# Patient Record
Sex: Female | Born: 1945
Health system: Southern US, Community
[De-identification: ages and names within clinical notes are randomized; demographics above are authoritative.]

## PROBLEM LIST (undated history)

## (undated) DIAGNOSIS — K219 Gastro-esophageal reflux disease without esophagitis: Secondary | ICD-10-CM

## (undated) DIAGNOSIS — I639 Cerebral infarction, unspecified: Secondary | ICD-10-CM

## (undated) DIAGNOSIS — J40 Bronchitis, not specified as acute or chronic: Secondary | ICD-10-CM

## (undated) DIAGNOSIS — I509 Heart failure, unspecified: Secondary | ICD-10-CM

## (undated) DIAGNOSIS — I270 Primary pulmonary hypertension: Secondary | ICD-10-CM

## (undated) DIAGNOSIS — I219 Acute myocardial infarction, unspecified: Secondary | ICD-10-CM

## (undated) DIAGNOSIS — Z8679 Personal history of other diseases of the circulatory system: Secondary | ICD-10-CM

## (undated) DIAGNOSIS — I2721 Secondary pulmonary arterial hypertension: Secondary | ICD-10-CM

## (undated) DIAGNOSIS — I209 Angina pectoris, unspecified: Secondary | ICD-10-CM

## (undated) DIAGNOSIS — G4730 Sleep apnea, unspecified: Secondary | ICD-10-CM

## (undated) DIAGNOSIS — R7303 Prediabetes: Secondary | ICD-10-CM

## (undated) DIAGNOSIS — J45909 Unspecified asthma, uncomplicated: Secondary | ICD-10-CM

## (undated) DIAGNOSIS — I10 Essential (primary) hypertension: Secondary | ICD-10-CM

## (undated) DIAGNOSIS — E669 Obesity, unspecified: Secondary | ICD-10-CM

## (undated) DIAGNOSIS — G4733 Obstructive sleep apnea (adult) (pediatric): Secondary | ICD-10-CM

## (undated) DIAGNOSIS — E7800 Pure hypercholesterolemia, unspecified: Secondary | ICD-10-CM

## (undated) DIAGNOSIS — I499 Cardiac arrhythmia, unspecified: Secondary | ICD-10-CM

## (undated) DIAGNOSIS — IMO0001 Reserved for inherently not codable concepts without codable children: Secondary | ICD-10-CM

## (undated) DIAGNOSIS — I2510 Atherosclerotic heart disease of native coronary artery without angina pectoris: Secondary | ICD-10-CM

## (undated) HISTORY — DX: Personal history of other diseases of the circulatory system: Z86.79

## (undated) HISTORY — DX: Unspecified asthma, uncomplicated: J45.909

## (undated) HISTORY — PX: CARPAL TUNNEL RELEASE: SHX101

## (undated) HISTORY — DX: Secondary pulmonary arterial hypertension: I27.21

## (undated) HISTORY — DX: Primary pulmonary hypertension: I27.0

## (undated) HISTORY — PX: JOINT REPLACEMENT: SHX530

## (undated) HISTORY — DX: Obstructive sleep apnea (adult) (pediatric): G47.33

## (undated) HISTORY — DX: Heart failure, unspecified: I50.9

## (undated) HISTORY — PX: CARDIAC CATHETERIZATION: SHX172

## (undated) HISTORY — PX: UVULOPALATOPHARYNGOPLASTY: SHX827

## (undated) HISTORY — PX: OTHER SURGICAL HISTORY: SHX169

## (undated) HISTORY — PX: KNEE ARTHROSCOPY AND ARTHROTOMY: SUR84

## (undated) HISTORY — PX: HYSTEROSCOPY: SHX211

## (undated) HISTORY — PX: DILATION AND CURETTAGE OF UTERUS: SHX78

## (undated) HISTORY — DX: Atherosclerotic heart disease of native coronary artery without angina pectoris: I25.10

## (undated) HISTORY — PX: TONSILLECTOMY: SUR1361

## (undated) HISTORY — PX: FOOT SURGERY: SHX648

---

## 1998-10-31 HISTORY — PX: COLONOSCOPY: SHX174

## 1998-10-31 LAB — HM COLONOSCOPY

## 2004-07-13 ENCOUNTER — Ambulatory Visit: Payer: Self-pay | Admitting: Cardiovascular Disease

## 2004-07-18 ENCOUNTER — Ambulatory Visit: Payer: Self-pay | Admitting: Cardiovascular Disease

## 2004-10-09 ENCOUNTER — Ambulatory Visit: Payer: Self-pay | Admitting: Obstetrics and Gynecology

## 2004-10-13 ENCOUNTER — Ambulatory Visit: Payer: Self-pay | Admitting: Obstetrics and Gynecology

## 2006-01-15 ENCOUNTER — Ambulatory Visit: Payer: Self-pay

## 2006-09-27 ENCOUNTER — Ambulatory Visit: Payer: Self-pay | Admitting: Internal Medicine

## 2007-08-06 ENCOUNTER — Ambulatory Visit: Payer: Self-pay | Admitting: Obstetrics and Gynecology

## 2007-08-08 ENCOUNTER — Ambulatory Visit: Payer: Self-pay | Admitting: Obstetrics and Gynecology

## 2007-10-10 ENCOUNTER — Other Ambulatory Visit: Payer: Self-pay

## 2007-10-11 ENCOUNTER — Inpatient Hospital Stay: Payer: Self-pay | Admitting: Internal Medicine

## 2007-10-11 ENCOUNTER — Other Ambulatory Visit: Payer: Self-pay

## 2008-04-05 ENCOUNTER — Ambulatory Visit: Payer: Self-pay | Admitting: Internal Medicine

## 2008-04-05 ENCOUNTER — Ambulatory Visit: Payer: Self-pay | Admitting: General Practice

## 2008-04-19 ENCOUNTER — Inpatient Hospital Stay: Payer: Self-pay | Admitting: General Practice

## 2009-05-12 ENCOUNTER — Emergency Department: Payer: Self-pay | Admitting: Emergency Medicine

## 2010-03-21 ENCOUNTER — Ambulatory Visit: Payer: Self-pay | Admitting: Internal Medicine

## 2011-05-17 ENCOUNTER — Ambulatory Visit: Payer: Self-pay | Admitting: Internal Medicine

## 2011-05-23 ENCOUNTER — Ambulatory Visit: Payer: Self-pay | Admitting: Internal Medicine

## 2011-06-13 ENCOUNTER — Ambulatory Visit: Payer: Self-pay | Admitting: Internal Medicine

## 2011-06-26 ENCOUNTER — Ambulatory Visit: Payer: Self-pay | Admitting: Internal Medicine

## 2011-08-24 ENCOUNTER — Ambulatory Visit: Payer: Self-pay | Admitting: Internal Medicine

## 2012-09-12 ENCOUNTER — Ambulatory Visit: Payer: Self-pay | Admitting: Internal Medicine

## 2013-09-17 ENCOUNTER — Ambulatory Visit: Payer: Self-pay | Admitting: Internal Medicine

## 2014-04-20 DIAGNOSIS — N950 Postmenopausal bleeding: Secondary | ICD-10-CM | POA: Insufficient documentation

## 2014-07-26 ENCOUNTER — Ambulatory Visit
Admit: 2014-07-26 | Disposition: A | Discharge status: Home / Self Care | Payer: Self-pay | Attending: Internal Medicine | Admitting: Internal Medicine

## 2014-07-30 DIAGNOSIS — I509 Heart failure, unspecified: Secondary | ICD-10-CM | POA: Insufficient documentation

## 2014-07-30 DIAGNOSIS — G4733 Obstructive sleep apnea (adult) (pediatric): Secondary | ICD-10-CM | POA: Insufficient documentation

## 2014-08-20 ENCOUNTER — Other Ambulatory Visit: Payer: Self-pay | Admitting: Internal Medicine

## 2014-08-20 DIAGNOSIS — Z1231 Encounter for screening mammogram for malignant neoplasm of breast: Secondary | ICD-10-CM

## 2014-09-20 ENCOUNTER — Ambulatory Visit
Admission: RE | Admit: 2014-09-20 | Discharge: 2014-09-20 | Disposition: A | Discharge status: Home / Self Care | Payer: Medicare HMO | Source: Ambulatory Visit | Attending: Internal Medicine | Admitting: Internal Medicine

## 2014-09-20 DIAGNOSIS — Z1231 Encounter for screening mammogram for malignant neoplasm of breast: Secondary | ICD-10-CM | POA: Diagnosis present

## 2014-11-30 DIAGNOSIS — I2510 Atherosclerotic heart disease of native coronary artery without angina pectoris: Secondary | ICD-10-CM | POA: Insufficient documentation

## 2014-11-30 DIAGNOSIS — I270 Primary pulmonary hypertension: Secondary | ICD-10-CM | POA: Insufficient documentation

## 2014-12-07 ENCOUNTER — Encounter: Payer: Self-pay | Admitting: *Deleted

## 2014-12-07 ENCOUNTER — Ambulatory Visit
Admission: RE | Admit: 2014-12-07 | Discharge: 2014-12-07 | Disposition: A | Discharge status: Home / Self Care | Payer: Medicare HMO | Source: Ambulatory Visit | Attending: Cardiology | Admitting: Cardiology

## 2014-12-07 ENCOUNTER — Encounter
Admission: RE | Disposition: A | Discharge status: Home / Self Care | Payer: Self-pay | Source: Ambulatory Visit | Attending: Cardiology

## 2014-12-07 DIAGNOSIS — Z7982 Long term (current) use of aspirin: Secondary | ICD-10-CM | POA: Diagnosis not present

## 2014-12-07 DIAGNOSIS — Z96652 Presence of left artificial knee joint: Secondary | ICD-10-CM | POA: Diagnosis not present

## 2014-12-07 DIAGNOSIS — Z79899 Other long term (current) drug therapy: Secondary | ICD-10-CM | POA: Insufficient documentation

## 2014-12-07 DIAGNOSIS — Z820 Family history of epilepsy and other diseases of the nervous system: Secondary | ICD-10-CM | POA: Diagnosis not present

## 2014-12-07 DIAGNOSIS — I10 Essential (primary) hypertension: Secondary | ICD-10-CM | POA: Diagnosis not present

## 2014-12-07 DIAGNOSIS — I509 Heart failure, unspecified: Secondary | ICD-10-CM | POA: Insufficient documentation

## 2014-12-07 DIAGNOSIS — G4733 Obstructive sleep apnea (adult) (pediatric): Secondary | ICD-10-CM | POA: Insufficient documentation

## 2014-12-07 DIAGNOSIS — G5600 Carpal tunnel syndrome, unspecified upper limb: Secondary | ICD-10-CM | POA: Diagnosis not present

## 2014-12-07 DIAGNOSIS — N950 Postmenopausal bleeding: Secondary | ICD-10-CM | POA: Insufficient documentation

## 2014-12-07 DIAGNOSIS — I482 Chronic atrial fibrillation: Secondary | ICD-10-CM | POA: Insufficient documentation

## 2014-12-07 DIAGNOSIS — Z8489 Family history of other specified conditions: Secondary | ICD-10-CM | POA: Diagnosis not present

## 2014-12-07 DIAGNOSIS — Z7901 Long term (current) use of anticoagulants: Secondary | ICD-10-CM | POA: Diagnosis not present

## 2014-12-07 DIAGNOSIS — Z833 Family history of diabetes mellitus: Secondary | ICD-10-CM | POA: Insufficient documentation

## 2014-12-07 DIAGNOSIS — Z6841 Body Mass Index (BMI) 40.0 and over, adult: Secondary | ICD-10-CM | POA: Diagnosis not present

## 2014-12-07 DIAGNOSIS — Z8673 Personal history of transient ischemic attack (TIA), and cerebral infarction without residual deficits: Secondary | ICD-10-CM | POA: Insufficient documentation

## 2014-12-07 DIAGNOSIS — E785 Hyperlipidemia, unspecified: Secondary | ICD-10-CM | POA: Insufficient documentation

## 2014-12-07 DIAGNOSIS — Z8619 Personal history of other infectious and parasitic diseases: Secondary | ICD-10-CM | POA: Insufficient documentation

## 2014-12-07 DIAGNOSIS — I272 Other secondary pulmonary hypertension: Secondary | ICD-10-CM | POA: Insufficient documentation

## 2014-12-07 DIAGNOSIS — Z90710 Acquired absence of both cervix and uterus: Secondary | ICD-10-CM | POA: Insufficient documentation

## 2014-12-07 DIAGNOSIS — Z9889 Other specified postprocedural states: Secondary | ICD-10-CM | POA: Diagnosis not present

## 2014-12-07 DIAGNOSIS — R079 Chest pain, unspecified: Secondary | ICD-10-CM | POA: Diagnosis present

## 2014-12-07 DIAGNOSIS — Z87891 Personal history of nicotine dependence: Secondary | ICD-10-CM | POA: Insufficient documentation

## 2014-12-07 HISTORY — DX: Essential (primary) hypertension: I10

## 2014-12-07 HISTORY — DX: Angina pectoris, unspecified: I20.9

## 2014-12-07 HISTORY — DX: Obesity, unspecified: E66.9

## 2014-12-07 HISTORY — DX: Pure hypercholesterolemia, unspecified: E78.00

## 2014-12-07 HISTORY — DX: Acute myocardial infarction, unspecified: I21.9

## 2014-12-07 HISTORY — DX: Reserved for inherently not codable concepts without codable children: IMO0001

## 2014-12-07 HISTORY — DX: Cerebral infarction, unspecified: I63.9

## 2014-12-07 HISTORY — PX: CARDIAC CATHETERIZATION: SHX172

## 2014-12-07 LAB — PROTIME-INR
INR: 1.14
PROTHROMBIN TIME: 14.8 s (ref 11.4–15.0)

## 2014-12-07 SURGERY — RIGHT AND LEFT HEART CATH
Anesthesia: Moderate Sedation | Site: Groin | Laterality: Right

## 2014-12-07 MED ORDER — MIDAZOLAM HCL 2 MG/2ML IJ SOLN
INTRAMUSCULAR | Status: DC | PRN
  Administered 2014-12-07 (×2): 0.5 mg via INTRAVENOUS

## 2014-12-07 MED ORDER — FENTANYL CITRATE (PF) 100 MCG/2ML IJ SOLN
INTRAMUSCULAR | Status: DC | PRN
  Administered 2014-12-07 (×2): 25 ug via INTRAVENOUS

## 2014-12-07 MED ORDER — HEPARIN (PORCINE) IN NACL 2-0.9 UNIT/ML-% IJ SOLN
INTRAMUSCULAR | Status: AC
  Filled 2014-12-07: qty 1000

## 2014-12-07 MED ORDER — SODIUM CHLORIDE 0.9 % IJ SOLN: 3.0000 mL | INTRAMUSCULAR | Status: DC | PRN

## 2014-12-07 MED ORDER — IOHEXOL 300 MG/ML  SOLN
INTRAMUSCULAR | Status: DC | PRN
Start: 2014-12-07 — End: 2014-12-07
  Administered 2014-12-07: 115 mL via INTRA_ARTERIAL

## 2014-12-07 MED ORDER — FENTANYL CITRATE (PF) 100 MCG/2ML IJ SOLN
INTRAMUSCULAR | Status: AC
  Filled 2014-12-07: qty 2

## 2014-12-07 MED ORDER — SODIUM CHLORIDE 0.9 % IV SOLN
INTRAVENOUS | Status: DC
  Administered 2014-12-07: 08:00:00 via INTRAVENOUS

## 2014-12-07 MED ORDER — SODIUM CHLORIDE 0.9 % IJ SOLN: 3.0000 mL | Freq: Two times a day (BID) | INTRAMUSCULAR | Status: DC

## 2014-12-07 MED ORDER — MIDAZOLAM HCL 2 MG/2ML IJ SOLN
INTRAMUSCULAR | Status: AC
  Filled 2014-12-07: qty 2

## 2014-12-07 MED ORDER — SODIUM CHLORIDE 0.9 % IV SOLN: 250.0000 mL | INTRAVENOUS | Status: DC | PRN

## 2014-12-07 MED ORDER — SODIUM CHLORIDE 0.9 % WEIGHT BASED INFUSION: 3.0000 mL/kg/h | INTRAVENOUS | Status: AC

## 2014-12-07 SURGICAL SUPPLY — 14 items
CATH INFINITI 5FR ANG PIGTAIL (CATHETERS) ×2 IMPLANT
CATH INFINITI 5FR JL4 (CATHETERS) ×2 IMPLANT
CATH INFINITI JR4 5F (CATHETERS) ×2 IMPLANT
CATH SWANZ 7F THERMO (CATHETERS) ×2 IMPLANT
DEVICE CLOSURE MYNXGRIP 5F (Vascular Products) ×2 IMPLANT
DEVICE CLOSURE MYNXGRIP 6/7F (Vascular Products) ×2 IMPLANT
GUIDEWIRE EMER 3M J .025X150CM (WIRE) ×2 IMPLANT
KIT MANI 3VAL PERCEP (MISCELLANEOUS) ×2 IMPLANT
KIT RIGHT HEART (MISCELLANEOUS) ×2 IMPLANT
NEEDLE PERC 18GX7CM (NEEDLE) ×2 IMPLANT
PACK CARDIAC CATH (CUSTOM PROCEDURE TRAY) ×2 IMPLANT
SHEATH AVANTI 5FR X 11CM (SHEATH) ×2 IMPLANT
SHEATH PINNACLE 7F 10CM (SHEATH) ×2 IMPLANT
WIRE EMERALD 3MM-J .035X150CM (WIRE) ×2 IMPLANT

## 2014-12-07 NOTE — Discharge Instructions (Signed)
The drugs you were given will stay in your system until tomorrow, so for the next 24 hours you should not.  Drive an automobile. Make any legal decisions.  Drink any alcoholic beverages.  Today you should start with liquids and gradually work up to solid foods as your are able to tolerate them  Resume your regular medications as prescribed by your doctor.   Change the Band-Aid or dressing as needed.  After 1 day no dressing is needed.  Avoid strenuous activity for the remainder of the day.  Please notify your primary physician immediately if you have any unusual bleeding, trouble breathing, fever >100 degrees or pain not relieved by the medication your doctor prescribed for your doctor prescribed for you physician

## 2014-12-08 ENCOUNTER — Encounter: Payer: Self-pay | Admitting: Cardiology

## 2015-01-05 ENCOUNTER — Other Ambulatory Visit: Payer: Self-pay | Admitting: Internal Medicine

## 2015-01-05 DIAGNOSIS — Z780 Asymptomatic menopausal state: Secondary | ICD-10-CM

## 2015-01-13 ENCOUNTER — Ambulatory Visit
Admission: RE | Admit: 2015-01-13 | Discharge: 2015-01-13 | Disposition: A | Discharge status: Home / Self Care | Payer: Medicare HMO | Source: Ambulatory Visit | Attending: Internal Medicine | Admitting: Internal Medicine

## 2015-01-13 DIAGNOSIS — Z780 Asymptomatic menopausal state: Secondary | ICD-10-CM | POA: Diagnosis present

## 2015-01-13 DIAGNOSIS — Z13820 Encounter for screening for osteoporosis: Secondary | ICD-10-CM | POA: Diagnosis present

## 2015-05-16 LAB — COLOGUARD: COLOGUARD: NEGATIVE

## 2015-08-12 ENCOUNTER — Other Ambulatory Visit: Payer: Self-pay | Admitting: Internal Medicine

## 2015-08-12 DIAGNOSIS — Z1231 Encounter for screening mammogram for malignant neoplasm of breast: Secondary | ICD-10-CM

## 2015-09-22 ENCOUNTER — Other Ambulatory Visit: Payer: Self-pay | Admitting: Internal Medicine

## 2015-09-22 ENCOUNTER — Ambulatory Visit
Admission: RE | Admit: 2015-09-22 | Discharge: 2015-09-22 | Disposition: A | Discharge status: Home / Self Care | Payer: Medicare HMO | Source: Ambulatory Visit | Attending: Internal Medicine | Admitting: Internal Medicine

## 2015-09-22 DIAGNOSIS — Z1231 Encounter for screening mammogram for malignant neoplasm of breast: Secondary | ICD-10-CM | POA: Insufficient documentation

## 2015-09-22 DIAGNOSIS — R928 Other abnormal and inconclusive findings on diagnostic imaging of breast: Secondary | ICD-10-CM | POA: Insufficient documentation

## 2015-09-26 ENCOUNTER — Other Ambulatory Visit: Payer: Self-pay | Admitting: Internal Medicine

## 2015-09-26 DIAGNOSIS — R928 Other abnormal and inconclusive findings on diagnostic imaging of breast: Secondary | ICD-10-CM

## 2015-10-13 ENCOUNTER — Ambulatory Visit
Admission: RE | Admit: 2015-10-13 | Discharge: 2015-10-13 | Disposition: A | Discharge status: Home / Self Care | Payer: Medicare HMO | Source: Ambulatory Visit | Attending: Internal Medicine | Admitting: Internal Medicine

## 2015-10-13 DIAGNOSIS — R921 Mammographic calcification found on diagnostic imaging of breast: Secondary | ICD-10-CM | POA: Diagnosis not present

## 2015-10-13 DIAGNOSIS — R928 Other abnormal and inconclusive findings on diagnostic imaging of breast: Secondary | ICD-10-CM

## 2015-10-20 ENCOUNTER — Other Ambulatory Visit: Payer: Self-pay | Admitting: Internal Medicine

## 2015-10-21 ENCOUNTER — Other Ambulatory Visit: Payer: Self-pay | Admitting: Internal Medicine

## 2015-10-21 DIAGNOSIS — R921 Mammographic calcification found on diagnostic imaging of breast: Secondary | ICD-10-CM

## 2015-10-26 ENCOUNTER — Inpatient Hospital Stay
Admission: RE | Admit: 2015-10-26 | Discharge: 2015-10-26 | Disposition: A | Discharge status: Home / Self Care | Payer: Medicare HMO | Source: Ambulatory Visit | Attending: Internal Medicine | Admitting: Internal Medicine

## 2015-11-02 ENCOUNTER — Ambulatory Visit: Admission: RE | Admit: 2015-11-02 | Payer: Medicare HMO | Source: Ambulatory Visit

## 2015-11-15 ENCOUNTER — Ambulatory Visit
Admission: RE | Admit: 2015-11-15 | Discharge: 2015-11-15 | Disposition: A | Discharge status: Home / Self Care | Payer: Medicare HMO | Source: Ambulatory Visit | Attending: Internal Medicine | Admitting: Internal Medicine

## 2015-11-15 DIAGNOSIS — N6001 Solitary cyst of right breast: Secondary | ICD-10-CM | POA: Diagnosis not present

## 2015-11-15 DIAGNOSIS — N6091 Unspecified benign mammary dysplasia of right breast: Secondary | ICD-10-CM | POA: Diagnosis not present

## 2015-11-15 DIAGNOSIS — R920 Mammographic microcalcification found on diagnostic imaging of breast: Secondary | ICD-10-CM | POA: Insufficient documentation

## 2015-11-15 DIAGNOSIS — N6081 Other benign mammary dysplasias of right breast: Secondary | ICD-10-CM | POA: Diagnosis not present

## 2015-11-15 DIAGNOSIS — R921 Mammographic calcification found on diagnostic imaging of breast: Secondary | ICD-10-CM | POA: Diagnosis present

## 2015-11-15 DIAGNOSIS — N6031 Fibrosclerosis of right breast: Secondary | ICD-10-CM | POA: Diagnosis not present

## 2015-11-15 HISTORY — PX: BREAST BIOPSY: SHX20

## 2015-11-15 NOTE — Discharge Instructions (Signed)
Stereotactic Breast Biopsy, Care After Refer to this sheet in the next few weeks. These instructions provide you with information on caring for yourself after your procedure. Your health care provider may also give you more specific instructions. Your treatment has been planned according to current medical practices, but problems sometimes occur. Call your health care provider if you have any problems or questions after your procedure. WHAT TO EXPECT AFTER THE PROCEDURE After your procedure, it is typical to have the following:  You may have a small scar from the cut (incision) made during the procedure (uncommon).  You may have bruising and soreness at the biopsy site. HOME CARE INSTRUCTIONS   Take medicines as directed by your health care provider.  Only take over-the-counter or prescription medicines for pain, discomfort, or fever as directed by your health care provider.   If the incision site is tender, applying an ice pack may relieve some tenderness. To do this:  Put ice in a plastic bag.  Place a towel between your skin and the bag.  Leave the ice on for 15-20 minutes, 3-4 times a day.  Care for the biopsy site as directed by your health care provider. Follow your health care provider's instructions for changing bandages (dressings).  Wear a good support bra for as long as your health care provider recommends.   Avoid strenuous activity for at least 24 hours, or as directed by your health care provider.   Follow up with your health care provider as directed. Make sure you get your test results. SEEK MEDICAL CARE IF:   You have a rash.   You have problems with your medicines.   You become lightheaded or dizzy.  SEEK IMMEDIATE MEDICAL CARE IF:   You have increased bleeding (more than a small amount) from the biopsy site.   You notice redness, swelling, or increasing pain where the incision was made during your procedure.  You see pus coming from the incision.    You have a fever.   You notice a bad smell coming from the incision or dressing.   You have shortness of breath.   You have chest pain.   You faint. MAKE SURE YOU:  Understand these instructions.  Will watch your condition.  Will get help right away if you are not doing well or get worse.   This information is not intended to replace advice given to you by your health care provider. Make sure you discuss any questions you have with your health care provider.   Document Released: 10/20/2004 Document Revised: 04/07/2013 Document Reviewed: 10/30/2012 Elsevier Interactive Patient Education [employer].

## 2015-11-17 LAB — SURGICAL PATHOLOGY

## 2015-11-22 ENCOUNTER — Encounter: Payer: Self-pay | Admitting: *Deleted

## 2015-11-23 ENCOUNTER — Encounter: Payer: Self-pay | Admitting: General Surgery

## 2015-11-23 ENCOUNTER — Ambulatory Visit (INDEPENDENT_AMBULATORY_CARE_PROVIDER_SITE_OTHER): Payer: Medicare HMO | Admitting: General Surgery

## 2015-11-23 VITALS — BP 138/74 | HR 76 | Resp 12 | Ht 65.0 in | Wt 330.0 lb

## 2015-11-23 DIAGNOSIS — D241 Benign neoplasm of right breast: Secondary | ICD-10-CM

## 2015-11-23 DIAGNOSIS — T888XXA Other specified complications of surgical and medical care, not elsewhere classified, initial encounter: Secondary | ICD-10-CM

## 2015-11-23 NOTE — Progress Notes (Signed)
Patient ID: Michelle Jenkins, female   DOB: 23-Jun-1945, 70 y.o.   MRN: [id number]  Chief Complaint  Patient presents with  . Breast Problem    HPI Michelle Jenkins is a 70 y.o. female who presents for a breast evaluation following a stereotactic right breast biopsy (10-11OCL position, 11-18-15) showing a complex sclerosing lesion/ papilloma. The most recent mammogram on 11-18-15. Patient does not perform regular self breast checks and gets regular mammograms done.  She could not feel anything different in the breast. It is unclear if patient had stopped taking her coumadin prior to the biopsy. She states she is bruised from the breast biopsy. 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   I have reviewed the history of present illness with the patient.    HPI  Past Medical History:  Diagnosis Date  . Anginal pain ([place])   . CVA (cerebral infarction)   . Hypercholesteremia   . Hypertension   . Myocardial infarction ([place])   . Obesity   . Shortness of breath dyspnea     Past Surgical History:  Procedure Laterality Date  . BREAST BIOPSY Right 11/15/2015   stereo, COMPLEX SCLEROSING LESION WITH INTRADUCTAL PAPILLOMA COMPONENT AND   . CARDIAC CATHETERIZATION    . CARDIAC CATHETERIZATION Right 12/07/2014   Procedure: Right and Left Heart Cath with Coronary Angiography;  Surgeon: Teodoro Spray, MD;  Location: [facility];  Service: Cardiovascular;  Laterality: Right;  . DILATION AND CURETTAGE OF UTERUS    . KNEE ARTHROSCOPY AND ARTHROTOMY Right   . lt knee replacement    . TONSILLECTOMY      Family History  Problem Relation Age of Onset  . Throat cancer Brother   . Cancer Daughter 70    metastatic uterine PECOMA to the liver and lungs    Social History Social History  Substance Use Topics  . Smoking status: Former Smoker    Quit date: 06/15/2014  . Smokeless tobacco: Never Used  . Alcohol use 0.6 oz/week    1  Glasses of wine per week     Comment: occasionally    Allergies  Allergen Reactions  . Nyquil Multi-Symptom [Pseudoeph-Doxylamine-Dm-Apap]   . Tylenol [Acetaminophen]     Current Outpatient Prescriptions  Medication Sig Dispense Refill  . ambrisentan (LETAIRIS) 5 MG tablet Take 5 mg by mouth daily.     Marland Kitchen aspirin 81 MG tablet Take 81 mg by mouth daily.    Marland Kitchen ezetimibe (ZETIA) 10 MG tablet Take 10 mg by mouth daily.    . furosemide (LASIX) 20 MG tablet Take 40 mg by mouth.    . labetalol (NORMODYNE) 200 MG tablet Take 200 mg by mouth 2 (two) times daily.    . mometasone (NASONEX) 50 MCG/ACT nasal spray Place 2 sprays into the nose as needed.    Marland Kitchen olmesartan-hydrochlorothiazide (BENICAR HCT) 40-25 MG per tablet Take 1 tablet by mouth daily.    . Olopatadine HCl (PATADAY) 0.2 % SOLN Apply 1 drop to eye every morning.    . pantoprazole (PROTONIX) 40 MG tablet Take 40 mg by mouth daily.    . simvastatin (ZOCOR) 40 MG tablet Take 40 mg by mouth daily.    Marland Kitchen warfarin (COUMADIN) 7.5 MG tablet Take 7.5 mg by mouth daily.    . Mometasone Furoate POWD Use 1 drop once daily.     No current facility-administered medications for this visit.  Review of Systems Review of Systems  Constitutional: Negative.   Respiratory: Negative.   Cardiovascular: Negative.     Blood pressure 138/74, pulse 76, resp. rate 12, height 5\' 5"  (1.651 m), weight (!) 330 lb (149.7 kg).  Physical Exam Physical Exam  Constitutional: She is oriented to person, place, and time. She appears well-developed and well-nourished.  Eyes: Conjunctivae are normal. No scleral icterus.  Neck: Neck supple.  Cardiovascular: Normal rate, regular rhythm and normal heart sounds.   Pulmonary/Chest: Effort normal and breath sounds normal. Right breast exhibits skin change and tenderness. Right breast exhibits no inverted nipple, no mass and no nipple discharge. Left breast exhibits no inverted nipple, no mass, no nipple discharge, no  skin change and no tenderness.    10 cm hematoma right upper quadrant at breast biopsy site. Ecchymosis at inferior aspect right breast.  Abdominal: Soft. There is no tenderness.  Lymphadenopathy:    She has no cervical adenopathy.    She has no axillary adenopathy.  Neurological: She is alert and oriented to person, place, and time.  Skin: Skin is warm and dry.  Psychiatric: Her behavior is normal.    Data Reviewed Prior notes, radiology report, pathology report  Assessment    Large hematoma right breast, s/p stereotactic biopsy. This likely will need to be drained. First, the patient has been asked to stop the coumadin for reassessment in 4-5 days. Intraductal papilloma and complex sclerosing lesion on biopsy report History of stroke, AMI    Plan    Patient must have resolution of the hematoma prior to lumpectomy. The clip from the biopsy is likely within the hematoma and removal of the hematoma would result in the loss of the clip. Patient was instructed to stop the coumadin, wear a supportive bra and use heat to allow the hematoma some time to resolve. The need for excision will be dealt with after the hematoma resolves   Follow up on Monday       This information has been scribed by Karie Fetch RN, BSN,BC.  SANKAR,SEEPLAPUTHUR G 11/24/2015, 9:40 AM

## 2015-11-23 NOTE — Patient Instructions (Addendum)
STOP coumadin Apply heat to the area 3-4 times per day Wear a tight, supportive bra Follow up on Monday  Hematoma A hematoma is a collection of blood. The collection of blood can turn into a hard, painful lump under the skin. Your skin may turn blue or yellow if the hematoma is close to the surface of the skin. Most hematomas get better in a few days to weeks. Some hematomas are serious and need medical care. Hematomas can be very small or very big. HOME CARE  Apply ice to the injured area:  Put ice in a plastic bag.  Place a towel between your skin and the bag.  Leave the ice on for 20 minutes, 2-3 times a day for the first 1 to 2 days.  After the first 2 days, switch to using warm packs on the injured area.  Raise (elevate) the injured area to lessen pain and puffiness (swelling). You may also wrap the area with an elastic bandage. Make sure the bandage is not wrapped too tight.  If you have a painful hematoma on your leg or foot, you may use crutches for a couple days.  Only take medicines as told by your doctor. GET HELP RIGHT AWAY IF:   Your pain gets worse.  Your pain is not controlled with medicine.  You have a fever.  Your puffiness gets worse.  Your skin turns more blue or yellow.  Your skin over the hematoma breaks or starts bleeding.  Your hematoma is in your chest or belly (abdomen) and you are short of breath, feel weak, or have a change in consciousness.  Your hematoma is on your scalp and you have a headache that gets worse or a change in alertness or consciousness. MAKE SURE YOU:   Understand these instructions.  Will watch your condition.  Will get help right away if you are not doing well or get worse.   This information is not intended to replace advice given to you by your health care provider. Make sure you discuss any questions you have with your health care provider.   Document Released: 05/10/2004 Document Revised: 12/03/2012 Document  Reviewed: 09/10/2012 Elsevier Interactive Patient Education [employer].  The patient is aware to call back for any questions or concerns.

## 2015-11-24 ENCOUNTER — Encounter: Payer: Self-pay | Admitting: General Surgery

## 2015-11-28 ENCOUNTER — Encounter: Payer: Self-pay | Admitting: General Surgery

## 2015-11-28 ENCOUNTER — Ambulatory Visit (INDEPENDENT_AMBULATORY_CARE_PROVIDER_SITE_OTHER): Payer: Medicare HMO | Admitting: General Surgery

## 2015-11-28 VITALS — BP 132/68 | HR 74 | Ht 65.0 in | Wt 329.0 lb

## 2015-11-28 DIAGNOSIS — D241 Benign neoplasm of right breast: Secondary | ICD-10-CM

## 2015-11-28 DIAGNOSIS — T888XXA Other specified complications of surgical and medical care, not elsewhere classified, initial encounter: Secondary | ICD-10-CM | POA: Diagnosis not present

## 2015-11-28 NOTE — Progress Notes (Signed)
Patient ID: Michelle Jenkins, female   DOB: Aug 23, 1945, 70 y.o.   MRN: [id number]  Chief Complaint  Patient presents with  . Follow-up    right breast hematoma    HPI Michelle Jenkins is a 70 y.o. female here today for a follow up on a right breast hematoma she developed follow a traumatic breast biopsy while on coumadin. She states there is some dried blood occassionally in the morning on her bra. She has not taken her coumadin since her last visit. I have reviewed the history of present illness with the patient.  HPI  Past Medical History:  Diagnosis Date  . Anginal pain ([place])   . CVA (cerebral infarction)   . Hypercholesteremia   . Hypertension   . Myocardial infarction ([place])   . Obesity   . Shortness of breath dyspnea     Past Surgical History:  Procedure Laterality Date  . BREAST BIOPSY Right 11/15/2015   stereo, COMPLEX SCLEROSING LESION WITH INTRADUCTAL PAPILLOMA COMPONENT AND   . CARDIAC CATHETERIZATION    . CARDIAC CATHETERIZATION Right 12/07/2014   Procedure: Right and Left Heart Cath with Coronary Angiography;  Surgeon: Teodoro Spray, MD;  Location: [facility];  Service: Cardiovascular;  Laterality: Right;  . DILATION AND CURETTAGE OF UTERUS    . KNEE ARTHROSCOPY AND ARTHROTOMY Right   . lt knee replacement    . TONSILLECTOMY      Family History  Problem Relation Age of Onset  . Throat cancer Brother   . Cancer Daughter 45    metastatic uterine PECOMA to the liver and lungs    Social History Social History  Substance Use Topics  . Smoking status: Former Smoker    Quit date: 06/15/2014  . Smokeless tobacco: Never Used  . Alcohol use 0.6 oz/week    1 Glasses of wine per week     Comment: occasionally    Allergies  Allergen Reactions  . Nyquil Multi-Symptom [Pseudoeph-Doxylamine-Dm-Apap]   . Tylenol [Acetaminophen]     Current Outpatient Prescriptions  Medication Sig Dispense Refill  . ambrisentan (LETAIRIS) 5 MG tablet Take 5 mg by mouth  daily.     Marland Kitchen aspirin 81 MG tablet Take 81 mg by mouth daily.    Marland Kitchen ezetimibe (ZETIA) 10 MG tablet Take 10 mg by mouth daily.    . furosemide (LASIX) 20 MG tablet Take 40 mg by mouth.    . labetalol (NORMODYNE) 200 MG tablet Take 200 mg by mouth 2 (two) times daily.    . mometasone (NASONEX) 50 MCG/ACT nasal spray Place 2 sprays into the nose as needed.    . Mometasone Furoate POWD Use 1 drop once daily.    Marland Kitchen olmesartan-hydrochlorothiazide (BENICAR HCT) 40-25 MG per tablet Take 1 tablet by mouth daily.    . Olopatadine HCl (PATADAY) 0.2 % SOLN Apply 1 drop to eye every morning.    . pantoprazole (PROTONIX) 40 MG tablet Take 40 mg by mouth daily.    . simvastatin (ZOCOR) 40 MG tablet Take 40 mg by mouth daily.    Marland Kitchen warfarin (COUMADIN) 7.5 MG tablet Take 7.5 mg by mouth daily.     No current facility-administered medications for this visit.     Review of Systems Review of Systems  Constitutional: Negative.   Respiratory: Negative.   Cardiovascular: Negative.     Blood pressure 132/68, pulse 74, height 5\' 5"  (1.651 m), weight (!) 329 lb (149.2 kg).  Physical Exam Physical Exam  Constitutional:  She is oriented to person, place, and time.  Pulmonary/Chest: Right breast exhibits skin change and tenderness. Right breast exhibits no inverted nipple, no mass and no nipple discharge. Left breast exhibits no inverted nipple, no mass, no nipple discharge, no skin change and no tenderness.    Neurological: She is alert and oriented to person, place, and time.  Skin: Skin is warm and dry.    Data Reviewed Previous notes  Assessment    Right breast hematoma    Plan    Drain right breast hematoma Do not restart coumadin.  It is possible that the clip from the lumpectomy was within the clotted blood removed from the hematoma. A mammogram will be scheduled following complete resolution of the hematoma.  Procedure: Incision and drainage of right breast hematoma  Anesthetic:  59mL 0.5%  marcaine mixed with 1% xylocaine  Prep:  Chloroprep  Description: Following consent, patient positioned herself to be supine on the examination table and  66mL 0.5% marcaine mixed with 1% xylocaine was instilled in the area surrounding the right breast hematoma. A 1cm cruciate incision was made at the hematoma drainage site. A kelly clamp was used to probe the hematoma and clear the area of clotted blood and thick, dark blood. Approximately 68mL of clot was removed from the hematoma. The area was cleaned, dressed with gauze and  paper tape. Patient was provided an icepack to assist with swelling and pain.      Patient will return in one week  This information has been scribed by Verlene Mayer, CMA    Junie Panning G 11/28/2015, 3:35 PM

## 2015-11-28 NOTE — Patient Instructions (Signed)
Keep area clean and dry. °

## 2015-12-08 ENCOUNTER — Encounter: Payer: Self-pay | Admitting: General Surgery

## 2015-12-08 ENCOUNTER — Ambulatory Visit (INDEPENDENT_AMBULATORY_CARE_PROVIDER_SITE_OTHER): Payer: Medicare HMO | Admitting: General Surgery

## 2015-12-08 VITALS — BP 138/72 | HR 74 | Resp 18 | Ht 65.0 in | Wt 333.0 lb

## 2015-12-08 DIAGNOSIS — D241 Benign neoplasm of right breast: Secondary | ICD-10-CM

## 2015-12-08 DIAGNOSIS — T888XXA Other specified complications of surgical and medical care, not elsewhere classified, initial encounter: Secondary | ICD-10-CM

## 2015-12-08 NOTE — Patient Instructions (Addendum)
The patient is aware to call back for any questions or concerns. May restart Coumadin Use heat to the area as needed for comfort.

## 2015-12-08 NOTE — Progress Notes (Signed)
Patient ID: Michelle Jenkins, female   DOB: [birth year], 70 y.o.   MRN: [id number]  Chief Complaint  Patient presents with  . Follow-up    HPI Michelle Jenkins is a 70 y.o. female.  here today for a follow up on a right breast hematoma she developed following a stereotactic breast biopsy while on coumadin. She is currently off her coumadin. She states the breast is softer. I have reviewed the history of present illness with the patient.  HPI  Past Medical History:  Diagnosis Date  . Anginal pain ([place])   . CVA (cerebral infarction)   . Hypercholesteremia   . Hypertension   . Myocardial infarction ([place])   . Obesity   . Shortness of breath dyspnea     Past Surgical History:  Procedure Laterality Date  . BREAST BIOPSY Right 11/15/2015   stereo, COMPLEX SCLEROSING LESION WITH INTRADUCTAL PAPILLOMA COMPONENT AND   . CARDIAC CATHETERIZATION    . CARDIAC CATHETERIZATION Right 12/07/2014   Procedure: Right and Left Heart Cath with Coronary Angiography;  Surgeon: Teodoro Spray, MD;  Location: [facility];  Service: Cardiovascular;  Laterality: Right;  . DILATION AND CURETTAGE OF UTERUS    . KNEE ARTHROSCOPY AND ARTHROTOMY Right   . lt knee replacement    . TONSILLECTOMY      Family History  Problem Relation Age of Onset  . Throat cancer Brother   . Cancer Daughter 71    metastatic uterine PECOMA to the liver and lungs    Social History Social History  Substance Use Topics  . Smoking status: Former Smoker    Quit date: 06/15/2014  . Smokeless tobacco: Never Used  . Alcohol use 0.6 oz/week    1 Glasses of wine per week     Comment: occasionally    Allergies  Allergen Reactions  . Nyquil Multi-Symptom [Pseudoeph-Doxylamine-Dm-Apap]   . Tylenol [Acetaminophen]     Current Outpatient Prescriptions  Medication Sig Dispense Refill  . ambrisentan (LETAIRIS) 5 MG tablet Take 5 mg by mouth daily.     Marland Kitchen aspirin 81 MG tablet Take 81 mg by mouth daily.    Marland Kitchen ezetimibe  (ZETIA) 10 MG tablet Take 10 mg by mouth daily.    . furosemide (LASIX) 20 MG tablet Take 40 mg by mouth.    . labetalol (NORMODYNE) 200 MG tablet Take 200 mg by mouth 2 (two) times daily.    . mometasone (NASONEX) 50 MCG/ACT nasal spray Place 2 sprays into the nose as needed.    . Mometasone Furoate POWD Use 1 drop once daily.    Marland Kitchen olmesartan-hydrochlorothiazide (BENICAR HCT) 40-25 MG per tablet Take 1 tablet by mouth daily.    . Olopatadine HCl (PATADAY) 0.2 % SOLN Apply 1 drop to eye every morning.    . pantoprazole (PROTONIX) 40 MG tablet Take 40 mg by mouth daily.    . simvastatin (ZOCOR) 40 MG tablet Take 40 mg by mouth daily.    Marland Kitchen warfarin (COUMADIN) 7.5 MG tablet Take 7.5 mg by mouth daily.     No current facility-administered medications for this visit.     Review of Systems Review of Systems  Constitutional: Negative.   Respiratory: Positive for shortness of breath.   Cardiovascular: Negative.     Blood pressure 138/72, pulse 74, resp. rate 18, height 5\' 5"  (1.651 m), weight (!) 333 lb (151 kg).  Physical Exam Physical Exam  Constitutional: She is oriented to person, place, and time. She appears  well-developed and well-nourished.  HENT:  Mouth/Throat: Oropharynx is clear and moist.  Pulmonary/Chest:  Hematoma showing good progress, currently 5-6 cm in size with mostly induration and very little fluctuation. No drainage or signs of infection.  Neurological: She is alert and oriented to person, place, and time.  Skin: Skin is warm and dry.  Psychiatric: Her behavior is normal.    Data Reviewed    Assessment    Hematoma showing good resolution.    Plan    Use heat to the area as needed for comfort. May resume Coumadin.  Follow up in 4 weeks with office visit.    This information has been scribed by Karie Fetch RN, BSN,BC.    SANKAR,SEEPLAPUTHUR G 12/12/2015, 10:14 AM

## 2015-12-12 ENCOUNTER — Encounter: Payer: Self-pay | Admitting: General Surgery

## 2016-01-05 ENCOUNTER — Ambulatory Visit: Payer: Medicare HMO | Admitting: General Surgery

## 2016-01-19 ENCOUNTER — Ambulatory Visit (INDEPENDENT_AMBULATORY_CARE_PROVIDER_SITE_OTHER): Payer: Medicare HMO | Admitting: General Surgery

## 2016-01-19 ENCOUNTER — Encounter: Payer: Self-pay | Admitting: General Surgery

## 2016-01-19 ENCOUNTER — Inpatient Hospital Stay: Payer: Self-pay

## 2016-01-19 VITALS — BP 132/68 | HR 70 | Resp 16 | Ht 65.0 in | Wt 331.0 lb

## 2016-01-19 DIAGNOSIS — D241 Benign neoplasm of right breast: Secondary | ICD-10-CM | POA: Diagnosis not present

## 2016-01-19 DIAGNOSIS — S2001XD Contusion of right breast, subsequent encounter: Secondary | ICD-10-CM

## 2016-01-19 NOTE — Patient Instructions (Addendum)
Follow up after right breast diagnostic mammogram. The patient is aware to call back for any questions or concerns.

## 2016-01-19 NOTE — Progress Notes (Signed)
Patient ID: Michelle Jenkins, female   DOB: July 30, 1945, 70 y.o.   MRN: [id number]  Chief Complaint  Patient presents with  . Follow-up    HPI Michelle Jenkins is a 70 y.o. female here today for a follow up on a right breast hematoma she developed following a traumatic breast stereotatic biopsy while on coumadin. Right breast biopsy showed intraductal papilloma.  She states the area is much smaller than before, but still hard.  I have reviewed the history of present illness with the patient.  HPI  Past Medical History:  Diagnosis Date  . Anginal pain ([place])   . CVA (cerebral infarction)   . Hypercholesteremia   . Hypertension   . Myocardial infarction   . Obesity   . Shortness of breath dyspnea     Past Surgical History:  Procedure Laterality Date  . BREAST BIOPSY Right 11/15/2015   stereo, COMPLEX SCLEROSING LESION WITH INTRADUCTAL PAPILLOMA COMPONENT AND   . CARDIAC CATHETERIZATION    . CARDIAC CATHETERIZATION Right 12/07/2014   Procedure: Right and Left Heart Cath with Coronary Angiography;  Surgeon: Teodoro Spray, MD;  Location: [facility];  Service: Cardiovascular;  Laterality: Right;  . DILATION AND CURETTAGE OF UTERUS    . KNEE ARTHROSCOPY AND ARTHROTOMY Right   . lt knee replacement    . TONSILLECTOMY      Family History  Problem Relation Age of Onset  . Throat cancer Brother   . Cancer Daughter 55    metastatic uterine PECOMA to the liver and lungs    Social History Social History  Substance Use Topics  . Smoking status: Former Smoker    Quit date: 06/15/2014  . Smokeless tobacco: Never Used  . Alcohol use 0.6 oz/week    1 Glasses of wine per week     Comment: occasionally    Allergies  Allergen Reactions  . Nyquil Multi-Symptom [Pseudoeph-Doxylamine-Dm-Apap]   . Tylenol [Acetaminophen]     Current Outpatient Prescriptions  Medication Sig Dispense Refill  . ambrisentan (LETAIRIS) 5 MG tablet Take 5 mg by mouth daily.     Marland Kitchen aspirin 81 MG  tablet Take 81 mg by mouth daily.    Marland Kitchen ezetimibe (ZETIA) 10 MG tablet Take 10 mg by mouth daily.    . furosemide (LASIX) 20 MG tablet Take 40 mg by mouth.    . labetalol (NORMODYNE) 200 MG tablet Take 200 mg by mouth 2 (two) times daily.    . mometasone (NASONEX) 50 MCG/ACT nasal spray Place 2 sprays into the nose as needed.    . Mometasone Furoate POWD Use 1 drop once daily.    Marland Kitchen olmesartan-hydrochlorothiazide (BENICAR HCT) 40-25 MG per tablet Take 1 tablet by mouth daily.    . Olopatadine HCl (PATADAY) 0.2 % SOLN Apply 1 drop to eye every morning.    . pantoprazole (PROTONIX) 40 MG tablet Take 40 mg by mouth daily.    . simvastatin (ZOCOR) 40 MG tablet Take 40 mg by mouth daily.    Marland Kitchen warfarin (COUMADIN) 7.5 MG tablet Take 7.5 mg by mouth daily.     No current facility-administered medications for this visit.     Review of Systems Review of Systems  Constitutional: Negative.   Respiratory: Negative.   Cardiovascular: Negative.     Blood pressure 132/68, pulse 70, resp. rate 16, height 5\' 5"  (1.651 m), weight (!) 331 lb (150.1 kg).  Physical Exam Physical Exam  Constitutional: She is oriented to person, place, and  time. She appears well-developed and well-nourished.  Pulmonary/Chest:    Neurological: She is alert and oriented to person, place, and time.  Skin: Skin is warm and dry.  Psychiatric: Her behavior is normal.    Data Reviewed  Bedside US shows 3-4 cm mass with heterogenous appearance- no fluid, suggestive of resolving hematoma  Assessment    Intraductal papilloma with hematoma, post stereotatic biopsy, does not require drainage at this time. Hematoma has been slowly decreasing in size.    Plan    Right breast diagnostic mammogram to determine clip placement and possibility for removal of intraductal papilloma.. Follow up after imaging.      This information has been scribed by Michelle Fetch RN, BSN,BC.  Briasia Flinders G 01/19/2016, 12:13 PM

## 2016-02-03 ENCOUNTER — Other Ambulatory Visit: Payer: Self-pay | Admitting: General Surgery

## 2016-02-03 ENCOUNTER — Ambulatory Visit
Admission: RE | Admit: 2016-02-03 | Discharge: 2016-02-03 | Disposition: A | Discharge status: Home / Self Care | Payer: Medicare HMO | Source: Ambulatory Visit | Attending: General Surgery | Admitting: General Surgery

## 2016-02-03 DIAGNOSIS — R921 Mammographic calcification found on diagnostic imaging of breast: Secondary | ICD-10-CM | POA: Insufficient documentation

## 2016-02-03 DIAGNOSIS — D241 Benign neoplasm of right breast: Secondary | ICD-10-CM

## 2016-02-06 ENCOUNTER — Ambulatory Visit (INDEPENDENT_AMBULATORY_CARE_PROVIDER_SITE_OTHER): Payer: Medicare HMO | Admitting: General Surgery

## 2016-02-06 ENCOUNTER — Other Ambulatory Visit: Payer: Self-pay | Admitting: General Surgery

## 2016-02-06 ENCOUNTER — Encounter: Payer: Self-pay | Admitting: General Surgery

## 2016-02-06 VITALS — BP 142/76 | HR 80 | Resp 16 | Ht 66.0 in | Wt 334.0 lb

## 2016-02-06 DIAGNOSIS — D241 Benign neoplasm of right breast: Secondary | ICD-10-CM

## 2016-02-06 NOTE — Patient Instructions (Signed)
Patient to stop her coumadin for 5 days before surgery. Right breast excision .

## 2016-02-06 NOTE — Progress Notes (Addendum)
Patient ID: Michelle Jenkins, female   DOB: 1945/07/08, 70 y.o.   MRN: [id number]  Chief Complaint  Patient presents with  . Follow-up    mammogram    HPI Michelle Jenkins is a 70 y.o. female who presents for a breast evaluation. The most recent mammogram was done on 02/03/16 .  Patient does perform regular self breast checks and gets regular mammograms done. In August of this yr she had stereotactic biopsy of an area of calcifications in uoq right breast- complex sclerosing lesion with a intraductal papilloma.  At that time she developed a large(10cm) hematoma that had to be drained.  I have reviewed the history of present illness with the patient.   HPI  Past Medical History:  Diagnosis Date  . Anginal pain ([place])   . Bronchitis   . CVA (cerebral infarction)   . Dysrhythmia   . GERD (gastroesophageal reflux disease)   . Hypercholesteremia   . Hypertension   . Myocardial infarction   . Obesity   . Pre-diabetes   . Shortness of breath dyspnea   . Sleep apnea   . Stroke [facility])     Past Surgical History:  Procedure Laterality Date  . BREAST BIOPSY Right 11/15/2015   stereo, COMPLEX SCLEROSING LESION WITH INTRADUCTAL PAPILLOMA COMPONENT AND   . CARDIAC CATHETERIZATION    . CARDIAC CATHETERIZATION Right 12/07/2014   Procedure: Right and Left Heart Cath with Coronary Angiography;  Surgeon: Teodoro Spray, MD;  Location: [place] [facility];  Service: Cardiovascular;  Laterality: Right;  . DILATION AND CURETTAGE OF UTERUS    . JOINT REPLACEMENT Left   . KNEE ARTHROSCOPY AND ARTHROTOMY Right   . lt knee replacement    . TONSILLECTOMY      Family History  Problem Relation Age of Onset  . Throat cancer Brother   . Cancer Daughter 28    metastatic uterine PECOMA to the liver and lungs    Social History Social History  Substance Use Topics  . Smoking status: Former Smoker    Quit date: 06/15/2014  . Smokeless tobacco: Never Used  . Alcohol use 0.0 - 0.6 oz/week     Comment:  occasionally    Allergies  Allergen Reactions  . Nyquil Multi-Symptom [Pseudoeph-Doxylamine-Dm-Apap] Itching  . Tylenol [Acetaminophen] Itching    Current Outpatient Prescriptions  Medication Sig Dispense Refill  . ambrisentan (LETAIRIS) 5 MG tablet Take 5 mg by mouth daily.     Marland Kitchen aspirin 81 MG tablet Take 81 mg by mouth daily.    . furosemide (LASIX) 20 MG tablet Take 40 mg by mouth daily.     Marland Kitchen labetalol (NORMODYNE) 200 MG tablet Take 200 mg by mouth 2 (two) times daily.    . mometasone (NASONEX) 50 MCG/ACT nasal spray Place 2 sprays into the nose daily as needed.     Marland Kitchen olmesartan-hydrochlorothiazide (BENICAR HCT) 40-25 MG per tablet Take 1 tablet by mouth daily.    . Olopatadine HCl (PATADAY) 0.2 % SOLN Place 1 drop into both eyes daily as needed.     . pantoprazole (PROTONIX) 40 MG tablet Take 40 mg by mouth daily.    . simvastatin (ZOCOR) 40 MG tablet Take 40 mg by mouth daily at 6 PM.     . warfarin (COUMADIN) 7.5 MG tablet Take 7.5 mg by mouth daily.    . traMADol (ULTRAM) 50 MG tablet Take 50 mg by mouth every 6 (six) hours as needed for moderate pain.  No current facility-administered medications for this visit.     Review of Systems Review of Systems  Constitutional: Negative.   Respiratory: Negative.     Blood pressure (!) 142/76, pulse 80, resp. rate 16, height 5\' 6"  (1.676 m), weight (!) 334 lb (151.5 kg).  Physical Exam Physical Exam  Constitutional: She is oriented to person, place, and time. She appears well-developed and well-nourished.  Eyes: Conjunctivae are normal. No scleral icterus.  Neck: Neck supple.  Cardiovascular: Normal rate, regular rhythm and normal heart sounds.   Pulmonary/Chest: Effort normal and breath sounds normal. Right breast exhibits mass. Right breast exhibits no inverted nipple, no nipple discharge, no skin change and no tenderness. Left breast exhibits no inverted nipple, no mass, no nipple discharge, no skin change and no  tenderness.    Abdominal: Soft. Bowel sounds are normal. There is no tenderness.  Lymphadenopathy:    She has no cervical adenopathy.    She has no axillary adenopathy.  Neurological: She is alert and oriented to person, place, and time.  Skin: Skin is warm and dry.    Data Reviewed Mammogram reviewed. The clip is still in. Some residual calcifications are noted. The density from the hematoma is separate from the clip area  Assessment  Complex sclerosing lesion right breast with intraductal papilloma. Residual calcifications noted in area of biopsy. This lesion warrants complete excision with margins in case path reveals DCIS or invasive CA    Plan   Recommended complete excision of the area of biopsy and at same time the residual hematoma. Pt is agreeable.     Patient to stop her coumadin for 5 days before surgery. Right breast lumpectomy  This patient's surgery has been scheduled for 02-13-16 at [facility]. It is okay for patient to continue 81 mg aspirin once daily.   This information has been scribed by Gaspar Cola CMA.   Tanielle Emigh G 02/21/2016, 9:06 AM

## 2016-02-09 ENCOUNTER — Other Ambulatory Visit: Payer: Medicare HMO

## 2016-02-13 ENCOUNTER — Encounter: Payer: Self-pay | Admitting: *Deleted

## 2016-02-13 ENCOUNTER — Ambulatory Visit: Payer: Medicare HMO

## 2016-02-13 ENCOUNTER — Ambulatory Visit: Admit: 2016-02-13 | Payer: Medicare HMO | Admitting: General Surgery

## 2016-02-13 SURGERY — BREAST LUMPECTOMY WITH NEEDLE LOCALIZATION
Anesthesia: Choice | Laterality: Right

## 2016-02-13 NOTE — Progress Notes (Signed)
Patient's surgery has been rescheduled for 02-27-16 at [facility]. She is aware of date, time, and instructions.

## 2016-02-20 ENCOUNTER — Encounter
Admission: RE | Admit: 2016-02-20 | Discharge: 2016-02-20 | Disposition: A | Discharge status: Home / Self Care | Payer: Medicare HMO | Source: Ambulatory Visit | Attending: General Surgery | Admitting: General Surgery

## 2016-02-20 HISTORY — DX: Prediabetes: R73.03

## 2016-02-20 HISTORY — DX: Bronchitis, not specified as acute or chronic: J40

## 2016-02-20 HISTORY — DX: Cerebral infarction, unspecified: I63.9

## 2016-02-20 HISTORY — DX: Sleep apnea, unspecified: G47.30

## 2016-02-20 HISTORY — DX: Gastro-esophageal reflux disease without esophagitis: K21.9

## 2016-02-20 HISTORY — DX: Cardiac arrhythmia, unspecified: I49.9

## 2016-02-20 NOTE — Pre-Procedure Instructions (Addendum)
Pt reports she saw Dr. Raul Del last week, was diagnosed with bronchitis, is not taking the "blue pill" and is still coughing.  Per Dr.  Gust Brooms note in epic, pt was instructed to take mucinex, delsym and doxycycline for respiratory infection.   Spoke with Sharyn Lull at Dr. Angie Fava office, we will need a pulmonary clearance before pt can have surgery.

## 2016-02-20 NOTE — Patient Instructions (Addendum)
  Your procedure is scheduled on: Monday Nov.13, 2017. Report to [facility]..   Remember: Instructions that are not followed completely may result in serious medical risk, up to and including death, or upon the discretion of your surgeon and anesthesiologist your surgery may need to be rescheduled.    _x___ 1. Do not eat food or drink liquids after midnight. No gum chewing or  hard candies.     _x___ 2. No Alcohol for 24 hours before or after surgery.   ____ 3. Bring all medications with you on the day of surgery if instructed.    __x__ 4. Notify your doctor if there is any change in your medical condition     (cold, fever, infections).    __x___ 5. No smoking 24 hours prior to surgery.     Do not wear jewelry, make-up, hairpins, clips or nail polish.  Do not wear lotions, powders, or perfumes.   Do not shave 48 hours prior to surgery. Men may shave face and neck.  Do not bring valuables to the hospital.    [facility] is not responsible for any belongings or valuables.               Contacts, dentures or bridgework may not be worn into surgery.  Leave your suitcase in the car. After surgery it may be brought to your room.  For patients admitted to the hospital, discharge time is determined by your treatment team.   Patients discharged the day of surgery will not be allowed to drive home.    Please read over the following fact sheets that you were given:   [facility] Preparing for Surgery  __x__ Take these medicines the morning of surgery with A SIP OF WATER:    1. labetalol (NORMODYNE)  2. pantoprazole (PROTONIX)  3. ambrisentan (LETAIRIS)  ____ Fleet Enema (as directed)   _x___ Use CHG Soap as directed on instruction sheet  ____ Use inhalers on the day of surgery and bring to hospital day of surgery  ____ Stop metformin 2 days prior to surgery    ____ Take 1/2 of usual insulin dose the night before surgery and none on the morning of  surgery.   _x___  Stop Coumadin as directed by Dr. Jamal Collin.  _x___ Stop Anti-inflammatories such as Advil, Aleve, Ibuprofen, Motrin, Naproxen, Naprosyn, Goodies powders or aspirin products. OK to take Tylenol.   ____ Stop supplements until after surgery.    ____ Bring C-Pap to the hospital.

## 2016-02-20 NOTE — Pre-Procedure Instructions (Signed)
Received pulmonary clearance from Dr. Raul Del.  Called pt to see if she can come on Thursday for labs, CXR and EKG, pt said her sister will bring her.  Spoke with Sharyn Lull at Dr. Angie Fava office, pt is still on for surgery on Monday Nov. 13, 2017.

## 2016-02-21 NOTE — Pre-Procedure Instructions (Signed)
Received call from [facility] Dr. Joanell Rising nurse.  Dr. Vella Kohler is ok with clearance as long as CXR is clear, even though pt has not taken her antibiotic.  CXR is scheduled for Thursday Nov. 9, 2017.

## 2016-02-23 ENCOUNTER — Encounter
Admission: RE | Admit: 2016-02-23 | Discharge: 2016-02-23 | Disposition: A | Discharge status: Home / Self Care | Payer: Medicare HMO | Source: Ambulatory Visit | Attending: General Surgery | Admitting: General Surgery

## 2016-02-23 ENCOUNTER — Ambulatory Visit
Admission: RE | Admit: 2016-02-23 | Discharge: 2016-02-23 | Disposition: A | Discharge status: Home / Self Care | Payer: Medicare HMO | Source: Ambulatory Visit | Attending: Anesthesiology | Admitting: Anesthesiology

## 2016-02-23 DIAGNOSIS — I2510 Atherosclerotic heart disease of native coronary artery without angina pectoris: Secondary | ICD-10-CM | POA: Insufficient documentation

## 2016-02-23 DIAGNOSIS — R059 Cough, unspecified: Secondary | ICD-10-CM

## 2016-02-23 DIAGNOSIS — I272 Pulmonary hypertension, unspecified: Secondary | ICD-10-CM | POA: Diagnosis not present

## 2016-02-23 DIAGNOSIS — Z01812 Encounter for preprocedural laboratory examination: Secondary | ICD-10-CM | POA: Diagnosis present

## 2016-02-23 DIAGNOSIS — I517 Cardiomegaly: Secondary | ICD-10-CM | POA: Insufficient documentation

## 2016-02-23 DIAGNOSIS — I509 Heart failure, unspecified: Secondary | ICD-10-CM

## 2016-02-23 DIAGNOSIS — Z01810 Encounter for preprocedural cardiovascular examination: Secondary | ICD-10-CM | POA: Insufficient documentation

## 2016-02-23 DIAGNOSIS — R05 Cough: Secondary | ICD-10-CM

## 2016-02-23 LAB — BASIC METABOLIC PANEL
ANION GAP: 7 (ref 5–15)
BUN: 18 mg/dL (ref 6–20)
CHLORIDE: 103 mmol/L (ref 101–111)
CO2: 30 mmol/L (ref 22–32)
Calcium: 9.3 mg/dL (ref 8.9–10.3)
Creatinine, Ser: 1.14 mg/dL — ABNORMAL HIGH (ref 0.44–1.00)
GFR calc Af Amer: 55 mL/min — ABNORMAL LOW (ref >60)
GFR, EST NON AFRICAN AMERICAN: 47 mL/min — AB (ref >60)
GLUCOSE: 113 mg/dL — AB (ref 65–99)
POTASSIUM: 3.5 mmol/L (ref 3.5–5.1)
Sodium: 140 mmol/L (ref 135–145)

## 2016-02-23 LAB — CBC
HEMATOCRIT: 32.9 % — AB (ref 35.0–47.0)
HEMOGLOBIN: 11.1 g/dL — AB (ref 12.0–16.0)
MCH: 31.8 pg (ref 26.0–34.0)
MCHC: 33.7 g/dL (ref 32.0–36.0)
MCV: 94.4 fL (ref 80.0–100.0)
Platelets: 220 10*3/uL (ref 150–440)
RBC: 3.48 MIL/uL — ABNORMAL LOW (ref 3.80–5.20)
RDW: 15.3 % — AB (ref 11.5–14.5)
WBC: 7 10*3/uL (ref 3.6–11.0)

## 2016-02-23 LAB — DIFFERENTIAL
BASOS ABS: 0 10*3/uL (ref 0–0.1)
BASOS PCT: 1 %
EOS ABS: 0.2 10*3/uL (ref 0–0.7)
Eosinophils Relative: 3 %
LYMPHS ABS: 1.7 10*3/uL (ref 1.0–3.6)
Lymphocytes Relative: 25 %
MONO ABS: 0.6 10*3/uL (ref 0.2–0.9)
MONOS PCT: 9 %
Neutro Abs: 4.3 10*3/uL (ref 1.4–6.5)
Neutrophils Relative %: 62 %

## 2016-02-23 NOTE — Pre-Procedure Instructions (Signed)
EKG REVIEWED AND OK'ED BY DR A PENWARDEN WITHOUT FURTHER WORKUP IF NO SYMPTOMS OF CHF

## 2016-02-26 MED ORDER — DEXTROSE 5 % IV SOLN
3.0000 g | INTRAVENOUS | Status: AC
  Administered 2016-02-27: 3 g via INTRAVENOUS
  Filled 2016-02-26: qty 3000

## 2016-02-27 ENCOUNTER — Ambulatory Visit: Payer: Medicare HMO

## 2016-02-27 ENCOUNTER — Ambulatory Visit
Admission: RE | Admit: 2016-02-27 | Discharge: 2016-02-27 | Disposition: A | Discharge status: Home / Self Care | Payer: Medicare HMO | Source: Ambulatory Visit | Attending: General Surgery | Admitting: General Surgery

## 2016-02-27 ENCOUNTER — Encounter: Payer: Self-pay | Admitting: *Deleted

## 2016-02-27 ENCOUNTER — Encounter
Admission: RE | Disposition: A | Discharge status: Home / Self Care | Payer: Self-pay | Source: Ambulatory Visit | Attending: General Surgery

## 2016-02-27 ENCOUNTER — Ambulatory Visit: Payer: Medicare HMO | Admitting: Certified Registered Nurse Anesthetist

## 2016-02-27 DIAGNOSIS — E7800 Pure hypercholesterolemia, unspecified: Secondary | ICD-10-CM | POA: Diagnosis not present

## 2016-02-27 DIAGNOSIS — Z87891 Personal history of nicotine dependence: Secondary | ICD-10-CM | POA: Diagnosis not present

## 2016-02-27 DIAGNOSIS — N641 Fat necrosis of breast: Secondary | ICD-10-CM | POA: Insufficient documentation

## 2016-02-27 DIAGNOSIS — Z6841 Body Mass Index (BMI) 40.0 and over, adult: Secondary | ICD-10-CM | POA: Diagnosis not present

## 2016-02-27 DIAGNOSIS — I252 Old myocardial infarction: Secondary | ICD-10-CM | POA: Insufficient documentation

## 2016-02-27 DIAGNOSIS — I2510 Atherosclerotic heart disease of native coronary artery without angina pectoris: Secondary | ICD-10-CM | POA: Insufficient documentation

## 2016-02-27 DIAGNOSIS — J449 Chronic obstructive pulmonary disease, unspecified: Secondary | ICD-10-CM | POA: Insufficient documentation

## 2016-02-27 DIAGNOSIS — N6021 Fibroadenosis of right breast: Secondary | ICD-10-CM | POA: Diagnosis not present

## 2016-02-27 DIAGNOSIS — R7303 Prediabetes: Secondary | ICD-10-CM | POA: Insufficient documentation

## 2016-02-27 DIAGNOSIS — Z79899 Other long term (current) drug therapy: Secondary | ICD-10-CM | POA: Diagnosis not present

## 2016-02-27 DIAGNOSIS — Z8673 Personal history of transient ischemic attack (TIA), and cerebral infarction without residual deficits: Secondary | ICD-10-CM | POA: Insufficient documentation

## 2016-02-27 DIAGNOSIS — Z7901 Long term (current) use of anticoagulants: Secondary | ICD-10-CM | POA: Insufficient documentation

## 2016-02-27 DIAGNOSIS — D241 Benign neoplasm of right breast: Secondary | ICD-10-CM

## 2016-02-27 DIAGNOSIS — Z7982 Long term (current) use of aspirin: Secondary | ICD-10-CM | POA: Diagnosis not present

## 2016-02-27 DIAGNOSIS — K219 Gastro-esophageal reflux disease without esophagitis: Secondary | ICD-10-CM | POA: Insufficient documentation

## 2016-02-27 DIAGNOSIS — I10 Essential (primary) hypertension: Secondary | ICD-10-CM | POA: Insufficient documentation

## 2016-02-27 DIAGNOSIS — E669 Obesity, unspecified: Secondary | ICD-10-CM | POA: Insufficient documentation

## 2016-02-27 DIAGNOSIS — N6091 Unspecified benign mammary dysplasia of right breast: Secondary | ICD-10-CM | POA: Diagnosis not present

## 2016-02-27 DIAGNOSIS — N6489 Other specified disorders of breast: Secondary | ICD-10-CM | POA: Diagnosis present

## 2016-02-27 HISTORY — PX: BREAST EXCISIONAL BIOPSY: SUR124

## 2016-02-27 HISTORY — PX: BREAST LUMPECTOMY WITH NEEDLE LOCALIZATION: SHX5759

## 2016-02-27 LAB — GLUCOSE, CAPILLARY
GLUCOSE-CAPILLARY: 105 mg/dL — AB (ref 65–99)
Glucose-Capillary: 107 mg/dL — ABNORMAL HIGH (ref 65–99)

## 2016-02-27 LAB — PROTIME-INR
INR: 1.15
PROTHROMBIN TIME: 14.8 s (ref 11.4–15.2)

## 2016-02-27 SURGERY — BREAST LUMPECTOMY WITH NEEDLE LOCALIZATION
Anesthesia: General | Laterality: Right | Wound class: Clean

## 2016-02-27 MED ORDER — LIDOCAINE HCL (PF) 1 % IJ SOLN
INTRAMUSCULAR | Status: AC
  Filled 2016-02-27: qty 2

## 2016-02-27 MED ORDER — FENTANYL CITRATE (PF) 100 MCG/2ML IJ SOLN: 25.0000 ug | INTRAMUSCULAR | Status: DC | PRN

## 2016-02-27 MED ORDER — PHENYLEPHRINE HCL 10 MG/ML IJ SOLN
INTRAMUSCULAR | Status: DC | PRN
  Administered 2016-02-27: 200 ug via INTRAVENOUS
  Administered 2016-02-27 (×2): 100 ug via INTRAVENOUS
  Administered 2016-02-27 (×4): 200 ug via INTRAVENOUS

## 2016-02-27 MED ORDER — IPRATROPIUM-ALBUTEROL 0.5-2.5 (3) MG/3ML IN SOLN
3.0000 mL | Freq: Four times a day (QID) | RESPIRATORY_TRACT | Status: DC
  Administered 2016-02-27: 3 mL via RESPIRATORY_TRACT

## 2016-02-27 MED ORDER — LIDOCAINE HCL (CARDIAC) 20 MG/ML IV SOLN
INTRAVENOUS | Status: DC | PRN
  Administered 2016-02-27: 40 mg via INTRAVENOUS

## 2016-02-27 MED ORDER — SODIUM CHLORIDE 0.9 % IV SOLN
INTRAVENOUS | Status: DC
  Administered 2016-02-27: 11:00:00 via INTRAVENOUS

## 2016-02-27 MED ORDER — CHLORHEXIDINE GLUCONATE CLOTH 2 % EX PADS
6.0000 | MEDICATED_PAD | Freq: Once | CUTANEOUS | Status: DC
Start: 2016-02-27 — End: 2016-02-27

## 2016-02-27 MED ORDER — FENTANYL CITRATE (PF) 100 MCG/2ML IJ SOLN
INTRAMUSCULAR | Status: DC | PRN
  Administered 2016-02-27 (×2): 50 ug via INTRAVENOUS

## 2016-02-27 MED ORDER — SODIUM CHLORIDE 0.9 % IJ SOLN
INTRAMUSCULAR | Status: AC
  Filled 2016-02-27: qty 10

## 2016-02-27 MED ORDER — EPHEDRINE SULFATE 50 MG/ML IJ SOLN
INTRAMUSCULAR | Status: DC | PRN
  Administered 2016-02-27 (×2): 10 mg via INTRAVENOUS
  Administered 2016-02-27 (×2): 15 mg via INTRAVENOUS

## 2016-02-27 MED ORDER — BUPIVACAINE HCL (PF) 0.5 % IJ SOLN
INTRAMUSCULAR | Status: AC
Start: 2016-02-27 — End: 2016-02-27
  Filled 2016-02-27: qty 30

## 2016-02-27 MED ORDER — ONDANSETRON HCL 4 MG/2ML IJ SOLN
INTRAMUSCULAR | Status: DC | PRN
  Administered 2016-02-27: 4 mg via INTRAVENOUS

## 2016-02-27 MED ORDER — BUPIVACAINE HCL (PF) 0.5 % IJ SOLN
INTRAMUSCULAR | Status: DC | PRN
Start: 2016-02-27 — End: 2016-02-27
  Administered 2016-02-27: 20 mL

## 2016-02-27 MED ORDER — LACTATED RINGERS IV SOLN
INTRAVENOUS | Status: DC | PRN
  Administered 2016-02-27: 12:00:00 via INTRAVENOUS

## 2016-02-27 MED ORDER — MIDAZOLAM HCL 2 MG/2ML IJ SOLN
INTRAMUSCULAR | Status: DC | PRN
  Administered 2016-02-27: 2 mg via INTRAVENOUS

## 2016-02-27 MED ORDER — PROPOFOL 10 MG/ML IV BOLUS
INTRAVENOUS | Status: DC | PRN
  Administered 2016-02-27: 150 mg via INTRAVENOUS

## 2016-02-27 MED ORDER — ONDANSETRON HCL 4 MG/2ML IJ SOLN: 4.0000 mg | Freq: Once | INTRAMUSCULAR | Status: DC | PRN

## 2016-02-27 MED ORDER — METHYLENE BLUE 0.5 % INJ SOLN
INTRAVENOUS | Status: AC
  Filled 2016-02-27: qty 10

## 2016-02-27 MED ORDER — DEXAMETHASONE SODIUM PHOSPHATE 10 MG/ML IJ SOLN
INTRAMUSCULAR | Status: DC | PRN
  Administered 2016-02-27: 10 mg via INTRAVENOUS

## 2016-02-27 MED ORDER — IPRATROPIUM-ALBUTEROL 0.5-2.5 (3) MG/3ML IN SOLN
RESPIRATORY_TRACT | Status: AC
  Filled 2016-02-27: qty 3

## 2016-02-27 SURGICAL SUPPLY — 33 items
BLADE SURG 15 STRL SS SAFETY (BLADE) ×2 IMPLANT
BULB RESERV EVAC DRAIN JP 100C (MISCELLANEOUS) IMPLANT
CANISTER SUCT 1200ML W/VALVE (MISCELLANEOUS) ×2 IMPLANT
CHLORAPREP W/TINT 26ML (MISCELLANEOUS) ×2 IMPLANT
CNTNR SPEC 2.5X3XGRAD LEK (MISCELLANEOUS)
CONT SPEC 4OZ STER OR WHT (MISCELLANEOUS)
CONTAINER SPEC 2.5X3XGRAD LEK (MISCELLANEOUS) IMPLANT
COVER PROBE FLX POLY STRL (MISCELLANEOUS) ×2 IMPLANT
DERMABOND ADVANCED (GAUZE/BANDAGES/DRESSINGS) ×1
DERMABOND ADVANCED .7 DNX12 (GAUZE/BANDAGES/DRESSINGS) ×1 IMPLANT
DEVICE LOCALIZATION ULTRAWIRE (WIRE) IMPLANT
DRAIN CHANNEL JP 15F RND 16 (MISCELLANEOUS) IMPLANT
DRAPE LAPAROTOMY TRNSV 106X77 (MISCELLANEOUS) ×2 IMPLANT
ELECT REM PT RETURN 9FT ADLT (ELECTROSURGICAL) ×2
ELECTRODE REM PT RTRN 9FT ADLT (ELECTROSURGICAL) ×1 IMPLANT
GLOVE BIO SURGEON STRL SZ7 (GLOVE) ×10 IMPLANT
GOWN STRL REUS W/ TWL LRG LVL3 (GOWN DISPOSABLE) ×3 IMPLANT
GOWN STRL REUS W/TWL LRG LVL3 (GOWN DISPOSABLE) ×3
HARMONIC SCALPEL FOCUS (MISCELLANEOUS) IMPLANT
KIT RM TURNOVER STRD PROC AR (KITS) ×2 IMPLANT
LABEL OR SOLS (LABEL) ×2 IMPLANT
MARGIN MAP 10MM (MISCELLANEOUS) ×2 IMPLANT
NEEDLE HYPO 25X1 1.5 SAFETY (NEEDLE) ×2 IMPLANT
PACK BASIN MINOR ARMC (MISCELLANEOUS) ×2 IMPLANT
SUT ETH BLK MONO 3 0 FS 1 12/B (SUTURE) ×2 IMPLANT
SUT MNCRL AB 3-0 PS2 27 (SUTURE) ×2 IMPLANT
SUT VIC AB 2-0 BRD 54 (SUTURE) ×2 IMPLANT
SUT VIC AB 2-0 CT1 27 (SUTURE) ×1
SUT VIC AB 2-0 CT1 TAPERPNT 27 (SUTURE) ×1 IMPLANT
SUT VIC AB 2-0 CT2 27 (SUTURE) ×2 IMPLANT
SYR CONTROL 10ML (SYRINGE) ×2 IMPLANT
ULTRAWIRE LOCALIZATION DEVICE (WIRE)
WATER STERILE IRR 1000ML POUR (IV SOLUTION) ×2 IMPLANT

## 2016-02-27 NOTE — Anesthesia Preprocedure Evaluation (Signed)
Anesthesia Evaluation  Patient identified by MRN, date of birth, ID band Patient awake    Reviewed: Allergy & Precautions, NPO status , Patient's Chart, lab work & pertinent test results  History of Anesthesia Complications Negative for: history of anesthetic complications  Airway Mallampati: III       Dental   Pulmonary shortness of breath, COPD,  COPD inhaler, former smoker,           Cardiovascular hypertension, Pt. on medications and Pt. on home beta blockers + angina + CAD and + Past MI       Neuro/Psych CVA, No Residual Symptoms    GI/Hepatic Neg liver ROS, GERD  Medicated,  Endo/Other  negative endocrine ROS  Renal/GU negative Renal ROS     Musculoskeletal   Abdominal   Peds  Hematology negative hematology ROS (+)   Anesthesia Other Findings   Reproductive/Obstetrics                             Anesthesia Physical Anesthesia Plan  ASA: III  Anesthesia Plan: General   Post-op Pain Management:    Induction: Intravenous  Airway Management Planned: LMA  Additional Equipment:   Intra-op Plan:   Post-operative Plan:   Informed Consent: I have reviewed the patients History and Physical, chart, labs and discussed the procedure including the risks, benefits and alternatives for the proposed anesthesia with the patient or authorized representative who has indicated his/her understanding and acceptance.     Plan Discussed with:   Anesthesia Plan Comments: (Pt with resolving URI almost completed course of Doxy. Temp 100.6. Pt warned that URI may get worse, but with the risk of the removal of the needle pt and surgeon desire to proceed.)        Anesthesia Quick Evaluation

## 2016-02-27 NOTE — Discharge Instructions (Signed)

## 2016-02-27 NOTE — Progress Notes (Signed)
Per. Dr. Jamal Collin patient has to go back to [facility] For removal of needle.

## 2016-02-27 NOTE — Anesthesia Postprocedure Evaluation (Signed)
Anesthesia Post Note  Patient: Michelle Jenkins  Procedure(s) Performed: Procedure(s) (LRB): BREAST LUMPECTOMY WITH NEEDLE LOCALIZATION (Right)  Patient location during evaluation: PACU Anesthesia Type: General Level of consciousness: awake and alert Pain management: pain level controlled Vital Signs Assessment: post-procedure vital signs reviewed and stable Respiratory status: spontaneous breathing and respiratory function stable Cardiovascular status: stable Anesthetic complications: no    Last Vitals:  Vitals:   02/27/16 1017 02/27/16 1324  BP: 116/77 116/69  Pulse: 92 (!) 102  Resp: (!) 22 17  Temp: (!) 38.1 C 36.5 C    Last Pain:  Vitals:   02/27/16 1017  TempSrc: Oral                 KEPHART,WILLIAM K

## 2016-02-27 NOTE — Transfer of Care (Signed)
Immediate Anesthesia Transfer of Care Note  Patient: Michelle Jenkins  Procedure(s) Performed: Procedure(s): BREAST LUMPECTOMY WITH NEEDLE LOCALIZATION (Right)  Patient Location: PACU  Anesthesia Type:General  Level of Consciousness: awake and oriented  Airway & Oxygen Therapy: Patient Spontanous Breathing and Patient connected to nasal cannula oxygen  Post-op Assessment: Report given to RN and Post -op Vital signs reviewed and stable  Post vital signs: Reviewed and stable  Last Vitals:  Vitals:   02/27/16 1017 02/27/16 1324  BP: 116/77 116/69  Pulse: 92 (!) 102  Resp: (!) 22 17  Temp: (!) 38.1 C 36.5 C    Last Pain:  Vitals:   02/27/16 1017  TempSrc: Oral         Complications: No apparent anesthesia complications

## 2016-02-27 NOTE — Interval H&P Note (Signed)
History and Physical Interval Note:  02/27/2016 11:50 AM  Michelle Jenkins  has presented today for surgery, with the diagnosis of PAPILLOMA RIGHT BREAST  The various methods of treatment have been discussed with the patient and family. After consideration of risks, benefits and other options for treatment, the patient has consented to  Procedure(s): BREAST LUMPECTOMY WITH NEEDLE LOCALIZATION (Right) as a surgical intervention .  The patient's history has been reviewed, patient examined, no change in status, stable for surgery.  I have reviewed the patient's chart and labs.  Questions were answered to the patient's satisfaction.     SANKAR,SEEPLAPUTHUR G

## 2016-02-27 NOTE — Progress Notes (Signed)
Patient back from mammo.

## 2016-02-27 NOTE — Progress Notes (Signed)
Pt. Arrived from mammo in wheelchair, patient has Some noted shob, patient states she is normally short Of breath, saw in her chart note from Dr. Silvestre Gunner Office.  Also saw note of patient being on recent antibiotic For current infection.  Immediately called anesthesia To come and speak with and evlauate patient.

## 2016-02-27 NOTE — Op Note (Signed)
Preop diagnosis: Complex sclerosing lesion right breast  Post op diagnosis: Same  Operation: Right breast lumpectomy with wire localization  Surgeon: Mckinley Jewel  Assistant:     Anesthesia: Gen.  Complications: None  EBL: Less than 15 mL  Drains: None  Description: This patient had a prior stereotactic biopsy of a area of microcalcifications showing complex sclerosing lesion. She developed a very large hematoma subsequent to the procedure which was drained and has since resolved to a palpable firm 3 cm area in the upper-outer right breast. Patient underwent wire localization of the clip which was noted to be lying somewhat within the area of the residual hematoma was palpable. Patient was brought to the operating room and put to sleep with an LMA and the right breast prepped and draped as sterile field. Timeout was performed. The skin was tethered by the resolving hematoma and the wire was located on the inferior lateral aspect of this hematoma. An elliptical curved skin incision was then mapped out overlying this to include the skin that was involved with the hematoma. Skin incision was made. 20 mL of was 0.5% Marcaine was instilled in the surrounding tissue for postop analgesia. The entire hard palpable mass with the overlying skin and the wire in place was then carefully dissected out and removed. The excised out tissue was tagged for margins. Specimen mammogram confirmed the presence of the clip within the specimen. This was then sent to pathology. After ensuring hemostasis with cautery and ligatures of 3-0 Vicryl the deeper planes were closed with interrupted 2-0 Vicryl stitches. The skin and subcutaneous tissue was elevated on both sides of the incision to minimize the dimpling effect. The skin was approximated with the subcuticular 3-0 Monocryl. Dermabond was applied. Patient subsequently extubated and returned recovery room stable condition

## 2016-02-27 NOTE — H&P (View-Only) (Signed)
Patient ID: Michelle Jenkins, female   DOB: 09-05-1945, 70 y.o.   MRN: [id number]  Chief Complaint  Patient presents with  . Follow-up    mammogram    HPI Michelle Jenkins is a 70 y.o. female who presents for a breast evaluation. The most recent mammogram was done on 02/03/16 .  Patient does perform regular self breast checks and gets regular mammograms done. In August of this yr she had stereotactic biopsy of an area of calcifications in uoq right breast- complex sclerosing lesion with a intraductal papilloma.  At that time she developed a large(10cm) hematoma that had to be drained.  I have reviewed the history of present illness with the patient.   HPI  Past Medical History:  Diagnosis Date  . Anginal pain ([place])   . Bronchitis   . CVA (cerebral infarction)   . Dysrhythmia   . GERD (gastroesophageal reflux disease)   . Hypercholesteremia   . Hypertension   . Myocardial infarction   . Obesity   . Pre-diabetes   . Shortness of breath dyspnea   . Sleep apnea   . Stroke [facility])     Past Surgical History:  Procedure Laterality Date  . BREAST BIOPSY Right 11/15/2015   stereo, COMPLEX SCLEROSING LESION WITH INTRADUCTAL PAPILLOMA COMPONENT AND   . CARDIAC CATHETERIZATION    . CARDIAC CATHETERIZATION Right 12/07/2014   Procedure: Right and Left Heart Cath with Coronary Angiography;  Surgeon: Teodoro Spray, MD;  Location: [place] [facility];  Service: Cardiovascular;  Laterality: Right;  . DILATION AND CURETTAGE OF UTERUS    . JOINT REPLACEMENT Left   . KNEE ARTHROSCOPY AND ARTHROTOMY Right   . lt knee replacement    . TONSILLECTOMY      Family History  Problem Relation Age of Onset  . Throat cancer Brother   . Cancer Daughter 72    metastatic uterine PECOMA to the liver and lungs    Social History Social History  Substance Use Topics  . Smoking status: Former Smoker    Quit date: 06/15/2014  . Smokeless tobacco: Never Used  . Alcohol use 0.0 - 0.6 oz/week     Comment:  occasionally    Allergies  Allergen Reactions  . Nyquil Multi-Symptom [Pseudoeph-Doxylamine-Dm-Apap] Itching  . Tylenol [Acetaminophen] Itching    Current Outpatient Prescriptions  Medication Sig Dispense Refill  . ambrisentan (LETAIRIS) 5 MG tablet Take 5 mg by mouth daily.     Marland Kitchen aspirin 81 MG tablet Take 81 mg by mouth daily.    . furosemide (LASIX) 20 MG tablet Take 40 mg by mouth daily.     Marland Kitchen labetalol (NORMODYNE) 200 MG tablet Take 200 mg by mouth 2 (two) times daily.    . mometasone (NASONEX) 50 MCG/ACT nasal spray Place 2 sprays into the nose daily as needed.     Marland Kitchen olmesartan-hydrochlorothiazide (BENICAR HCT) 40-25 MG per tablet Take 1 tablet by mouth daily.    . Olopatadine HCl (PATADAY) 0.2 % SOLN Place 1 drop into both eyes daily as needed.     . pantoprazole (PROTONIX) 40 MG tablet Take 40 mg by mouth daily.    . simvastatin (ZOCOR) 40 MG tablet Take 40 mg by mouth daily at 6 PM.     . warfarin (COUMADIN) 7.5 MG tablet Take 7.5 mg by mouth daily.    . traMADol (ULTRAM) 50 MG tablet Take 50 mg by mouth every 6 (six) hours as needed for moderate pain.  No current facility-administered medications for this visit.     Review of Systems Review of Systems  Constitutional: Negative.   Respiratory: Negative.     Blood pressure (!) 142/76, pulse 80, resp. rate 16, height 5\' 6"  (1.676 m), weight (!) 334 lb (151.5 kg).  Physical Exam Physical Exam  Constitutional: She is oriented to person, place, and time. She appears well-developed and well-nourished.  Eyes: Conjunctivae are normal. No scleral icterus.  Neck: Neck supple.  Cardiovascular: Normal rate, regular rhythm and normal heart sounds.   Pulmonary/Chest: Effort normal and breath sounds normal. Right breast exhibits mass. Right breast exhibits no inverted nipple, no nipple discharge, no skin change and no tenderness. Left breast exhibits no inverted nipple, no mass, no nipple discharge, no skin change and no  tenderness.    Abdominal: Soft. Bowel sounds are normal. There is no tenderness.  Lymphadenopathy:    She has no cervical adenopathy.    She has no axillary adenopathy.  Neurological: She is alert and oriented to person, place, and time.  Skin: Skin is warm and dry.    Data Reviewed Mammogram reviewed. The clip is still in. Some residual calcifications are noted. The density from the hematoma is separate from the clip area  Assessment  Complex sclerosing lesion right breast with intraductal papilloma. Residual calcifications noted in area of biopsy. This lesion warrants complete excision with margins in case path reveals DCIS or invasive CA    Plan   Recommended complete excision of the area of biopsy and at same time the residual hematoma. Pt is agreeable.     Patient to stop her coumadin for 5 days before surgery. Right breast lumpectomy  This patient's surgery has been scheduled for 02-13-16 at [facility]. It is okay for patient to continue 81 mg aspirin once daily.   This information has been scribed by Gaspar Cola CMA.   SANKAR,SEEPLAPUTHUR G 02/21/2016, 9:06 AM

## 2016-02-27 NOTE — Progress Notes (Signed)
Dr. Ronelle Nigh in to talk with patient.  Concerns over recent Infection and shob.

## 2016-02-27 NOTE — Progress Notes (Signed)
Lab here to draw labs.

## 2016-02-28 LAB — SURGICAL PATHOLOGY

## 2016-02-29 ENCOUNTER — Telehealth: Payer: Self-pay | Admitting: *Deleted

## 2016-02-29 NOTE — Telephone Encounter (Signed)
Notified patient as instructed, patient pleased. Discussed follow-up appointments, patient agrees  

## 2016-02-29 NOTE — Telephone Encounter (Signed)
-----   Message from Christene Lye, MD sent at 02/29/2016  7:55 AM EST ----- Rosann Auerbach, please let pt know pathology was benign.

## 2016-03-07 ENCOUNTER — Telehealth: Payer: Self-pay | Admitting: *Deleted

## 2016-03-07 NOTE — Telephone Encounter (Signed)
Patient called to give phone update after surgery. She states she is doing well, had minimal bleeding last night and no abnormal color per patient.

## 2016-03-13 ENCOUNTER — Encounter: Payer: Self-pay | Admitting: General Surgery

## 2016-03-13 ENCOUNTER — Ambulatory Visit (INDEPENDENT_AMBULATORY_CARE_PROVIDER_SITE_OTHER): Payer: Medicare HMO | Admitting: General Surgery

## 2016-03-13 VITALS — BP 140/74 | HR 72 | Resp 14 | Ht 65.0 in | Wt 329.0 lb

## 2016-03-13 DIAGNOSIS — D241 Benign neoplasm of right breast: Secondary | ICD-10-CM

## 2016-03-13 NOTE — Progress Notes (Signed)
Patient ID: Michelle Jenkins, female   DOB: January 28, 1946, 70 y.o.   MRN: [id number]  Chief Complaint  Patient presents with  . Routine Post Op    rigth lumpectomy    HPI Michelle Jenkins is a 70 y.o. female here today for her follow up right breast lumpectomy done on 02/27/2016. Patient states the right breast is occasionally draining. No other complaints noted. I have reviewed the history of present illness with the patient.  HPI  Past Medical History:  Diagnosis Date  . Anginal pain ([place])   . Bronchitis   . CVA (cerebral infarction)   . Dysrhythmia   . GERD (gastroesophageal reflux disease)   . Hypercholesteremia   . Hypertension   . Myocardial infarction   . Obesity   . Pre-diabetes   . Shortness of breath dyspnea   . Sleep apnea   . Stroke [facility])     Past Surgical History:  Procedure Laterality Date  . BREAST BIOPSY Right 11/15/2015   stereo, COMPLEX SCLEROSING LESION WITH INTRADUCTAL PAPILLOMA COMPONENT AND   . BREAST EXCISIONAL BIOPSY Right 02/27/2016   lumpectomy   . BREAST LUMPECTOMY WITH NEEDLE LOCALIZATION Right 02/27/2016   Procedure: BREAST LUMPECTOMY WITH NEEDLE LOCALIZATION;  Surgeon: Christene Lye, MD;  Location: [facility] ORS;  Service: General;  Laterality: Right;  . CARDIAC CATHETERIZATION    . CARDIAC CATHETERIZATION Right 12/07/2014   Procedure: Right and Left Heart Cath with Coronary Angiography;  Surgeon: Teodoro Spray, MD;  Location: [place] [facility];  Service: Cardiovascular;  Laterality: Right;  . DILATION AND CURETTAGE OF UTERUS    . JOINT REPLACEMENT Left   . KNEE ARTHROSCOPY AND ARTHROTOMY Right   . lt knee replacement    . TONSILLECTOMY      Family History  Problem Relation Age of Onset  . Throat cancer Brother   . Cancer Daughter 39    metastatic uterine PECOMA to the liver and lungs    Social History Social History  Substance Use Topics  . Smoking status: Former Smoker    Quit date: 06/15/2014  . Smokeless tobacco: Never Used    . Alcohol use 0.0 - 0.6 oz/week     Comment: occasionally    Allergies  Allergen Reactions  . Nyquil Multi-Symptom [Pseudoeph-Doxylamine-Dm-Apap] Itching  . Tylenol [Acetaminophen] Itching    Current Outpatient Prescriptions  Medication Sig Dispense Refill  . ambrisentan (LETAIRIS) 5 MG tablet Take 5 mg by mouth daily.     Marland Kitchen aspirin 81 MG tablet Take 81 mg by mouth daily.    . furosemide (LASIX) 20 MG tablet Take 40 mg by mouth daily.     Marland Kitchen labetalol (NORMODYNE) 200 MG tablet Take 200 mg by mouth 2 (two) times daily.    . mometasone (NASONEX) 50 MCG/ACT nasal spray Place 2 sprays into the nose daily as needed.     Marland Kitchen olmesartan-hydrochlorothiazide (BENICAR HCT) 40-25 MG per tablet Take 1 tablet by mouth daily.    . Olopatadine HCl (PATADAY) 0.2 % SOLN Place 1 drop into both eyes daily as needed.     . pantoprazole (PROTONIX) 40 MG tablet Take 40 mg by mouth daily.    . simvastatin (ZOCOR) 40 MG tablet Take 40 mg by mouth daily at 6 PM.     . traMADol (ULTRAM) 50 MG tablet Take 50 mg by mouth every 6 (six) hours as needed for moderate pain.    Marland Kitchen warfarin (COUMADIN) 7.5 MG tablet Take 7.5 mg by mouth daily.  No current facility-administered medications for this visit.     Review of Systems Review of Systems  Constitutional: Negative.   Respiratory: Negative.   Cardiovascular: Negative.     Blood pressure 140/74, pulse 72, resp. rate 14, height 5\' 5"  (1.651 m), weight (!) 329 lb (149.2 kg).  Physical Exam Physical Exam  Constitutional: She is oriented to person, place, and time. She appears well-developed and well-nourished.  Pulmonary/Chest: Right breast exhibits no inverted nipple, no mass and no tenderness.    Neurological: She is alert and oriented to person, place, and time.  Skin: Skin is warm and dry.    Data Reviewed Path report- showed benign findings   Assessment    Benign right breast lesion    Plan    Return in 3 weeks to assess wound healing      This information has been scribed by Gaspar Cola CMA.   Devanta Daniel G 03/13/2016, 2:10 PM

## 2016-03-13 NOTE — Patient Instructions (Signed)
Return in 3 weeks

## 2016-04-03 ENCOUNTER — Ambulatory Visit (INDEPENDENT_AMBULATORY_CARE_PROVIDER_SITE_OTHER): Payer: Medicare HMO | Admitting: General Surgery

## 2016-04-03 ENCOUNTER — Encounter: Payer: Self-pay | Admitting: General Surgery

## 2016-04-03 VITALS — BP 100/58 | HR 80 | Resp 22 | Ht 65.0 in | Wt 336.0 lb

## 2016-04-03 DIAGNOSIS — D241 Benign neoplasm of right breast: Secondary | ICD-10-CM

## 2016-04-03 NOTE — Patient Instructions (Addendum)
The patient is aware to call back for any questions or concerns. The patient has been asked to return to the office in six months with a bilateral screeninig mammogram.

## 2016-04-03 NOTE — Progress Notes (Signed)
Patient ID: Michelle Jenkins, female   DOB: 04/08/1946, 70 y.o.   MRN: [id number]  Chief Complaint  Patient presents with  . Routine Post Op    HPI Michelle Jenkins is a 70 y.o. female here today for her follow up right breast lumpectomy done on 02/27/2016. She states she is doing well. Patient states the right breast is occasionally draining, but is better than before.  She does feel like her shortness of breath is worse and is being followed by Dr Raul Del. I have reviewed the history of present illness with the patient.  HPI  Past Medical History:  Diagnosis Date  . Anginal pain ([place])   . Bronchitis   . CVA (cerebral infarction)   . Dysrhythmia   . GERD (gastroesophageal reflux disease)   . Hypercholesteremia   . Hypertension   . Myocardial infarction   . Obesity   . Pre-diabetes   . Shortness of breath dyspnea   . Sleep apnea   . Stroke [facility])     Past Surgical History:  Procedure Laterality Date  . BREAST BIOPSY Right 11/15/2015   stereo, COMPLEX SCLEROSING LESION WITH INTRADUCTAL PAPILLOMA COMPONENT AND   . BREAST EXCISIONAL BIOPSY Right 02/27/2016   lumpectomy   . BREAST LUMPECTOMY WITH NEEDLE LOCALIZATION Right 02/27/2016   Procedure: BREAST LUMPECTOMY WITH NEEDLE LOCALIZATION;  Surgeon: Christene Lye, MD;  Location: [facility] ORS;  Service: General;  Laterality: Right;  . CARDIAC CATHETERIZATION    . CARDIAC CATHETERIZATION Right 12/07/2014   Procedure: Right and Left Heart Cath with Coronary Angiography;  Surgeon: Teodoro Spray, MD;  Location: [place] [facility];  Service: Cardiovascular;  Laterality: Right;  . DILATION AND CURETTAGE OF UTERUS    . JOINT REPLACEMENT Left   . KNEE ARTHROSCOPY AND ARTHROTOMY Right   . lt knee replacement    . TONSILLECTOMY      Family History  Problem Relation Age of Onset  . Throat cancer Brother   . Cancer Daughter 5    metastatic uterine PECOMA to the liver and lungs    Social History Social History  Substance Use  Topics  . Smoking status: Former Smoker    Quit date: 06/15/2014  . Smokeless tobacco: Never Used  . Alcohol use 0.0 - 0.6 oz/week     Comment: occasionally    Allergies  Allergen Reactions  . Nyquil Multi-Symptom [Pseudoeph-Doxylamine-Dm-Apap] Itching  . Tylenol [Acetaminophen] Itching    Current Outpatient Prescriptions  Medication Sig Dispense Refill  . ambrisentan (LETAIRIS) 5 MG tablet Take 5 mg by mouth daily.     Marland Kitchen aspirin 81 MG tablet Take 81 mg by mouth daily.    . furosemide (LASIX) 20 MG tablet Take 40 mg by mouth daily.     Marland Kitchen labetalol (NORMODYNE) 200 MG tablet Take 200 mg by mouth 2 (two) times daily.    . mometasone (NASONEX) 50 MCG/ACT nasal spray Place 2 sprays into the nose daily as needed.     Marland Kitchen olmesartan-hydrochlorothiazide (BENICAR HCT) 40-25 MG per tablet Take 1 tablet by mouth daily.    . Olopatadine HCl (PATADAY) 0.2 % SOLN Place 1 drop into both eyes daily as needed.     . pantoprazole (PROTONIX) 40 MG tablet Take 40 mg by mouth daily.    . simvastatin (ZOCOR) 40 MG tablet Take 40 mg by mouth daily at 6 PM.     . traMADol (ULTRAM) 50 MG tablet Take 50 mg by mouth every 6 (six) hours as  needed for moderate pain.    Marland Kitchen warfarin (COUMADIN) 7.5 MG tablet Take 7.5 mg by mouth daily.     No current facility-administered medications for this visit.     Review of Systems Review of Systems  Constitutional: Negative.   Respiratory: Positive for shortness of breath.   Cardiovascular: Negative.     Blood pressure (!) 100/58, pulse 80, resp. rate (!) 22, height 5\' 5"  (1.651 m), weight (!) 336 lb (152.4 kg).  Physical Exam Physical Exam  Constitutional: She is oriented to person, place, and time. She appears well-developed and well-nourished.  Neurological: She is alert and oriented to person, place, and time.  Skin: Skin is warm and dry.  Psychiatric: Her behavior is normal.  right breast- incision clean, swelling considerably resolved. Scant spotting in middle.    Data Reviewed Path report- showed benign findings Assessment    Benign right breast lesion. Seroma resolving.        Plan       The patient has been asked to return to the office in six months with a bilateral screening mammogram.  This information has been scribed by Gaspar Cola CMA.   Irving Bloor G 04/03/2016, 11:03 AM

## 2016-05-09 DIAGNOSIS — I4891 Unspecified atrial fibrillation: Secondary | ICD-10-CM | POA: Diagnosis not present

## 2016-05-25 DIAGNOSIS — G4733 Obstructive sleep apnea (adult) (pediatric): Secondary | ICD-10-CM | POA: Diagnosis not present

## 2016-05-25 DIAGNOSIS — R0602 Shortness of breath: Secondary | ICD-10-CM | POA: Diagnosis not present

## 2016-05-25 DIAGNOSIS — I2721 Secondary pulmonary arterial hypertension: Secondary | ICD-10-CM | POA: Diagnosis not present

## 2016-05-25 DIAGNOSIS — Z6841 Body Mass Index (BMI) 40.0 and over, adult: Secondary | ICD-10-CM | POA: Diagnosis not present

## 2016-06-08 DIAGNOSIS — I10 Essential (primary) hypertension: Secondary | ICD-10-CM | POA: Diagnosis not present

## 2016-06-08 DIAGNOSIS — M1990 Unspecified osteoarthritis, unspecified site: Secondary | ICD-10-CM | POA: Diagnosis not present

## 2016-06-08 DIAGNOSIS — I4891 Unspecified atrial fibrillation: Secondary | ICD-10-CM | POA: Diagnosis not present

## 2016-06-18 DIAGNOSIS — R0602 Shortness of breath: Secondary | ICD-10-CM | POA: Diagnosis not present

## 2016-06-18 DIAGNOSIS — I2721 Secondary pulmonary arterial hypertension: Secondary | ICD-10-CM | POA: Diagnosis not present

## 2016-06-22 DIAGNOSIS — R05 Cough: Secondary | ICD-10-CM | POA: Diagnosis not present

## 2016-06-22 DIAGNOSIS — G4733 Obstructive sleep apnea (adult) (pediatric): Secondary | ICD-10-CM | POA: Diagnosis not present

## 2016-06-22 DIAGNOSIS — I2721 Secondary pulmonary arterial hypertension: Secondary | ICD-10-CM | POA: Diagnosis not present

## 2016-06-26 DIAGNOSIS — I270 Primary pulmonary hypertension: Secondary | ICD-10-CM | POA: Diagnosis not present

## 2016-06-26 DIAGNOSIS — M1711 Unilateral primary osteoarthritis, right knee: Secondary | ICD-10-CM | POA: Diagnosis not present

## 2016-06-26 DIAGNOSIS — J449 Chronic obstructive pulmonary disease, unspecified: Secondary | ICD-10-CM | POA: Diagnosis not present

## 2016-07-09 DIAGNOSIS — I10 Essential (primary) hypertension: Secondary | ICD-10-CM | POA: Insufficient documentation

## 2016-07-09 DIAGNOSIS — I2721 Secondary pulmonary arterial hypertension: Secondary | ICD-10-CM | POA: Insufficient documentation

## 2016-07-09 DIAGNOSIS — E7800 Pure hypercholesterolemia, unspecified: Secondary | ICD-10-CM | POA: Insufficient documentation

## 2016-07-26 ENCOUNTER — Other Ambulatory Visit: Payer: Self-pay

## 2016-07-26 DIAGNOSIS — Z1231 Encounter for screening mammogram for malignant neoplasm of breast: Secondary | ICD-10-CM

## 2016-07-30 DIAGNOSIS — M532X7 Spinal instabilities, lumbosacral region: Secondary | ICD-10-CM | POA: Diagnosis not present

## 2016-07-30 DIAGNOSIS — M545 Low back pain: Secondary | ICD-10-CM | POA: Diagnosis not present

## 2016-08-08 ENCOUNTER — Ambulatory Visit (INDEPENDENT_AMBULATORY_CARE_PROVIDER_SITE_OTHER): Payer: Medicare HMO | Admitting: Family Medicine

## 2016-08-08 ENCOUNTER — Encounter: Payer: Self-pay | Admitting: Family Medicine

## 2016-08-08 VITALS — BP 142/90 | HR 78 | Temp 98.9°F | Ht 64.0 in | Wt 331.0 lb

## 2016-08-08 DIAGNOSIS — R600 Localized edema: Secondary | ICD-10-CM | POA: Diagnosis not present

## 2016-08-08 DIAGNOSIS — Z7689 Persons encountering health services in other specified circumstances: Secondary | ICD-10-CM | POA: Diagnosis not present

## 2016-08-08 DIAGNOSIS — I10 Essential (primary) hypertension: Secondary | ICD-10-CM | POA: Diagnosis not present

## 2016-08-08 DIAGNOSIS — I4891 Unspecified atrial fibrillation: Secondary | ICD-10-CM

## 2016-08-08 DIAGNOSIS — G4733 Obstructive sleep apnea (adult) (pediatric): Secondary | ICD-10-CM

## 2016-08-08 LAB — COAGUCHEK XS/INR WAIVED
INR: 2.8 — AB (ref 0.9–1.1)
PROTHROMBIN TIME: 33.1 s

## 2016-08-08 NOTE — Progress Notes (Signed)
BP (!) 142/90   Pulse 78   Temp 98.9 F (37.2 C)   Ht 5\' 4"  (1.626 m)   Wt (!) 331 lb (150.1 kg)   SpO2 92%   BMI 56.82 kg/m    Subjective:    Patient ID: Michelle Jenkins, female    DOB: [birth year], 71 y.o.   MRN: [id number]  HPI: Michelle Jenkins is a 71 y.o. female  Chief Complaint  Patient presents with  . Establish Care    had to switch doctors due to insurance.  . Shortness of Breath    she uses her inhaler twice a day, but doesn't know if it's doing any good. She does she Dr. Raul Del.   . Coagulation Disorder    she is past due on having her PT/INR checked. She alternates between taking 1 tab a day and 1 1/2 tab the next day.   . Medication Refill    she needs a refill on her Lasix. Ankles have been swelling since she's been off of it.    Patient with hx of CHF, CAD, HTN, HLD, OSA on CPAP, GERD, and asthma presents today to establish care as her previous PCP is no longer on her insurance plan. Currently taking coumadin for atrial fib at a dose of 7.5 mg - 1.5 tabs and 1 tab alternating. Last INR somewhere between 2-3 months ago, recalls that levels have been under good control previously. Denies bleeding or bruising issues, palpitations, CP.   Followed regularly by Pulmonology for pulmonary HTN and asthma with DOE. Was previously taking lasix with good relief of SOB and LE edema, but has not been on it for several months. Taking inhalers regularly. Working on CPAP compliance. Quit smoking last winter.   BPs typically under good control on current regimen. Not checking regularly at home, but compliant with medication and not noting any side effects.   Past Medical History:  Diagnosis Date  . Anginal pain ([place])   . Asthma   . Bronchitis   . CAD (coronary artery disease)   . CHF (congestive heart failure) ([place])   . CVA (cerebral infarction)   . Dysrhythmia   . GERD (gastroesophageal reflux disease)   . History of chronic atrial fibrillation   . Hypercholesteremia   .  Hypertension   . Idiopathic pulmonary hypertension ([place])   . Myocardial infarction ([place])   . Obesity   . OSA (obstructive sleep apnea)   . Pre-diabetes   . Pulmonary artery hypertension ([place])   . Shortness of breath dyspnea   . Sleep apnea   . Stroke [facility])    Social History   Social History  . Marital status: Single    Spouse name: N/A  . Number of children: N/A  . Years of education: N/A   Occupational History  . Not on file.   Social History Main Topics  . Smoking status: Former Smoker    Quit date: 06/15/2014  . Smokeless tobacco: Never Used  . Alcohol use 0.0 - 0.6 oz/week     Comment: occasionally  . Drug use: No  . Sexual activity: Not on file   Other Topics Concern  . Not on file   Social History Narrative  . No narrative on file   Relevant past medical, surgical, family and social history reviewed and updated as indicated. Interim medical history since our last visit reviewed. Allergies and medications reviewed and updated.  Review of Systems  Constitutional: Negative.   HENT: Negative.  Respiratory: Positive for shortness of breath.   Cardiovascular: Negative.   Gastrointestinal: Negative.   Genitourinary: Negative.   Musculoskeletal: Positive for arthralgias (chronic, arthritic).  Neurological: Negative.   Psychiatric/Behavioral: Negative.     Per HPI unless specifically indicated above     Objective:    BP (!) 142/90   Pulse 78   Temp 98.9 F (37.2 C)   Ht 5\' 4"  (1.626 m)   Wt (!) 331 lb (150.1 kg)   SpO2 92%   BMI 56.82 kg/m   Wt Readings from Last 3 Encounters:  08/08/16 (!) 331 lb (150.1 kg)  04/03/16 (!) 336 lb (152.4 kg)  03/13/16 (!) 329 lb (149.2 kg)    Physical Exam  Constitutional: She is oriented to person, place, and time. She appears well-developed and well-nourished.  obese  HENT:  Head: Atraumatic.  Eyes: Conjunctivae are normal. Pupils are equal, round, and reactive to light.  Neck: Normal range of motion. Neck  supple.  Cardiovascular: Normal rate and normal heart sounds.   Pulmonary/Chest: Effort normal and breath sounds normal. No respiratory distress.  Musculoskeletal: Normal range of motion. She exhibits edema (Mild edema b/l LEs, minimal ttp, no pitting).  Neurological: She is alert and oriented to person, place, and time.  Skin: Skin is warm and dry.  Psychiatric: She has a normal mood and affect. Her behavior is normal.  Nursing note and vitals reviewed.     Assessment & Plan:   Problem List Items Addressed This Visit      Cardiovascular and Mediastinum   Hypertension    Slightly up today, but WNL at previous visits when reviewing other charts. Will continue to monitor and recheck at upcoming f/u. Continue current regimen, counseled on DASH diet, weight loss      Relevant Medications   ezetimibe (ZETIA) 10 MG tablet   ambrisentan (LETAIRIS) 10 MG tablet   Atrial fibrillation ([facility])    INR at goal today at 2.8. Continue current regimen of 1 tab alternating with 1.5 tabs. Will recheck in 1 month.       Relevant Medications   ezetimibe (ZETIA) 10 MG tablet   ambrisentan (LETAIRIS) 10 MG tablet   Other Relevant Orders   CoaguChek XS/INR Waived (Completed)     Respiratory   Obstructive apnea    Re-iterated importance of CPAP compliance. Pt agreeable to plan. Also discussed weight loss and the impact it would have on her sleep quality       Other Visit Diagnoses    Encounter to establish care    -  Primary   Bilateral leg edema       Will check electrolytes prior to restarting lasix. Discussed weight loss, increasing activity, and compression stockings   Relevant Orders   Comprehensive metabolic panel (Completed)       Follow up plan: Return in about 4 weeks (around 09/05/2016) for INR, Lipid.

## 2016-08-09 ENCOUNTER — Telehealth: Payer: Self-pay | Admitting: Family Medicine

## 2016-08-09 ENCOUNTER — Other Ambulatory Visit: Payer: Self-pay | Admitting: Family Medicine

## 2016-08-09 LAB — COMPREHENSIVE METABOLIC PANEL
ALBUMIN: 4.3 g/dL (ref 3.5–4.8)
ALT: 9 IU/L (ref 0–32)
AST: 19 IU/L (ref 0–40)
Albumin/Globulin Ratio: 1.8 (ref 1.2–2.2)
Alkaline Phosphatase: 68 IU/L (ref 39–117)
BUN/Creatinine Ratio: 14 (ref 12–28)
BUN: 14 mg/dL (ref 8–27)
Bilirubin Total: 0.5 mg/dL (ref 0.0–1.2)
CALCIUM: 9 mg/dL (ref 8.7–10.3)
CO2: 28 mmol/L (ref 18–29)
CREATININE: 1 mg/dL (ref 0.57–1.00)
Chloride: 100 mmol/L (ref 96–106)
GFR calc Af Amer: 65 mL/min/{1.73_m2} (ref >59)
GFR, EST NON AFRICAN AMERICAN: 56 mL/min/{1.73_m2} — AB (ref >59)
GLOBULIN, TOTAL: 2.4 g/dL (ref 1.5–4.5)
Glucose: 102 mg/dL — ABNORMAL HIGH (ref 65–99)
Potassium: 4.3 mmol/L (ref 3.5–5.2)
SODIUM: 141 mmol/L (ref 134–144)
Total Protein: 6.7 g/dL (ref 6.0–8.5)

## 2016-08-09 MED ORDER — FUROSEMIDE 20 MG PO TABS: 20.0000 mg | ORAL_TABLET | Freq: Every day | ORAL | 1 refills | Status: DC

## 2016-08-09 NOTE — Telephone Encounter (Signed)
Please call pt and let her know her electrolytes and renal function came back normal so I have sent some lasix over to her pharmacy that she can get back on if her legs start back swelling. She does not have to take it every day, just when she feels swollen. Make sure she knows to be drinking lots of water while on the medication. We will recheck at her next f/u. Thanks

## 2016-08-09 NOTE — Telephone Encounter (Signed)
Patient notified of results.

## 2016-08-14 NOTE — Patient Instructions (Addendum)
DASH Eating Plan DASH stands for "Dietary Approaches to Stop Hypertension." The DASH eating plan is a healthy eating plan that has been shown to reduce high blood pressure (hypertension). It may also reduce your risk for type 2 diabetes, heart disease, and stroke. The DASH eating plan may also help with weight loss. What are tips for following this plan? General guidelines  Avoid eating more than 2,300 mg (milligrams) of salt (sodium) a day. If you have hypertension, you may need to reduce your sodium intake to 1,500 mg a day.  Limit alcohol intake to no more than 1 drink a day for nonpregnant women and 2 drinks a day for men. One drink equals 12 oz of beer, 5 oz of wine, or 1 oz of hard liquor.  Work with your health care provider to maintain a healthy body weight or to lose weight. Ask what an ideal weight is for you.  Get at least 30 minutes of exercise that causes your heart to beat faster (aerobic exercise) most days of the week. Activities may include walking, swimming, or biking.  Work with your health care provider or diet and nutrition specialist (dietitian) to adjust your eating plan to your individual calorie needs. Reading food labels  Check food labels for the amount of sodium per serving. Choose foods with less than 5 percent of the Daily Value of sodium. Generally, foods with less than 300 mg of sodium per serving fit into this eating plan.  To find whole grains, look for the word "whole" as the first word in the ingredient list. Shopping  Buy products labeled as "low-sodium" or "no salt added."  Buy fresh foods. Avoid canned foods and premade or frozen meals. Cooking  Avoid adding salt when cooking. Use salt-free seasonings or herbs instead of table salt or sea salt. Check with your health care provider or pharmacist before using salt substitutes.  Do not fry foods. Cook foods using healthy methods such as baking, boiling, grilling, and broiling instead.  Cook with  heart-healthy oils, such as olive, canola, soybean, or sunflower oil. Meal planning   Eat a balanced diet that includes: ? 5 or more servings of fruits and vegetables each day. At each meal, try to fill half of your plate with fruits and vegetables. ? Up to 6-8 servings of whole grains each day. ? Less than 6 oz of lean meat, poultry, or fish each day. A 3-oz serving of meat is about the same size as a deck of cards. One egg equals 1 oz. ? 2 servings of low-fat dairy each day. ? A serving of nuts, seeds, or beans 5 times each week. ? Heart-healthy fats. Healthy fats called Omega-3 fatty acids are found in foods such as flaxseeds and coldwater fish, like sardines, salmon, and mackerel.  Limit how much you eat of the following: ? Canned or prepackaged foods. ? Food that is high in trans fat, such as fried foods. ? Food that is high in saturated fat, such as fatty meat. ? Sweets, desserts, sugary drinks, and other foods with added sugar. ? Full-fat dairy products.  Do not salt foods before eating.  Try to eat at least 2 vegetarian meals each week.  Eat more home-cooked food and less restaurant, buffet, and fast food.  When eating at a restaurant, ask that your food be prepared with less salt or no salt, if possible. What foods are recommended? The items listed may not be a complete list. Talk with your dietitian about what   dietary choices are best for you. Grains Whole-grain or whole-wheat bread. Whole-grain or whole-wheat pasta. Brown rice. Oatmeal. Quinoa. Bulgur. Whole-grain and low-sodium cereals. Pita bread. Low-fat, low-sodium crackers. Whole-wheat flour tortillas. Vegetables Fresh or frozen vegetables (raw, steamed, roasted, or grilled). Low-sodium or reduced-sodium tomato and vegetable juice. Low-sodium or reduced-sodium tomato sauce and tomato paste. Low-sodium or reduced-sodium canned vegetables. Fruits All fresh, dried, or frozen fruit. Canned fruit in natural juice (without  added sugar). Meat and other protein foods Skinless chicken or turkey. Ground chicken or turkey. Pork with fat trimmed off. Fish and seafood. Egg whites. Dried beans, peas, or lentils. Unsalted nuts, nut butters, and seeds. Unsalted canned beans. Lean cuts of beef with fat trimmed off. Low-sodium, lean deli meat. Dairy Low-fat (1%) or fat-free (skim) milk. Fat-free, low-fat, or reduced-fat cheeses. Nonfat, low-sodium ricotta or cottage cheese. Low-fat or nonfat yogurt. Low-fat, low-sodium cheese. Fats and oils Soft margarine without trans fats. Vegetable oil. Low-fat, reduced-fat, or light mayonnaise and salad dressings (reduced-sodium). Canola, safflower, olive, soybean, and sunflower oils. Avocado. Seasoning and other foods Herbs. Spices. Seasoning mixes without salt. Unsalted popcorn and pretzels. Fat-free sweets. What foods are not recommended? The items listed may not be a complete list. Talk with your dietitian about what dietary choices are best for you. Grains Baked goods made with fat, such as croissants, muffins, or some breads. Dry pasta or rice meal packs. Vegetables Creamed or fried vegetables. Vegetables in a cheese sauce. Regular canned vegetables (not low-sodium or reduced-sodium). Regular canned tomato sauce and paste (not low-sodium or reduced-sodium). Regular tomato and vegetable juice (not low-sodium or reduced-sodium). Pickles. Olives. Fruits Canned fruit in a light or heavy syrup. Fried fruit. Fruit in cream or butter sauce. Meat and other protein foods Fatty cuts of meat. Ribs. Fried meat. Bacon. Sausage. Bologna and other processed lunch meats. Salami. Fatback. Hotdogs. Bratwurst. Salted nuts and seeds. Canned beans with added salt. Canned or smoked fish. Whole eggs or egg yolks. Chicken or turkey with skin. Dairy Whole or 2% milk, cream, and half-and-half. Whole or full-fat cream cheese. Whole-fat or sweetened yogurt. Full-fat cheese. Nondairy creamers. Whipped toppings.  Processed cheese and cheese spreads. Fats and oils Butter. Stick margarine. Lard. Shortening. Ghee. Bacon fat. Tropical oils, such as coconut, palm kernel, or palm oil. Seasoning and other foods Salted popcorn and pretzels. Onion salt, garlic salt, seasoned salt, table salt, and sea salt. Worcestershire sauce. Tartar sauce. Barbecue sauce. Teriyaki sauce. Soy sauce, including reduced-sodium. Steak sauce. Canned and packaged gravies. Fish sauce. Oyster sauce. Cocktail sauce. Horseradish that you find on the shelf. Ketchup. Mustard. Meat flavorings and tenderizers. Bouillon cubes. Hot sauce and Tabasco sauce. Premade or packaged marinades. Premade or packaged taco seasonings. Relishes. Regular salad dressings. Where to find more information:  National Heart, Lung, and Blood Institute: www.nhlbi.nih.gov  American Heart Association: www.heart.org Summary  The DASH eating plan is a healthy eating plan that has been shown to reduce high blood pressure (hypertension). It may also reduce your risk for type 2 diabetes, heart disease, and stroke.  With the DASH eating plan, you should limit salt (sodium) intake to 2,300 mg a day. If you have hypertension, you may need to reduce your sodium intake to 1,500 mg a day.  When on the DASH eating plan, aim to eat more fresh fruits and vegetables, whole grains, lean proteins, low-fat dairy, and heart-healthy fats.  Work with your health care provider or diet and nutrition specialist (dietitian) to adjust your eating plan to your individual   calorie needs. This information is not intended to replace advice given to you by your health care provider. Make sure you discuss any questions you have with your health care provider. Document Released: 03/22/2011 Document Revised: 03/26/2016 Document Reviewed: 03/26/2016 Elsevier Interactive Patient Education  2017 Elsevier Inc.  

## 2016-08-14 NOTE — Assessment & Plan Note (Signed)
INR at goal today at 2.8. Continue current regimen of 1 tab alternating with 1.5 tabs. Will recheck in 1 month.

## 2016-08-14 NOTE — Assessment & Plan Note (Addendum)
Re-iterated importance of CPAP compliance. Pt agreeable to plan. Also discussed weight loss and the impact it would have on her sleep quality

## 2016-08-14 NOTE — Assessment & Plan Note (Signed)
Slightly up today, but WNL at previous visits when reviewing other charts. Will continue to monitor and recheck at upcoming f/u. Continue current regimen, counseled on DASH diet, weight loss

## 2016-08-15 ENCOUNTER — Telehealth: Payer: Self-pay | Admitting: Family Medicine

## 2016-08-15 NOTE — Telephone Encounter (Signed)
Discussed with patient that this office had not tried to contact her. Pt states she thinks the call was regarding her Letaris and/or a lab appt and I advised her to contact [facility] as they have been managing her Letaris and may have been trying to call her.

## 2016-08-15 NOTE — Telephone Encounter (Signed)
Patient called to speak with provider about if the office called her. Please Advise.  Patient's contact number: [phone].  Thank you.

## 2016-08-27 ENCOUNTER — Other Ambulatory Visit: Payer: Self-pay | Admitting: Family Medicine

## 2016-08-28 MED ORDER — PANTOPRAZOLE SODIUM 40 MG PO TBEC: 40.0000 mg | DELAYED_RELEASE_TABLET | Freq: Every day | ORAL | 6 refills | Status: DC

## 2016-08-28 NOTE — Progress Notes (Signed)
Pt called asking for refill of protonix. Refill sent

## 2016-08-30 ENCOUNTER — Other Ambulatory Visit: Payer: Self-pay | Admitting: Family Medicine

## 2016-09-05 ENCOUNTER — Other Ambulatory Visit: Payer: Self-pay | Admitting: Family Medicine

## 2016-09-05 ENCOUNTER — Encounter: Payer: Self-pay | Admitting: Family Medicine

## 2016-09-05 ENCOUNTER — Ambulatory Visit (INDEPENDENT_AMBULATORY_CARE_PROVIDER_SITE_OTHER): Payer: Medicare HMO | Admitting: Family Medicine

## 2016-09-05 VITALS — BP 106/71 | HR 71 | Temp 98.9°F | Wt 329.0 lb

## 2016-09-05 DIAGNOSIS — I4891 Unspecified atrial fibrillation: Secondary | ICD-10-CM

## 2016-09-05 DIAGNOSIS — I10 Essential (primary) hypertension: Secondary | ICD-10-CM

## 2016-09-05 DIAGNOSIS — E7800 Pure hypercholesterolemia, unspecified: Secondary | ICD-10-CM | POA: Diagnosis not present

## 2016-09-05 LAB — COAGUCHEK XS/INR WAIVED
INR: 2.9 — AB (ref 0.9–1.1)
Prothrombin Time: 34.7 s

## 2016-09-05 NOTE — Assessment & Plan Note (Signed)
Cholesterol stable on zetia. Continue current regimen, discussed further lifestyle modifications

## 2016-09-05 NOTE — Assessment & Plan Note (Signed)
INR at goal, 2.9 today. Continue alternating 1 tab and 1.5 tablets daily.

## 2016-09-05 NOTE — Assessment & Plan Note (Signed)
BP stable, continue current regimen. Will check BMP today since addition of lasix previously

## 2016-09-05 NOTE — Progress Notes (Signed)
BP 106/71   Pulse 71   Temp 98.9 F (37.2 C)   Wt (!) 329 lb (149.2 kg)   SpO2 92%   BMI 56.47 kg/m    Subjective:    Patient ID: Michelle Jenkins, female    DOB: [birth year], 71 y.o.   MRN: [id number]  HPI: Michelle Jenkins is a 70 y.o. female  Chief Complaint  Patient presents with  . Anticoagulation    alternating between 1 tablet and 1.5 tablets. unsure of the mg of the pill.  . Hyperlipidemia   Patient presents for INR follow up today. Taking 7.5 mg tablets - 1 tab and 1.5 tabs alternating days. INR has been stable for months now. Previous reading at 2.8. No bleeding or bruising issues, diet and medications unchanged. Denies CP, palpitations.   Fasting today for cholesterol check. Doing well on zetia, no concerns or side effects. Has switched to lean cuisine for her meals and doing well with that. Unable to tolerate exercise d/t pulm HTN so has not been very physically active.   Has started back taking lasix daily, 20 mg instead of 40 mg she was on previously. Some improvement with swelling and SOB since restarting. No noted hypotension since being back on it.   Past Medical History:  Diagnosis Date  . Anginal pain ([place])   . Asthma   . Bronchitis   . CAD (coronary artery disease)   . CHF (congestive heart failure) ([place])   . CVA (cerebral infarction)   . Dysrhythmia   . GERD (gastroesophageal reflux disease)   . History of chronic atrial fibrillation   . Hypercholesteremia   . Hypertension   . Idiopathic pulmonary hypertension ([place])   . Myocardial infarction ([place])   . Obesity   . OSA (obstructive sleep apnea)   . Pre-diabetes   . Pulmonary artery hypertension ([place])   . Shortness of breath dyspnea   . Sleep apnea   . Stroke [facility])    Social History   Social History  . Marital status: Single    Spouse name: N/A  . Number of children: N/A  . Years of education: N/A   Occupational History  . Not on file.   Social History Main Topics  . Smoking status:  Former Smoker    Quit date: 06/15/2014  . Smokeless tobacco: Never Used  . Alcohol use 0.0 - 0.6 oz/week     Comment: occasionally  . Drug use: No  . Sexual activity: Not on file   Other Topics Concern  . Not on file   Social History Narrative  . No narrative on file   Relevant past medical, surgical, family and social history reviewed and updated as indicated. Interim medical history since our last visit reviewed. Allergies and medications reviewed and updated.  Review of Systems  Constitutional: Negative.   HENT: Negative.   Respiratory: Negative.   Cardiovascular: Negative.   Gastrointestinal: Negative.   Genitourinary: Negative.   Musculoskeletal: Negative.   Neurological: Negative.   Hematological: Does not bruise/bleed easily.  Psychiatric/Behavioral: Negative.    Per HPI unless specifically indicated above     Objective:    BP 106/71   Pulse 71   Temp 98.9 F (37.2 C)   Wt (!) 329 lb (149.2 kg)   SpO2 92%   BMI 56.47 kg/m   Wt Readings from Last 3 Encounters:  09/05/16 (!) 329 lb (149.2 kg)  08/08/16 (!) 331 lb (150.1 kg)  04/03/16 (!) 336 lb (152.4 kg)  Physical Exam  Constitutional: She is oriented to person, place, and time. She appears well-developed and well-nourished. No distress.  HENT:  Head: Atraumatic.  Eyes: Conjunctivae are normal. Pupils are equal, round, and reactive to light.  Neck: Normal range of motion. Neck supple.  Cardiovascular: Normal rate.   Pulmonary/Chest: Effort normal and breath sounds normal. No respiratory distress.  Musculoskeletal: Normal range of motion. She exhibits edema (Trace b/l LE edema).  Neurological: She is alert and oriented to person, place, and time.  Skin: Skin is warm and dry.  Psychiatric: She has a normal mood and affect. Her behavior is normal.  Nursing note and vitals reviewed.     Assessment & Plan:   Problem List Items Addressed This Visit      Cardiovascular and Mediastinum   Hypertension      BP stable, continue current regimen. Will check BMP today since addition of lasix previously      Relevant Orders   Basic metabolic panel   Atrial fibrillation ([place]) - Primary    INR at goal, 2.9 today. Continue alternating 1 tab and 1.5 tablets daily.       Relevant Orders   CoaguChek XS/INR Waived     Other   Hypercholesteremia    Cholesterol stable on zetia. Continue current regimen, discussed further lifestyle modifications      Relevant Orders   Lipid Panel [place], Waived       Follow up plan: Return in about 6 weeks (around 10/17/2016).

## 2016-09-06 ENCOUNTER — Telehealth: Payer: Self-pay | Admitting: Family Medicine

## 2016-09-06 LAB — BASIC METABOLIC PANEL
BUN/Creatinine Ratio: 17 (ref 12–28)
BUN: 19 mg/dL (ref 8–27)
CO2: 29 mmol/L (ref 18–29)
Calcium: 8.9 mg/dL (ref 8.7–10.3)
Chloride: 99 mmol/L (ref 96–106)
Creatinine, Ser: 1.13 mg/dL — ABNORMAL HIGH (ref 0.57–1.00)
GFR calc Af Amer: 56 mL/min/{1.73_m2} — ABNORMAL LOW (ref >59)
GFR calc non Af Amer: 49 mL/min/{1.73_m2} — ABNORMAL LOW (ref >59)
Glucose: 104 mg/dL — ABNORMAL HIGH (ref 65–99)
Potassium: 4 mmol/L (ref 3.5–5.2)
Sodium: 143 mmol/L (ref 134–144)

## 2016-09-06 NOTE — Telephone Encounter (Signed)
Please call pt and let her know her kidney function is just a touch elevated. Be sure to stay very well hydrated and we will recheck at next follow up

## 2016-09-06 NOTE — Telephone Encounter (Signed)
No answer, no voicemail set up.

## 2016-09-06 NOTE — Telephone Encounter (Signed)
Patient notified of lab results

## 2016-09-24 ENCOUNTER — Ambulatory Visit
Admission: RE | Admit: 2016-09-24 | Discharge: 2016-09-24 | Disposition: A | Discharge status: Home / Self Care | Payer: Medicare HMO | Source: Ambulatory Visit | Attending: General Surgery | Admitting: General Surgery

## 2016-09-24 DIAGNOSIS — Z1231 Encounter for screening mammogram for malignant neoplasm of breast: Secondary | ICD-10-CM | POA: Insufficient documentation

## 2016-09-25 DIAGNOSIS — I272 Pulmonary hypertension, unspecified: Secondary | ICD-10-CM | POA: Diagnosis not present

## 2016-09-25 DIAGNOSIS — R0902 Hypoxemia: Secondary | ICD-10-CM | POA: Diagnosis not present

## 2016-09-25 DIAGNOSIS — R0602 Shortness of breath: Secondary | ICD-10-CM | POA: Diagnosis not present

## 2016-09-25 DIAGNOSIS — G4733 Obstructive sleep apnea (adult) (pediatric): Secondary | ICD-10-CM | POA: Diagnosis not present

## 2016-09-26 DIAGNOSIS — I272 Pulmonary hypertension, unspecified: Secondary | ICD-10-CM | POA: Diagnosis not present

## 2016-09-26 DIAGNOSIS — J449 Chronic obstructive pulmonary disease, unspecified: Secondary | ICD-10-CM | POA: Diagnosis not present

## 2016-09-26 DIAGNOSIS — G4733 Obstructive sleep apnea (adult) (pediatric): Secondary | ICD-10-CM | POA: Diagnosis not present

## 2016-10-01 ENCOUNTER — Ambulatory Visit: Payer: Medicare HMO | Admitting: General Surgery

## 2016-10-02 ENCOUNTER — Other Ambulatory Visit: Payer: Self-pay | Admitting: Family Medicine

## 2016-10-02 NOTE — Telephone Encounter (Signed)
Routing to provider  

## 2016-10-03 ENCOUNTER — Ambulatory Visit: Payer: Medicare HMO | Admitting: General Surgery

## 2016-10-08 DIAGNOSIS — I272 Pulmonary hypertension, unspecified: Secondary | ICD-10-CM | POA: Diagnosis not present

## 2016-10-08 DIAGNOSIS — J449 Chronic obstructive pulmonary disease, unspecified: Secondary | ICD-10-CM | POA: Diagnosis not present

## 2016-10-08 DIAGNOSIS — G4733 Obstructive sleep apnea (adult) (pediatric): Secondary | ICD-10-CM | POA: Diagnosis not present

## 2016-10-18 ENCOUNTER — Ambulatory Visit: Payer: Medicare HMO | Admitting: Family Medicine

## 2016-10-23 ENCOUNTER — Ambulatory Visit (INDEPENDENT_AMBULATORY_CARE_PROVIDER_SITE_OTHER): Payer: Medicare HMO | Admitting: General Surgery

## 2016-10-23 ENCOUNTER — Encounter: Payer: Self-pay | Admitting: General Surgery

## 2016-10-23 VITALS — BP 140/80 | HR 102 | Resp 22 | Ht 65.0 in | Wt 338.0 lb

## 2016-10-23 DIAGNOSIS — D241 Benign neoplasm of right breast: Secondary | ICD-10-CM

## 2016-10-23 NOTE — Progress Notes (Signed)
Patient ID: Michelle Jenkins, female   DOB: 11-11-1945, 71 y.o.   MRN: [id number]  Chief Complaint  Patient presents with  . Follow-up    mammogram 09-24-16    HPI Michelle Jenkins is a 71 y.o. female who presents for a breast evaluation. The most recent mammogram was done on 09/24/2016 .  Patient does perform regular self breast checks and gets regular mammograms done.  She is now on oxygen.   HPI  Past Medical History:  Diagnosis Date  . Anginal pain ([place])   . Asthma   . Bronchitis   . CAD (coronary artery disease)   . CHF (congestive heart failure) ([place])   . CVA (cerebral infarction)   . Dysrhythmia   . GERD (gastroesophageal reflux disease)   . History of chronic atrial fibrillation   . Hypercholesteremia   . Hypertension   . Idiopathic pulmonary hypertension ([place])   . Myocardial infarction ([place])   . Obesity   . OSA (obstructive sleep apnea)   . Pre-diabetes   . Pulmonary artery hypertension ([place])   . Shortness of breath dyspnea   . Sleep apnea   . Stroke [facility])     Past Surgical History:  Procedure Laterality Date  . BREAST BIOPSY Right 11/15/2015   stereo, COMPLEX SCLEROSING LESION WITH INTRADUCTAL PAPILLOMA COMPONENT AND   . BREAST EXCISIONAL BIOPSY Right 02/27/2016   NEG  . BREAST LUMPECTOMY WITH NEEDLE LOCALIZATION Right 02/27/2016   Procedure: BREAST LUMPECTOMY WITH NEEDLE LOCALIZATION;  Surgeon: Christene Lye, MD;  Location: [facility] ORS;  Service: General;  Laterality: Right;  . CARDIAC CATHETERIZATION    . CARDIAC CATHETERIZATION Right 12/07/2014   Procedure: Right and Left Heart Cath with Coronary Angiography;  Surgeon: Teodoro Spray, MD;  Location: [facility];  Service: Cardiovascular;  Laterality: Right;  . [place]  and 1999  . COLONOSCOPY  10/31/1998  . DILATION AND CURETTAGE OF UTERUS    . FOOT SURGERY Right   . HYSTEROSCOPY    . JOINT REPLACEMENT Left   . KNEE ARTHROSCOPY AND ARTHROTOMY Right   . lt knee replacement     . TONSILLECTOMY    . UVULOPALATOPHARYNGOPLASTY      Family History  Problem Relation Age of Onset  . Diabetes Mother   . Alzheimer's disease Mother   . Throat cancer Brother   . Diabetes Brother   . Cancer Brother        lung  . Hypertension Brother   . Cancer Daughter 3       metastatic uterine PECOMA to the liver and lungs  . Rheumatic fever Daughter   . Diabetes Sister   . Hypertension Sister   . COPD Neg Hx   . Heart disease Neg Hx   . Stroke Neg Hx   . Breast cancer Neg Hx     Social History Social History  Substance Use Topics  . Smoking status: Former Smoker    Quit date: 06/15/2014  . Smokeless tobacco: Never Used  . Alcohol use 0.0 - 0.6 oz/week     Comment: occasionally    Allergies  Allergen Reactions  . Ace Inhibitors Cough  . Nyquil Multi-Symptom [Pseudoeph-Doxylamine-Dm-Apap] Itching  . Tylenol [Acetaminophen] Itching    Current Outpatient Prescriptions  Medication Sig Dispense Refill  . ambrisentan (LETAIRIS) 10 MG tablet Take 10 mg by mouth daily.    Marland Kitchen aspirin 81 MG tablet Take 81 mg by mouth daily.    Marland Kitchen ezetimibe (  ZETIA) 10 MG tablet Take 10 mg by mouth daily.    . furosemide (LASIX) 20 MG tablet TAKE 1 TABLET(20 MG) BY MOUTH DAILY 30 tablet 0  . labetalol (NORMODYNE) 200 MG tablet Take 200 mg by mouth 2 (two) times daily.    Marland Kitchen lidocaine (XYLOCAINE) 5 % ointment     . olmesartan-hydrochlorothiazide (BENICAR HCT) 40-25 MG tablet TAKE 1 TABLET BY MOUTH EVERY DAY 90 tablet 0  . pantoprazole (PROTONIX) 40 MG tablet Take 1 tablet (40 mg total) by mouth daily. 30 tablet 6  . simvastatin (ZOCOR) 40 MG tablet Take 40 mg by mouth daily at 6 PM.     . traMADol (ULTRAM) 50 MG tablet Take 50 mg by mouth every 6 (six) hours as needed for moderate pain.    Marland Kitchen warfarin (COUMADIN) 7.5 MG tablet Take 7.5 mg by mouth daily.     No current facility-administered medications for this visit.     Review of Systems Review of Systems  Constitutional: Negative.    Respiratory: Negative.   Cardiovascular: Negative.     Blood pressure 140/80, pulse (!) 102, resp. rate (!) 22, height 5\' 5"  (1.651 m), weight (!) 338 lb (153.3 kg), SpO2 92 %.  Physical Exam Physical Exam  Constitutional: She is oriented to person, place, and time. She appears well-developed and well-nourished.  Eyes: Conjunctivae are normal.  Neck: Neck supple.  Cardiovascular: Normal rate, regular rhythm and normal heart sounds.   Pulmonary/Chest: Effort normal and breath sounds normal. Right breast exhibits no inverted nipple, no mass, no nipple discharge, no skin change and no tenderness. Left breast exhibits no inverted nipple, no mass, no nipple discharge, no skin change and no tenderness.  Abdominal: Bowel sounds are normal. There is no tenderness.  Lymphadenopathy:    She has no cervical adenopathy.    She has no axillary adenopathy.  Neurological: She is alert and oriented to person, place, and time.  Skin: Skin is warm and dry.    Data Reviewed Mammogram reviewed   Assessment    Stable exam. 1 yr post excision of a complex sclerosing lesion with intraductal papilloma     Plan      Patient to return to her PCP for mammogram and breast checks. The patient is aware to call back for any questions or concerns.   HPI, Physical Exam, Assessment and Plan have been scribed under the direction and in the presence of Mckinley Jewel, MD  Gaspar Cola, CMA I have completed the exam and reviewed the above documentation for accuracy and completeness.  I agree with the above.  Haematologist has been used and any errors in dictation or transcription are unintentional.  Leshia Kope G. Jamal Collin, M.D., F.A.C.S.   Junie Panning G 10/25/2016, 8:26 AM

## 2016-10-23 NOTE — Patient Instructions (Signed)
   Patient to return to her PCP for mammogram and breast checks.The patient is aware to call back for any questions or concerns. 

## 2016-10-26 DIAGNOSIS — J449 Chronic obstructive pulmonary disease, unspecified: Secondary | ICD-10-CM | POA: Diagnosis not present

## 2016-10-26 DIAGNOSIS — G4733 Obstructive sleep apnea (adult) (pediatric): Secondary | ICD-10-CM | POA: Diagnosis not present

## 2016-10-26 DIAGNOSIS — I272 Pulmonary hypertension, unspecified: Secondary | ICD-10-CM | POA: Diagnosis not present

## 2016-11-01 ENCOUNTER — Other Ambulatory Visit: Payer: Self-pay | Admitting: Family Medicine

## 2016-11-08 ENCOUNTER — Encounter: Payer: Self-pay | Admitting: Family Medicine

## 2016-11-08 ENCOUNTER — Ambulatory Visit (INDEPENDENT_AMBULATORY_CARE_PROVIDER_SITE_OTHER): Payer: Medicare HMO | Admitting: Family Medicine

## 2016-11-08 ENCOUNTER — Other Ambulatory Visit: Payer: Self-pay | Admitting: Family Medicine

## 2016-11-08 VITALS — BP 113/65 | HR 67 | Temp 98.6°F | Wt 332.0 lb

## 2016-11-08 DIAGNOSIS — I10 Essential (primary) hypertension: Secondary | ICD-10-CM | POA: Diagnosis not present

## 2016-11-08 DIAGNOSIS — R600 Localized edema: Secondary | ICD-10-CM | POA: Diagnosis not present

## 2016-11-08 DIAGNOSIS — I4891 Unspecified atrial fibrillation: Secondary | ICD-10-CM

## 2016-11-08 DIAGNOSIS — N289 Disorder of kidney and ureter, unspecified: Secondary | ICD-10-CM | POA: Diagnosis not present

## 2016-11-08 MED ORDER — DICLOFENAC SODIUM 1 % TD GEL: 2.0000 g | Freq: Four times a day (QID) | TRANSDERMAL | 2 refills | Status: DC

## 2016-11-08 MED ORDER — WARFARIN SODIUM 7.5 MG PO TABS: ORAL_TABLET | ORAL | 1 refills | Status: DC

## 2016-11-08 MED ORDER — FUROSEMIDE 20 MG PO TABS: 20.0000 mg | ORAL_TABLET | Freq: Two times a day (BID) | ORAL | 3 refills | Status: DC | PRN

## 2016-11-08 MED ORDER — TRIAMCINOLONE ACETONIDE 0.1 % EX CREA: TOPICAL_CREAM | CUTANEOUS | 0 refills | Status: DC

## 2016-11-08 NOTE — Patient Instructions (Addendum)
Take 20 mg furosemide daily as you have been, and on days where your legs feel tight and swollen can add an afternoon dose of 20 mg  Try drinking about 64 ounces of water daily

## 2016-11-08 NOTE — Progress Notes (Signed)
BP 113/65   Pulse 67   Temp 98.6 F (37 C)   Wt (!) 332 lb (150.6 kg)   SpO2 93%   BMI 55.25 kg/m    Subjective:    Patient ID: Michelle Jenkins, female    DOB: [birth year], 71 y.o.   MRN: [id number]  HPI: Michelle Jenkins is a 71 y.o. female  Chief Complaint  Patient presents with  . Anticoagulation    taking 7.5mg  every night  . Edema    ankles are swelling more, she wants to know if the Lasix can be increased to 40mg .   Patient presents today for INR follow up. Alternating between one tablet and 1.5 tablets daily of 7.5 mg coumadin. No bleeding or bruising issues. Last INR was 2.9. Diet is stable, no new medications.   Also wanting to discuss increasing her lasix back to 40 mg daily. Having some increased swelling in LEs since decreasing to 20 mg daily. Has not been using compression stockings but does elevate legs when at rest. Denies worsened SOB, CP.   Past Medical History:  Diagnosis Date  . Anginal pain ([place])   . Asthma   . Bronchitis   . CAD (coronary artery disease)   . CHF (congestive heart failure) ([place])   . CVA (cerebral infarction)   . Dysrhythmia   . GERD (gastroesophageal reflux disease)   . History of chronic atrial fibrillation   . Hypercholesteremia   . Hypertension   . Idiopathic pulmonary hypertension ([place])   . Myocardial infarction ([place])   . Obesity   . OSA (obstructive sleep apnea)   . Pre-diabetes   . Pulmonary artery hypertension ([place])   . Shortness of breath dyspnea   . Sleep apnea   . Stroke [facility])    Social History   Social History  . Marital status: Single    Spouse name: Michelle Jenkins  . Number of children: Michelle Jenkins  . Years of education: Michelle Jenkins   Occupational History  . Not on file.   Social History Main Topics  . Smoking status: Former Smoker    Quit date: 06/15/2014  . Smokeless tobacco: Never Used  . Alcohol use 0.0 - 0.6 oz/week     Comment: occasionally  . Drug use: No  . Sexual activity: Not on file   Other Topics Concern  . Not on  file   Social History Narrative  . No narrative on file   Relevant past medical, surgical, family and social history reviewed and updated as indicated. Interim medical history since our last visit reviewed. Allergies and medications reviewed and updated.  Review of Systems  Constitutional: Negative.   Eyes: Negative.   Respiratory: Negative.   Cardiovascular: Positive for leg swelling. Negative for chest pain.  Gastrointestinal: Negative.   Genitourinary: Negative.   Musculoskeletal: Negative.   Skin: Negative.   Neurological: Negative.   Psychiatric/Behavioral: Negative.    Per HPI unless specifically indicated above     Objective:    BP 113/65   Pulse 67   Temp 98.6 F (37 C)   Wt (!) 332 lb (150.6 kg)   SpO2 93%   BMI 55.25 kg/m   Wt Readings from Last 3 Encounters:  11/08/16 (!) 332 lb (150.6 kg)  10/23/16 (!) 338 lb (153.3 kg)  09/05/16 (!) 329 lb (149.2 kg)    Physical Exam  Constitutional: She is oriented to person, place, and time. She appears well-developed and well-nourished. No distress.  HENT:  Head: Atraumatic.  Eyes:  Pupils are equal, round, and reactive to light. Conjunctivae are normal. No scleral icterus.  Neck: Normal range of motion. Neck supple.  Cardiovascular: Normal rate.   Pulmonary/Chest: Effort normal and breath sounds normal. No respiratory distress.  Musculoskeletal: Normal range of motion. She exhibits edema (2+ pitting edema b/l LEs). She exhibits no tenderness.  Neurological: She is alert and oriented to person, place, and time. Coordination normal.  Skin: Skin is warm and dry. No rash noted. No erythema.  Psychiatric: She has a normal mood and affect. Her behavior is normal.  Nursing note and vitals reviewed.  Results for orders placed or performed in visit on 03/47/42  Basic metabolic panel  Result Value Ref Range   Glucose 97 65 - 99 mg/dL   BUN 22 8 - 27 mg/dL   Creatinine, Ser 1.12 (H) 0.57 - 1.00 mg/dL   GFR calc non Af  Amer 49 (L) >59 mL/min/1.73   GFR calc Af Amer 57 (L) >59 mL/min/1.73   BUN/Creatinine Ratio 20 12 - 28   Sodium 143 134 - 144 mmol/L   Potassium 4.3 3.5 - 5.2 mmol/L   Chloride 100 96 - 106 mmol/L   CO2 30 (H) 20 - 29 mmol/L   Calcium 9.2 8.7 - 10.3 mg/dL      Assessment & Plan:   Problem List Items Addressed This Visit      Cardiovascular and Mediastinum   Hypertension    BP WNL today. Discussed importance of home monitoring as we increase lasix, particularly if having hypotensive sxs. Stay hydrated, checking BMP today. Continue current regimen with prn addition of 20 mg lasix.       Relevant Medications   furosemide (LASIX) 20 MG tablet   Atrial fibrillation ([facility]) - Primary    At goal today at 2.3. Continue current regimen of alternating 1 tablet and 1.5 tablets.       Relevant Medications   furosemide (LASIX) 20 MG tablet   Other Relevant Orders   CoaguChek XS/INR Waived    Other Visit Diagnoses    Bilateral leg edema       Will increase lasix by 20 mg per day in afternoons as needed for days when legs are very swollen. Continue 20 mg lasix QAM additionally.    Relevant Orders   Basic metabolic panel (Completed)       Follow up plan: Return in about 6 weeks (around 12/20/2016) for INR.

## 2016-11-09 ENCOUNTER — Encounter: Payer: Self-pay | Admitting: Family Medicine

## 2016-11-09 LAB — BASIC METABOLIC PANEL
BUN / CREAT RATIO: 20 (ref 12–28)
BUN: 22 mg/dL (ref 8–27)
CO2: 30 mmol/L — AB (ref 20–29)
CREATININE: 1.12 mg/dL — AB (ref 0.57–1.00)
Calcium: 9.2 mg/dL (ref 8.7–10.3)
Chloride: 100 mmol/L (ref 96–106)
GFR calc Af Amer: 57 mL/min/{1.73_m2} — ABNORMAL LOW (ref >59)
GFR calc non Af Amer: 49 mL/min/{1.73_m2} — ABNORMAL LOW (ref >59)
GLUCOSE: 97 mg/dL (ref 65–99)
Potassium: 4.3 mmol/L (ref 3.5–5.2)
Sodium: 143 mmol/L (ref 134–144)

## 2016-11-11 NOTE — Assessment & Plan Note (Signed)
BP WNL today. Discussed importance of home monitoring as we increase lasix, particularly if having hypotensive sxs. Stay hydrated, checking BMP today. Continue current regimen with prn addition of 20 mg lasix.

## 2016-11-11 NOTE — Assessment & Plan Note (Signed)
At goal today at 2.3. Continue current regimen of alternating 1 tablet and 1.5 tablets.

## 2016-11-13 LAB — COAGUCHEK XS/INR WAIVED
INR: 2.3 — AB (ref 0.9–1.1)
PROTHROMBIN TIME: 27.7 s

## 2016-11-26 ENCOUNTER — Other Ambulatory Visit: Payer: Self-pay | Admitting: Family Medicine

## 2016-11-26 DIAGNOSIS — I272 Pulmonary hypertension, unspecified: Secondary | ICD-10-CM | POA: Diagnosis not present

## 2016-11-26 DIAGNOSIS — G4733 Obstructive sleep apnea (adult) (pediatric): Secondary | ICD-10-CM | POA: Diagnosis not present

## 2016-11-26 DIAGNOSIS — J449 Chronic obstructive pulmonary disease, unspecified: Secondary | ICD-10-CM | POA: Diagnosis not present

## 2016-11-26 NOTE — Telephone Encounter (Signed)
Routing to provider. Next appt on 12/13/16.

## 2016-12-13 ENCOUNTER — Ambulatory Visit (INDEPENDENT_AMBULATORY_CARE_PROVIDER_SITE_OTHER): Payer: Medicare HMO

## 2016-12-13 ENCOUNTER — Ambulatory Visit (INDEPENDENT_AMBULATORY_CARE_PROVIDER_SITE_OTHER): Payer: Medicare HMO | Admitting: Family Medicine

## 2016-12-13 ENCOUNTER — Encounter: Payer: Self-pay | Admitting: Family Medicine

## 2016-12-13 VITALS — BP 125/89 | HR 74 | Temp 98.1°F | Resp 18 | Ht 65.0 in | Wt 319.6 lb

## 2016-12-13 DIAGNOSIS — K5900 Constipation, unspecified: Secondary | ICD-10-CM | POA: Diagnosis not present

## 2016-12-13 DIAGNOSIS — Z1159 Encounter for screening for other viral diseases: Secondary | ICD-10-CM | POA: Diagnosis not present

## 2016-12-13 DIAGNOSIS — Z0000 Encounter for general adult medical examination without abnormal findings: Secondary | ICD-10-CM | POA: Diagnosis not present

## 2016-12-13 DIAGNOSIS — I4891 Unspecified atrial fibrillation: Secondary | ICD-10-CM

## 2016-12-13 DIAGNOSIS — R600 Localized edema: Secondary | ICD-10-CM | POA: Diagnosis not present

## 2016-12-13 DIAGNOSIS — Z23 Encounter for immunization: Secondary | ICD-10-CM

## 2016-12-13 LAB — COAGUCHEK XS/INR WAIVED
INR: 4.2 — AB (ref 0.9–1.1)
PROTHROMBIN TIME: 50.1 s

## 2016-12-13 MED ORDER — LUBIPROSTONE 8 MCG PO CAPS: 8.0000 ug | ORAL_CAPSULE | Freq: Two times a day (BID) | ORAL | 0 refills | Status: DC

## 2016-12-13 NOTE — Patient Instructions (Addendum)
Michelle Jenkins , Thank you for taking time to come for your Medicare Wellness Visit. I appreciate your ongoing commitment to your health goals. Please review the following plan we discussed and let me know if I can assist you in the future.   Screening recommendations/referrals: Colonoscopy: cologuard done 05/16/2015, due 04/2018 Mammogram: completed 09/24/2016 Bone Density: completed 01/13/2015 Recommended yearly ophthalmology/optometry visit for glaucoma screening and checkup Recommended yearly dental visit for hygiene and checkup  Vaccinations: Influenza vaccine: due 12/2016 Pneumococcal vaccine: done today Tdap vaccine: due now, check with your insurance company for coverage Shingles vaccine: up to date- please bring a copy of your immunization   Advanced directives: Advance directive discussed with you today. I have provided a copy for you to complete at home and have notarized. Once this is complete please bring a copy in to our office so we can scan it into your chart.  Conditions/risks identified: none  Next appointment: Follow up in one year for your annual wellness exam.    Preventive Care 73 Years and Older, Female Preventive care refers to lifestyle choices and visits with your health care provider that can promote health and wellness. What does preventive care include?  A yearly physical exam. This is also called an annual well check.  Dental exams once or twice a year.  Routine eye exams. Ask your health care provider how often you should have your eyes checked.  Personal lifestyle choices, including:  Daily care of your teeth and gums.  Regular physical activity.  Eating a healthy diet.  Avoiding tobacco and drug use.  Limiting alcohol use.  Practicing safe sex.  Taking low-dose aspirin every day.  Taking vitamin and mineral supplements as recommended by your health care provider. What happens during an annual well check? The services and screenings done by  your health care provider during your annual well check will depend on your age, overall health, lifestyle risk factors, and family history of disease. Counseling  Your health care provider may ask you questions about your:  Alcohol use.  Tobacco use.  Drug use.  Emotional well-being.  Home and relationship well-being.  Sexual activity.  Eating habits.  History of falls.  Memory and ability to understand (cognition).  Work and work Statistician.  Reproductive health. Screening  You may have the following tests or measurements:  Height, weight, and BMI.  Blood pressure.  Lipid and cholesterol levels. These may be checked every 5 years, or more frequently if you are over 39 years old.  Skin check.  Lung cancer screening. You may have this screening every year starting at age 73 if you have a 30-pack-year history of smoking and currently smoke or have quit within the past 15 years.  Fecal occult blood test (FOBT) of the stool. You may have this test every year starting at age 73.  Flexible sigmoidoscopy or colonoscopy. You may have a sigmoidoscopy every 5 years or a colonoscopy every 10 years starting at age 73.  Hepatitis C blood test.  Hepatitis B blood test.  Sexually transmitted disease (STD) testing.  Diabetes screening. This is done by checking your blood sugar (glucose) after you have not eaten for a while (fasting). You may have this done every 1-3 years.  Bone density scan. This is done to screen for osteoporosis. You may have this done starting at age 73.  Mammogram. This may be done every 1-2 years. Talk to your health care provider about how often you should have regular mammograms. Talk with  your health care provider about your test results, treatment options, and if necessary, the need for more tests. Vaccines  Your health care provider may recommend certain vaccines, such as:  Influenza vaccine. This is recommended every year.  Tetanus,  diphtheria, and acellular pertussis (Tdap, Td) vaccine. You may need a Td booster every 10 years.  Zoster vaccine. You may need this after age 73.  Pneumococcal 13-valent conjugate (PCV13) vaccine. One dose is recommended after age 73.  Pneumococcal polysaccharide (PPSV23) vaccine. One dose is recommended after age 73. Talk to your health care provider about which screenings and vaccines you need and how often you need them. This information is not intended to replace advice given to you by your health care provider. Make sure you discuss any questions you have with your health care provider. Document Released: 04/29/2015 Document Revised: 12/21/2015 Document Reviewed: 02/01/2015 Elsevier Interactive Patient Education  2017 [place] Prevention in the Home Falls can cause injuries. They can happen to people of all ages. There are many things you can do to make your home safe and to help prevent falls. What can I do on the outside of my home?  Regularly fix the edges of walkways and driveways and fix any cracks.  Remove anything that might make you trip as you walk through a door, such as a raised step or threshold.  Trim any bushes or trees on the path to your home.  Use bright outdoor lighting.  Clear any walking paths of anything that might make someone trip, such as rocks or tools.  Regularly check to see if handrails are loose or broken. Make sure that both sides of any steps have handrails.  Any raised decks and porches should have guardrails on the edges.  Have any leaves, snow, or ice cleared regularly.  Use sand or salt on walking paths during winter.  Clean up any spills in your garage right away. This includes oil or grease spills. What can I do in the bathroom?  Use night lights.  Install grab bars by the toilet and in the tub and shower. Do not use towel bars as grab bars.  Use non-skid mats or decals in the tub or shower.  If you need to sit down in  the shower, use a plastic, non-slip stool.  Keep the floor dry. Clean up any water that spills on the floor as soon as it happens.  Remove soap buildup in the tub or shower regularly.  Attach bath mats securely with double-sided non-slip rug tape.  Do not have throw rugs and other things on the floor that can make you trip. What can I do in the bedroom?  Use night lights.  Make sure that you have a light by your bed that is easy to reach.  Do not use any sheets or blankets that are too big for your bed. They should not hang down onto the floor.  Have a firm chair that has side arms. You can use this for support while you get dressed.  Do not have throw rugs and other things on the floor that can make you trip. What can I do in the kitchen?  Clean up any spills right away.  Avoid walking on wet floors.  Keep items that you use a lot in easy-to-reach places.  If you need to reach something above you, use a strong step stool that has a grab bar.  Keep electrical cords out of the way.  Do not  use floor polish or wax that makes floors slippery. If you must use wax, use non-skid floor wax.  Do not have throw rugs and other things on the floor that can make you trip. What can I do with my stairs?  Do not leave any items on the stairs.  Make sure that there are handrails on both sides of the stairs and use them. Fix handrails that are broken or loose. Make sure that handrails are as long as the stairways.  Check any carpeting to make sure that it is firmly attached to the stairs. Fix any carpet that is loose or worn.  Avoid having throw rugs at the top or bottom of the stairs. If you do have throw rugs, attach them to the floor with carpet tape.  Make sure that you have a light switch at the top of the stairs and the bottom of the stairs. If you do not have them, ask someone to add them for you. What else can I do to help prevent falls?  Wear shoes that:  Do not have high  heels.  Have rubber bottoms.  Are comfortable and fit you well.  Are closed at the toe. Do not wear sandals.  If you use a stepladder:  Make sure that it is fully opened. Do not climb a closed stepladder.  Make sure that both sides of the stepladder are locked into place.  Ask someone to hold it for you, if possible.  Clearly mark and make sure that you can see:  Any grab bars or handrails.  First and last steps.  Where the edge of each step is.  Use tools that help you move around (mobility aids) if they are needed. These include:  Canes.  Walkers.  Scooters.  Crutches.  Turn on the lights when you go into a dark area. Replace any light bulbs as soon as they burn out.  Set up your furniture so you have a clear path. Avoid moving your furniture around.  If any of your floors are uneven, fix them.  If there are any pets around you, be aware of where they are.  Review your medicines with your doctor. Some medicines can make you feel dizzy. This can increase your chance of falling. Ask your doctor what other things that you can do to help prevent falls. This information is not intended to replace advice given to you by your health care provider. Make sure you discuss any questions you have with your health care provider. Document Released: 01/27/2009 Document Revised: 09/08/2015 Document Reviewed: 05/07/2014 Elsevier Interactive Patient Education  2017 [place].   Pneumococcal Polysaccharide Vaccine: What You Need to Know 1. Why get vaccinated? Vaccination can protect older adults (and some children and younger adults) from pneumococcal disease. Pneumococcal disease is caused by bacteria that can spread from person to person through close contact. It can cause ear infections, and it can also lead to more serious infections of the:  Lungs (pneumonia),  Blood (bacteremia), and  Covering of the brain and spinal cord (meningitis). Meningitis can cause deafness  and brain damage, and it can be fatal.  Anyone can get pneumococcal disease, but children under 67 years of age, people with certain medical conditions, adults over 48 years of age, and cigarette smokers are at the highest risk. About 18,000 older adults die each year from pneumococcal disease in the [place]. Treatment of pneumococcal infections with penicillin and other drugs used to be more effective. But some strains  of the disease have become resistant to these drugs. This makes prevention of the disease, through vaccination, even more important. 2. Pneumococcal polysaccharide vaccine (PPSV23) Pneumococcal polysaccharide vaccine (PPSV23) protects against 23 types of pneumococcal bacteria. It will not prevent all pneumococcal disease. PPSV23 is recommended for:  All adults 53 years of age and older,  Anyone 2 through 73 years of age with certain long-term health problems,  Anyone 2 through 73 years of age with a weakened immune system,  Adults 39 through 73 years of age who smoke cigarettes or have asthma.  Most people need only one dose of PPSV. A second dose is recommended for certain high-risk groups. People 58 and older should get a dose even if they have gotten one or more doses of the vaccine before they turned 65. Your healthcare provider can give you more information about these recommendations. Most healthy adults develop protection within 2 to 3 weeks of getting the shot. 3. Some people should not get this vaccine  Anyone who has had a life-threatening allergic reaction to PPSV should not get another dose.  Anyone who has a severe allergy to any component of PPSV should not receive it. Tell your provider if you have any severe allergies.  Anyone who is moderately or severely ill when the shot is scheduled may be asked to wait until they recover before getting the vaccine. Someone with a mild illness can usually be vaccinated.  Children less than 13 years of age should not  receive this vaccine.  There is no evidence that PPSV is harmful to either a pregnant woman or to her fetus. However, as a precaution, women who need the vaccine should be vaccinated before becoming pregnant, if possible. 4. Risks of a vaccine reaction With any medicine, including vaccines, there is a chance of side effects. These are usually mild and go away on their own, but serious reactions are also possible. About half of people who get PPSV have mild side effects, such as redness or pain where the shot is given, which go away within about two days. Less than 1 out of 100 people develop a fever, muscle aches, or more severe local reactions. Problems that could happen after any vaccine:  People sometimes faint after a medical procedure, including vaccination. Sitting or lying down for about 15 minutes can help prevent fainting, and injuries caused by a fall. Tell your doctor if you feel dizzy, or have vision changes or ringing in the ears.  Some people get severe pain in the shoulder and have difficulty moving the arm where a shot was given. This happens very rarely.  Any medication can cause a severe allergic reaction. Such reactions from a vaccine are very rare, estimated at about 1 in a million doses, and would happen within a few minutes to a few hours after the vaccination. As with any medicine, there is a very remote chance of a vaccine causing a serious injury or death. The safety of vaccines is always being monitored. For more information, visit: http://www.aguilar.org/ 5. What if there is a serious reaction? What should I look for? Look for anything that concerns you, such as signs of a severe allergic reaction, very high fever, or unusual behavior. Signs of a severe allergic reaction can include hives, swelling of the face and throat, difficulty breathing, a fast heartbeat, dizziness, and weakness. These would usually start a few minutes to a few hours after the vaccination. What  should I do? If you think it is  a severe allergic reaction or other emergency that can't wait, call 9-1-1 or get to the nearest hospital. Otherwise, call your doctor. Afterward, the reaction should be reported to the Vaccine Adverse Event Reporting System (VAERS). Your doctor might file this report, or you can do it yourself through the VAERS web site at www.vaers.SamedayNews.es, or by calling (361) 395-2386. VAERS does not give medical advice. 6. How can I learn more?  Ask your doctor. He or she can give you the vaccine package insert or suggest other sources of information.  Call your local or state health department.  Contact the Centers for Disease Control and Prevention (CDC): ? Call 219-611-7753 (1-800-CDC-INFO) or ? Visit CDC's website at http://hunter.com/ CDC Pneumococcal Polysaccharide Vaccine VIS (08/07/13) This information is not intended to replace advice given to you by your health care provider. Make sure you discuss any questions you have with your health care provider. Document Released: 01/28/2006 Document Revised: 12/22/2015 Document Reviewed: 12/22/2015 Elsevier Interactive Patient Education  2017 [employer].

## 2016-12-13 NOTE — Progress Notes (Signed)
Subjective:   Michelle Jenkins is a 71 y.o. female who presents for Medicare Annual (Subsequent) preventive examination.  Review of Systems:   Cardiac Risk Factors include: obesity (BMI >30kg/m2);advanced age (>76men, >75 women);diabetes mellitus;dyslipidemia;hypertension     Objective:     Vitals: BP 125/89 (BP Location: Left Arm, Patient Position: Sitting)   Pulse 74   Temp 98.1 F (36.7 C)   Resp 18   Ht 5\' 5"  (1.651 m)   Wt (!) 319 lb 9.6 oz (145 kg)   SpO2 95%   BMI 53.18 kg/m   Body mass index is 53.18 kg/m.   Tobacco History  Smoking Status  . Former Smoker  . Quit date: 06/15/2014  Smokeless Tobacco  . Never Used     Counseling given: Not Answered   Past Medical History:  Diagnosis Date  . Anginal pain ([place])   . Asthma   . Bronchitis   . CAD (coronary artery disease)   . CHF (congestive heart failure) ([place])   . CVA (cerebral infarction)   . Dysrhythmia   . GERD (gastroesophageal reflux disease)   . History of chronic atrial fibrillation   . Hypercholesteremia   . Hypertension   . Idiopathic pulmonary hypertension ([place])   . Myocardial infarction ([place])   . Obesity   . OSA (obstructive sleep apnea)   . Pre-diabetes   . Pulmonary artery hypertension ([place])   . Shortness of breath dyspnea   . Sleep apnea   . Stroke [facility])    Past Surgical History:  Procedure Laterality Date  . BREAST BIOPSY Right 11/15/2015   stereo, COMPLEX SCLEROSING LESION WITH INTRADUCTAL PAPILLOMA COMPONENT AND   . BREAST EXCISIONAL BIOPSY Right 02/27/2016   NEG  . BREAST LUMPECTOMY WITH NEEDLE LOCALIZATION Right 02/27/2016   Procedure: BREAST LUMPECTOMY WITH NEEDLE LOCALIZATION;  Surgeon: Christene Lye, MD;  Location: [facility] ORS;  Service: General;  Laterality: Right;  . CARDIAC CATHETERIZATION    . CARDIAC CATHETERIZATION Right 12/07/2014   Procedure: Right and Left Heart Cath with Coronary Angiography;  Surgeon: Teodoro Spray, MD;  Location: [facility];   Service: Cardiovascular;  Laterality: Right;  . [place]  and 1999  . COLONOSCOPY  10/31/1998  . DILATION AND CURETTAGE OF UTERUS    . FOOT SURGERY Right   . HYSTEROSCOPY    . JOINT REPLACEMENT Left   . KNEE ARTHROSCOPY AND ARTHROTOMY Right   . lt knee replacement    . TONSILLECTOMY    . UVULOPALATOPHARYNGOPLASTY     Family History  Problem Relation Age of Onset  . Diabetes Mother   . Alzheimer's disease Mother   . Throat cancer Brother   . Diabetes Brother   . Cancer Brother        lung  . Hypertension Brother   . Cancer Daughter 66       metastatic uterine PECOMA to the liver and lungs  . Rheumatic fever Daughter   . Diabetes Sister   . Hypertension Sister   . COPD Neg Hx   . Heart disease Neg Hx   . Stroke Neg Hx   . Breast cancer Neg Hx    History  Sexual Activity  . Sexual activity: Not on file    Outpatient Encounter Prescriptions as of 12/13/2016  Medication Sig  . ambrisentan (LETAIRIS) 10 MG tablet Take 10 mg by mouth daily.  Marland Kitchen aspirin 81 MG tablet Take 81 mg by mouth daily.  . diclofenac  sodium (VOLTAREN) 1 % GEL Apply 2 g topically 4 (four) times daily.  Marland Kitchen ezetimibe (ZETIA) 10 MG tablet Take 10 mg by mouth daily.  . furosemide (LASIX) 20 MG tablet Take 1 tablet (20 mg total) by mouth 2 (two) times daily as needed.  . labetalol (NORMODYNE) 200 MG tablet Take 200 mg by mouth 2 (two) times daily.  Marland Kitchen lidocaine (XYLOCAINE) 5 % ointment   . olmesartan-hydrochlorothiazide (BENICAR HCT) 40-25 MG tablet TAKE 1 TABLET BY MOUTH EVERY DAY  . pantoprazole (PROTONIX) 40 MG tablet Take 1 tablet (40 mg total) by mouth daily.  . simvastatin (ZOCOR) 40 MG tablet Take 40 mg by mouth daily at 6 PM.   . traMADol (ULTRAM) 50 MG tablet Take 50 mg by mouth every 6 (six) hours as needed for moderate pain.  Marland Kitchen triamcinolone cream (KENALOG) 0.1 % Apply to neck 1-2 times daily as needed for rash  . warfarin (COUMADIN) 7.5 MG tablet ALTERNATE EVERY OTHER DAY WITH 1  TABLET BY MOUTH AND 1 AND 1/2 TABLETS  . VENTOLIN HFA 108 (90 Base) MCG/ACT inhaler    No facility-administered encounter medications on file as of 12/13/2016.     Activities of Daily Living In your present state of health, do you have any difficulty performing the following activities: 12/13/2016 02/20/2016  Hearing? N N  Vision? Y Y  Comment - blind in left eye  Difficulty concentrating or making decisions? N N  Walking or climbing stairs? Y Y  Dressing or bathing? N Y  Comment - pt has a difficulty getting into the tub.  pt does sponge bathes  Doing errands, shopping? N N  Preparing Food and eating ? N -  Using the Toilet? N -  In the past six months, have you accidently leaked urine? Y -  Comment pads/depends  -  Do you have problems with loss of bowel control? N -  Managing your Medications? N -  Managing your Finances? N -  Housekeeping or managing your Housekeeping? N -  Some recent data might be hidden    Patient Care Team: Chrismon, Vickki Muff, PA as PCP - General (Family Medicine) Albina Billet, MD (Internal Medicine) Christene Lye, MD (General Surgery)    Assessment:     Exercise Activities and Dietary recommendations Current Exercise Habits: The patient does not participate in regular exercise at present  Goals    None     Fall Risk Fall Risk  12/13/2016 09/05/2016 08/08/2016  Falls in the past year? No No No   Depression Screen PHQ 2/9 Scores 12/13/2016 09/05/2016  PHQ - 2 Score 2 0  PHQ- 9 Score 2 -     Cognitive Function     6CIT Screen 12/13/2016  What Year? 0 points  What month? 0 points  What time? 0 points  Count back from 20 0 points  Months in reverse 0 points  Repeat phrase 4 points  Total Score 4    Immunization History  Administered Date(s) Administered  . Pneumococcal Polysaccharide-23 12/13/2016  . Td 05/11/1999   Screening Tests Health Maintenance  Topic Date Due  . Hepatitis C Screening  1945/11/16  . INFLUENZA  VACCINE  11/14/2016  . TETANUS/TDAP  12/15/2017 (Originally 05/10/2009)  . PNA vac Low Risk Adult (2 of 2 - PPSV23) 12/13/2017  . Fecal DNA (Cologuard)  05/15/2018  . MAMMOGRAM  09/25/2018  . DEXA SCAN  Completed      Plan:     I have personally  reviewed and addressed the Medicare Annual Wellness questionnaire and have noted the following in the patient's chart:  A. Medical and social history B. Use of alcohol, tobacco or illicit drugs  C. Current medications and supplements D. Functional ability and status E.  Nutritional status F.  Physical activity G. Advance directives H. List of other physicians I.  Hospitalizations, surgeries, and ER visits in previous 12 months J.  [place] such as hearing and vision if needed, cognitive and depression L. Referrals and appointments   In addition, I have reviewed and discussed with patient certain preventive protocols, quality metrics, and best practice recommendations. A written personalized care plan for preventive services as well as general preventive health recommendations were provided to patient.   Signed,  Tyler Aas, LPN Nurse Health Advisor   MD Recommendations: none

## 2016-12-13 NOTE — Progress Notes (Signed)
BP 125/89   Pulse 74   Temp 98.1 F (36.7 C) (Oral)   Resp 18   Ht 5\' 5"  (1.651 m)   Wt (!) 319 lb 9.6 oz (145 kg)   SpO2 95%   BMI 53.18 kg/m    Subjective:    Patient ID: Michelle Jenkins, female    DOB: 04/26/1945, 71 y.o.   MRN: [id number]  HPI: Michelle Jenkins is a 71 y.o. female  Chief Complaint  Patient presents with  . Follow-up   Patient presents today for INR check. Taking 7.5 mg tablets, alternating 1 tablet and 1.5 tablets. INRs have been stable and in range on this dose historically. No new medications or dietary changes except has been taking some OTC remedies for constipation lately. Denies bleeding or bruising issues, CP, palpitations.   Constipation - using milk of magnesia as well as stool softeners but they're just causing diarrhea and she still feels bloated and "stopped up". Not currently on a probiotic or fiber supplement. Drinks lots of water.   Doing very well on increased lasix dose, leg swelling much improved. BPs tolerating dose very well.   Past Medical History:  Diagnosis Date  . Anginal pain ([place])   . Asthma   . Bronchitis   . CAD (coronary artery disease)   . CHF (congestive heart failure) ([place])   . CVA (cerebral infarction)   . Dysrhythmia   . GERD (gastroesophageal reflux disease)   . History of chronic atrial fibrillation   . Hypercholesteremia   . Hypertension   . Idiopathic pulmonary hypertension ([place])   . Myocardial infarction ([place])   . Obesity   . OSA (obstructive sleep apnea)   . Pre-diabetes   . Pulmonary artery hypertension ([place])   . Shortness of breath dyspnea   . Sleep apnea   . Stroke [facility])    Social History   Social History  . Marital status: Single    Spouse name: N/A  . Number of children: N/A  . Years of education: N/A   Occupational History  . Not on file.   Social History Main Topics  . Smoking status: Former Smoker    Quit date: 06/15/2014  . Smokeless tobacco: Never Used  . Alcohol use 0.0 - 0.6  oz/week     Comment: occasionally  . Drug use: No  . Sexual activity: Not on file   Other Topics Concern  . Not on file   Social History Narrative  . No narrative on file   Relevant past medical, surgical, family and social history reviewed and updated as indicated. Interim medical history since our last visit reviewed. Allergies and medications reviewed and updated.  Review of Systems  Constitutional: Negative.   HENT: Negative.   Eyes: Negative.   Respiratory: Negative.   Cardiovascular: Negative.   Gastrointestinal: Positive for constipation.  Genitourinary: Negative.   Neurological: Negative.   Hematological: Does not bruise/bleed easily.  Psychiatric/Behavioral: Negative.     Per HPI unless specifically indicated above     Objective:    BP 125/89   Pulse 74   Temp 98.1 F (36.7 C) (Oral)   Resp 18   Ht 5\' 5"  (1.651 m)   Wt (!) 319 lb 9.6 oz (145 kg)   SpO2 95%   BMI 53.18 kg/m   Wt Readings from Last 3 Encounters:  12/13/16 (!) 319 lb 9.6 oz (145 kg)  12/13/16 (!) 319 lb 9.6 oz (145 kg)  11/08/16 (!) 332 lb (150.6  kg)    Physical Exam  Constitutional: She is oriented to person, place, and time. She appears well-developed and well-nourished. No distress.  HENT:  Head: Atraumatic.  Eyes: Pupils are equal, round, and reactive to light. Conjunctivae are normal. No scleral icterus.  Neck: Normal range of motion. Neck supple.  Cardiovascular: Normal rate and normal heart sounds.   Pulmonary/Chest: Effort normal and breath sounds normal. No respiratory distress.  Abdominal: Soft. Bowel sounds are normal. She exhibits no distension. There is no tenderness.  Musculoskeletal: Normal range of motion. She exhibits edema (trace LE edema).  Neurological: She is alert and oriented to person, place, and time.  Skin: Skin is warm and dry.  Psychiatric: She has a normal mood and affect. Her behavior is normal.  Nursing note and vitals reviewed.  Results for orders  placed or performed in visit on 12/13/16  CoaguChek XS/INR Waived  Result Value Ref Range   INR 4.2 (H) 0.9 - 1.1   Prothrombin Time 50.1 sec      Assessment & Plan:   Problem List Items Addressed This Visit      Cardiovascular and Mediastinum   Atrial fibrillation ([place]) - Primary    INR slightly up today at 4.2, will reduce dose to 1 tablet daily and reassess in 2 weeks. Keep diet stable.       Relevant Orders   CoaguChek XS/INR Waived (Completed)    Other Visit Diagnoses    Bilateral leg edema       Much improved on increased lasix dose. Recheck BMP today. BPs tolerating well. Continue current regimen. Elevate legs at rest   Constipation, unspecified constipation type       Will try amitiza, push fluids, start fiber supplement. F/u if no relief       Follow up plan: Return in about 2 years (around 12/14/2018) for INR, BMP.

## 2016-12-13 NOTE — Patient Instructions (Signed)
For constipation: - Start Amitiza twice daily with food - Start a fiber supplement daily - Continue drinking lots of water  For your coumadin: - Reduce dose to 7.5 mg daily (1 tablet daily instead of alternating 1 tablet with 1.5 tablets) - Recheck levels in 2 weeks

## 2016-12-14 ENCOUNTER — Encounter: Payer: Self-pay | Admitting: Family Medicine

## 2016-12-14 LAB — HEPATITIS C ANTIBODY: HEP C VIRUS AB: 0.1 {s_co_ratio} (ref 0.0–0.9)

## 2016-12-16 NOTE — Assessment & Plan Note (Signed)
INR slightly up today at 4.2, will reduce dose to 1 tablet daily and reassess in 2 weeks. Keep diet stable.

## 2016-12-27 DIAGNOSIS — I272 Pulmonary hypertension, unspecified: Secondary | ICD-10-CM | POA: Diagnosis not present

## 2016-12-27 DIAGNOSIS — J449 Chronic obstructive pulmonary disease, unspecified: Secondary | ICD-10-CM | POA: Diagnosis not present

## 2016-12-27 DIAGNOSIS — G4733 Obstructive sleep apnea (adult) (pediatric): Secondary | ICD-10-CM | POA: Diagnosis not present

## 2017-01-09 ENCOUNTER — Other Ambulatory Visit: Payer: Self-pay | Admitting: Family Medicine

## 2017-01-26 DIAGNOSIS — G4733 Obstructive sleep apnea (adult) (pediatric): Secondary | ICD-10-CM | POA: Diagnosis not present

## 2017-01-26 DIAGNOSIS — I272 Pulmonary hypertension, unspecified: Secondary | ICD-10-CM | POA: Diagnosis not present

## 2017-01-26 DIAGNOSIS — J449 Chronic obstructive pulmonary disease, unspecified: Secondary | ICD-10-CM | POA: Diagnosis not present

## 2017-02-05 ENCOUNTER — Ambulatory Visit: Payer: Self-pay

## 2017-02-05 NOTE — Telephone Encounter (Signed)
   Reason for Disposition . [1] MODERATE dizziness (e.g., vertigo; feels very unsteady, interferes with normal activities) AND [2] has NOT been evaluated by physician for this  Answer Assessment - Initial Assessment Questions 1. DESCRIPTION: "Describe your dizziness."     Light headedness 2. VERTIGO: "Do you feel like either you or the room is spinning or tilting?"      No 3. LIGHTHEADED: "Do you feel lightheaded?" (e.g., somewhat faint, woozy, weak upon standing)     Happens when sitting 4. SEVERITY: "How bad is it?"  "Can you walk?"   - MILD - Feels unsteady but walking normally.   - MODERATE - Feels very unsteady when walking, but not falling; interferes with normal activities (e.g., school, work) .   - SEVERE - Unable to walk without falling (requires assistance).     mild 5. ONSET:  "When did the dizziness begin?"     3 Weeks ago 6. AGGRAVATING FACTORS: "Does anything make it worse?" (e.g., standing, change in head position)     No  7. CAUSE: "What do you think is causing the dizziness?"     Blood sugar 8. RECURRENT SYMPTOM: "Have you had dizziness before?" If so, ask: "When was the last time?" "What happened that time?"     Yes ; 3 weeks ago 9. OTHER SYMPTOMS: "Do you have any other symptoms?" (e.g., headache, weakness, numbness, vomiting, earache)     No 10. PREGNANCY: "Is there any chance you are pregnant?" "When was your last menstrual period?"       No  Protocols used: [place]  Wants doctor to be aware of this.

## 2017-02-06 ENCOUNTER — Other Ambulatory Visit: Payer: Self-pay | Admitting: Family Medicine

## 2017-02-06 ENCOUNTER — Encounter: Payer: Self-pay | Admitting: Family Medicine

## 2017-02-06 ENCOUNTER — Ambulatory Visit (INDEPENDENT_AMBULATORY_CARE_PROVIDER_SITE_OTHER): Payer: Medicare HMO | Admitting: Family Medicine

## 2017-02-06 VITALS — BP 114/72 | HR 63 | Temp 98.1°F | Wt 324.0 lb

## 2017-02-06 DIAGNOSIS — I4891 Unspecified atrial fibrillation: Secondary | ICD-10-CM | POA: Diagnosis not present

## 2017-02-06 DIAGNOSIS — I10 Essential (primary) hypertension: Secondary | ICD-10-CM

## 2017-02-06 LAB — COAGUCHEK XS/INR WAIVED
INR: 2.4 — AB (ref 0.9–1.1)
PROTHROMBIN TIME: 28.5 s

## 2017-02-06 MED ORDER — OLMESARTAN MEDOXOMIL-HCTZ 20-12.5 MG PO TABS: 1.0000 | ORAL_TABLET | Freq: Every day | ORAL | 1 refills | Status: DC

## 2017-02-06 NOTE — Progress Notes (Signed)
BP 114/72 (BP Location: Right Wrist, Patient Position: Sitting, Cuff Size: Normal)   Pulse 63   Temp 98.1 F (36.7 C)   Wt (!) 324 lb (147 kg)   SpO2 94%   BMI 53.92 kg/m    Subjective:    Patient ID: Michelle Jenkins, female    DOB: May 11, 1945, 71 y.o.   MRN: [id number]  HPI: Michelle Jenkins is a 71 y.o. female  Chief Complaint  Patient presents with  . Dizziness    Patient reports dizziness for 3 weeks. It happens randomly and doesn't last but a few seconds.   Waves of feeling off balance and lightheaded the past 2 weeks or so. No known trigger. Episodes are short and self limited. Has not fallen, passed out, or had any CP, palpitations, visual disturbances.   Also overdue for INR. Currently taking 1 tablet daily as INR was elevated at 4.2 at previous visit. Doing well on this dose, no bleeding or bruising issues noted. Diet and medications stable.   Past Medical History:  Diagnosis Date  . Anginal pain ([place])   . Asthma   . Bronchitis   . CAD (coronary artery disease)   . CHF (congestive heart failure) ([place])   . CVA (cerebral infarction)   . Dysrhythmia   . GERD (gastroesophageal reflux disease)   . History of chronic atrial fibrillation   . Hypercholesteremia   . Hypertension   . Idiopathic pulmonary hypertension ([place])   . Myocardial infarction ([place])   . Obesity   . OSA (obstructive sleep apnea)   . Pre-diabetes   . Pulmonary artery hypertension ([place])   . Shortness of breath dyspnea   . Sleep apnea   . Stroke [facility])    Social History   Social History  . Marital status: Single    Spouse name: N/A  . Number of children: N/A  . Years of education: N/A   Occupational History  . Not on file.   Social History Main Topics  . Smoking status: Former Smoker    Quit date: 06/15/2014  . Smokeless tobacco: Never Used  . Alcohol use 0.0 - 0.6 oz/week     Comment: occasionally  . Drug use: No  . Sexual activity: Not on file   Other Topics Concern  . Not on file     Social History Narrative  . No narrative on file   Relevant past medical, surgical, family and social history reviewed and updated as indicated. Interim medical history since our last visit reviewed. Allergies and medications reviewed and updated.  Review of Systems  Constitutional: Negative.   HENT: Negative.   Eyes: Negative.   Respiratory: Negative.   Cardiovascular: Negative.   Gastrointestinal: Negative.   Genitourinary: Negative.   Musculoskeletal: Negative.   Neurological: Positive for dizziness and light-headedness.  Hematological: Does not bruise/bleed easily.  Psychiatric/Behavioral: Negative.    Per HPI unless specifically indicated above     Objective:    BP 114/72 (BP Location: Right Wrist, Patient Position: Sitting, Cuff Size: Normal)   Pulse 63   Temp 98.1 F (36.7 C)   Wt (!) 324 lb (147 kg)   SpO2 94%   BMI 53.92 kg/m   Wt Readings from Last 3 Encounters:  02/06/17 (!) 324 lb (147 kg)  12/13/16 (!) 319 lb 9.6 oz (145 kg)  12/13/16 (!) 319 lb 9.6 oz (145 kg)    Physical Exam  Constitutional: She is oriented to person, place, and time. She appears well-developed and  well-nourished. No distress.  HENT:  Head: Atraumatic.  Eyes: Pupils are equal, round, and reactive to light. Conjunctivae are normal.  Neck: Normal range of motion. Neck supple.  Cardiovascular: Normal rate.   Pulmonary/Chest: Effort normal and breath sounds normal. No respiratory distress.  Musculoskeletal: Normal range of motion.  Neurological: She is alert and oriented to person, place, and time.  Skin: Skin is warm and dry.  Psychiatric: She has a normal mood and affect. Her behavior is normal.  Nursing note and vitals reviewed.   Results for orders placed or performed in visit on 02/06/17  CoaguChek XS/INR Waived  Result Value Ref Range   INR 2.4 (H) 0.9 - 1.1   Prothrombin Time 28.5 sec  Comprehensive metabolic panel  Result Value Ref Range   Glucose 103 (H) 65 - 99  mg/dL   BUN 22 8 - 27 mg/dL   Creatinine, Ser 1.14 (H) 0.57 - 1.00 mg/dL   GFR calc non Af Amer 48 (L) >59 mL/min/1.73   GFR calc Af Amer 56 (L) >59 mL/min/1.73   BUN/Creatinine Ratio 19 12 - 28   Sodium 143 134 - 144 mmol/L   Potassium 3.9 3.5 - 5.2 mmol/L   Chloride 101 96 - 106 mmol/L   CO2 28 20 - 29 mmol/L   Calcium 9.0 8.7 - 10.3 mg/dL   Total Protein 6.8 6.0 - 8.5 g/dL   Albumin 4.2 3.5 - 4.8 g/dL   Globulin, Total 2.6 1.5 - 4.5 g/dL   Albumin/Globulin Ratio 1.6 1.2 - 2.2   Bilirubin Total 0.5 0.0 - 1.2 mg/dL   Alkaline Phosphatase 71 39 - 117 IU/L   AST 21 0 - 40 IU/L   ALT 9 0 - 32 IU/L      Assessment & Plan:   Problem List Items Addressed This Visit      Cardiovascular and Mediastinum   Hypertension    Suspect dizzy spells d/t fairly low BP and HR, possibly over-treatment. Unable to get orthostatics today due to mobility limitations. Will reduce benicar in half and monitor closely for symptom benefit. Pt opting to hold off on EKG today, but if no improvement at f/u will have one done to r/o arrhythmias causing her dizzy spells.       Relevant Orders   Basic Metabolic Panel (BMET)   Comprehensive metabolic panel (Completed)   Atrial fibrillation ([place]) - Primary    INR improved to 2.4 and is at goal with 7.5 mg coumadin daily. Continue current regimen.       Relevant Orders   CoaguChek XS/INR Waived (Completed)   Comprehensive metabolic panel (Completed)       Follow up plan: Return in about 6 weeks (around 03/20/2017) for INR.

## 2017-02-06 NOTE — Patient Instructions (Signed)
Start taking half the dose of the Benicar HCT - 20 mg/12.5  Continue coumadin at 1 tablet daily  Follow up in 6 weeks

## 2017-02-07 ENCOUNTER — Encounter: Payer: Self-pay | Admitting: Family Medicine

## 2017-02-07 LAB — COMPREHENSIVE METABOLIC PANEL
ALBUMIN: 4.2 g/dL (ref 3.5–4.8)
ALK PHOS: 71 IU/L (ref 39–117)
ALT: 9 IU/L (ref 0–32)
AST: 21 IU/L (ref 0–40)
Albumin/Globulin Ratio: 1.6 (ref 1.2–2.2)
BILIRUBIN TOTAL: 0.5 mg/dL (ref 0.0–1.2)
BUN/Creatinine Ratio: 19 (ref 12–28)
BUN: 22 mg/dL (ref 8–27)
CO2: 28 mmol/L (ref 20–29)
CREATININE: 1.14 mg/dL — AB (ref 0.57–1.00)
Calcium: 9 mg/dL (ref 8.7–10.3)
Chloride: 101 mmol/L (ref 96–106)
GFR calc non Af Amer: 48 mL/min/{1.73_m2} — ABNORMAL LOW (ref >59)
GFR, EST AFRICAN AMERICAN: 56 mL/min/{1.73_m2} — AB (ref >59)
GLOBULIN, TOTAL: 2.6 g/dL (ref 1.5–4.5)
Glucose: 103 mg/dL — ABNORMAL HIGH (ref 65–99)
Potassium: 3.9 mmol/L (ref 3.5–5.2)
SODIUM: 143 mmol/L (ref 134–144)
Total Protein: 6.8 g/dL (ref 6.0–8.5)

## 2017-02-09 NOTE — Assessment & Plan Note (Signed)
INR improved to 2.4 and is at goal with 7.5 mg coumadin daily. Continue current regimen.

## 2017-02-09 NOTE — Assessment & Plan Note (Signed)
Suspect dizzy spells d/t fairly low BP and HR, possibly over-treatment. Unable to get orthostatics today due to mobility limitations. Will reduce benicar in half and monitor closely for symptom benefit. Pt opting to hold off on EKG today, but if no improvement at f/u will have one done to r/o arrhythmias causing her dizzy spells.

## 2017-02-10 ENCOUNTER — Other Ambulatory Visit: Payer: Self-pay | Admitting: Family Medicine

## 2017-02-11 NOTE — Telephone Encounter (Signed)
Can't be refilled by [facility] per the protocol.

## 2017-02-19 DIAGNOSIS — R05 Cough: Secondary | ICD-10-CM | POA: Diagnosis not present

## 2017-02-19 DIAGNOSIS — I2721 Secondary pulmonary arterial hypertension: Secondary | ICD-10-CM | POA: Diagnosis not present

## 2017-02-19 DIAGNOSIS — R0602 Shortness of breath: Secondary | ICD-10-CM | POA: Diagnosis not present

## 2017-02-19 DIAGNOSIS — G4733 Obstructive sleep apnea (adult) (pediatric): Secondary | ICD-10-CM | POA: Diagnosis not present

## 2017-02-21 ENCOUNTER — Telehealth: Payer: Self-pay | Admitting: Family Medicine

## 2017-02-21 NOTE — Telephone Encounter (Signed)
Routing this to you because I'm seeing 2 different doses.   Didn't know which one to approve.one is 40-25 and the other is for 20-12.5

## 2017-02-22 NOTE — Telephone Encounter (Signed)
As documented in med list and most recent OV, dose was lowered to benicar 20-12.5 and the higher dose was d/c'd. She shouldn't be due either way as I just gave her a 6 month supply.

## 2017-02-26 DIAGNOSIS — J449 Chronic obstructive pulmonary disease, unspecified: Secondary | ICD-10-CM | POA: Diagnosis not present

## 2017-02-26 DIAGNOSIS — I272 Pulmonary hypertension, unspecified: Secondary | ICD-10-CM | POA: Diagnosis not present

## 2017-02-26 DIAGNOSIS — G4733 Obstructive sleep apnea (adult) (pediatric): Secondary | ICD-10-CM | POA: Diagnosis not present

## 2017-03-14 ENCOUNTER — Other Ambulatory Visit: Payer: Self-pay | Admitting: Family Medicine

## 2017-03-19 DIAGNOSIS — H2513 Age-related nuclear cataract, bilateral: Secondary | ICD-10-CM | POA: Diagnosis not present

## 2017-03-20 ENCOUNTER — Ambulatory Visit (INDEPENDENT_AMBULATORY_CARE_PROVIDER_SITE_OTHER): Payer: Medicare HMO | Admitting: Family Medicine

## 2017-03-20 ENCOUNTER — Encounter: Payer: Self-pay | Admitting: Family Medicine

## 2017-03-20 VITALS — BP 109/75 | HR 64 | Wt 324.0 lb

## 2017-03-20 DIAGNOSIS — I4891 Unspecified atrial fibrillation: Secondary | ICD-10-CM

## 2017-03-20 DIAGNOSIS — G8929 Other chronic pain: Secondary | ICD-10-CM

## 2017-03-20 DIAGNOSIS — I10 Essential (primary) hypertension: Secondary | ICD-10-CM | POA: Diagnosis not present

## 2017-03-20 DIAGNOSIS — M25561 Pain in right knee: Secondary | ICD-10-CM

## 2017-03-20 LAB — COAGUCHEK XS/INR WAIVED
INR: 1.4 — AB (ref 0.9–1.1)
Prothrombin Time: 16.4 s

## 2017-03-20 MED ORDER — LIDOCAINE 5 % EX PTCH: 1.0000 | MEDICATED_PATCH | CUTANEOUS | 1 refills | Status: DC

## 2017-03-20 NOTE — Assessment & Plan Note (Addendum)
INR low today at 1.4. Will increase back to 1 tablet and 1.5 tablets alternating as she was previously stable on this long term until the elevated reading several month ago. Recheck in 4 weeks. Advised her to let us know of any bleeding or bruising issues in meantime.

## 2017-03-20 NOTE — Progress Notes (Signed)
BP 109/75   Pulse 64   Wt (!) 324 lb (147 kg)   SpO2 96%   BMI 53.92 kg/m    Subjective:    Patient ID: Michelle Jenkins, female    DOB: 1945-05-15, 71 y.o.   MRN: [id number]  HPI: Michelle Jenkins is a 71 y.o. female  No chief complaint on file.  Patient here for INR check. Dose was reduced several visits ago to 7.5 mg daily as her INR elevated to 4.2 after previously being stable. There were no known provoking factors for this. INR was stable at last visit on 7.5 mg daily at 2.4. No bleeding or bruising issues, dietary changes, taking medication faithfully.   Right knee and right elbow pain that are chronic. Has seen orthopedics in the past but was told she was not a candidate for replacement surgery until significant weight loss occurred. Pain is worst in the morning.   Benicar was decreased by half due to hypotension and dizziness, dizziness is much improved since. BPs have been stable and WNL. Leg swelling stable on 20 mg lasix BID.   Past Medical History:  Diagnosis Date  . Anginal pain ([place])   . Asthma   . Bronchitis   . CAD (coronary artery disease)   . CHF (congestive heart failure) ([place])   . CVA (cerebral infarction)   . Dysrhythmia   . GERD (gastroesophageal reflux disease)   . History of chronic atrial fibrillation   . Hypercholesteremia   . Hypertension   . Idiopathic pulmonary hypertension ([place])   . Myocardial infarction ([place])   . Obesity   . OSA (obstructive sleep apnea)   . Pre-diabetes   . Pulmonary artery hypertension ([place])   . Shortness of breath dyspnea   . Sleep apnea   . Stroke [facility])    Social History   Socioeconomic History  . Marital status: Single    Spouse name: Not on file  . Number of children: Not on file  . Years of education: Not on file  . Highest education level: Not on file  Social Needs  . Financial resource strain: Not on file  . Food insecurity - worry: Not on file  . Food insecurity - inability: Not on file  .  Transportation needs - medical: Not on file  . Transportation needs - non-medical: Not on file  Occupational History  . Not on file  Tobacco Use  . Smoking status: Former Smoker    Last attempt to quit: 06/15/2014    Years since quitting: 2.7  . Smokeless tobacco: Never Used  Substance and Sexual Activity  . Alcohol use: Yes    Alcohol/week: 0.0 - 0.6 oz    Comment: occasionally  . Drug use: No  . Sexual activity: Not on file  Other Topics Concern  . Not on file  Social History Narrative  . Not on file   Relevant past medical, surgical, family and social history reviewed and updated as indicated. Interim medical history since our last visit reviewed. Allergies and medications reviewed and updated.  Review of Systems  Constitutional: Negative.   Respiratory: Negative.   Cardiovascular: Negative.   Gastrointestinal: Negative.   Genitourinary: Negative.   Musculoskeletal: Negative.   Skin: Negative.   Neurological: Negative.   Hematological: Does not bruise/bleed easily.  Psychiatric/Behavioral: Negative.    Per HPI unless specifically indicated above     Objective:    BP 109/75   Pulse 64   Wt (!) 324 lb (147  kg)   SpO2 96%   BMI 53.92 kg/m   Wt Readings from Last 3 Encounters:  03/20/17 (!) 324 lb (147 kg)  02/06/17 (!) 324 lb (147 kg)  12/13/16 (!) 319 lb 9.6 oz (145 kg)    Physical Exam  Constitutional: She is oriented to person, place, and time. She appears well-developed and well-nourished. No distress.  HENT:  Head: Atraumatic.  Eyes: Conjunctivae are normal. Pupils are equal, round, and reactive to light. No scleral icterus.  Neck: Normal range of motion. Neck supple.  Cardiovascular: Normal rate, regular rhythm and normal heart sounds.  Pulmonary/Chest: Effort normal and breath sounds normal. No respiratory distress.  Musculoskeletal: Normal range of motion.  Lymphadenopathy:    She has no cervical adenopathy.  Neurological: She is alert and oriented  to person, place, and time.  Skin: Skin is warm and dry.  Psychiatric: She has a normal mood and affect. Her behavior is normal.  Nursing note and vitals reviewed.  Results for orders placed or performed in visit on 02/06/17  CoaguChek XS/INR Waived  Result Value Ref Range   INR 2.4 (H) 0.9 - 1.1   Prothrombin Time 28.5 sec  Comprehensive metabolic panel  Result Value Ref Range   Glucose 103 (H) 65 - 99 mg/dL   BUN 22 8 - 27 mg/dL   Creatinine, Ser 1.14 (H) 0.57 - 1.00 mg/dL   GFR calc non Af Amer 48 (L) >59 mL/min/1.73   GFR calc Af Amer 56 (L) >59 mL/min/1.73   BUN/Creatinine Ratio 19 12 - 28   Sodium 143 134 - 144 mmol/L   Potassium 3.9 3.5 - 5.2 mmol/L   Chloride 101 96 - 106 mmol/L   CO2 28 20 - 29 mmol/L   Calcium 9.0 8.7 - 10.3 mg/dL   Total Protein 6.8 6.0 - 8.5 g/dL   Albumin 4.2 3.5 - 4.8 g/dL   Globulin, Total 2.6 1.5 - 4.5 g/dL   Albumin/Globulin Ratio 1.6 1.2 - 2.2   Bilirubin Total 0.5 0.0 - 1.2 mg/dL   Alkaline Phosphatase 71 39 - 117 IU/L   AST 21 0 - 40 IU/L   ALT 9 0 - 32 IU/L      Assessment & Plan:   Problem List Items Addressed This Visit      Cardiovascular and Mediastinum   Hypertension    Stable and WNL, without further dizziness. Continue current regimen      Atrial fibrillation ([facility]) - Primary    INR low today at 1.4. Will increase back to 1 tablet and 1.5 tablets alternating as she was previously stable on this long term until the elevated reading several month ago. Recheck in 4 weeks. Advised her to let us know of any bleeding or bruising issues in meantime.       Relevant Orders   CoaguChek XS/INR Waived    Other Visit Diagnoses    Chronic pain of right knee       Was told she can't have joint replacement until she loses 60 lbs. Start lidocaine patches prn. Work on lifestyle modifications      Follow up plan: Return in about 4 weeks (around 04/17/2017) for INR.

## 2017-03-20 NOTE — Assessment & Plan Note (Signed)
Stable and WNL, without further dizziness. Continue current regimen

## 2017-03-20 NOTE — Patient Instructions (Signed)
Increase coumadin dose back to 1 tablet and 1.5 tablets alternating days - we will recheck you in about a month.

## 2017-03-22 ENCOUNTER — Telehealth: Payer: Self-pay | Admitting: Family Medicine

## 2017-03-22 NOTE — Telephone Encounter (Signed)
-----   Message from Sandria Manly, [place] sent at 03/22/2017 11:19 AM EST ----- Regarding: Denial FYI. Insurance denied lidocaine.

## 2017-03-22 NOTE — Telephone Encounter (Signed)
Patient notified

## 2017-03-22 NOTE — Telephone Encounter (Signed)
Can you call and let her know and also recommend the OTC version?

## 2017-03-26 ENCOUNTER — Other Ambulatory Visit: Payer: Self-pay | Admitting: Family Medicine

## 2017-03-28 ENCOUNTER — Telehealth: Payer: Self-pay | Admitting: Family Medicine

## 2017-03-28 DIAGNOSIS — G4733 Obstructive sleep apnea (adult) (pediatric): Secondary | ICD-10-CM | POA: Diagnosis not present

## 2017-03-28 DIAGNOSIS — J449 Chronic obstructive pulmonary disease, unspecified: Secondary | ICD-10-CM | POA: Diagnosis not present

## 2017-03-28 DIAGNOSIS — I272 Pulmonary hypertension, unspecified: Secondary | ICD-10-CM | POA: Diagnosis not present

## 2017-03-28 NOTE — Telephone Encounter (Signed)
If sleep study was done within the last 3 years, she should be able to contact her previous provider who performed the study. If they are unable to get her new equipment, I will order a new referral.

## 2017-03-28 NOTE — Telephone Encounter (Signed)
Copied from [place]. Topic: Inquiry >> Mar 28, 2017 12:20 PM Corie Chiquito, [place] wrote: Reason for CRM: Patient called because she needs a new C-PAP machine. If someone could please give her a call back about this please at [phone]

## 2017-03-28 NOTE — Telephone Encounter (Signed)
Patient stated she hasn't had a sleep study in last 10 years. Please enter new referral. Patient went to feeling great.

## 2017-03-28 NOTE — Telephone Encounter (Signed)
Routing to provider to advise.  Does she need a new sleep study? And does she need to contact who did her previous sleep study?

## 2017-03-29 NOTE — Telephone Encounter (Signed)
Referral generated

## 2017-04-08 LAB — LIPID PANEL PICCOLO, WAIVED
CHOL/HDL RATIO PICCOLO,WAIVE: 1.8 mg/dL
Cholesterol Piccolo, Waived: 154 mg/dL (ref <200)
HDL Chol Piccolo, Waived: 85 mg/dL (ref >59)
LDL CHOL CALC PICCOLO WAIVED: 60 mg/dL (ref <100)
TRIGLYCERIDES PICCOLO,WAIVED: 46 mg/dL (ref <150)
VLDL Chol Calc Piccolo,Waive: 9 mg/dL (ref <30)

## 2017-04-17 ENCOUNTER — Ambulatory Visit (INDEPENDENT_AMBULATORY_CARE_PROVIDER_SITE_OTHER): Payer: Medicare HMO | Admitting: Family Medicine

## 2017-04-17 ENCOUNTER — Other Ambulatory Visit: Payer: Self-pay | Admitting: Family Medicine

## 2017-04-17 VITALS — BP 119/77 | HR 76 | Wt 324.0 lb

## 2017-04-17 DIAGNOSIS — I4891 Unspecified atrial fibrillation: Secondary | ICD-10-CM

## 2017-04-17 LAB — COAGUCHEK XS/INR WAIVED
INR: 2.6 — AB (ref 0.9–1.1)
PROTHROMBIN TIME: 30.9 s

## 2017-04-17 NOTE — Assessment & Plan Note (Signed)
INR stable today at 2.6. Continue current regimen of 1 tab and 1.5 tabs alternating.

## 2017-04-17 NOTE — Progress Notes (Signed)
   BP 119/77   Pulse 76   Wt (!) 324 lb (147 kg)   SpO2 90%   BMI 53.92 kg/m    Subjective:    Patient ID: Michelle Jenkins, female    DOB: 08/24/1945, 72 y.o.   MRN: [id number]  HPI: [name] is a 72 y.o. female  Chief Complaint  Patient presents with  . Coagulation Disorder   Pt here today for 6 week f/u INR. Alternating 1 tab and 1.5 mg (7.5 mg tabs) daily on her warfarin. No bleeding or bruising issues noted. Diet and medications stable, no changes. Denies CP, palpitations. Is having worsening DOE, working with Pulmonology on that and on continuous nasal cannula currently.   Relevant past medical, surgical, family and social history reviewed and updated as indicated. Interim medical history since our last visit reviewed. Allergies and medications reviewed and updated.  Review of Systems  Constitutional: Negative.   Respiratory: Positive for shortness of breath (on continuous O2).   Cardiovascular: Negative.   Gastrointestinal: Negative.   Musculoskeletal: Negative.   Neurological: Negative.   Psychiatric/Behavioral: Negative.     Per HPI unless specifically indicated above     Objective:    BP 119/77   Pulse 76   Wt (!) 324 lb (147 kg)   SpO2 90%   BMI 53.92 kg/m   Wt Readings from Last 3 Encounters:  04/17/17 (!) 324 lb (147 kg)  03/20/17 (!) 324 lb (147 kg)  02/06/17 (!) 324 lb (147 kg)    Physical Exam  Constitutional: She is oriented to person, place, and time. She appears well-developed and well-nourished. No distress.  HENT:  Head: Atraumatic.  Eyes: Conjunctivae are normal. Pupils are equal, round, and reactive to light. No scleral icterus.  Neck: Normal range of motion. Neck supple.  Cardiovascular: Normal rate and normal heart sounds.  Pulmonary/Chest: Effort normal and breath sounds normal.  On continuous nasal cannula  Musculoskeletal: Normal range of motion.  Neurological: She is alert and oriented to person, place, and time.  Skin:  Skin is warm and dry.  Psychiatric: She has a normal mood and affect. Her behavior is normal.  Nursing note and vitals reviewed.  Results for orders placed or performed in visit on 04/17/17  CoaguChek XS/INR Waived  Result Value Ref Range   INR 2.6 (H) 0.9 - 1.1   Prothrombin Time 30.9 sec      Assessment & Plan:   Problem List Items Addressed This Visit      Cardiovascular and Mediastinum   Atrial fibrillation ([place]) - Primary    INR stable today at 2.6. Continue current regimen of 1 tab and 1.5 tabs alternating.       Relevant Orders   CoaguChek XS/INR Waived (Completed)       Follow up plan: Return in about 6 weeks (around 05/29/2017) for INR.

## 2017-04-19 DIAGNOSIS — Z6841 Body Mass Index (BMI) 40.0 and over, adult: Secondary | ICD-10-CM | POA: Diagnosis not present

## 2017-04-19 DIAGNOSIS — R0609 Other forms of dyspnea: Secondary | ICD-10-CM | POA: Diagnosis not present

## 2017-04-19 DIAGNOSIS — I272 Pulmonary hypertension, unspecified: Secondary | ICD-10-CM | POA: Diagnosis not present

## 2017-04-19 DIAGNOSIS — G4733 Obstructive sleep apnea (adult) (pediatric): Secondary | ICD-10-CM | POA: Diagnosis not present

## 2017-04-20 ENCOUNTER — Encounter: Payer: Self-pay | Admitting: Family Medicine

## 2017-04-20 NOTE — Patient Instructions (Signed)
Follow-up in 6 weeks

## 2017-04-28 DIAGNOSIS — G4733 Obstructive sleep apnea (adult) (pediatric): Secondary | ICD-10-CM | POA: Diagnosis not present

## 2017-04-28 DIAGNOSIS — I272 Pulmonary hypertension, unspecified: Secondary | ICD-10-CM | POA: Diagnosis not present

## 2017-04-28 DIAGNOSIS — J449 Chronic obstructive pulmonary disease, unspecified: Secondary | ICD-10-CM | POA: Diagnosis not present

## 2017-05-06 ENCOUNTER — Telehealth: Payer: Self-pay | Admitting: Family Medicine

## 2017-05-06 DIAGNOSIS — G4733 Obstructive sleep apnea (adult) (pediatric): Secondary | ICD-10-CM | POA: Diagnosis not present

## 2017-05-06 DIAGNOSIS — I272 Pulmonary hypertension, unspecified: Secondary | ICD-10-CM | POA: Diagnosis not present

## 2017-05-06 DIAGNOSIS — J449 Chronic obstructive pulmonary disease, unspecified: Secondary | ICD-10-CM | POA: Diagnosis not present

## 2017-05-06 NOTE — Telephone Encounter (Signed)
Spoke with patient. Patient stated she was contacted that insurance wouldn't pay for her to stay over night for a sleep study but would pay for a new CPAP since hers was broken. Patient explained that she was told her device would be dropped off her and she could pick it up. I explained to patient we've never received a CPAP machine for patient to pick-up. Explained to patient that I would contact [facility] and/or [facility] for further details 05/07/2017 and get back in touch with her.  Patient understood.

## 2017-05-06 NOTE — Telephone Encounter (Signed)
Copied from [place] [phone]. Topic: Quick Communication - See Telephone Encounter >> May 06, 2017 10:13 AM Ahmed Prima L wrote: CRM for notification. See Telephone encounter for:   05/06/17.   Patient said [facility] was going to give her a sleep apnea machine to the office. Patient said she is following up on this. Call back is [phone]

## 2017-05-06 NOTE — Telephone Encounter (Signed)
I am under the impression [facility] sends to pt's home - we shouldn't have anything to do with it unless I'm wrong. She should reach out to [facility] about where and when it was sent I suppose

## 2017-05-07 NOTE — Telephone Encounter (Signed)
Tried calling [facility], all receptionists busy. Will try again later.

## 2017-05-09 NOTE — Telephone Encounter (Signed)
Spoke with Michelle Jenkins from [employer]. They spoke with Michelle Jenkins that her insurance denied her having a sleep study, but she would be approved for an APAP machine. Michelle Jenkins stated it was documented a CMN had been faxed for Michelle Jenkins to sign but I haven't received one for Michelle Jenkins. Asked her to write attention to [facility] and fax again and I would get Michelle Jenkins to sign ASAP. After the CMN would be signed, they'd run her insurance again and call her to set up an appointment. But they have never told a Michelle Jenkins that equipment would be delivered to another doctors office.   Spoke with Michelle Jenkins and notified of the above. Michelle Jenkins asked if she had to have an appointment and I said yes, with a doctor from [facility]. Michelle Jenkins asked where it was and I told her it would be at the [facility] in [place] but Michelle Jenkins had to go to an appointment to get the machine. Explained to Michelle Jenkins as soon as we get the form signed and faxed back, they'll call her to schedule and hopefully she'll hear something in the next couple of days. Michelle Jenkins understood. Michelle Jenkins with faxes notified to keep a lookout for the fax from [facility].  Routing to provider as Juluis Rainier.

## 2017-05-21 ENCOUNTER — Telehealth: Payer: Self-pay | Admitting: Family Medicine

## 2017-05-21 NOTE — Telephone Encounter (Signed)
Copied from [place] [phone]. Topic: Quick Communication - See Telephone Encounter >> May 21, 2017  3:35 PM Synthia Innocent wrote: CRM for notification. See Telephone encounter for: Needs letter stating she is unable to walk to mailbox, with the letter they will move her mailbox closer to her house.   05/21/17.

## 2017-05-22 NOTE — Telephone Encounter (Signed)
OK to generate letter?

## 2017-05-22 NOTE — Telephone Encounter (Signed)
Routing to provider  

## 2017-05-22 NOTE — Telephone Encounter (Signed)
I tried calling patient to see where to send the letter to and what exactly it needs to say. If this is a state thing, it may be something completely different.  Will try patient again later.  Please route back.

## 2017-05-23 ENCOUNTER — Telehealth: Payer: Self-pay | Admitting: Family Medicine

## 2017-05-23 NOTE — Telephone Encounter (Signed)
I have not received a fax on this

## 2017-05-23 NOTE — Telephone Encounter (Signed)
I've done some research and it looks like this is something she needs to address with the post office.

## 2017-05-23 NOTE — Telephone Encounter (Signed)
Any ideas on this? I've not received any refill requests

## 2017-05-23 NOTE — Telephone Encounter (Signed)
Copied from [place]. Topic: Quick Communication - See Telephone Encounter >> May 23, 2017 12:00 PM Ether Griffins B wrote: CRM for notification. See Telephone encounter for:  Pt states Merrie Roof needs to call [id number] [place] [place] about her medication she needs refilled. She thinks its Benicar but unsure. They have the medication but Orene Desanctis hasnt signed of on it.  05/23/17.

## 2017-05-23 NOTE — Telephone Encounter (Signed)
Please call pt and let her know that she should call and verify with the pharmacy what she is needing refilled. I have not received any refill requests

## 2017-05-24 NOTE — Telephone Encounter (Signed)
Patient stated that when she went to the pharmacy they stated RL needed to sign off on one of her prescriptions before she could have it. I went through med list and I assumed it was the Tramadol. Patient said she doesn't take that anymore and she has all of her medications she needs. She was just confused.   Also spoke with patient about the mailbox being moved. Patient stated that she went to the post office and was told all she needed was a letter. After doing research, there has to be paperwork and an approval from a [employer] before mailbox being moved. Patient said she had a lot going on right now and was going to wait on the mailbox.  Explained to patient to call if she had any other questions or concerns. Patient understood.  Routing to provider as Juluis Rainier.

## 2017-05-24 NOTE — Telephone Encounter (Signed)
Please see other telephone encounter from 05/21/2017 for documentation.

## 2017-05-29 DIAGNOSIS — J449 Chronic obstructive pulmonary disease, unspecified: Secondary | ICD-10-CM | POA: Diagnosis not present

## 2017-05-29 DIAGNOSIS — G4733 Obstructive sleep apnea (adult) (pediatric): Secondary | ICD-10-CM | POA: Diagnosis not present

## 2017-05-29 DIAGNOSIS — I272 Pulmonary hypertension, unspecified: Secondary | ICD-10-CM | POA: Diagnosis not present

## 2017-05-30 ENCOUNTER — Ambulatory Visit: Payer: Self-pay | Admitting: Family Medicine

## 2017-05-30 ENCOUNTER — Other Ambulatory Visit: Payer: Self-pay | Admitting: Family Medicine

## 2017-06-26 ENCOUNTER — Other Ambulatory Visit: Payer: Self-pay | Admitting: Family Medicine

## 2017-06-26 DIAGNOSIS — G4733 Obstructive sleep apnea (adult) (pediatric): Secondary | ICD-10-CM | POA: Diagnosis not present

## 2017-06-26 DIAGNOSIS — J449 Chronic obstructive pulmonary disease, unspecified: Secondary | ICD-10-CM | POA: Diagnosis not present

## 2017-06-26 DIAGNOSIS — I272 Pulmonary hypertension, unspecified: Secondary | ICD-10-CM | POA: Diagnosis not present

## 2017-07-09 ENCOUNTER — Encounter: Payer: Self-pay | Admitting: Family Medicine

## 2017-07-09 ENCOUNTER — Ambulatory Visit (INDEPENDENT_AMBULATORY_CARE_PROVIDER_SITE_OTHER): Payer: Medicare HMO | Admitting: Family Medicine

## 2017-07-09 VITALS — BP 94/61 | HR 66 | Temp 98.2°F | Wt 328.5 lb

## 2017-07-09 DIAGNOSIS — I4891 Unspecified atrial fibrillation: Secondary | ICD-10-CM

## 2017-07-09 LAB — COAGUCHEK XS/INR WAIVED
INR: 1.7 — AB (ref 0.9–1.1)
Prothrombin Time: 20 s

## 2017-07-09 NOTE — Patient Instructions (Signed)
Follow-up in 2 weeks

## 2017-07-09 NOTE — Assessment & Plan Note (Signed)
INR below goal today at 1.7 but pt has noted poor compliance lately. Will recheck in 2 weeks, continue alternating 1 tab and 1.5 tab as before as this has been a fairly stable dose for her historically. Keep diet stable.

## 2017-07-09 NOTE — Progress Notes (Signed)
   BP 94/61 (BP Location: Left Arm, Patient Position: Sitting, Cuff Size: Large)   Pulse 66   Temp 98.2 F (36.8 C) (Oral)   Wt (!) 328 lb 8 oz (149 kg)   SpO2 98%   BMI 54.67 kg/m    Subjective:    Patient ID: Michelle Jenkins, female    DOB: 07-08-1945, 72 y.o.   MRN: [id number]  HPI: Michelle Jenkins is a 72 y.o. female  Chief Complaint  Patient presents with  . Coagulation Disorder   Pt here today for INR, missed last appt due to insurance issues. Alternating 1 tab with 1.5 tabs coumadin. Notes diet has been abnormal from visitors being in town and she thinks she's missed a few pills. Last INR was 2.6 back in January. One new medication added from Pulmonologist since last check - the adempas 1 mg tabs.  Denies CP, palpitations, bleeding or bruising issues.   Relevant past medical, surgical, family and social history reviewed and updated as indicated. Interim medical history since our last visit reviewed. Allergies and medications reviewed and updated.  Review of Systems  Per HPI unless specifically indicated above     Objective:    BP 94/61 (BP Location: Left Arm, Patient Position: Sitting, Cuff Size: Large)   Pulse 66   Temp 98.2 F (36.8 C) (Oral)   Wt (!) 328 lb 8 oz (149 kg)   SpO2 98%   BMI 54.67 kg/m   Wt Readings from Last 3 Encounters:  07/09/17 (!) 328 lb 8 oz (149 kg)  04/17/17 (!) 324 lb (147 kg)  03/20/17 (!) 324 lb (147 kg)    Physical Exam  Constitutional: She is oriented to person, place, and time. She appears well-developed and well-nourished.  HENT:  Head: Atraumatic.  Eyes: Pupils are equal, round, and reactive to light. Conjunctivae are normal.  Neck: Normal range of motion. Neck supple.  Cardiovascular: Normal rate.  Pulmonary/Chest: Effort normal. No respiratory distress.  On continuous nasal cannula now  Musculoskeletal: Normal range of motion.  Neurological: She is alert and oriented to person, place, and time.  Skin: Skin is warm and  dry.  Psychiatric: She has a normal mood and affect. Her behavior is normal.  Nursing note and vitals reviewed.   Results for orders placed or performed in visit on 07/09/17  CoaguChek XS/INR Waived  Result Value Ref Range   INR 1.7 (H) 0.9 - 1.1   Prothrombin Time 20.0 sec      Assessment & Plan:   Problem List Items Addressed This Visit      Cardiovascular and Mediastinum   Atrial fibrillation ([place]) - Primary    INR below goal today at 1.7 but pt has noted poor compliance lately. Will recheck in 2 weeks, continue alternating 1 tab and 1.5 tab as before as this has been a fairly stable dose for her historically. Keep diet stable.       Relevant Medications   ADEMPAS 1 MG TABS   Other Relevant Orders   CoaguChek XS/INR Waived (Completed)       Follow up plan: Return in about 2 weeks (around 07/23/2017) for INR, 6 month f/u lipid, CMP, .

## 2017-07-12 ENCOUNTER — Telehealth: Payer: Self-pay | Admitting: Family Medicine

## 2017-07-12 ENCOUNTER — Other Ambulatory Visit: Payer: Self-pay | Admitting: Family Medicine

## 2017-07-12 NOTE — Telephone Encounter (Signed)
Copied from [place] [id number]. Topic: Quick Communication - Rx Refill/Question >> Jul 12, 2017  5:03 PM Neva Seat wrote: ezetimibe (ZETIA) 10 MG tablet  Pt needing refills   [facility] [address] [place] [address] Phone: [phone] Fax: [phone]

## 2017-07-12 NOTE — Telephone Encounter (Signed)
Request for refill of Ezetimibe(Zetia) 10mg  tab. Previously filled by historical provider.    LOV:07/09/17  Rachel Lane,PA  [facility]   [place]    [address]

## 2017-07-15 ENCOUNTER — Other Ambulatory Visit: Payer: Self-pay | Admitting: Family Medicine

## 2017-07-15 MED ORDER — EZETIMIBE 10 MG PO TABS: 10.0000 mg | ORAL_TABLET | Freq: Every day | ORAL | 1 refills | Status: DC

## 2017-07-15 NOTE — Telephone Encounter (Signed)
Rx sent 

## 2017-07-16 ENCOUNTER — Other Ambulatory Visit: Payer: Self-pay | Admitting: Family Medicine

## 2017-07-22 ENCOUNTER — Encounter: Payer: Self-pay | Admitting: Family Medicine

## 2017-07-22 ENCOUNTER — Telehealth: Payer: Self-pay | Admitting: Family Medicine

## 2017-07-22 ENCOUNTER — Ambulatory Visit (INDEPENDENT_AMBULATORY_CARE_PROVIDER_SITE_OTHER): Payer: Medicare HMO | Admitting: Family Medicine

## 2017-07-22 VITALS — BP 120/73 | HR 75 | Temp 98.4°F | Wt 323.3 lb

## 2017-07-22 DIAGNOSIS — E7800 Pure hypercholesterolemia, unspecified: Secondary | ICD-10-CM

## 2017-07-22 DIAGNOSIS — I270 Primary pulmonary hypertension: Secondary | ICD-10-CM | POA: Diagnosis not present

## 2017-07-22 DIAGNOSIS — I10 Essential (primary) hypertension: Secondary | ICD-10-CM

## 2017-07-22 DIAGNOSIS — I4891 Unspecified atrial fibrillation: Secondary | ICD-10-CM | POA: Diagnosis not present

## 2017-07-22 LAB — COAGUCHEK XS/INR WAIVED
INR: 1.9 — ABNORMAL HIGH (ref 0.9–1.1)
Prothrombin Time: 22.4 s

## 2017-07-22 MED ORDER — PANTOPRAZOLE SODIUM 40 MG PO TBEC: 40.0000 mg | DELAYED_RELEASE_TABLET | Freq: Every day | ORAL | 1 refills | Status: DC

## 2017-07-22 NOTE — Telephone Encounter (Signed)
Copied from [place]. Topic: Quick Communication - Rx Refill/Question >> Jul 22, 2017 12:01 PM Lennox Solders wrote: Medication: simvastatin 40 mg . Pharm [facility] [place] phone number [phone]. Pt just saw rachel lane this morning

## 2017-07-22 NOTE — Progress Notes (Signed)
BP 120/73 (BP Location: Right Arm, Patient Position: Sitting, Cuff Size: Large)   Pulse 75   Temp 98.4 F (36.9 C) (Oral)   Wt (!) 323 lb 4.8 oz (146.6 kg)   SpO2 94%   BMI 53.80 kg/m    Subjective:    Patient ID: Michelle Jenkins, female    DOB: January 01, 1946, 72 y.o.   MRN: [id number]  HPI: Michelle Jenkins is a 72 y.o. female  Chief Complaint  Patient presents with  . Atrial Fibrillation  . Hypertension   Here today for 2 week INR f/u as previous INR was below goal at 1.7. Pt had reported poor medication and dietary compliance at that time. Previously fairly stable on same dose - alternating 1 and 1.5 tabs of her 7.5 mg coumadin. Denies bleeding or bruising issues, CP, palpitations, SOB. Reports excellent compliance the past 2 weeks.   Seeing Pulmonologist tomorrow to f/u on adempas. Feels jittery in her insides since starting the medication. Also having a sore throat.   No longer on simvastatin, was switched to zetia per patient but she is unsure why. Denies claudication, myalgias. Does not follow a strict diet and does not exercise due to pulmonary issues and significant SOB.   BPs remain stable when checked at home, denies side effects with medication or HAs, dizziness, visual changes.   Past Medical History:  Diagnosis Date  . Anginal pain ([place])   . Asthma   . Bronchitis   . CAD (coronary artery disease)   . CHF (congestive heart failure) ([place])   . CVA (cerebral infarction)   . Dysrhythmia   . GERD (gastroesophageal reflux disease)   . History of chronic atrial fibrillation   . Hypercholesteremia   . Hypertension   . Idiopathic pulmonary hypertension ([place])   . Myocardial infarction ([place])   . Obesity   . OSA (obstructive sleep apnea)   . Pre-diabetes   . Pulmonary artery hypertension ([place])   . Shortness of breath dyspnea   . Sleep apnea   . Stroke [facility])    Social History   Socioeconomic History  . Marital status: Single    Spouse name: Not on file  . Number  of children: Not on file  . Years of education: Not on file  . Highest education level: Not on file  Occupational History  . Not on file  Social Needs  . Financial resource strain: Not on file  . Food insecurity:    Worry: Not on file    Inability: Not on file  . Transportation needs:    Medical: Not on file    Non-medical: Not on file  Tobacco Use  . Smoking status: Former Smoker    Last attempt to quit: 06/15/2014    Years since quitting: 3.1  . Smokeless tobacco: Never Used  Substance and Sexual Activity  . Alcohol use: Yes    Alcohol/week: 0.0 - 0.6 oz    Comment: occasionally  . Drug use: No  . Sexual activity: Not on file  Lifestyle  . Physical activity:    Days per week: Not on file    Minutes per session: Not on file  . Stress: Not on file  Relationships  . Social connections:    Talks on phone: Not on file    Gets together: Not on file    Attends religious service: Not on file    Active member of club or organization: Not on file    Attends meetings of clubs  or organizations: Not on file    Relationship status: Not on file  . Intimate partner violence:    Fear of current or ex partner: Not on file    Emotionally abused: Not on file    Physically abused: Not on file    Forced sexual activity: Not on file  Other Topics Concern  . Not on file  Social History Narrative  . Not on file   Relevant past medical, surgical, family and social history reviewed and updated as indicated. Interim medical history since our last visit reviewed. Allergies and medications reviewed and updated.  Review of Systems  Per HPI unless specifically indicated above     Objective:    BP 120/73 (BP Location: Right Arm, Patient Position: Sitting, Cuff Size: Large)   Pulse 75   Temp 98.4 F (36.9 C) (Oral)   Wt (!) 323 lb 4.8 oz (146.6 kg)   SpO2 94%   BMI 53.80 kg/m   Wt Readings from Last 3 Encounters:  07/22/17 (!) 323 lb 4.8 oz (146.6 kg)  07/09/17 (!) 328 lb 8 oz (149  kg)  04/17/17 (!) 324 lb (147 kg)    Physical Exam  Constitutional: She is oriented to person, place, and time. She appears well-developed and well-nourished. No distress.  HENT:  Head: Atraumatic.  Nose: Nose normal.  Mouth/Throat: Oropharynx is clear and moist. No oropharyngeal exudate.  Eyes: Pupils are equal, round, and reactive to light. Conjunctivae are normal.  Neck: Normal range of motion. Neck supple.  Cardiovascular: Normal rate and normal heart sounds.  Pulmonary/Chest: Effort normal. No respiratory distress.  On nasal cannula   Musculoskeletal: Normal range of motion.  Neurological: She is alert and oriented to person, place, and time.  Skin: Skin is warm and dry.  Psychiatric: She has a normal mood and affect. Her behavior is normal.  Nursing note and vitals reviewed.   Results for orders placed or performed in visit on 07/22/17  CoaguChek XS/INR Waived  Result Value Ref Range   INR 1.9 (H) 0.9 - 1.1   Prothrombin Time 22.4 sec  Comprehensive metabolic panel  Result Value Ref Range   Glucose 93 65 - 99 mg/dL   BUN 24 8 - 27 mg/dL   Creatinine, Ser 1.02 (H) 0.57 - 1.00 mg/dL   GFR calc non Af Amer 55 (L) >59 mL/min/1.73   GFR calc Af Amer 63 >59 mL/min/1.73   BUN/Creatinine Ratio 24 12 - 28   Sodium 144 134 - 144 mmol/L   Potassium 4.1 3.5 - 5.2 mmol/L   Chloride 100 96 - 106 mmol/L   CO2 29 20 - 29 mmol/L   Calcium 9.2 8.7 - 10.3 mg/dL   Total Protein 6.6 6.0 - 8.5 g/dL   Albumin 4.2 3.5 - 4.8 g/dL   Globulin, Total 2.4 1.5 - 4.5 g/dL   Albumin/Globulin Ratio 1.8 1.2 - 2.2   Bilirubin Total 0.4 0.0 - 1.2 mg/dL   Alkaline Phosphatase 65 39 - 117 IU/L   AST 20 0 - 40 IU/L   ALT 17 0 - 32 IU/L  Lipid Panel w/o Chol/HDL Ratio  Result Value Ref Range   Cholesterol, Total 183 100 - 199 mg/dL   Triglycerides 45 0 - 149 mg/dL   HDL 75 >39 mg/dL   VLDL Cholesterol Cal 9 5 - 40 mg/dL   LDL Calculated 99 0 - 99 mg/dL      Assessment & Plan:   Problem List  Items Addressed This Visit  Cardiovascular and Mediastinum   Primary pulmonary hypertension ([place])    Followed by Pulmonology, appt tomorrow to f/u on new medication. Defer to their management      Hypertension    BPs stable and WNL, continue current regimen. Will check CMP today      Relevant Orders   Comprehensive metabolic panel (Completed)   Atrial fibrillation ([place]) - Primary    INR close to goal today at 1.9, will continue current regimen of 1 tab and 1.5 tabs alternating      Relevant Orders   CoaguChek XS/INR Waived (Completed)     Other   Hypercholesteremia    Unclear why she's off a statin, will check lipids today and continue zetia, adjust depending on results      Relevant Orders   Comprehensive metabolic panel (Completed)   Lipid Panel w/o Chol/HDL Ratio (Completed)       Follow up plan: Return in about 1 month (around 08/19/2017) for INR.

## 2017-07-22 NOTE — Telephone Encounter (Signed)
Simvastatin 40mg  not previously prescribed by this provider.   LOV: 07/22/17  Wynona Dove  Elita Quick

## 2017-07-23 DIAGNOSIS — R0902 Hypoxemia: Secondary | ICD-10-CM | POA: Diagnosis not present

## 2017-07-23 DIAGNOSIS — J810 Acute pulmonary edema: Secondary | ICD-10-CM | POA: Diagnosis not present

## 2017-07-23 DIAGNOSIS — R0602 Shortness of breath: Secondary | ICD-10-CM | POA: Diagnosis not present

## 2017-07-23 DIAGNOSIS — I272 Pulmonary hypertension, unspecified: Secondary | ICD-10-CM | POA: Diagnosis not present

## 2017-07-23 DIAGNOSIS — J439 Emphysema, unspecified: Secondary | ICD-10-CM | POA: Diagnosis not present

## 2017-07-23 LAB — COMPREHENSIVE METABOLIC PANEL
ALBUMIN: 4.2 g/dL (ref 3.5–4.8)
ALK PHOS: 65 IU/L (ref 39–117)
ALT: 17 IU/L (ref 0–32)
AST: 20 IU/L (ref 0–40)
Albumin/Globulin Ratio: 1.8 (ref 1.2–2.2)
BUN/Creatinine Ratio: 24 (ref 12–28)
BUN: 24 mg/dL (ref 8–27)
Bilirubin Total: 0.4 mg/dL (ref 0.0–1.2)
CALCIUM: 9.2 mg/dL (ref 8.7–10.3)
CO2: 29 mmol/L (ref 20–29)
CREATININE: 1.02 mg/dL — AB (ref 0.57–1.00)
Chloride: 100 mmol/L (ref 96–106)
GFR, EST AFRICAN AMERICAN: 63 mL/min/{1.73_m2} (ref >59)
GFR, EST NON AFRICAN AMERICAN: 55 mL/min/{1.73_m2} — AB (ref >59)
Globulin, Total: 2.4 g/dL (ref 1.5–4.5)
Glucose: 93 mg/dL (ref 65–99)
Potassium: 4.1 mmol/L (ref 3.5–5.2)
SODIUM: 144 mmol/L (ref 134–144)
Total Protein: 6.6 g/dL (ref 6.0–8.5)

## 2017-07-23 LAB — LIPID PANEL W/O CHOL/HDL RATIO
Cholesterol, Total: 183 mg/dL (ref 100–199)
HDL: 75 mg/dL (ref >39)
LDL CALC: 99 mg/dL (ref 0–99)
Triglycerides: 45 mg/dL (ref 0–149)
VLDL Cholesterol Cal: 9 mg/dL (ref 5–40)

## 2017-07-23 MED ORDER — ATORVASTATIN CALCIUM 20 MG PO TABS: 20.0000 mg | ORAL_TABLET | Freq: Every day | ORAL | 1 refills | Status: DC

## 2017-07-23 NOTE — Telephone Encounter (Signed)
Patient notified

## 2017-07-23 NOTE — Telephone Encounter (Signed)
I'm going to switch her to lipitor as simvastatin has an interaction with her coumadin. I just sent that over. Please let her know that her labs came back normal from yesterday as well

## 2017-07-24 NOTE — Patient Instructions (Signed)
Follow up in 1 month for INR

## 2017-07-24 NOTE — Assessment & Plan Note (Addendum)
INR close to goal today at 1.9, will continue current regimen of 1 tab and 1.5 tabs alternating

## 2017-07-24 NOTE — Assessment & Plan Note (Signed)
Unclear why she's off a statin, will check lipids today and continue zetia, adjust depending on results

## 2017-07-24 NOTE — Assessment & Plan Note (Signed)
BPs stable and WNL, continue current regimen. Will check CMP today

## 2017-07-24 NOTE — Assessment & Plan Note (Signed)
Followed by Pulmonology, appt tomorrow to f/u on new medication. Defer to their management

## 2017-07-27 DIAGNOSIS — G4733 Obstructive sleep apnea (adult) (pediatric): Secondary | ICD-10-CM | POA: Diagnosis not present

## 2017-07-27 DIAGNOSIS — J449 Chronic obstructive pulmonary disease, unspecified: Secondary | ICD-10-CM | POA: Diagnosis not present

## 2017-07-27 DIAGNOSIS — I272 Pulmonary hypertension, unspecified: Secondary | ICD-10-CM | POA: Diagnosis not present

## 2017-07-29 ENCOUNTER — Other Ambulatory Visit: Payer: Self-pay | Admitting: Family Medicine

## 2017-07-29 ENCOUNTER — Telehealth: Payer: Self-pay | Admitting: Family Medicine

## 2017-07-29 NOTE — Telephone Encounter (Signed)
Copied from [place] [phone]. Topic: Quick Communication - Rx Refill/Question >> Jul 29, 2017 12:03 PM Scherrie Gerlach wrote: Medication: labetalol (NORMODYNE) 200 MG tablet Pt states this was a med she was on prior to coming to [place].  Needs new Rx sent to   [facility], [address] AT [address] [phone] (Phone) [phone] (Fax)

## 2017-07-29 NOTE — Telephone Encounter (Signed)
Is she currently taking this? If she hasn't been taking it since seeing me, I don't feel comfortable adding it back in right now as her HRs have been in the 60s-70s and this will drop it further. If she's been taking it regularly, happy to send a refill in

## 2017-07-29 NOTE — Telephone Encounter (Signed)
Spoke with patient and she has been taking this medication. Was written for #180 from her previous doctor and she's now about to run out.

## 2017-07-30 MED ORDER — LABETALOL HCL 200 MG PO TABS: 200.0000 mg | ORAL_TABLET | Freq: Two times a day (BID) | ORAL | 1 refills | Status: DC

## 2017-07-30 NOTE — Telephone Encounter (Signed)
Please find out frequency of dosing, and I'll send in refill.

## 2017-07-30 NOTE — Telephone Encounter (Signed)
Rx sent to her pharmacy 

## 2017-07-30 NOTE — Telephone Encounter (Signed)
Spoke with patient   labetalol (NORMODYNE) 200 MG tablet.  Patient stated bottle's sig says: 1 tablet by two times daily for blood pressure.

## 2017-08-13 DIAGNOSIS — Z9981 Dependence on supplemental oxygen: Secondary | ICD-10-CM | POA: Diagnosis not present

## 2017-08-13 DIAGNOSIS — R0602 Shortness of breath: Secondary | ICD-10-CM | POA: Diagnosis not present

## 2017-08-13 DIAGNOSIS — R600 Localized edema: Secondary | ICD-10-CM | POA: Diagnosis not present

## 2017-08-13 DIAGNOSIS — J449 Chronic obstructive pulmonary disease, unspecified: Secondary | ICD-10-CM | POA: Diagnosis not present

## 2017-08-13 DIAGNOSIS — I272 Pulmonary hypertension, unspecified: Secondary | ICD-10-CM | POA: Diagnosis not present

## 2017-08-13 DIAGNOSIS — G4733 Obstructive sleep apnea (adult) (pediatric): Secondary | ICD-10-CM | POA: Diagnosis not present

## 2017-08-14 ENCOUNTER — Other Ambulatory Visit: Payer: Self-pay | Admitting: Family Medicine

## 2017-08-19 ENCOUNTER — Other Ambulatory Visit: Payer: Self-pay | Admitting: Family Medicine

## 2017-08-19 DIAGNOSIS — Z1231 Encounter for screening mammogram for malignant neoplasm of breast: Secondary | ICD-10-CM

## 2017-08-26 DIAGNOSIS — I272 Pulmonary hypertension, unspecified: Secondary | ICD-10-CM | POA: Diagnosis not present

## 2017-08-26 DIAGNOSIS — G4733 Obstructive sleep apnea (adult) (pediatric): Secondary | ICD-10-CM | POA: Diagnosis not present

## 2017-08-26 DIAGNOSIS — J449 Chronic obstructive pulmonary disease, unspecified: Secondary | ICD-10-CM | POA: Diagnosis not present

## 2017-09-02 ENCOUNTER — Ambulatory Visit: Payer: Medicare HMO | Admitting: Family Medicine

## 2017-09-06 ENCOUNTER — Ambulatory Visit (INDEPENDENT_AMBULATORY_CARE_PROVIDER_SITE_OTHER): Payer: Medicare HMO | Admitting: Unknown Physician Specialty

## 2017-09-06 ENCOUNTER — Encounter: Payer: Self-pay | Admitting: Unknown Physician Specialty

## 2017-09-06 VITALS — BP 129/78 | HR 68 | Wt 319.0 lb

## 2017-09-06 DIAGNOSIS — I270 Primary pulmonary hypertension: Secondary | ICD-10-CM | POA: Diagnosis not present

## 2017-09-06 DIAGNOSIS — I4891 Unspecified atrial fibrillation: Secondary | ICD-10-CM | POA: Diagnosis not present

## 2017-09-06 DIAGNOSIS — E7800 Pure hypercholesterolemia, unspecified: Secondary | ICD-10-CM | POA: Diagnosis not present

## 2017-09-06 LAB — COAGUCHEK XS/INR WAIVED
INR: 2.9 — ABNORMAL HIGH (ref 0.9–1.1)
Prothrombin Time: 34.7 s

## 2017-09-06 NOTE — Assessment & Plan Note (Signed)
O2 depending.  Medication changes allowing ankle swelling to decrease but still DOE.  Will defer to Dr. Raul Del.  Recent notes reviewed and alternative treatment options being reviewed.

## 2017-09-06 NOTE — Assessment & Plan Note (Signed)
On Simvastatin and Zetia.  Tolerating both.

## 2017-09-06 NOTE — Assessment & Plan Note (Signed)
INR 2.9 and at goal.  Recheck next month

## 2017-09-06 NOTE — Progress Notes (Signed)
BP 129/78   Pulse 68   Wt (!) 319 lb (144.7 kg)   SpO2 96%   BMI 53.08 kg/m    Subjective:    Patient ID: Michelle Jenkins, female    DOB: 30-Sep-1945, 71 y.o.   MRN: [id number]  HPI: Michelle Jenkins is a 72 y.o. female  Chief Complaint  Patient presents with  . Follow-up   Reviewed  Previous note on 07/22/17.  INR not quite to goal.  Currently taking Coumadin 7.5 mg alternating 1 and 1.5 daily.  No nasal, vaginal, GI, bleeding.  No blood in urine.  Eating diet consistent with Vitamin K.  No recent antibiotics.    Had some changes in pulmonary hypertension medications (Adempas) due to intolerance.  Dr. Raul Del is aware.    Last visit, concern that she was not taking statin for unknown reason.  Current med list shows Simvastatin plus Zetia   Relevant past medical, surgical, family and social history reviewed and updated as indicated. Interim medical history since our last visit reviewed. Allergies and medications reviewed and updated.  Review of Systems  Constitutional: Positive for fatigue.  HENT: Negative.   Respiratory: Positive for shortness of breath.   Cardiovascular: Negative for chest pain and leg swelling.  Neurological: Negative.   Psychiatric/Behavioral: Negative.     Per HPI unless specifically indicated above     Objective:    BP 129/78   Pulse 68   Wt (!) 319 lb (144.7 kg)   SpO2 96%   BMI 53.08 kg/m   Wt Readings from Last 3 Encounters:  09/06/17 (!) 319 lb (144.7 kg)  07/22/17 (!) 323 lb 4.8 oz (146.6 kg)  07/09/17 (!) 328 lb 8 oz (149 kg)    Physical Exam  Constitutional: She is oriented to person, place, and time. She appears well-developed and well-nourished. No distress.  HENT:  Head: Normocephalic and atraumatic.  Eyes: Conjunctivae and lids are normal. Right eye exhibits no discharge. Left eye exhibits no discharge. No scleral icterus.  Pulmonary/Chest: Effort normal.  Abdominal: Normal appearance. There is no splenomegaly or hepatomegaly.   Musculoskeletal: Normal range of motion.  Neurological: She is alert and oriented to person, place, and time.  Skin: Skin is intact. No rash noted. No pallor.  Psychiatric: She has a normal mood and affect. Her behavior is normal. Judgment and thought content normal.    Results for orders placed or performed in visit on 07/22/17  CoaguChek XS/INR Waived  Result Value Ref Range   INR 1.9 (H) 0.9 - 1.1   Prothrombin Time 22.4 sec  Comprehensive metabolic panel  Result Value Ref Range   Glucose 93 65 - 99 mg/dL   BUN 24 8 - 27 mg/dL   Creatinine, Ser 1.02 (H) 0.57 - 1.00 mg/dL   GFR calc non Af Amer 55 (L) >59 mL/min/1.73   GFR calc Af Amer 63 >59 mL/min/1.73   BUN/Creatinine Ratio 24 12 - 28   Sodium 144 134 - 144 mmol/L   Potassium 4.1 3.5 - 5.2 mmol/L   Chloride 100 96 - 106 mmol/L   CO2 29 20 - 29 mmol/L   Calcium 9.2 8.7 - 10.3 mg/dL   Total Protein 6.6 6.0 - 8.5 g/dL   Albumin 4.2 3.5 - 4.8 g/dL   Globulin, Total 2.4 1.5 - 4.5 g/dL   Albumin/Globulin Ratio 1.8 1.2 - 2.2   Bilirubin Total 0.4 0.0 - 1.2 mg/dL   Alkaline Phosphatase 65 39 - 117 IU/L  AST 20 0 - 40 IU/L   ALT 17 0 - 32 IU/L  Lipid Panel w/o Chol/HDL Ratio  Result Value Ref Range   Cholesterol, Total 183 100 - 199 mg/dL   Triglycerides 45 0 - 149 mg/dL   HDL 75 >39 mg/dL   VLDL Cholesterol Cal 9 5 - 40 mg/dL   LDL Calculated 99 0 - 99 mg/dL      Assessment & Plan:   Problem List Items Addressed This Visit      Unprioritized   Atrial fibrillation ([facility]) - Primary    INR 2.9 and at goal.  Recheck next month      Relevant Orders   CoaguChek XS/INR Waived   Hypercholesteremia    On Simvastatin and Zetia.  Tolerating both.        Primary pulmonary hypertension ([facility])    O2 depending.  Medication changes allowing ankle swelling to decrease but still DOE.  Will defer to Dr. Raul Del.  Recent notes reviewed and alternative treatment options being reviewed.            Follow up plan: Return in  about 1 month (around 10/04/2017).

## 2017-09-10 ENCOUNTER — Other Ambulatory Visit: Payer: Self-pay | Admitting: Family Medicine

## 2017-09-12 ENCOUNTER — Other Ambulatory Visit: Payer: Self-pay | Admitting: Family Medicine

## 2017-09-12 NOTE — Telephone Encounter (Signed)
LOV  09/06/17 Merrie Roof Last refill 08/14/17  # 60  0 refill

## 2017-09-16 ENCOUNTER — Other Ambulatory Visit: Payer: Self-pay | Admitting: Family Medicine

## 2017-09-18 NOTE — Telephone Encounter (Signed)
LOV  09/06/17 Merrie Roof Last refill 06/26/17    # 60 with 2 refills

## 2017-09-23 ENCOUNTER — Ambulatory Visit
Admission: RE | Admit: 2017-09-23 | Discharge: 2017-09-23 | Disposition: A | Discharge status: Home / Self Care | Payer: Medicare HMO | Source: Ambulatory Visit | Attending: Family Medicine | Admitting: Family Medicine

## 2017-09-23 DIAGNOSIS — Z1231 Encounter for screening mammogram for malignant neoplasm of breast: Secondary | ICD-10-CM

## 2017-09-26 DIAGNOSIS — J449 Chronic obstructive pulmonary disease, unspecified: Secondary | ICD-10-CM | POA: Diagnosis not present

## 2017-09-26 DIAGNOSIS — G4733 Obstructive sleep apnea (adult) (pediatric): Secondary | ICD-10-CM | POA: Diagnosis not present

## 2017-09-26 DIAGNOSIS — I272 Pulmonary hypertension, unspecified: Secondary | ICD-10-CM | POA: Diagnosis not present

## 2017-10-10 ENCOUNTER — Ambulatory Visit
Admission: RE | Admit: 2017-10-10 | Discharge: 2017-10-10 | Disposition: A | Discharge status: Home / Self Care | Payer: Medicare HMO | Source: Ambulatory Visit | Attending: Family Medicine | Admitting: Family Medicine

## 2017-10-10 ENCOUNTER — Encounter: Payer: Self-pay | Admitting: Family Medicine

## 2017-10-10 DIAGNOSIS — Z1231 Encounter for screening mammogram for malignant neoplasm of breast: Secondary | ICD-10-CM | POA: Diagnosis not present

## 2017-10-17 ENCOUNTER — Other Ambulatory Visit: Payer: Self-pay | Admitting: Family Medicine

## 2017-10-18 ENCOUNTER — Other Ambulatory Visit: Payer: Self-pay | Admitting: Family Medicine

## 2017-10-18 NOTE — Telephone Encounter (Signed)
warfarin refill Last Refill:07/15/17 # 135 Last OV: 09/06/17 PCP: Merrie Roof PA [place]

## 2017-10-18 NOTE — Telephone Encounter (Signed)
Lasix 20 mg tablet refill request  LOV 09/06/17 with Merrie Roof.   Has upcoming appt with Kathrine Haddock 10/23/17 at 10:30.  Last refill:  10/28/17  #60  0 refills  [facility]  [address], [place]

## 2017-10-23 ENCOUNTER — Other Ambulatory Visit: Payer: Self-pay

## 2017-10-23 ENCOUNTER — Ambulatory Visit (INDEPENDENT_AMBULATORY_CARE_PROVIDER_SITE_OTHER): Payer: Medicare HMO | Admitting: Unknown Physician Specialty

## 2017-10-23 ENCOUNTER — Encounter: Payer: Self-pay | Admitting: Unknown Physician Specialty

## 2017-10-23 VITALS — BP 126/70 | HR 60 | Temp 98.6°F | Wt 325.0 lb

## 2017-10-23 DIAGNOSIS — I4891 Unspecified atrial fibrillation: Secondary | ICD-10-CM | POA: Diagnosis not present

## 2017-10-23 LAB — COAGUCHEK XS/INR WAIVED
INR: 1.7 — ABNORMAL HIGH (ref 0.9–1.1)
PROTHROMBIN TIME: 20.1 s

## 2017-10-23 NOTE — Assessment & Plan Note (Signed)
On Coumadin.  INR was 1.7.  I am reluctant to change her Warfarin as last INR was 2.9.  Discussed improved diet compliance and recheck in 1 week

## 2017-10-23 NOTE — Progress Notes (Signed)
   BP 126/70   Pulse 60   Temp 98.6 F (37 C) (Oral)   Wt (!) 325 lb (147.4 kg)   SpO2 98%   BMI 54.08 kg/m    Subjective:    Patient ID: Michelle Jenkins, female    DOB: November 23, 1945, 72 y.o.   MRN: [id number]  HPI: Michelle Jenkins is a 72 y.o. female  Chief Complaint  Patient presents with  . Atrial Fibrillation    pt alternating 1 tablet and 1.5 tablets   Pt is here for INR recheck  Pt taking 7.5 mg Coumadin, alternating 1 pill with 1 1/2 pills/day.  Denies bleeding and specifically no urinary and GI bleeding.  No nose bleeds or blood in sputum.  No recent antibiotics.  She does admit to maybe too many greens recently  Relevant past medical, surgical, family and social history reviewed and updated as indicated. Interim medical history since our last visit reviewed. Allergies and medications reviewed and updated.  Review of Systems  Per HPI unless specifically indicated above     Objective:    BP 126/70   Pulse 60   Temp 98.6 F (37 C) (Oral)   Wt (!) 325 lb (147.4 kg)   SpO2 98%   BMI 54.08 kg/m   Wt Readings from Last 3 Encounters:  10/23/17 (!) 325 lb (147.4 kg)  09/06/17 (!) 319 lb (144.7 kg)  07/22/17 (!) 323 lb 4.8 oz (146.6 kg)    Physical Exam  Constitutional: She is oriented to person, place, and time. She appears well-developed and well-nourished. No distress.  HENT:  Head: Normocephalic and atraumatic.  Eyes: Conjunctivae and lids are normal. Right eye exhibits no discharge. Left eye exhibits no discharge. No scleral icterus.  Pulmonary/Chest: Effort normal.  Abdominal: Normal appearance. There is no splenomegaly or hepatomegaly.  Musculoskeletal: Normal range of motion.  Neurological: She is alert and oriented to person, place, and time.  Skin: Skin is intact. No rash noted. No pallor.  Psychiatric: She has a normal mood and affect. Her behavior is normal. Judgment and thought content normal.    Results for orders placed or performed in visit on  09/06/17  CoaguChek XS/INR Waived  Result Value Ref Range   INR 2.9 (H) 0.9 - 1.1   Prothrombin Time 34.7 sec      Assessment & Plan:   Problem List Items Addressed This Visit      Unprioritized   Atrial fibrillation ([place]) - Primary    On Coumadin.  INR was 1.7.  I am reluctant to change her Warfarin as last INR was 2.9.  Discussed improved diet compliance and recheck in 1 week      Relevant Orders   CoaguChek XS/INR Waived       Follow up plan: Return in about 1 week (around 10/30/2017) for with Apolonio Schneiders  .

## 2017-10-26 DIAGNOSIS — G4733 Obstructive sleep apnea (adult) (pediatric): Secondary | ICD-10-CM | POA: Diagnosis not present

## 2017-10-26 DIAGNOSIS — J449 Chronic obstructive pulmonary disease, unspecified: Secondary | ICD-10-CM | POA: Diagnosis not present

## 2017-10-26 DIAGNOSIS — I272 Pulmonary hypertension, unspecified: Secondary | ICD-10-CM | POA: Diagnosis not present

## 2017-10-29 ENCOUNTER — Other Ambulatory Visit: Payer: Self-pay | Admitting: Family Medicine

## 2017-10-29 NOTE — Telephone Encounter (Signed)
Benicar HCT 20-12.5 mg refill request  Has appt 10/31/17 with Merrie Roof at 11:00.    Refill during OV in case of changes?  [facility] [phone], [place]

## 2017-10-31 ENCOUNTER — Encounter: Payer: Self-pay | Admitting: Family Medicine

## 2017-10-31 ENCOUNTER — Ambulatory Visit (INDEPENDENT_AMBULATORY_CARE_PROVIDER_SITE_OTHER): Payer: Medicare HMO | Admitting: Family Medicine

## 2017-10-31 ENCOUNTER — Other Ambulatory Visit: Payer: Self-pay

## 2017-10-31 VITALS — BP 149/87 | HR 79 | Temp 98.2°F | Ht 65.0 in | Wt 324.3 lb

## 2017-10-31 DIAGNOSIS — I4891 Unspecified atrial fibrillation: Secondary | ICD-10-CM

## 2017-10-31 LAB — COAGUCHEK XS/INR WAIVED
INR: 2.1 — AB (ref 0.9–1.1)
PROTHROMBIN TIME: 24.6 s

## 2017-10-31 NOTE — Progress Notes (Signed)
   BP (!) 149/87   Pulse 79   Temp 98.2 F (36.8 C) (Oral)   Ht 5\' 5"  (1.651 m)   Wt (!) 324 lb 5 oz (147.1 kg)   SpO2 98%   BMI 53.97 kg/m    Subjective:    Patient ID: Michelle Jenkins, female    DOB: 02-Jul-1945, 72 y.o.   MRN: [id number]  HPI: Michelle Jenkins is a 72 y.o. female  Chief Complaint  Patient presents with  . Follow-up    1 week   Pt here today for 1 week INR f/u. INR last week was 1.7, states her diet was abnormal the week before because her daughter was in town. Consistent with her coumadin, 1 tab and 1.5 tabs alternating. Has been pretty stable on this dose historically. Denies bleeding or bruising issues, new medications.    Relevant past medical, surgical, family and social history reviewed and updated as indicated. Interim medical history since our last visit reviewed. Allergies and medications reviewed and updated.  Review of Systems  Per HPI unless specifically indicated above     Objective:    BP (!) 149/87   Pulse 79   Temp 98.2 F (36.8 C) (Oral)   Ht 5\' 5"  (1.651 m)   Wt (!) 324 lb 5 oz (147.1 kg)   SpO2 98%   BMI 53.97 kg/m   Wt Readings from Last 3 Encounters:  10/31/17 (!) 324 lb 5 oz (147.1 kg)  10/23/17 (!) 325 lb (147.4 kg)  09/06/17 (!) 319 lb (144.7 kg)    Physical Exam  Constitutional: She is oriented to person, place, and time. She appears well-developed and well-nourished. No distress.  HENT:  Head: Atraumatic.  Eyes: Pupils are equal, round, and reactive to light. Conjunctivae are normal.  Neck: Normal range of motion. Neck supple.  Cardiovascular: Normal rate and normal heart sounds.  Pulmonary/Chest: Effort normal.  On continuous O2 via nasal cannula  Musculoskeletal: Normal range of motion.  Neurological: She is alert and oriented to person, place, and time.  Skin: Skin is warm and dry.  Psychiatric: She has a normal mood and affect. Her behavior is normal.  Nursing note and vitals reviewed.   Results for orders  placed or performed in visit on 10/31/17  CoaguChek XS/INR Waived  Result Value Ref Range   INR 2.1 (H) 0.9 - 1.1   Prothrombin Time 24.6 sec      Assessment & Plan:   Problem List Items Addressed This Visit      Cardiovascular and Mediastinum   Atrial fibrillation ([place]) - Primary    Rate well controlled, INR at goal today at 2.1. Continue current regimen      Relevant Orders   CoaguChek XS/INR Waived (Completed)       Follow up plan: Return in about 6 weeks (around 12/12/2017) for INR, CPE .

## 2017-10-31 NOTE — Patient Instructions (Signed)
Shingrix - 2 dose series for shingles

## 2017-11-03 NOTE — Assessment & Plan Note (Signed)
Rate well controlled, INR at goal today at 2.1. Continue current regimen

## 2017-11-19 ENCOUNTER — Telehealth: Payer: Self-pay | Admitting: Family Medicine

## 2017-11-19 NOTE — Telephone Encounter (Signed)
Copied from [place] [id number]. Topic: Quick Communication - See Telephone Encounter >> Nov 19, 2017 11:21 AM Rutherford Nail, NT wrote: CRM for notification. See Telephone encounter for: 11/19/17. Patient calling to check on the status of the letter stating that she is unable to be on jury duty. Please advise. CB#: [phone]

## 2017-11-19 NOTE — Telephone Encounter (Signed)
This is the first I'm hearing of it - does she have a form that needs filled out?

## 2017-11-20 NOTE — Telephone Encounter (Signed)
Called and spoke to patient. The letter that she dropped off was not a letter stating that she was going to have jury duty. It was a letter stating that she had to complete a questionnaire to see if she qualifies to be a juror. I explained to the patient that their is a phone number for her to call on her letter to complete the questionnaire. Patient stated that she could come by to pick the letter up tomorrow. Placed up front in box for pick up.

## 2017-11-21 ENCOUNTER — Other Ambulatory Visit: Payer: Self-pay | Admitting: Family Medicine

## 2017-11-21 NOTE — Telephone Encounter (Signed)
Patient is calling and states that her sister is going to be picking the letter up.

## 2017-11-21 NOTE — Telephone Encounter (Signed)
Patient called and asked is she taking Amitiza because it was documented she is not taking and a refill request from the pharmacy came through. She says "I don't take it."

## 2017-11-26 DIAGNOSIS — I272 Pulmonary hypertension, unspecified: Secondary | ICD-10-CM | POA: Diagnosis not present

## 2017-11-26 DIAGNOSIS — J449 Chronic obstructive pulmonary disease, unspecified: Secondary | ICD-10-CM | POA: Diagnosis not present

## 2017-11-26 DIAGNOSIS — G4733 Obstructive sleep apnea (adult) (pediatric): Secondary | ICD-10-CM | POA: Diagnosis not present

## 2017-12-12 ENCOUNTER — Encounter: Payer: Self-pay | Admitting: Family Medicine

## 2017-12-12 ENCOUNTER — Other Ambulatory Visit: Payer: Self-pay

## 2017-12-12 ENCOUNTER — Ambulatory Visit (INDEPENDENT_AMBULATORY_CARE_PROVIDER_SITE_OTHER): Payer: Medicare HMO | Admitting: Family Medicine

## 2017-12-12 VITALS — BP 130/80 | HR 72 | Temp 98.1°F | Ht 65.0 in | Wt 324.0 lb

## 2017-12-12 DIAGNOSIS — I10 Essential (primary) hypertension: Secondary | ICD-10-CM | POA: Diagnosis not present

## 2017-12-12 DIAGNOSIS — Z0000 Encounter for general adult medical examination without abnormal findings: Secondary | ICD-10-CM

## 2017-12-12 DIAGNOSIS — I4891 Unspecified atrial fibrillation: Secondary | ICD-10-CM | POA: Diagnosis not present

## 2017-12-12 DIAGNOSIS — I2510 Atherosclerotic heart disease of native coronary artery without angina pectoris: Secondary | ICD-10-CM | POA: Diagnosis not present

## 2017-12-12 DIAGNOSIS — E7800 Pure hypercholesterolemia, unspecified: Secondary | ICD-10-CM | POA: Diagnosis not present

## 2017-12-12 DIAGNOSIS — K219 Gastro-esophageal reflux disease without esophagitis: Secondary | ICD-10-CM

## 2017-12-12 DIAGNOSIS — I270 Primary pulmonary hypertension: Secondary | ICD-10-CM | POA: Diagnosis not present

## 2017-12-12 LAB — UA/M W/RFLX CULTURE, ROUTINE
Bilirubin, UA: NEGATIVE
Glucose, UA: NEGATIVE
Ketones, UA: NEGATIVE
NITRITE UA: NEGATIVE
Protein, UA: NEGATIVE
RBC, UA: NEGATIVE
Specific Gravity, UA: 1.015 (ref 1.005–1.030)
UUROB: 0.2 mg/dL (ref 0.2–1.0)
pH, UA: 7 (ref 5.0–7.5)

## 2017-12-12 LAB — COAGUCHEK XS/INR WAIVED
INR: 2.2 — ABNORMAL HIGH (ref 0.9–1.1)
Prothrombin Time: 26.6 s

## 2017-12-12 LAB — MICROSCOPIC EXAMINATION

## 2017-12-12 NOTE — Patient Instructions (Signed)
Schedule follow up with your Pulmonologist

## 2017-12-12 NOTE — Progress Notes (Signed)
BP 130/80   Pulse 72   Temp 98.1 F (36.7 C) (Oral)   Ht 5\' 5"  (1.651 m)   Wt (!) 324 lb (147 kg)   SpO2 98%   BMI 53.92 kg/m    Subjective:    Patient ID: Michelle Jenkins, female    DOB: Jun 26, 1945, 72 y.o.   MRN: [id number]  HPI: SHAMONICA SCHADT is a 72 y.o. female presenting on 12/12/2017 for comprehensive medical examination. Current medical complaints include:see below  Due for INR recheck. Taking 1 tab and 1.5 tabs alternating days with her coumadin, has been stable on this dose for over a year. Denies bleeding or bruising issues.   BPs under good control when checked at home and specialists appts. Compliant with medications, denies CP, SOB, dizziness, HAs, syncope.   Taking zetia and simvastatin for CAD, hyperlipidemia, hx of CVA and MI. Does not adhere to any particular diet. Unable to exercise due to pulmonary dz and need for continuous O2.   Followed by Pulmonology for significant Pulmonary HTN and DOE. On continuous O2, albuterol, and letairis which does help mildly.   She currently lives with: alone Menopausal Symptoms: no  Depression Screen done today and results listed below:  Depression screen [facility] 2/9 12/12/2017 12/13/2016 09/05/2016  Decreased Interest 0 2 0  Down, Depressed, Hopeless 0 0 0  PHQ - 2 Score 0 2 0  Altered sleeping 2 0 -  Tired, decreased energy 2 0 -  Change in appetite 3 0 -  Feeling bad or failure about yourself  3 0 -  Trouble concentrating 0 0 -  Moving slowly or fidgety/restless 0 0 -  Suicidal thoughts 0 0 -  PHQ-9 Score 10 2 -  Difficult doing work/chores - Not difficult at all -    The patient does not have a history of falls. I did not complete a risk assessment for falls. A plan of care for falls was not documented.   Past Medical History:  Past Medical History:  Diagnosis Date  . Anginal pain ([place])   . Asthma   . Bronchitis   . CAD (coronary artery disease)   . CHF (congestive heart failure) ([place])   . CVA (cerebral  infarction)   . Dysrhythmia   . GERD (gastroesophageal reflux disease)   . History of chronic atrial fibrillation   . Hypercholesteremia   . Hypertension   . Idiopathic pulmonary hypertension ([place])   . Myocardial infarction ([place])   . Obesity   . OSA (obstructive sleep apnea)   . Pre-diabetes   . Pulmonary artery hypertension ([place])   . Shortness of breath dyspnea   . Sleep apnea   . Stroke [facility])     Surgical History:  Past Surgical History:  Procedure Laterality Date  . BREAST BIOPSY Right 11/15/2015   stereo, COMPLEX SCLEROSING LESION WITH INTRADUCTAL PAPILLOMA COMPONENT AND   . BREAST EXCISIONAL BIOPSY Right 02/27/2016   NEG  . BREAST LUMPECTOMY WITH NEEDLE LOCALIZATION Right 02/27/2016   Procedure: BREAST LUMPECTOMY WITH NEEDLE LOCALIZATION;  Surgeon: Christene Lye, MD;  Location: [facility] ORS;  Service: General;  Laterality: Right;  . CARDIAC CATHETERIZATION    . CARDIAC CATHETERIZATION Right 12/07/2014   Procedure: Right and Left Heart Cath with Coronary Angiography;  Surgeon: Teodoro Spray, MD;  Location: [facility];  Service: Cardiovascular;  Laterality: Right;  . [place]  and 1999  . COLONOSCOPY  10/31/1998  . DILATION AND CURETTAGE OF  UTERUS    . FOOT SURGERY Right   . HYSTEROSCOPY    . JOINT REPLACEMENT Left   . KNEE ARTHROSCOPY AND ARTHROTOMY Right   . lt knee replacement    . TONSILLECTOMY    . UVULOPALATOPHARYNGOPLASTY      Medications:  Current Outpatient Medications on File Prior to Visit  Medication Sig  . ambrisentan (LETAIRIS) 10 MG tablet Take 10 mg by mouth daily.  Marland Kitchen aspirin 81 MG tablet Take 81 mg by mouth daily.  . diclofenac sodium (VOLTAREN) 1 % GEL Apply 2 g topically 4 (four) times daily.  Marland Kitchen ezetimibe (ZETIA) 10 MG tablet Take 1 tablet (10 mg total) by mouth daily.  . furosemide (LASIX) 20 MG tablet TAKE 1 TABLET(20 MG) BY MOUTH TWICE DAILY AS NEEDED  . labetalol (NORMODYNE) 200 MG tablet Take 1 tablet (200 mg  total) by mouth 2 (two) times daily.  Marland Kitchen olmesartan-hydrochlorothiazide (BENICAR HCT) 20-12.5 MG tablet TAKE 1 TABLET BY MOUTH DAILY  . pantoprazole (PROTONIX) 40 MG tablet Take 1 tablet (40 mg total) by mouth daily.  . simvastatin (ZOCOR) 40 MG tablet Take 40 mg by mouth daily at 6 PM.   . VENTOLIN HFA 108 (90 Base) MCG/ACT inhaler   . warfarin (COUMADIN) 7.5 MG tablet ALTERNATE EVERY OTHER DAY WITH 1 TABLET BY MOUTH AND 1 AND 1/2 TABLETS   No current facility-administered medications on file prior to visit.     Allergies:  Allergies  Allergen Reactions  . Ace Inhibitors Cough  . Nyquil Multi-Symptom [Pseudoeph-Doxylamine-Dm-Apap] Itching  . Tylenol [Acetaminophen] Itching    Social History:  Social History   Socioeconomic History  . Marital status: Single    Spouse name: Not on file  . Number of children: Not on file  . Years of education: Not on file  . Highest education level: Not on file  Occupational History  . Not on file  Social Needs  . Financial resource strain: Not on file  . Food insecurity:    Worry: Not on file    Inability: Not on file  . Transportation needs:    Medical: Not on file    Non-medical: Not on file  Tobacco Use  . Smoking status: Former Smoker    Last attempt to quit: 06/15/2014    Years since quitting: 3.5  . Smokeless tobacco: Never Used  Substance and Sexual Activity  . Alcohol use: Yes    Alcohol/week: 0.0 - 1.0 standard drinks    Comment: occasionally  . Drug use: No  . Sexual activity: Not on file  Lifestyle  . Physical activity:    Days per week: Not on file    Minutes per session: Not on file  . Stress: Not on file  Relationships  . Social connections:    Talks on phone: Not on file    Gets together: Not on file    Attends religious service: Not on file    Active member of club or organization: Not on file    Attends meetings of clubs or organizations: Not on file    Relationship status: Not on file  . Intimate partner  violence:    Fear of current or ex partner: Not on file    Emotionally abused: Not on file    Physically abused: Not on file    Forced sexual activity: Not on file  Other Topics Concern  . Not on file  Social History Narrative  . Not on file   Social History  Tobacco Use  Smoking Status Former Smoker  . Last attempt to quit: 06/15/2014  . Years since quitting: 3.5  Smokeless Tobacco Never Used   Social History   Substance and Sexual Activity  Alcohol Use Yes  . Alcohol/week: 0.0 - 1.0 standard drinks   Comment: occasionally    Family History:  Family History  Problem Relation Age of Onset  . Diabetes Mother   . Alzheimer's disease Mother   . Throat cancer Brother   . Diabetes Brother   . Cancer Brother        lung  . Hypertension Brother   . Cancer Daughter 77       metastatic uterine PECOMA to the liver and lungs  . Rheumatic fever Daughter   . Diabetes Sister   . Hypertension Sister   . COPD Neg Hx   . Heart disease Neg Hx   . Stroke Neg Hx   . Breast cancer Neg Hx     Past medical history, surgical history, medications, allergies, family history and social history reviewed with patient today and changes made to appropriate areas of the chart.   Review of Systems - General ROS: negative Psychological ROS: negative Ophthalmic ROS: negative ENT ROS: negative Allergy and Immunology ROS: negative Hematological and Lymphatic ROS: negative Endocrine ROS: negative Breast ROS: negative for breast lumps Respiratory ROS: positive for - shortness of breath Cardiovascular ROS: no chest pain or dyspnea on exertion Gastrointestinal ROS: no abdominal pain, change in bowel habits, or black or bloody stools Genito-Urinary ROS: no dysuria, trouble voiding, or hematuria Musculoskeletal ROS: negative Neurological ROS: no TIA or stroke symptoms Dermatological ROS: negative All other ROS negative except what is listed above and in the HPI.      Objective:    BP 130/80    Pulse 72   Temp 98.1 F (36.7 C) (Oral)   Ht 5\' 5"  (1.651 m)   Wt (!) 324 lb (147 kg)   SpO2 98%   BMI 53.92 kg/m   Wt Readings from Last 3 Encounters:  12/12/17 (!) 324 lb (147 kg)  10/31/17 (!) 324 lb 5 oz (147.1 kg)  10/23/17 (!) 325 lb (147.4 kg)    Physical Exam  Constitutional: She is oriented to person, place, and time. She appears well-developed and well-nourished. No distress.  HENT:  Head: Atraumatic.  Right Ear: External ear normal.  Left Ear: External ear normal.  Nose: Nose normal.  Mouth/Throat: Oropharynx is clear and moist. No oropharyngeal exudate.  Eyes: Pupils are equal, round, and reactive to light. Conjunctivae and EOM are normal. No scleral icterus.  Neck: Normal range of motion. Neck supple. No thyromegaly present.  Cardiovascular: Normal rate, regular rhythm, normal heart sounds and intact distal pulses.  Pulmonary/Chest: Effort normal and breath sounds normal. No respiratory distress.  On continuous O2 via nasal cannula Declines breast exam today  Abdominal: Soft. Bowel sounds are normal. She exhibits no mass. There is no tenderness.  Genitourinary:  Genitourinary Comments: Declines pelvic exam  Musculoskeletal: Normal range of motion. She exhibits no edema or tenderness.  Lymphadenopathy:    She has no cervical adenopathy.  Neurological: She is alert and oriented to person, place, and time. No cranial nerve deficit.  Skin: Skin is warm and dry. No rash noted.  Psychiatric: She has a normal mood and affect. Her behavior is normal.  Nursing note and vitals reviewed.   Results for orders placed or performed in visit on 12/12/17  Microscopic Examination  Result Value Ref  Range   WBC, UA 0-5 0 - 5 /hpf   RBC, UA 0-2 0 - 2 /hpf   Epithelial Cells (non renal) 0-10 0 - 10 /hpf   Bacteria, UA Few None seen/Few  CoaguChek XS/INR Waived  Result Value Ref Range   INR 2.2 (H) 0.9 - 1.1   Prothrombin Time 26.6 sec  UA/M w/rflx Culture, Routine  Result  Value Ref Range   Specific Gravity, UA 1.015 1.005 - 1.030   pH, UA 7.0 5.0 - 7.5   Color, UA Yellow Yellow   Appearance Ur Hazy (A) Clear   Leukocytes, UA Trace (A) Negative   Protein, UA Negative Negative/Trace   Glucose, UA Negative Negative   Ketones, UA Negative Negative   RBC, UA Negative Negative   Bilirubin, UA Negative Negative   Urobilinogen, Ur 0.2 0.2 - 1.0 mg/dL   Nitrite, UA Negative Negative   Microscopic Examination See below:   CBC with Differential/Platelet  Result Value Ref Range   WBC 4.8 3.4 - 10.8 x10E3/uL   RBC 3.35 (L) 3.77 - 5.28 x10E6/uL   Hemoglobin 10.4 (L) 11.1 - 15.9 g/dL   Hematocrit 32.5 (L) 34.0 - 46.6 %   MCV 97 79 - 97 fL   MCH 31.0 26.6 - 33.0 pg   MCHC 32.0 31.5 - 35.7 g/dL   RDW 13.7 12.3 - 15.4 %   Platelets 186 150 - 450 x10E3/uL   Neutrophils 52 Not Estab. %   Lymphs 31 Not Estab. %   Monocytes 12 Not Estab. %   Eos 4 Not Estab. %   Basos 1 Not Estab. %   Neutrophils Absolute 2.6 1.4 - 7.0 x10E3/uL   Lymphocytes Absolute 1.5 0.7 - 3.1 x10E3/uL   Monocytes Absolute 0.6 0.1 - 0.9 x10E3/uL   EOS (ABSOLUTE) 0.2 0.0 - 0.4 x10E3/uL   Basophils Absolute 0.0 0.0 - 0.2 x10E3/uL   Immature Granulocytes 0 Not Estab. %   Immature Grans (Abs) 0.0 0.0 - 0.1 x10E3/uL  Comprehensive metabolic panel  Result Value Ref Range   Glucose 101 (H) 65 - 99 mg/dL   BUN 21 8 - 27 mg/dL   Creatinine, Ser 1.03 (H) 0.57 - 1.00 mg/dL   GFR calc non Af Amer 54 (L) >59 mL/min/1.73   GFR calc Af Amer 62 >59 mL/min/1.73   BUN/Creatinine Ratio 20 12 - 28   Sodium 143 134 - 144 mmol/L   Potassium 4.4 3.5 - 5.2 mmol/L   Chloride 100 96 - 106 mmol/L   CO2 30 (H) 20 - 29 mmol/L   Calcium 9.3 8.7 - 10.3 mg/dL   Total Protein 6.9 6.0 - 8.5 g/dL   Albumin 4.4 3.5 - 4.8 g/dL   Globulin, Total 2.5 1.5 - 4.5 g/dL   Albumin/Globulin Ratio 1.8 1.2 - 2.2   Bilirubin Total 0.6 0.0 - 1.2 mg/dL   Alkaline Phosphatase 68 39 - 117 IU/L   AST 21 0 - 40 IU/L   ALT 15 0 - 32  IU/L  Lipid Panel w/o Chol/HDL Ratio  Result Value Ref Range   Cholesterol, Total 153 100 - 199 mg/dL   Triglycerides 56 0 - 149 mg/dL   HDL 82 >39 mg/dL   VLDL Cholesterol Cal 11 5 - 40 mg/dL   LDL Calculated 60 0 - 99 mg/dL      Assessment & Plan:   Problem List Items Addressed This Visit      Cardiovascular and Mediastinum   Primary pulmonary hypertension ([place])  Followed by Pulmonology. Continue current regimen      CAD in native artery    Recheck lipids, adjust as needed      Relevant Orders   Lipid Panel w/o Chol/HDL Ratio (Completed)   Hypertension    Stable and WNL, continue current regimen as well as working on [employer]      Relevant Orders   UA/M w/rflx Culture, Routine (Completed)   CBC with Differential/Platelet (Completed)   Comprehensive metabolic panel (Completed)   Atrial fibrillation ([place]) - Primary    INR at goal at 2.2 today. Continue current regimen      Relevant Orders   CoaguChek XS/INR Waived (Completed)     Digestive   GERD (gastroesophageal reflux disease)    Stable on prn protonix. Continue  Current regimen        Other   Hypercholesteremia    Recheck lipids, work on dietary modifications. Adjust as needed      Relevant Orders   Lipid Panel w/o Chol/HDL Ratio (Completed)    Other Visit Diagnoses    Annual physical exam           Follow up plan: Return in about 6 weeks (around 01/23/2018) for INR.   LABORATORY TESTING:  - Pap smear: not applicable  IMMUNIZATIONS:   - Tdap: Tetanus vaccination status reviewed: refused. - Influenza: postponed, will get at next INR check in 6 weeks - Pneumovax: Up to date - Prevnar: Up to date - HPV: Not applicable - Zostavax vaccine: Refused  SCREENING: -Mammogram: Up to date  - Colonoscopy: Up to date  - Bone Density: Up to date   PATIENT COUNSELING:   Advised to take 1 mg of folate supplement per day if capable of pregnancy.   Sexuality: Discussed sexually transmitted  diseases, partner selection, use of condoms, avoidance of unintended pregnancy  and contraceptive alternatives.   Advised to avoid cigarette smoking.  I discussed with the patient that most people either abstain from alcohol or drink within safe limits (<=14/week and <=4 drinks/occasion for males, <=7/weeks and <= 3 drinks/occasion for females) and that the risk for alcohol disorders and other health effects rises proportionally with the number of drinks per week and how often a drinker exceeds daily limits.  Discussed cessation/primary prevention of drug use and availability of treatment for abuse.   Diet: Encouraged to adjust caloric intake to maintain  or achieve ideal body weight, to reduce intake of dietary saturated fat and total fat, to limit sodium intake by avoiding high sodium foods and not adding table salt, and to maintain adequate dietary potassium and calcium preferably from fresh fruits, vegetables, and low-fat dairy products.    stressed the importance of regular exercise  Injury prevention: Discussed safety belts, safety helmets, smoke detector, smoking near bedding or upholstery.   Dental health: Discussed importance of regular tooth brushing, flossing, and dental visits.    NEXT PREVENTATIVE PHYSICAL DUE IN 1 YEAR. Return in about 6 weeks (around 01/23/2018) for INR.

## 2017-12-13 ENCOUNTER — Encounter: Payer: Self-pay | Admitting: Family Medicine

## 2017-12-13 LAB — COMPREHENSIVE METABOLIC PANEL
ALBUMIN: 4.4 g/dL (ref 3.5–4.8)
ALT: 15 IU/L (ref 0–32)
AST: 21 IU/L (ref 0–40)
Albumin/Globulin Ratio: 1.8 (ref 1.2–2.2)
Alkaline Phosphatase: 68 IU/L (ref 39–117)
BUN / CREAT RATIO: 20 (ref 12–28)
BUN: 21 mg/dL (ref 8–27)
Bilirubin Total: 0.6 mg/dL (ref 0.0–1.2)
CO2: 30 mmol/L — AB (ref 20–29)
CREATININE: 1.03 mg/dL — AB (ref 0.57–1.00)
Calcium: 9.3 mg/dL (ref 8.7–10.3)
Chloride: 100 mmol/L (ref 96–106)
GFR calc Af Amer: 62 mL/min/{1.73_m2} (ref >59)
GFR calc non Af Amer: 54 mL/min/{1.73_m2} — ABNORMAL LOW (ref >59)
GLUCOSE: 101 mg/dL — AB (ref 65–99)
Globulin, Total: 2.5 g/dL (ref 1.5–4.5)
Potassium: 4.4 mmol/L (ref 3.5–5.2)
Sodium: 143 mmol/L (ref 134–144)
Total Protein: 6.9 g/dL (ref 6.0–8.5)

## 2017-12-13 LAB — CBC WITH DIFFERENTIAL/PLATELET
Basophils Absolute: 0 10*3/uL (ref 0.0–0.2)
Basos: 1 %
EOS (ABSOLUTE): 0.2 10*3/uL (ref 0.0–0.4)
Eos: 4 %
HEMOGLOBIN: 10.4 g/dL — AB (ref 11.1–15.9)
Hematocrit: 32.5 % — ABNORMAL LOW (ref 34.0–46.6)
IMMATURE GRANS (ABS): 0 10*3/uL (ref 0.0–0.1)
Immature Granulocytes: 0 %
LYMPHS: 31 %
Lymphocytes Absolute: 1.5 10*3/uL (ref 0.7–3.1)
MCH: 31 pg (ref 26.6–33.0)
MCHC: 32 g/dL (ref 31.5–35.7)
MCV: 97 fL (ref 79–97)
MONOCYTES: 12 %
Monocytes Absolute: 0.6 10*3/uL (ref 0.1–0.9)
NEUTROS ABS: 2.6 10*3/uL (ref 1.4–7.0)
Neutrophils: 52 %
Platelets: 186 10*3/uL (ref 150–450)
RBC: 3.35 x10E6/uL — AB (ref 3.77–5.28)
RDW: 13.7 % (ref 12.3–15.4)
WBC: 4.8 10*3/uL (ref 3.4–10.8)

## 2017-12-13 LAB — LIPID PANEL W/O CHOL/HDL RATIO
Cholesterol, Total: 153 mg/dL (ref 100–199)
HDL: 82 mg/dL (ref >39)
LDL Calculated: 60 mg/dL (ref 0–99)
Triglycerides: 56 mg/dL (ref 0–149)
VLDL Cholesterol Cal: 11 mg/dL (ref 5–40)

## 2017-12-16 NOTE — Assessment & Plan Note (Signed)
Stable and WNL, continue current regimen as well as working on [employer]

## 2017-12-16 NOTE — Assessment & Plan Note (Signed)
Recheck lipids, adjust as needed 

## 2017-12-16 NOTE — Assessment & Plan Note (Signed)
Stable on prn protonix. Continue  Current regimen

## 2017-12-16 NOTE — Assessment & Plan Note (Signed)
Recheck lipids, work on dietary modifications. Adjust as needed

## 2017-12-16 NOTE — Assessment & Plan Note (Signed)
Followed by Pulmonology. Continue current regimen

## 2017-12-16 NOTE — Assessment & Plan Note (Signed)
INR at goal at 2.2 today. Continue current regimen

## 2017-12-17 DIAGNOSIS — G4733 Obstructive sleep apnea (adult) (pediatric): Secondary | ICD-10-CM | POA: Diagnosis not present

## 2017-12-17 DIAGNOSIS — R0602 Shortness of breath: Secondary | ICD-10-CM | POA: Diagnosis not present

## 2017-12-17 DIAGNOSIS — I2721 Secondary pulmonary arterial hypertension: Secondary | ICD-10-CM | POA: Diagnosis not present

## 2017-12-17 DIAGNOSIS — Z9981 Dependence on supplemental oxygen: Secondary | ICD-10-CM | POA: Diagnosis not present

## 2017-12-17 DIAGNOSIS — Z9989 Dependence on other enabling machines and devices: Secondary | ICD-10-CM | POA: Diagnosis not present

## 2017-12-18 ENCOUNTER — Ambulatory Visit: Payer: Medicare HMO

## 2017-12-27 DIAGNOSIS — I272 Pulmonary hypertension, unspecified: Secondary | ICD-10-CM | POA: Diagnosis not present

## 2017-12-27 DIAGNOSIS — G4733 Obstructive sleep apnea (adult) (pediatric): Secondary | ICD-10-CM | POA: Diagnosis not present

## 2017-12-27 DIAGNOSIS — J449 Chronic obstructive pulmonary disease, unspecified: Secondary | ICD-10-CM | POA: Diagnosis not present

## 2017-12-31 DIAGNOSIS — R69 Illness, unspecified: Secondary | ICD-10-CM | POA: Diagnosis not present

## 2018-01-03 ENCOUNTER — Other Ambulatory Visit: Payer: Self-pay | Admitting: Family Medicine

## 2018-01-09 DIAGNOSIS — I2721 Secondary pulmonary arterial hypertension: Secondary | ICD-10-CM | POA: Diagnosis not present

## 2018-01-13 ENCOUNTER — Other Ambulatory Visit: Payer: Self-pay | Admitting: Family Medicine

## 2018-01-13 NOTE — Telephone Encounter (Signed)
Atorvastatin 20 mg tab  refill Last Refill:? # ? Last OV: 12/12/17  NOV  01/16/18 PCP: Merrie Roof [facility] # [phone]  Pt's preference was to stop taking this medication at the 10/23/17 office visit.

## 2018-01-14 ENCOUNTER — Other Ambulatory Visit: Payer: Self-pay | Admitting: Family Medicine

## 2018-01-15 NOTE — Telephone Encounter (Signed)
Requested Prescriptions  Pending Prescriptions Disp Refills   warfarin (COUMADIN) 7.5 MG tablet [Pharmacy Med Name: WARFARIN SOD 7.5MG  TABLETS (YELLOW)] 135 tablet 0    Sig: ALTERNATE EVERY OTHER DAY WITH 1 TABLET BY MOUTH AND 1 AND 1/2 TABLETS     Hematology:  Anticoagulants - warfarin Failed - 01/14/2018  5:15 PM      Failed - If the patient is managed by [facility] - route to their Pool. If not, forward to the provider.      Failed - INR in normal range and within 30 days    INR  Date Value Ref Range Status  12/12/2017 2.2 (H) 0.9 - 1.1 Final         Passed - Valid encounter within last 3 months    Recent Outpatient Visits          1 month ago Atrial fibrillation, unspecified type [facility])   [facility] Volney American, [place]   2 months ago Atrial fibrillation, unspecified type [facility])   [facility] Volney American, [place]   2 months ago Atrial fibrillation, unspecified type ([place])   [place] Kathrine Haddock, NP   4 months ago Atrial fibrillation, unspecified type ([place])   [place] Kathrine Haddock, NP   5 months ago Atrial fibrillation, unspecified type [facility])   [facility], Lilia Argue, [place]      Future Appointments            Tomorrow  [facility], [place], Lilia Argue, PA-C [place], [facility]          furosemide (LASIX) 20 MG tablet [Pharmacy Med Name: FUROSEMIDE 20MG  TABLETS] 180 tablet 0    Sig: TAKE 1 TABLET(20 MG) BY MOUTH TWICE DAILY AS NEEDED     Cardiovascular:  Diuretics - Loop Failed - 01/14/2018  5:15 PM      Failed - Cr in normal range and within 360 days    Creatinine, Ser  Date Value Ref Range Status  12/12/2017 1.03 (H) 0.57 - 1.00 mg/dL Final         Passed - K in normal range and within 360 days    Potassium  Date Value Ref Range Status  12/12/2017 4.4 3.5 - 5.2 mmol/L Final         Passed - Ca in normal range and  within 360 days    Calcium  Date Value Ref Range Status  12/12/2017 9.3 8.7 - 10.3 mg/dL Final         Passed - Na in normal range and within 360 days    Sodium  Date Value Ref Range Status  12/12/2017 143 134 - 144 mmol/L Final         Passed - Last BP in normal range    BP Readings from Last 1 Encounters:  12/12/17 130/80         Passed - Valid encounter within last 6 months    Recent Outpatient Visits          1 month ago Atrial fibrillation, unspecified type [facility])   [facility] Volney American, PA-C   2 months ago Atrial fibrillation, unspecified type [facility])   [facility] Volney American, PA-C   2 months ago Atrial fibrillation, unspecified type [facility])   [place], NP   4 months ago Atrial fibrillation, unspecified type [facility])   [facility] Kathrine Haddock, NP  5 months ago Atrial fibrillation, unspecified type [facility])   [place], Lilia Argue, [place]      Future Appointments            [place], [place], Lilia Argue, PA-C [facility], [place]

## 2018-01-16 ENCOUNTER — Ambulatory Visit (INDEPENDENT_AMBULATORY_CARE_PROVIDER_SITE_OTHER): Payer: Medicare HMO

## 2018-01-16 ENCOUNTER — Other Ambulatory Visit: Payer: Self-pay

## 2018-01-16 ENCOUNTER — Encounter: Payer: Self-pay | Admitting: Family Medicine

## 2018-01-16 ENCOUNTER — Other Ambulatory Visit: Payer: Self-pay | Admitting: Family Medicine

## 2018-01-16 ENCOUNTER — Ambulatory Visit (INDEPENDENT_AMBULATORY_CARE_PROVIDER_SITE_OTHER): Payer: Medicare HMO | Admitting: Family Medicine

## 2018-01-16 VITALS — BP 130/72 | HR 64 | Temp 97.4°F | Resp 16 | Ht 65.0 in | Wt 321.3 lb

## 2018-01-16 VITALS — BP 130/72 | HR 64 | Temp 97.4°F | Resp 16 | Ht 65.0 in | Wt 321.2 lb

## 2018-01-16 DIAGNOSIS — I4891 Unspecified atrial fibrillation: Secondary | ICD-10-CM | POA: Diagnosis not present

## 2018-01-16 DIAGNOSIS — Z0000 Encounter for general adult medical examination without abnormal findings: Secondary | ICD-10-CM | POA: Diagnosis not present

## 2018-01-16 DIAGNOSIS — Z23 Encounter for immunization: Secondary | ICD-10-CM | POA: Diagnosis not present

## 2018-01-16 LAB — COAGUCHEK XS/INR WAIVED
INR: 3.3 — ABNORMAL HIGH (ref 0.9–1.1)
PROTHROMBIN TIME: 39.1 s

## 2018-01-16 NOTE — Telephone Encounter (Signed)
Patient has appointment today

## 2018-01-16 NOTE — Patient Instructions (Signed)
Michelle Jenkins , Thank you for taking time to come for your Medicare Wellness Visit. I appreciate your ongoing commitment to your health goals. Please review the following plan we discussed and let me know if I can assist you in the future.   Screening recommendations/referrals: Colonoscopy: cologuard completed 05/16/2015 Mammogram: completed 10/10/2017 Bone Density: completed 01/13/2015 Recommended yearly ophthalmology/optometry visit for glaucoma screening and checkup Recommended yearly dental visit for hygiene and checkup  Vaccinations: Influenza vaccine: up to date Pneumococcal vaccine: completed series today  Tdap vaccine: due, check with your insurance company for coverage information  Shingles vaccine: completed shingrix series    Advanced directives: Advance directive discussed with you today. Even though you declined this today please call our office should you change your mind and we can give you the proper paperwork for you to fill out.  Conditions/risks identified: recommend drinking at least 6-8 glasses of water a day   Next appointment: Follow up in one year for your annual wellness exam.    Preventive Care 65 Years and Older, Female Preventive care refers to lifestyle choices and visits with your health care provider that can promote health and wellness. What does preventive care include?  A yearly physical exam. This is also called an annual well check.  Dental exams once or twice a year.  Routine eye exams. Ask your health care provider how often you should have your eyes checked.  Personal lifestyle choices, including:  Daily care of your teeth and gums.  Regular physical activity.  Eating a healthy diet.  Avoiding tobacco and drug use.  Limiting alcohol use.  Practicing safe sex.  Taking low-dose aspirin every day.  Taking vitamin and mineral supplements as recommended by your health care provider. What happens during an annual well check? The services  and screenings done by your health care provider during your annual well check will depend on your age, overall health, lifestyle risk factors, and family history of disease. Counseling  Your health care provider may ask you questions about your:  Alcohol use.  Tobacco use.  Drug use.  Emotional well-being.  Home and relationship well-being.  Sexual activity.  Eating habits.  History of falls.  Memory and ability to understand (cognition).  Work and work Statistician.  Reproductive health. Screening  You may have the following tests or measurements:  Height, weight, and BMI.  Blood pressure.  Lipid and cholesterol levels. These may be checked every 5 years, or more frequently if you are over 24 years old.  Skin check.  Lung cancer screening. You may have this screening every year starting at age 45 if you have a 30-pack-year history of smoking and currently smoke or have quit within the past 15 years.  Fecal occult blood test (FOBT) of the stool. You may have this test every year starting at age 37.  Flexible sigmoidoscopy or colonoscopy. You may have a sigmoidoscopy every 5 years or a colonoscopy every 10 years starting at age 33.  Hepatitis C blood test.  Hepatitis B blood test.  Sexually transmitted disease (STD) testing.  Diabetes screening. This is done by checking your blood sugar (glucose) after you have not eaten for a while (fasting). You may have this done every 1-3 years.  Bone density scan. This is done to screen for osteoporosis. You may have this done starting at age 42.  Mammogram. This may be done every 1-2 years. Talk to your health care provider about how often you should have regular mammograms. Talk with  your health care provider about your test results, treatment options, and if necessary, the need for more tests. Vaccines  Your health care provider may recommend certain vaccines, such as:  Influenza vaccine. This is recommended every  year.  Tetanus, diphtheria, and acellular pertussis (Tdap, Td) vaccine. You may need a Td booster every 10 years.  Zoster vaccine. You may need this after age 4.  Pneumococcal 13-valent conjugate (PCV13) vaccine. One dose is recommended after age 94.  Pneumococcal polysaccharide (PPSV23) vaccine. One dose is recommended after age 1. Talk to your health care provider about which screenings and vaccines you need and how often you need them. This information is not intended to replace advice given to you by your health care provider. Make sure you discuss any questions you have with your health care provider. Document Released: 04/29/2015 Document Revised: 12/21/2015 Document Reviewed: 02/01/2015 Elsevier Interactive Patient Education  2017 [place] Prevention in the Home Falls can cause injuries. They can happen to people of all ages. There are many things you can do to make your home safe and to help prevent falls. What can I do on the outside of my home?  Regularly fix the edges of walkways and driveways and fix any cracks.  Remove anything that might make you trip as you walk through a door, such as a raised step or threshold.  Trim any bushes or trees on the path to your home.  Use bright outdoor lighting.  Clear any walking paths of anything that might make someone trip, such as rocks or tools.  Regularly check to see if handrails are loose or broken. Make sure that both sides of any steps have handrails.  Any raised decks and porches should have guardrails on the edges.  Have any leaves, snow, or ice cleared regularly.  Use sand or salt on walking paths during winter.  Clean up any spills in your garage right away. This includes oil or grease spills. What can I do in the bathroom?  Use night lights.  Install grab bars by the toilet and in the tub and shower. Do not use towel bars as grab bars.  Use non-skid mats or decals in the tub or shower.  If you  need to sit down in the shower, use a plastic, non-slip stool.  Keep the floor dry. Clean up any water that spills on the floor as soon as it happens.  Remove soap buildup in the tub or shower regularly.  Attach bath mats securely with double-sided non-slip rug tape.  Do not have throw rugs and other things on the floor that can make you trip. What can I do in the bedroom?  Use night lights.  Make sure that you have a light by your bed that is easy to reach.  Do not use any sheets or blankets that are too big for your bed. They should not hang down onto the floor.  Have a firm chair that has side arms. You can use this for support while you get dressed.  Do not have throw rugs and other things on the floor that can make you trip. What can I do in the kitchen?  Clean up any spills right away.  Avoid walking on wet floors.  Keep items that you use a lot in easy-to-reach places.  If you need to reach something above you, use a strong step stool that has a grab bar.  Keep electrical cords out of the way.  Do not  use floor polish or wax that makes floors slippery. If you must use wax, use non-skid floor wax.  Do not have throw rugs and other things on the floor that can make you trip. What can I do with my stairs?  Do not leave any items on the stairs.  Make sure that there are handrails on both sides of the stairs and use them. Fix handrails that are broken or loose. Make sure that handrails are as long as the stairways.  Check any carpeting to make sure that it is firmly attached to the stairs. Fix any carpet that is loose or worn.  Avoid having throw rugs at the top or bottom of the stairs. If you do have throw rugs, attach them to the floor with carpet tape.  Make sure that you have a light switch at the top of the stairs and the bottom of the stairs. If you do not have them, ask someone to add them for you. What else can I do to help prevent falls?  Wear shoes  that:  Do not have high heels.  Have rubber bottoms.  Are comfortable and fit you well.  Are closed at the toe. Do not wear sandals.  If you use a stepladder:  Make sure that it is fully opened. Do not climb a closed stepladder.  Make sure that both sides of the stepladder are locked into place.  Ask someone to hold it for you, if possible.  Clearly mark and make sure that you can see:  Any grab bars or handrails.  First and last steps.  Where the edge of each step is.  Use tools that help you move around (mobility aids) if they are needed. These include:  Canes.  Walkers.  Scooters.  Crutches.  Turn on the lights when you go into a dark area. Replace any light bulbs as soon as they burn out.  Set up your furniture so you have a clear path. Avoid moving your furniture around.  If any of your floors are uneven, fix them.  If there are any pets around you, be aware of where they are.  Review your medicines with your doctor. Some medicines can make you feel dizzy. This can increase your chance of falling. Ask your doctor what other things that you can do to help prevent falls. This information is not intended to replace advice given to you by your health care provider. Make sure you discuss any questions you have with your health care provider. Document Released: 01/27/2009 Document Revised: 09/08/2015 Document Reviewed: 05/07/2014 Elsevier Interactive Patient Education  2017 [employer].

## 2018-01-16 NOTE — Progress Notes (Signed)
Subjective:   Michelle Jenkins is a 72 y.o. female who presents for Medicare Annual (Subsequent) preventive examination.  Review of Systems:  Cardiac Risk Factors include: advanced age (>9men, >96 women);dyslipidemia;hypertension;obesity (BMI >30kg/m2)     Objective:     Vitals: BP 130/72 (BP Location: Left Arm, Patient Position: Sitting)   Pulse 64   Temp (!) 97.4 F (36.3 C) (Temporal)   Resp 16   Ht 5\' 5"  (1.651 m)   Wt (!) 321 lb 4.8 oz (145.7 kg)   SpO2 95%   BMI 53.47 kg/m   Body mass index is 53.47 kg/m.  Advanced Directives 01/16/2018 12/13/2016 02/20/2016 12/07/2014  Does Patient Have a Medical Advance Directive? No No No No  Would patient like information on creating a medical advance directive? No - Patient declined Yes (MAU/Ambulatory/Procedural Areas - Information given) No - patient declined information No - patient declined information    Tobacco Social History   Tobacco Use  Smoking Status Former Smoker  . Packs/day: 0.25  . Types: Cigarettes  . Last attempt to quit: 06/15/2014  . Years since quitting: 3.5  Smokeless Tobacco Never Used  Tobacco Comment   4-5 cigarettes a day when she smoked     Counseling given: No Comment: 4-5 cigarettes a day when she smoked   Clinical Intake:  Pre-visit preparation completed: Yes  Pain : No/denies pain     Nutritional Status: BMI > 30  Obese Nutritional Risks: None Diabetes: No  How often do you need to have someone help you when you read instructions, pamphlets, or other written materials from your doctor or pharmacy?: 2 - Rarely What is the last grade level you completed in school?: 8th grade  Interpreter Needed?: No  Information entered by :: Tiffany Hill,LPN   Past Medical History:  Diagnosis Date  . Anginal pain ([place])   . Asthma   . Bronchitis   . CAD (coronary artery disease)   . CHF (congestive heart failure) ([place])   . CVA (cerebral infarction)   . Dysrhythmia   . GERD (gastroesophageal  reflux disease)   . History of chronic atrial fibrillation   . Hypercholesteremia   . Hypertension   . Idiopathic pulmonary hypertension ([place])   . Myocardial infarction ([place])   . Obesity   . OSA (obstructive sleep apnea)   . Pre-diabetes   . Pulmonary artery hypertension ([place])   . Shortness of breath dyspnea   . Sleep apnea   . Stroke [facility])    Past Surgical History:  Procedure Laterality Date  . BREAST BIOPSY Right 11/15/2015   stereo, COMPLEX SCLEROSING LESION WITH INTRADUCTAL PAPILLOMA COMPONENT AND   . BREAST EXCISIONAL BIOPSY Right 02/27/2016   NEG  . BREAST LUMPECTOMY WITH NEEDLE LOCALIZATION Right 02/27/2016   Procedure: BREAST LUMPECTOMY WITH NEEDLE LOCALIZATION;  Surgeon: Christene Lye, MD;  Location: [facility] ORS;  Service: General;  Laterality: Right;  . CARDIAC CATHETERIZATION    . CARDIAC CATHETERIZATION Right 12/07/2014   Procedure: Right and Left Heart Cath with Coronary Angiography;  Surgeon: Teodoro Spray, MD;  Location: [facility];  Service: Cardiovascular;  Laterality: Right;  . [place]  and 1999  . COLONOSCOPY  10/31/1998  . DILATION AND CURETTAGE OF UTERUS    . FOOT SURGERY Right   . HYSTEROSCOPY    . JOINT REPLACEMENT Left   . KNEE ARTHROSCOPY AND ARTHROTOMY Right   . lt knee replacement    . TONSILLECTOMY    .  UVULOPALATOPHARYNGOPLASTY     Family History  Problem Relation Age of Onset  . Diabetes Mother   . Alzheimer's disease Mother   . Throat cancer Brother   . Diabetes Brother   . Cancer Brother        lung  . Hypertension Brother   . Cancer Daughter 23       metastatic uterine PECOMA to the liver and lungs  . Rheumatic fever Daughter   . Diabetes Sister   . Hypertension Sister   . COPD Neg Hx   . Heart disease Neg Hx   . Stroke Neg Hx   . Breast cancer Neg Hx    Social History   Socioeconomic History  . Marital status: Single    Spouse name: Not on file  . Number of children: Not on file  . Years  of education: Not on file  . Highest education level: 8th grade  Occupational History  . Not on file  Social Needs  . Financial resource strain: Not hard at all  . Food insecurity:    Worry: Never true    Inability: Never true  . Transportation needs:    Medical: No    Non-medical: No  Tobacco Use  . Smoking status: Former Smoker    Packs/day: 0.25    Types: Cigarettes    Last attempt to quit: 06/15/2014    Years since quitting: 3.5  . Smokeless tobacco: Never Used  . Tobacco comment: 4-5 cigarettes a day when she smoked  Substance and Sexual Activity  . Alcohol use: Yes    Alcohol/week: 0.0 - 1.0 standard drinks    Comment: occasionally  . Drug use: No  . Sexual activity: Not on file  Lifestyle  . Physical activity:    Days per week: 0 days    Minutes per session: 0 min  . Stress: Not at all  Relationships  . Social connections:    Talks on phone: More than three times a week    Gets together: More than three times a week    Attends religious service: More than 4 times per year    Active member of club or organization: No    Attends meetings of clubs or organizations: Never    Relationship status: Widowed  Other Topics Concern  . Not on file  Social History Narrative  . Not on file    Outpatient Encounter Medications as of 01/16/2018  Medication Sig  . ambrisentan (LETAIRIS) 10 MG tablet Take 10 mg by mouth daily.  Marland Kitchen aspirin 81 MG tablet Take 81 mg by mouth daily.  Marland Kitchen atorvastatin (LIPITOR) 20 MG tablet TAKE 1 TABLET(20 MG) BY MOUTH DAILY  . ezetimibe (ZETIA) 10 MG tablet TAKE 1 TABLET(10 MG) BY MOUTH DAILY  . furosemide (LASIX) 20 MG tablet TAKE 1 TABLET(20 MG) BY MOUTH TWICE DAILY AS NEEDED  . labetalol (NORMODYNE) 200 MG tablet TAKE 1 TABLET(200 MG) BY MOUTH TWICE DAILY  . olmesartan-hydrochlorothiazide (BENICAR HCT) 20-12.5 MG tablet TAKE 1 TABLET BY MOUTH DAILY  . pantoprazole (PROTONIX) 40 MG tablet Take 1 tablet (40 mg total) by mouth daily.  . pantoprazole  (PROTONIX) 40 MG tablet TAKE 1 TABLET(40 MG) BY MOUTH DAILY  . simvastatin (ZOCOR) 40 MG tablet Take 40 mg by mouth daily at 6 PM.   . VENTOLIN HFA 108 (90 Base) MCG/ACT inhaler   . warfarin (COUMADIN) 7.5 MG tablet ALTERNATE EVERY OTHER DAY WITH 1 TABLET BY MOUTH AND 1 AND 1/2 TABLETS  .  diclofenac sodium (VOLTAREN) 1 % GEL Apply 2 g topically 4 (four) times daily. (Patient not taking: Reported on 01/16/2018)  . [DISCONTINUED] labetalol (NORMODYNE) 200 MG tablet Take 1 tablet (200 mg total) by mouth 2 (two) times daily.   No facility-administered encounter medications on file as of 01/16/2018.     Activities of Daily Living In your present state of health, do you have any difficulty performing the following activities: 01/16/2018  Hearing? N  Comment declines hearing aids   Vision? Y  Comment wears glasses  Difficulty concentrating or making decisions? N  Walking or climbing stairs? Y  Comment knee pain and dyspnea   Dressing or bathing? N  Doing errands, shopping? N  Preparing Food and eating ? N  Using the Toilet? N  In the past six months, have you accidently leaked urine? N  Do you have problems with loss of bowel control? N  Managing your Medications? N  Managing your Finances? N  Housekeeping or managing your Housekeeping? N  Some recent data might be hidden    Patient Care Team: Volney American, PA-C as PCP - General (Family Medicine) Albina Billet, MD (Internal Medicine) Christene Lye, MD (General Surgery)    Assessment:   This is a routine wellness examination for Carleen.  Exercise Activities and Dietary recommendations Current Exercise Habits: The patient does not participate in regular exercise at present, Exercise limited by: None identified  Goals    . DIET - INCREASE WATER INTAKE     Recommend drinking at least 6-8 glasses of water a day        Fall Risk Fall Risk  01/16/2018 09/06/2017 03/20/2017 12/13/2016 09/05/2016  Falls in the past year?  No No No No No   FALL RISK PREVENTION PERTAINING TO THE HOME:  Any stairs in or around the home WITH handrails? No  stairs  Home free of loose throw rugs in walkways, pet beds, electrical cords, etc? Yes  Adequate lighting in your home to reduce risk of falls? Yes   ASSISTIVE DEVICES UTILIZED TO PREVENT FALLS:  Life alert? Yes  Use of a cane, walker or w/c? Yes  uses walker at night  Grab bars in the bathroom? Yes  Shower chair or bench in shower? No  Elevated toilet seat or a handicapped toilet? yes  DME ORDERS:  DME order needed?  No   TIMED UP AND GO:  Was the test performed? Yes .  Length of time to ambulate 10 feet: 11 sec.   GAIT:  Appearance of gait: Gait slow, steady without the use of an assistive device.  Education: Fall risk prevention has been discussed. Has oxygen machine  Intervention(s) required? No    Depression Screen PHQ 2/9 Scores 01/16/2018 12/12/2017 12/13/2016 09/05/2016  PHQ - 2 Score 4 0 2 0  PHQ- 9 Score 10 10 2  -     Cognitive Function     6CIT Screen 01/16/2018 12/13/2016  What Year? 0 points 0 points  What month? 0 points 0 points  What time? 0 points 0 points  Count back from 20 0 points 0 points  Months in reverse 0 points 0 points  Repeat phrase 2 points 4 points  Total Score 2 4    Immunization History  Administered Date(s) Administered  . Influenza, High Dose Seasonal PF 12/31/2017  . Pneumococcal Conjugate-13 01/16/2018  . Pneumococcal Polysaccharide-23 12/13/2016  . Td 05/11/1999  . Zoster Recombinat (Shingrix) 11/01/2017, 12/31/2017    Qualifies for Shingles  Vaccine?Yes  Shingrix series completed 11/01/2017, 12/31/2017  Tetanus: Although this vaccine is not a covered service during a Wellness Exam, does the patient still wish to receive this vaccine today?  No .  Education has been provided regarding the importance of this vaccine. Advised may receive this vaccine at local pharmacy or Health Dept. Aware to provide a copy of  the vaccination record if obtained from local pharmacy or Health Dept. Verbalized acceptance and understanding.  Flu Vaccine:completed   Pneumococcal Vaccine: Due for Pneumococcal vaccine. Does the patient want to receive this vaccine today?  Yes .  Patient was unsure if she ever received prevnar 13 and requested it be done today. She was informed there were no records found in our system or [place], [place]. Patient was educated on potential charges for this vaccine if she has ever received it before paid by medicare. Patient verbalized understanding and still would like vaccine today. Pt received vaccine today.   Screening Tests Health Maintenance  Topic Date Due  . INFLUENZA VACCINE  11/14/2017  . PNA vac Low Risk Adult (2 of 2 - PCV13) 12/13/2017  . TETANUS/TDAP  01/17/2019 (Originally 05/10/2009)  . Fecal DNA (Cologuard)  05/15/2018  . MAMMOGRAM  10/11/2019  . DEXA SCAN  Completed  . Hepatitis C Screening  Completed    Cancer Screenings:  Colorectal Screening: Cologuard completed 05/16/2015. Repeat every 3 years; due 05/15/2018  Mammogram: Completed 10/10/2017. Repeat every year/2 years  Bone Density: Completed 01/13/2015.   Lung Cancer Screening: (Low Dose CT Chest recommended if Age 38-80 years, 30 pack-year currently smoking OR have quit w/in 15years.) does not qualify.    Additional Screening:  Hepatitis C Screening: does qualify; Completed 12/13/2016  Vision Screening: Recommended annual ophthalmology exams for early detection of glaucoma and other disorders of the eye. Is the patient up to date with their annual eye exam?  Yes  Who is the provider or what is the name of the office in which the pt attends annual eye exams? Goes to [place]: Recommended annual dental exams for proper oral hygiene  Community Resource Referral:  CRR required this visit?  No      Plan:    I have personally reviewed and addressed the Medicare Annual Wellness  questionnaire and have noted the following in the patient's chart:  A. Medical and social history B. Use of alcohol, tobacco or illicit drugs  C. Current medications and supplements D. Functional ability and status E.  Nutritional status F.  Physical activity G. Advance directives /H. List of other physicians I.  Hospitalizations, surgeries, and ER visits in previous 12 months J.  [place] such as hearing and vision if needed, cognitive and depression L. Referrals and appointments   In addition, I have reviewed and discussed with patient certain preventive protocols, quality metrics, and best practice recommendations. A written personalized care plan for preventive services as well as general preventive health recommendations were provided to patient.   Signed,  Tyler Aas, LPN Nurse Health Advisor   Nurse Notes:none

## 2018-01-16 NOTE — Patient Instructions (Signed)
Follow-up in 6 weeks

## 2018-01-16 NOTE — Assessment & Plan Note (Signed)
INR just above goal today at 3.3. Reviewed stable dietary intake of vitamin K. Continue current dose as she's typically stable on this.

## 2018-01-16 NOTE — Progress Notes (Signed)
BP 130/72   Pulse 64   Temp (!) 97.4 F (36.3 C) (Oral)   Resp 16   Ht 5\' 5"  (1.651 m)   Wt (!) 321 lb 3 oz (145.7 kg)   SpO2 95%   BMI 53.45 kg/m    Subjective:    Patient ID: Michelle Jenkins, female    DOB: 04/19/1945, 72 y.o.   MRN: [id number]  HPI: Michelle Jenkins is a 72 y.o. female  Chief Complaint  Patient presents with  . Coagulation Disorder   Here today for INR check for atrial fibrillation. Taking 1 tablet and 1.5 tablets alternating days on her 7.5 mg coumadin. No new medications, no bleeding or bruising issues. INRs tend to fluctuate some regardless of stable dosing for over a year given inconsistent diet. Denies CP, SOB, palpitations. On continuous O2.   Relevant past medical, surgical, family and social history reviewed and updated as indicated. Interim medical history since our last visit reviewed. Allergies and medications reviewed and updated.  Review of Systems  Per HPI unless specifically indicated above     Objective:    BP 130/72   Pulse 64   Temp (!) 97.4 F (36.3 C) (Oral)   Resp 16   Ht 5\' 5"  (1.651 m)   Wt (!) 321 lb 3 oz (145.7 kg)   SpO2 95%   BMI 53.45 kg/m   Wt Readings from Last 3 Encounters:  01/16/18 (!) 321 lb 3 oz (145.7 kg)  01/16/18 (!) 321 lb 4.8 oz (145.7 kg)  12/12/17 (!) 324 lb (147 kg)    Physical Exam  Constitutional:  obese  HENT:  Head: Atraumatic.  Eyes: Conjunctivae and EOM are normal.  Neck: Normal range of motion. Neck supple.  Cardiovascular: Normal rate and normal heart sounds.  Pulmonary/Chest: Effort normal. No respiratory distress.  On continuous nasal cannula  Musculoskeletal: She exhibits edema (chronic, unchanged).  ROM at baseline  Neurological: She is alert.  Skin: Skin is warm and dry.  Psychiatric: She has a normal mood and affect. Her behavior is normal.  Nursing note and vitals reviewed.   Results for orders placed or performed in visit on 12/12/17  Microscopic Examination  Result  Value Ref Range   WBC, UA 0-5 0 - 5 /hpf   RBC, UA 0-2 0 - 2 /hpf   Epithelial Cells (non renal) 0-10 0 - 10 /hpf   Bacteria, UA Few None seen/Few  CoaguChek XS/INR Waived  Result Value Ref Range   INR 2.2 (H) 0.9 - 1.1   Prothrombin Time 26.6 sec  UA/M w/rflx Culture, Routine  Result Value Ref Range   Specific Gravity, UA 1.015 1.005 - 1.030   pH, UA 7.0 5.0 - 7.5   Color, UA Yellow Yellow   Appearance Ur Hazy (A) Clear   Leukocytes, UA Trace (A) Negative   Protein, UA Negative Negative/Trace   Glucose, UA Negative Negative   Ketones, UA Negative Negative   RBC, UA Negative Negative   Bilirubin, UA Negative Negative   Urobilinogen, Ur 0.2 0.2 - 1.0 mg/dL   Nitrite, UA Negative Negative   Microscopic Examination See below:   CBC with Differential/Platelet  Result Value Ref Range   WBC 4.8 3.4 - 10.8 x10E3/uL   RBC 3.35 (L) 3.77 - 5.28 x10E6/uL   Hemoglobin 10.4 (L) 11.1 - 15.9 g/dL   Hematocrit 32.5 (L) 34.0 - 46.6 %   MCV 97 79 - 97 fL   MCH 31.0 26.6 - 33.0  pg   MCHC 32.0 31.5 - 35.7 g/dL   RDW 13.7 12.3 - 15.4 %   Platelets 186 150 - 450 x10E3/uL   Neutrophils 52 Not Estab. %   Lymphs 31 Not Estab. %   Monocytes 12 Not Estab. %   Eos 4 Not Estab. %   Basos 1 Not Estab. %   Neutrophils Absolute 2.6 1.4 - 7.0 x10E3/uL   Lymphocytes Absolute 1.5 0.7 - 3.1 x10E3/uL   Monocytes Absolute 0.6 0.1 - 0.9 x10E3/uL   EOS (ABSOLUTE) 0.2 0.0 - 0.4 x10E3/uL   Basophils Absolute 0.0 0.0 - 0.2 x10E3/uL   Immature Granulocytes 0 Not Estab. %   Immature Grans (Abs) 0.0 0.0 - 0.1 x10E3/uL  Comprehensive metabolic panel  Result Value Ref Range   Glucose 101 (H) 65 - 99 mg/dL   BUN 21 8 - 27 mg/dL   Creatinine, Ser 1.03 (H) 0.57 - 1.00 mg/dL   GFR calc non Af Amer 54 (L) >59 mL/min/1.73   GFR calc Af Amer 62 >59 mL/min/1.73   BUN/Creatinine Ratio 20 12 - 28   Sodium 143 134 - 144 mmol/L   Potassium 4.4 3.5 - 5.2 mmol/L   Chloride 100 96 - 106 mmol/L   CO2 30 (H) 20 - 29 mmol/L    Calcium 9.3 8.7 - 10.3 mg/dL   Total Protein 6.9 6.0 - 8.5 g/dL   Albumin 4.4 3.5 - 4.8 g/dL   Globulin, Total 2.5 1.5 - 4.5 g/dL   Albumin/Globulin Ratio 1.8 1.2 - 2.2   Bilirubin Total 0.6 0.0 - 1.2 mg/dL   Alkaline Phosphatase 68 39 - 117 IU/L   AST 21 0 - 40 IU/L   ALT 15 0 - 32 IU/L  Lipid Panel w/o Chol/HDL Ratio  Result Value Ref Range   Cholesterol, Total 153 100 - 199 mg/dL   Triglycerides 56 0 - 149 mg/dL   HDL 82 >39 mg/dL   VLDL Cholesterol Cal 11 5 - 40 mg/dL   LDL Calculated 60 0 - 99 mg/dL      Assessment & Plan:   Problem List Items Addressed This Visit      Cardiovascular and Mediastinum   Atrial fibrillation ([place]) - Primary    INR just above goal today at 3.3. Reviewed stable dietary intake of vitamin K. Continue current dose as she's typically stable on this.       Relevant Orders   CoaguChek XS/INR Waived       Follow up plan: Return in about 6 weeks (around 02/27/2018) for INR.

## 2018-01-16 NOTE — Addendum Note (Signed)
Addended by: Tyler Aas A on: 01/16/2018 10:27 AM   Modules accepted: Miquel Dunn

## 2018-01-23 ENCOUNTER — Telehealth: Payer: Self-pay | Admitting: Family Medicine

## 2018-01-23 NOTE — Telephone Encounter (Signed)
Copied from [place] [id number]. Topic: Quick Communication - See Telephone Encounter >> Jan 23, 2018 10:42 AM Bea Graff, NT wrote: CRM for notification. See Telephone encounter for: 01/23/18. Pt requesting pain medication to be called in for her back. [facility] [place], [place] [facility] [address] Shari Prows [phone] (Phone) [phone] (Fax)

## 2018-01-23 NOTE — Telephone Encounter (Signed)
Pt is allergic to tylenol. Message was relayed to patient and she did verbalized understanding.

## 2018-01-23 NOTE — Telephone Encounter (Signed)
She can take tylenol as needed and use the voltaren gel or lidocaine patches

## 2018-01-26 ENCOUNTER — Other Ambulatory Visit: Payer: Self-pay | Admitting: Family Medicine

## 2018-01-26 DIAGNOSIS — G4733 Obstructive sleep apnea (adult) (pediatric): Secondary | ICD-10-CM | POA: Diagnosis not present

## 2018-01-26 DIAGNOSIS — I272 Pulmonary hypertension, unspecified: Secondary | ICD-10-CM | POA: Diagnosis not present

## 2018-01-26 DIAGNOSIS — J449 Chronic obstructive pulmonary disease, unspecified: Secondary | ICD-10-CM | POA: Diagnosis not present

## 2018-02-17 ENCOUNTER — Other Ambulatory Visit: Payer: Self-pay | Admitting: Family Medicine

## 2018-02-18 NOTE — Telephone Encounter (Signed)
Requested medication (s) are due for refill today:   Yes  Requested medication (s) are on the active medication list:   Yes  Future visit scheduled:   Yes 02/27/18 with Merrie Roof   Last ordered: 07/22/17  #90 1 refill  Been given a 30 day courtesy refill already.   Requested Prescriptions  Pending Prescriptions Disp Refills   pantoprazole (PROTONIX) 40 MG tablet [Pharmacy Med Name: PANTOPRAZOLE 40MG  TABLETS] 30 tablet 0    Sig: TAKE 1 TABLET(40 MG) BY MOUTH DAILY     Gastroenterology: Proton Pump Inhibitors Passed - 02/17/2018 10:16 AM      Passed - Valid encounter within last 12 months    Recent Outpatient Visits          1 month ago Atrial fibrillation, unspecified type [facility])   [facility] Volney American, [place]   2 months ago Atrial fibrillation, unspecified type [facility])   [facility] Volney American, [place]   3 months ago Atrial fibrillation, unspecified type [facility])   [facility] Volney American, [place]   3 months ago Atrial fibrillation, unspecified type [facility])   [place], NP   5 months ago Atrial fibrillation, unspecified type [facility])   [facility] Kathrine Haddock, NP      Future Appointments            In 1 week Orene Desanctis, Lilia Argue, [place], [facility]

## 2018-02-21 DIAGNOSIS — G4733 Obstructive sleep apnea (adult) (pediatric): Secondary | ICD-10-CM | POA: Diagnosis not present

## 2018-02-21 DIAGNOSIS — I2721 Secondary pulmonary arterial hypertension: Secondary | ICD-10-CM | POA: Diagnosis not present

## 2018-02-21 DIAGNOSIS — I270 Primary pulmonary hypertension: Secondary | ICD-10-CM | POA: Diagnosis not present

## 2018-02-26 DIAGNOSIS — G4733 Obstructive sleep apnea (adult) (pediatric): Secondary | ICD-10-CM | POA: Diagnosis not present

## 2018-02-26 DIAGNOSIS — I272 Pulmonary hypertension, unspecified: Secondary | ICD-10-CM | POA: Diagnosis not present

## 2018-02-26 DIAGNOSIS — J449 Chronic obstructive pulmonary disease, unspecified: Secondary | ICD-10-CM | POA: Diagnosis not present

## 2018-02-27 ENCOUNTER — Encounter: Payer: Self-pay | Admitting: Family Medicine

## 2018-02-27 ENCOUNTER — Other Ambulatory Visit: Payer: Self-pay

## 2018-02-27 ENCOUNTER — Ambulatory Visit (INDEPENDENT_AMBULATORY_CARE_PROVIDER_SITE_OTHER): Payer: Medicare HMO | Admitting: Family Medicine

## 2018-02-27 VITALS — BP 137/77 | HR 83 | Temp 98.5°F | Ht 65.0 in | Wt 321.0 lb

## 2018-02-27 DIAGNOSIS — I4891 Unspecified atrial fibrillation: Secondary | ICD-10-CM | POA: Diagnosis not present

## 2018-02-27 DIAGNOSIS — M545 Low back pain, unspecified: Secondary | ICD-10-CM

## 2018-02-27 DIAGNOSIS — G8929 Other chronic pain: Secondary | ICD-10-CM

## 2018-02-27 LAB — COAGUCHEK XS/INR WAIVED
INR: 2.9 — AB (ref 0.9–1.1)
Prothrombin Time: 35.1 s

## 2018-02-27 MED ORDER — WARFARIN SODIUM 7.5 MG PO TABS: ORAL_TABLET | ORAL | 3 refills | Status: DC

## 2018-02-27 NOTE — Patient Instructions (Signed)
Follow-up in 6 weeks

## 2018-02-27 NOTE — Progress Notes (Signed)
BP 137/77   Pulse 83   Temp 98.5 F (36.9 C) (Oral)   Ht 5\' 5"  (1.651 m)   Wt (!) 321 lb (145.6 kg)   SpO2 97%   BMI 53.42 kg/m    Subjective:    Patient ID: Michelle Jenkins, female    DOB: 1945-05-10, 72 y.o.   MRN: [id number]  HPI: Michelle Jenkins is a 72 y.o. female  Chief Complaint  Patient presents with  . Coagulation Disorder    f/u  . Back Pain   Here today for INR f/u. On 7.5 mg coumadin alternating 1 tab and 1.5 tabs every other day for her atrial fibrillation. Has been stable on this dose for well over a year. Denies bleeding or bruising issues, new medications, CP, palpitations. Diet stable. Compliant with dosing.   Also having some back aches after sitting for long periods of time. This has been ongoing. No known injury, radiation of pain, numbness or tingling. Not trying anything OTC.   Past Medical History:  Diagnosis Date  . Anginal pain ([place])   . Asthma   . Bronchitis   . CAD (coronary artery disease)   . CHF (congestive heart failure) ([place])   . CVA (cerebral infarction)   . Dysrhythmia   . GERD (gastroesophageal reflux disease)   . History of chronic atrial fibrillation   . Hypercholesteremia   . Hypertension   . Idiopathic pulmonary hypertension ([place])   . Myocardial infarction ([place])   . Obesity   . OSA (obstructive sleep apnea)   . Pre-diabetes   . Pulmonary artery hypertension ([place])   . Shortness of breath dyspnea   . Sleep apnea   . Stroke [facility])    Social History   Socioeconomic History  . Marital status: Single    Spouse name: Not on file  . Number of children: Not on file  . Years of education: Not on file  . Highest education level: 8th grade  Occupational History  . Not on file  Social Needs  . Financial resource strain: Not hard at all  . Food insecurity:    Worry: Never true    Inability: Never true  . Transportation needs:    Medical: No    Non-medical: No  Tobacco Use  . Smoking status: Former Smoker    Packs/day:  0.25    Types: Cigarettes    Last attempt to quit: 06/15/2014    Years since quitting: 3.7  . Smokeless tobacco: Never Used  . Tobacco comment: 4-5 cigarettes a day when she smoked  Substance and Sexual Activity  . Alcohol use: Yes    Alcohol/week: 0.0 - 1.0 standard drinks    Comment: occasionally  . Drug use: No  . Sexual activity: Not on file  Lifestyle  . Physical activity:    Days per week: 0 days    Minutes per session: 0 min  . Stress: Not at all  Relationships  . Social connections:    Talks on phone: More than three times a week    Gets together: More than three times a week    Attends religious service: More than 4 times per year    Active member of club or organization: No    Attends meetings of clubs or organizations: Never    Relationship status: Widowed  . Intimate partner violence:    Fear of current or ex partner: No    Emotionally abused: No    Physically abused: No  Forced sexual activity: No  Other Topics Concern  . Not on file  Social History Narrative  . Not on file    Relevant past medical, surgical, family and social history reviewed and updated as indicated. Interim medical history since our last visit reviewed. Allergies and medications reviewed and updated.  Review of Systems  Per HPI unless specifically indicated above     Objective:    BP 137/77   Pulse 83   Temp 98.5 F (36.9 C) (Oral)   Ht 5\' 5"  (1.651 m)   Wt (!) 321 lb (145.6 kg)   SpO2 97%   BMI 53.42 kg/m   Wt Readings from Last 3 Encounters:  02/27/18 (!) 321 lb (145.6 kg)  01/16/18 (!) 321 lb 3 oz (145.7 kg)  01/16/18 (!) 321 lb 4.8 oz (145.7 kg)    Physical Exam  Constitutional: She is oriented to person, place, and time. She appears well-developed and well-nourished. No distress.  HENT:  Head: Atraumatic.  Eyes: Conjunctivae and EOM are normal.  Neck: Normal range of motion. Neck supple.  Cardiovascular: Normal rate and intact distal pulses.  Pulmonary/Chest:  Effort normal and breath sounds normal.  Musculoskeletal: Normal range of motion. She exhibits tenderness (mild paraspinal muscle ttp b/l lumbar region). She exhibits no edema or deformity.  Neurological: She is alert and oriented to person, place, and time.  Skin: Skin is warm and dry.  Psychiatric: She has a normal mood and affect. Her behavior is normal.  Nursing note and vitals reviewed.   Results for orders placed or performed in visit on 01/16/18  CoaguChek XS/INR Waived (STAT)  Result Value Ref Range   INR 3.3 (H) 0.9 - 1.1   Prothrombin Time 39.1 sec      Assessment & Plan:   Problem List Items Addressed This Visit      Cardiovascular and Mediastinum   Atrial fibrillation ([place]) - Primary    INR stable today at 2.9. Continue current regimen. Rate well controlled on labetalol      Relevant Medications   warfarin (COUMADIN) 7.5 MG tablet   Other Relevant Orders   CoaguChek XS/INR Waived    Other Visit Diagnoses    Chronic bilateral low back pain without sciatica       Exercises given, discussed stretches, increasing activity, weight loss, heating pads, lidocaine patches. F/u if worsening or not improving       Follow up plan: Return in about 6 weeks (around 04/10/2018) for INR.

## 2018-02-27 NOTE — Assessment & Plan Note (Signed)
INR stable today at 2.9. Continue current regimen. Rate well controlled on labetalol

## 2018-03-20 DIAGNOSIS — J449 Chronic obstructive pulmonary disease, unspecified: Secondary | ICD-10-CM | POA: Diagnosis not present

## 2018-03-20 DIAGNOSIS — I272 Pulmonary hypertension, unspecified: Secondary | ICD-10-CM | POA: Diagnosis not present

## 2018-03-20 DIAGNOSIS — G4733 Obstructive sleep apnea (adult) (pediatric): Secondary | ICD-10-CM | POA: Diagnosis not present

## 2018-03-28 DIAGNOSIS — J449 Chronic obstructive pulmonary disease, unspecified: Secondary | ICD-10-CM | POA: Diagnosis not present

## 2018-03-28 DIAGNOSIS — G4733 Obstructive sleep apnea (adult) (pediatric): Secondary | ICD-10-CM | POA: Diagnosis not present

## 2018-03-28 DIAGNOSIS — I272 Pulmonary hypertension, unspecified: Secondary | ICD-10-CM | POA: Diagnosis not present

## 2018-03-30 ENCOUNTER — Other Ambulatory Visit: Payer: Self-pay | Admitting: Family Medicine

## 2018-04-03 ENCOUNTER — Other Ambulatory Visit: Payer: Self-pay

## 2018-04-03 ENCOUNTER — Encounter: Payer: Self-pay | Admitting: Family Medicine

## 2018-04-03 ENCOUNTER — Ambulatory Visit (INDEPENDENT_AMBULATORY_CARE_PROVIDER_SITE_OTHER): Payer: Medicare HMO | Admitting: Family Medicine

## 2018-04-03 VITALS — BP 120/78 | HR 84 | Temp 98.3°F | Ht 65.0 in

## 2018-04-03 DIAGNOSIS — I4891 Unspecified atrial fibrillation: Secondary | ICD-10-CM | POA: Diagnosis not present

## 2018-04-03 LAB — COAGUCHEK XS/INR WAIVED
INR: 1.8 — AB (ref 0.9–1.1)
Prothrombin Time: 21.4 s

## 2018-04-03 NOTE — Progress Notes (Signed)
   BP 120/78   Pulse 84   Temp 98.3 F (36.8 C) (Oral)   Ht 5\' 5"  (1.651 m)   SpO2 100%   BMI 53.42 kg/m    Subjective:    Patient ID: Michelle Jenkins, female    DOB: 1945/09/26, 72 y.o.   MRN: [id number]  HPI: Michelle Jenkins is a 72 y.o. female  Chief Complaint  Patient presents with  . Atrial Fibrillation    f/u   Here today for INR follow up for atrial fibrillation. Currently alternating 1 tab and 1.5 tabs of her 7.5 mg coumadin. No bleeding or bruising issues, recent medications changes, diet changes, palpitations, CP. Last INR was at goal at 2.9.   Relevant past medical, surgical, family and social history reviewed and updated as indicated. Interim medical history since our last visit reviewed. Allergies and medications reviewed and updated.  Review of Systems  Per HPI unless specifically indicated above     Objective:    BP 120/78   Pulse 84   Temp 98.3 F (36.8 C) (Oral)   Ht 5\' 5"  (1.651 m)   SpO2 100%   BMI 53.42 kg/m   Wt Readings from Last 3 Encounters:  02/27/18 (!) 321 lb (145.6 kg)  01/16/18 (!) 321 lb 3 oz (145.7 kg)  01/16/18 (!) 321 lb 4.8 oz (145.7 kg)    Physical Exam Vitals signs and nursing note reviewed.  Constitutional:      Appearance: Normal appearance. She is not ill-appearing.  HENT:     Head: Atraumatic.  Eyes:     Extraocular Movements: Extraocular movements intact.     Conjunctiva/sclera: Conjunctivae normal.  Neck:     Musculoskeletal: Normal range of motion and neck supple.  Cardiovascular:     Rate and Rhythm: Normal rate.  Pulmonary:     Effort: Pulmonary effort is normal.     Breath sounds: Normal breath sounds.  Musculoskeletal: Normal range of motion.  Skin:    General: Skin is warm and dry.  Neurological:     Mental Status: She is alert and oriented to person, place, and time.  Psychiatric:        Mood and Affect: Mood normal.        Thought Content: Thought content normal.        Judgment: Judgment normal.      Results for orders placed or performed in visit on 04/03/18  CoaguChek XS/INR Waived  Result Value Ref Range   INR 1.8 (H) 0.9 - 1.1   Prothrombin Time 21.4 sec      Assessment & Plan:   Problem List Items Addressed This Visit      Cardiovascular and Mediastinum   Atrial fibrillation ([place]) - Primary    INR subtherapeutic today at 1.8, but historically her INRs have been subtly variable. Continue current dose, watch diet closely, and recheck in 2 weeks      Relevant Orders   CoaguChek XS/INR Waived (Completed)       Follow up plan: Return in about 2 weeks (around 04/17/2018) for INR.

## 2018-04-06 NOTE — Assessment & Plan Note (Signed)
INR subtherapeutic today at 1.8, but historically her INRs have been subtly variable. Continue current dose, watch diet closely, and recheck in 2 weeks

## 2018-04-11 DIAGNOSIS — H2513 Age-related nuclear cataract, bilateral: Secondary | ICD-10-CM | POA: Diagnosis not present

## 2018-04-17 ENCOUNTER — Encounter: Payer: Self-pay | Admitting: Family Medicine

## 2018-04-17 ENCOUNTER — Ambulatory Visit (INDEPENDENT_AMBULATORY_CARE_PROVIDER_SITE_OTHER): Payer: Medicare HMO | Admitting: Family Medicine

## 2018-04-17 ENCOUNTER — Other Ambulatory Visit: Payer: Self-pay | Admitting: Family Medicine

## 2018-04-17 ENCOUNTER — Telehealth: Payer: Self-pay | Admitting: Family Medicine

## 2018-04-17 VITALS — BP 136/91 | HR 72 | Temp 98.9°F | Wt 316.0 lb

## 2018-04-17 DIAGNOSIS — I4891 Unspecified atrial fibrillation: Secondary | ICD-10-CM

## 2018-04-17 DIAGNOSIS — G629 Polyneuropathy, unspecified: Secondary | ICD-10-CM | POA: Diagnosis not present

## 2018-04-17 DIAGNOSIS — R110 Nausea: Secondary | ICD-10-CM

## 2018-04-17 LAB — COAGUCHEK XS/INR WAIVED
INR: 1.7 — ABNORMAL HIGH (ref 0.9–1.1)
Prothrombin Time: 20.2 s

## 2018-04-17 MED ORDER — GABAPENTIN 300 MG PO CAPS: 300.0000 mg | ORAL_CAPSULE | Freq: Every day | ORAL | 0 refills | Status: DC

## 2018-04-17 MED ORDER — SUCRALFATE 1 G PO TABS: 1.0000 g | ORAL_TABLET | Freq: Three times a day (TID) | ORAL | 1 refills | Status: DC

## 2018-04-17 NOTE — Patient Instructions (Signed)
Take 1.5 tablets of your warfarin 5 days a week and take 1 tablet of your warfarin the other 2 days per week.    So on Tuesday and Thursday, take 1 tablet of your warfarin. Monday, Wednesday, Friday, Saturday, and Sunday take 1 and a half tablets.   Recheck in 2 weeks. If you have any bleeding or bruising issues in the meantime call right away.

## 2018-04-17 NOTE — Telephone Encounter (Signed)
Returned pt's call and discussed spacing out any alcohol with her gabapentin as they can both be very sedating. She verbalizes understanding and states she rarely drinks and does not drink to excess but just wanted to be sure she could have a drink during the day and still take her medicine.   Copied from [place] [id number]. Topic: General - Other >> Apr 17, 2018 12:33 PM Keene Breath wrote: Reason for CRM: Patient called to request that the nurse or doctor give her a call about her medication, gabapentin (NEURONTIN) 300 MG capsule.  Patient had a question of whether she could drink alcohol while taking this medication.  Please advise and call back at [phone]

## 2018-04-17 NOTE — Progress Notes (Signed)
BP (!) 136/91   Pulse 72   Temp 98.9 F (37.2 C) (Oral)   Wt (!) 316 lb (143.3 kg)   SpO2 95%   BMI 52.59 kg/m    Subjective:    Patient ID: Michelle Jenkins, female    DOB: 1945-12-25, 73 y.o.   MRN: [id number]  HPI: [name] is a 73 y.o. female  Chief Complaint  Patient presents with  . Follow-up  . Coagulation Disorder  . Nausea    x 2 weeks. No emesis. Comes and goes.    Here today for 2 week INR f/u for subtherapeutic reading. Has been on same coumadin dose for about 2 years now with some fluctuation but overall stable readings. Alternating 1 and 1.5 tabs of 7.5 mg coumadin daily. No bleeding or bruising issues. Diet fairly stable, compliant with medication.   Epigastric pain and nausea intermittently x 2 weeks. States this has been happening occasionally for quite some time. Does not try anything OTC for sxs. No vomiting, diarrhea, constipation, weight loss, fevers, sweats. Had neg cologuard 3 years ago, due for next screening.   Consistent numbness and tingling in b/l feet, and now intermittent tingling. Very bothersome for her, especially at night. States her feet feel like rubber. Not trying anything for sxs other than wearing padded slippers at home.   Past Medical History:  Diagnosis Date  . Anginal pain ([place])   . Asthma   . Bronchitis   . CAD (coronary artery disease)   . CHF (congestive heart failure) ([place])   . CVA (cerebral infarction)   . Dysrhythmia   . GERD (gastroesophageal reflux disease)   . History of chronic atrial fibrillation   . Hypercholesteremia   . Hypertension   . Idiopathic pulmonary hypertension ([place])   . Myocardial infarction ([place])   . Obesity   . OSA (obstructive sleep apnea)   . Pre-diabetes   . Pulmonary artery hypertension ([place])   . Shortness of breath dyspnea   . Sleep apnea   . Stroke [facility])    Social History   Socioeconomic History  . Marital status: Single    Spouse name: Not on file  . Number of children: Not on  file  . Years of education: Not on file  . Highest education level: 8th grade  Occupational History  . Not on file  Social Needs  . Financial resource strain: Not hard at all  . Food insecurity:    Worry: Never true    Inability: Never true  . Transportation needs:    Medical: No    Non-medical: No  Tobacco Use  . Smoking status: Former Smoker    Packs/day: 0.25    Types: Cigarettes    Last attempt to quit: 06/15/2014    Years since quitting: 3.8  . Smokeless tobacco: Never Used  . Tobacco comment: 4-5 cigarettes a day when she smoked  Substance and Sexual Activity  . Alcohol use: Yes    Alcohol/week: 0.0 - 1.0 standard drinks    Comment: occasionally  . Drug use: No  . Sexual activity: Not on file  Lifestyle  . Physical activity:    Days per week: 0 days    Minutes per session: 0 min  . Stress: Not at all  Relationships  . Social connections:    Talks on phone: More than three times a week    Gets together: More than three times a week    Attends religious service: More than 4  times per year    Active member of club or organization: No    Attends meetings of clubs or organizations: Never    Relationship status: Widowed  . Intimate partner violence:    Fear of current or ex partner: No    Emotionally abused: No    Physically abused: No    Forced sexual activity: No  Other Topics Concern  . Not on file  Social History Narrative  . Not on file    Relevant past medical, surgical, family and social history reviewed and updated as indicated. Interim medical history since our last visit reviewed. Allergies and medications reviewed and updated.  Review of Systems  Per HPI unless specifically indicated above     Objective:    BP (!) 136/91   Pulse 72   Temp 98.9 F (37.2 C) (Oral)   Wt (!) 316 lb (143.3 kg)   SpO2 95%   BMI 52.59 kg/m   Wt Readings from Last 3 Encounters:  04/17/18 (!) 316 lb (143.3 kg)  02/27/18 (!) 321 lb (145.6 kg)  01/16/18 (!) 321 lb  3 oz (145.7 kg)    Physical Exam Vitals signs and nursing note reviewed.  Constitutional:      Appearance: Normal appearance. She is obese.  HENT:     Head: Atraumatic.  Neck:     Musculoskeletal: Normal range of motion and neck supple.  Cardiovascular:     Rate and Rhythm: Normal rate.  Pulmonary:     Effort: Pulmonary effort is normal.     Comments: On continuous O2 via nasal cannula Abdominal:     General: Bowel sounds are normal. There is no distension.     Palpations: Abdomen is soft.     Tenderness: There is no abdominal tenderness. There is no guarding.  Musculoskeletal: Normal range of motion.  Skin:    General: Skin is warm and dry.  Neurological:     Mental Status: She is alert and oriented to person, place, and time.     Sensory: Sensory deficit (decreased sensation to filament b/l feet) present.  Psychiatric:        Mood and Affect: Mood normal.        Thought Content: Thought content normal.        Judgment: Judgment normal.     Results for orders placed or performed in visit on 04/17/18  CoaguChek XS/INR Waived  Result Value Ref Range   INR 1.7 (H) 0.9 - 1.1   Prothrombin Time 20.2 sec      Assessment & Plan:   Problem List Items Addressed This Visit      Cardiovascular and Mediastinum   Atrial fibrillation ([place]) - Primary    INR still subtherapeutic. Increase to 1.5 tabs 5 days weekly and 1 tab 2 days weekly and recheck INR in 2 weeks.       Relevant Orders   CoaguChek XS/INR Waived (Completed)    Other Visit Diagnoses    Nausea       Carafate prn, continue protonix. If not improving will refer to GI for further eval.    Neuropathy       Trial gabapentin at bedtime. Risks and benefits reviewed. Will increase as needed if tolerating well.        Follow up plan: Return in about 2 weeks (around 05/01/2018) for INR, extremity numbness.

## 2018-04-20 NOTE — Assessment & Plan Note (Signed)
INR still subtherapeutic. Increase to 1.5 tabs 5 days weekly and 1 tab 2 days weekly and recheck INR in 2 weeks.

## 2018-04-22 IMAGING — MG MM BREAST LOCALIZATION CLIP
2 series · 2 of 2 positions shown · non-contrast
Comparison: Previous exam(s).

CLINICAL DATA: Status post stereotactic core biopsy right breast
calcifications

EXAM:
DIAGNOSTIC RIGHT MAMMOGRAM POST ULTRASOUND BIOPSY

[R CC]
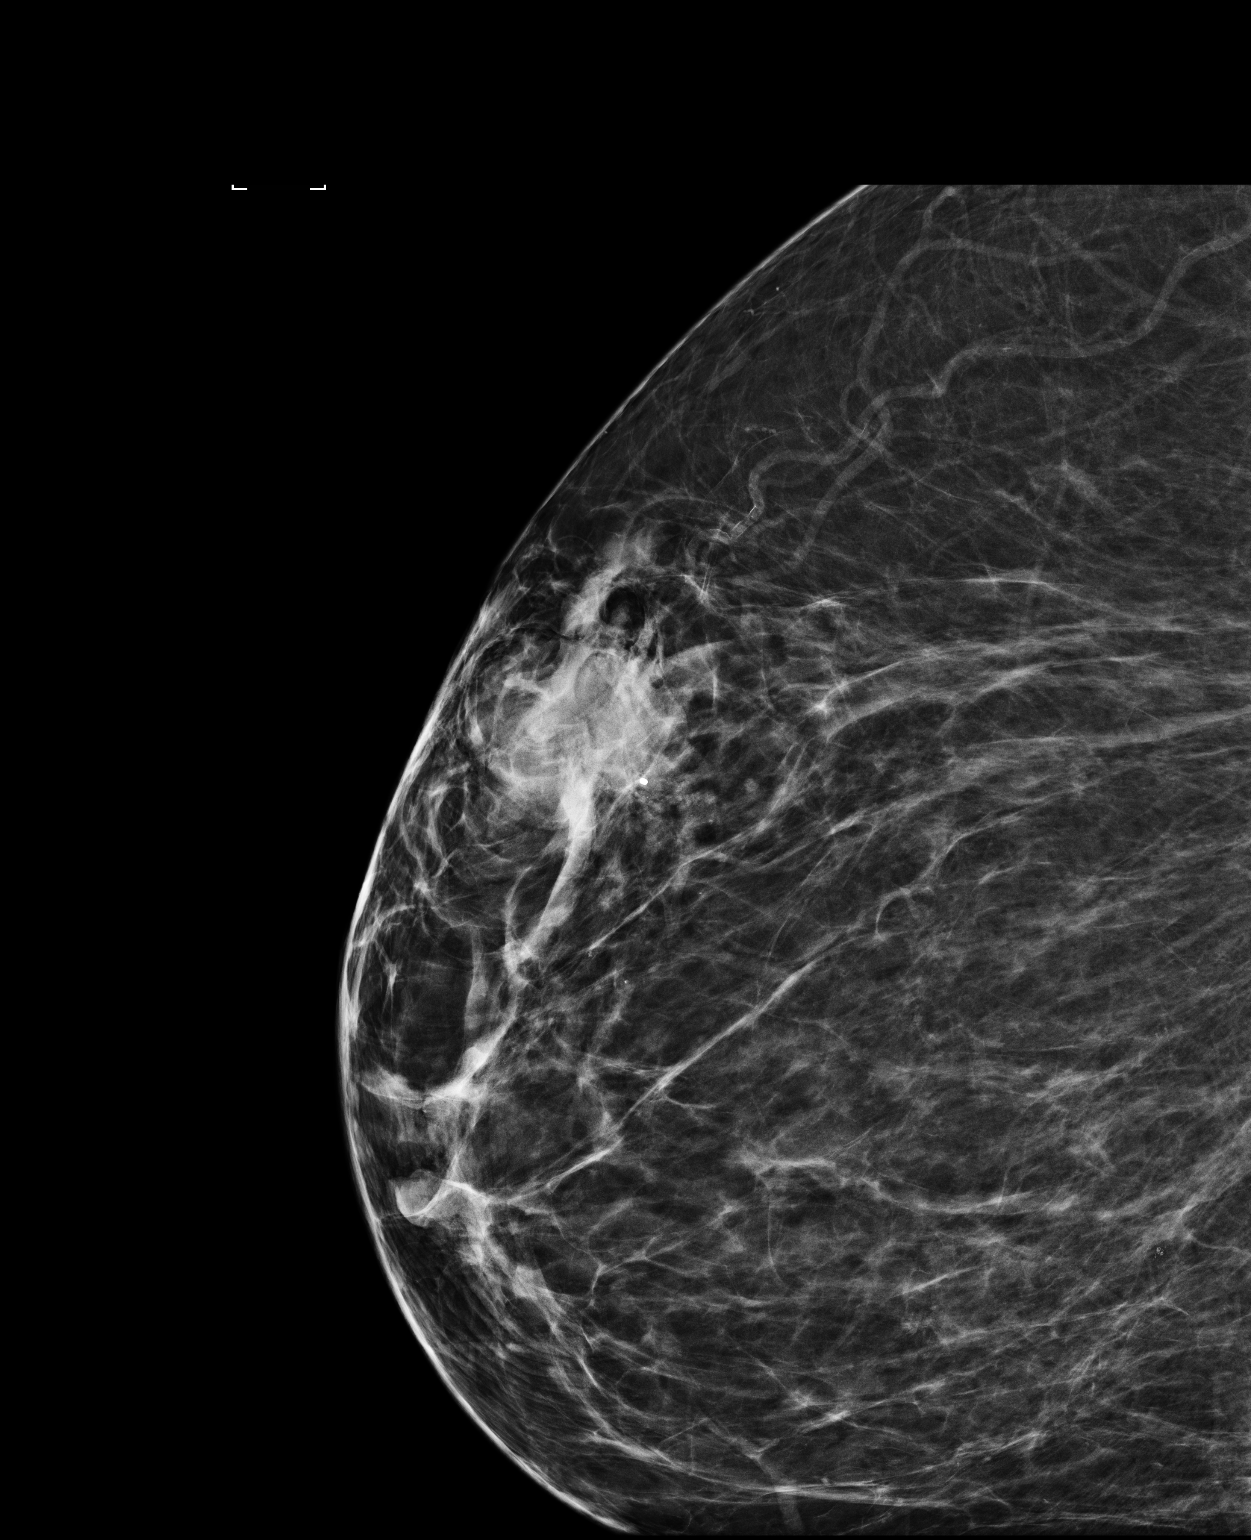

[R ML]
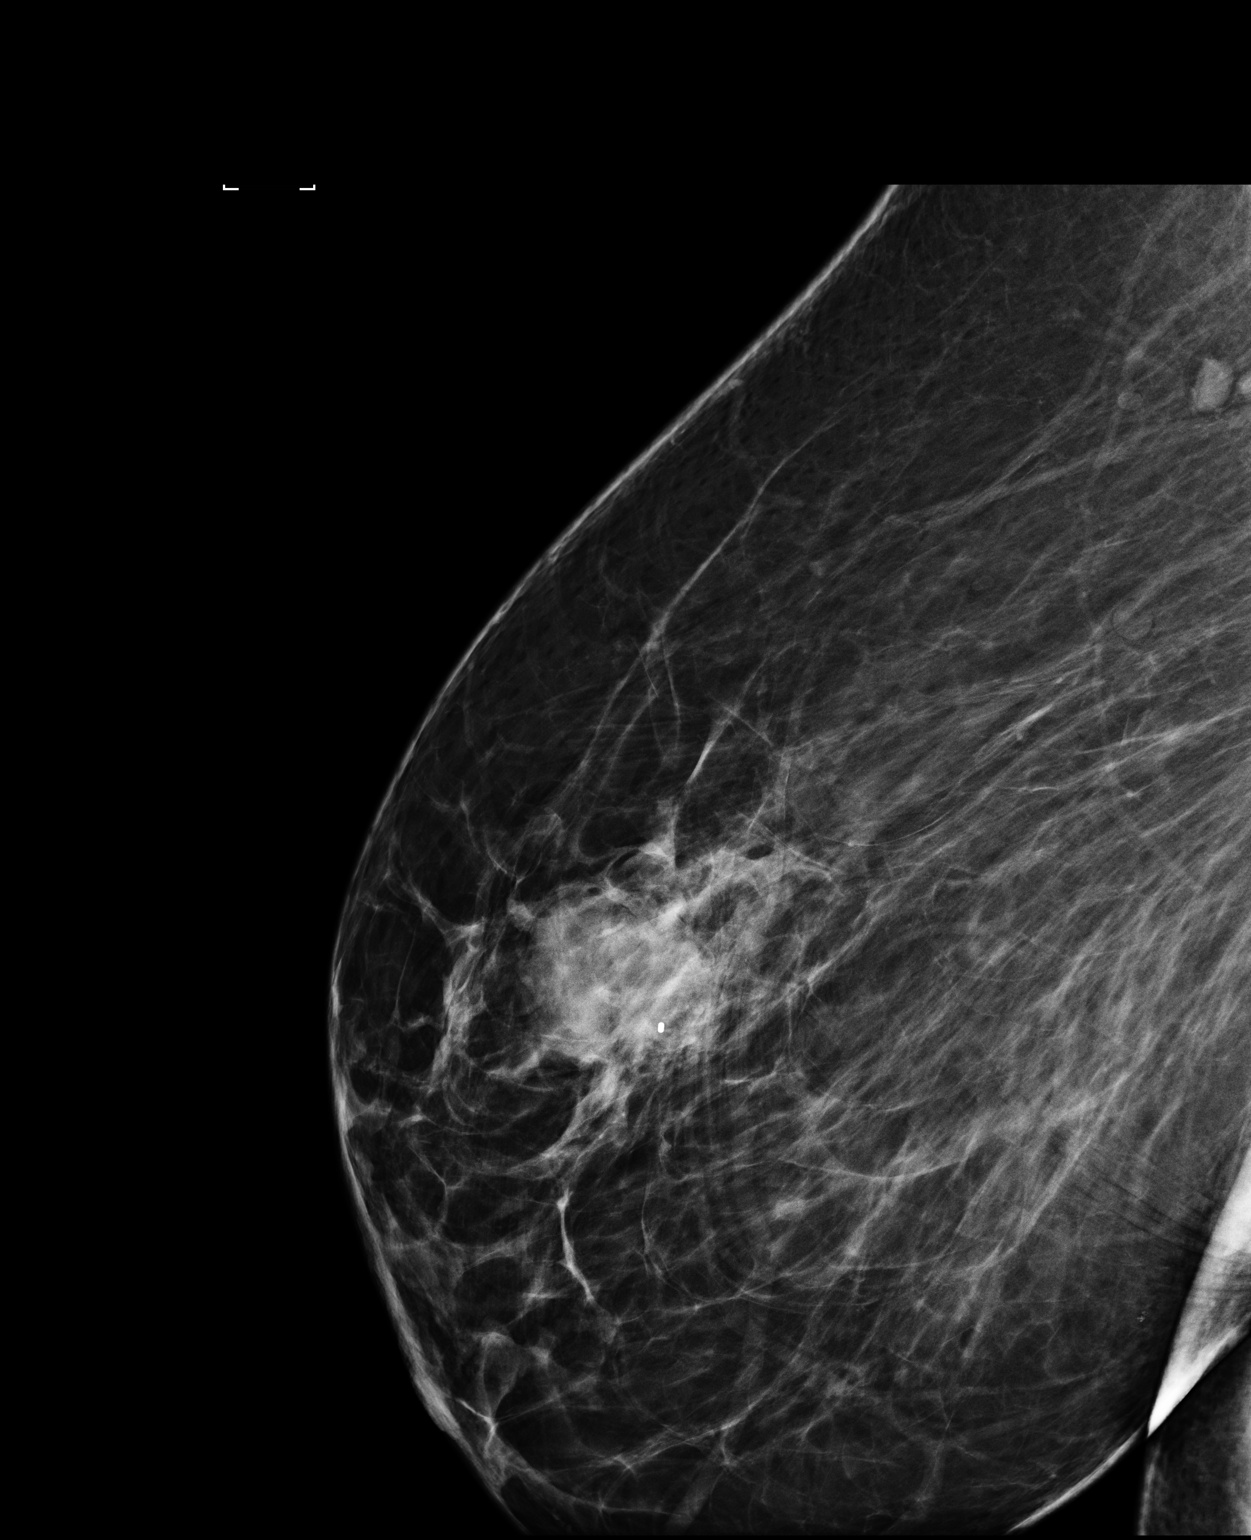

[2 of 2 positions shown; findings below may reference images not displayed]

FINDINGS: Mammographic images were obtained following stereotactic core guided
biopsy of calcifications in the upper-outer quadrant right breast.
Cc and lateral view of right breast demonstrate biopsy clip in the
area of concern.
IMPRESSION: Post biopsy mammogram demonstrating biopsy clip in the area of
concern.

Final Assessment: Post Procedure Mammograms for Marker Placement

## 2018-04-28 DIAGNOSIS — G4733 Obstructive sleep apnea (adult) (pediatric): Secondary | ICD-10-CM | POA: Diagnosis not present

## 2018-04-28 DIAGNOSIS — J449 Chronic obstructive pulmonary disease, unspecified: Secondary | ICD-10-CM | POA: Diagnosis not present

## 2018-04-28 DIAGNOSIS — I272 Pulmonary hypertension, unspecified: Secondary | ICD-10-CM | POA: Diagnosis not present

## 2018-05-01 ENCOUNTER — Encounter: Payer: Self-pay | Admitting: Family Medicine

## 2018-05-01 ENCOUNTER — Other Ambulatory Visit: Payer: Self-pay

## 2018-05-01 ENCOUNTER — Ambulatory Visit (INDEPENDENT_AMBULATORY_CARE_PROVIDER_SITE_OTHER): Payer: Medicare HMO | Admitting: Family Medicine

## 2018-05-01 ENCOUNTER — Telehealth: Payer: Self-pay

## 2018-05-01 VITALS — BP 131/83 | HR 68 | Temp 98.6°F | Ht 62.0 in | Wt 323.0 lb

## 2018-05-01 DIAGNOSIS — K219 Gastro-esophageal reflux disease without esophagitis: Secondary | ICD-10-CM | POA: Diagnosis not present

## 2018-05-01 DIAGNOSIS — Z1211 Encounter for screening for malignant neoplasm of colon: Secondary | ICD-10-CM | POA: Diagnosis not present

## 2018-05-01 DIAGNOSIS — G629 Polyneuropathy, unspecified: Secondary | ICD-10-CM | POA: Insufficient documentation

## 2018-05-01 DIAGNOSIS — G609 Hereditary and idiopathic neuropathy, unspecified: Secondary | ICD-10-CM

## 2018-05-01 DIAGNOSIS — I4891 Unspecified atrial fibrillation: Secondary | ICD-10-CM

## 2018-05-01 LAB — COAGUCHEK XS/INR WAIVED
INR: 1.8 — ABNORMAL HIGH (ref 0.9–1.1)
Prothrombin Time: 21.9 s

## 2018-05-01 MED ORDER — APIXABAN 5 MG PO TABS: 5.0000 mg | ORAL_TABLET | Freq: Two times a day (BID) | ORAL | 0 refills | Status: DC

## 2018-05-01 MED ORDER — GABAPENTIN 300 MG PO CAPS: 300.0000 mg | ORAL_CAPSULE | Freq: Every day | ORAL | 5 refills | Status: DC

## 2018-05-01 NOTE — Patient Instructions (Addendum)
You can stop taking the pantoprazole and sucralfate regularly and use them AS NEEDED for nausea  Stop the warfarin, start the eliquis TWICE DAILY for your blood thinner  Continue taking the gabapentin at bedtime as needed  STOP taking the 81 mg aspirin and stay away from over the counter NSAIDs (aleve, advil, naproxen, ibuprofen)

## 2018-05-01 NOTE — Telephone Encounter (Signed)
-----   Message from Volney American, [place] sent at 05/01/2018 12:11 PM EST ----- Regarding: cologuard I just ordered a cologuard on her. Thanks

## 2018-05-01 NOTE — Assessment & Plan Note (Signed)
Sxs resolved with protonix and carafate. Can use prn if sxs return

## 2018-05-01 NOTE — Assessment & Plan Note (Signed)
Good response to low dose gabapentin. Continue as needed use at bedtime

## 2018-05-01 NOTE — Progress Notes (Signed)
BP 131/83   Pulse 68   Temp 98.6 F (37 C) (Oral)   Ht 5\' 2"  (1.575 m)   Wt (!) 323 lb (146.5 kg)   SpO2 95%   BMI 59.08 kg/m    Subjective:    Patient ID: Michelle Jenkins, female    DOB: Mar 13, 1946, 73 y.o.   MRN: [id number]  HPI: Michelle Jenkins is a 73 y.o. female  Chief Complaint  Patient presents with  . Coagulation Disorder    f/u  . Numbness    bilateral feet   Here today for 2 week INR f/u after dose adjusting her coumadin d/t subtherapeutic reading of 1.7. Now taking 1.5 tablets of 7.5 mg coumadin 5 days per week and 1 tablet of 7.5 mg coumadin 2 days per week. No bleeding or bruising issues, CP, palpitations, SOB. Diet stable.   Started protonix and prn carafate for her persistent nausea and bloating with complete resolution after 2 weeks. Wanting to hold off on GI referral at this time.   Started gabapentin 1 capsule nightly with good relief of her numbness and tingling in her hands and feet. No side effects reported.   Relevant past medical, surgical, family and social history reviewed and updated as indicated. Interim medical history since our last visit reviewed. Allergies and medications reviewed and updated.  Review of Systems  Per HPI unless specifically indicated above     Objective:    BP 131/83   Pulse 68   Temp 98.6 F (37 C) (Oral)   Ht 5\' 2"  (1.575 m)   Wt (!) 323 lb (146.5 kg)   SpO2 95%   BMI 59.08 kg/m   Wt Readings from Last 3 Encounters:  05/01/18 (!) 323 lb (146.5 kg)  04/17/18 (!) 316 lb (143.3 kg)  02/27/18 (!) 321 lb (145.6 kg)    Physical Exam Vitals signs and nursing note reviewed.  Constitutional:      Appearance: Normal appearance. She is not ill-appearing.  HENT:     Head: Atraumatic.  Eyes:     Extraocular Movements: Extraocular movements intact.     Conjunctiva/sclera: Conjunctivae normal.  Neck:     Musculoskeletal: Normal range of motion and neck supple.  Cardiovascular:     Rate and Rhythm: Normal rate and  regular rhythm.     Heart sounds: Normal heart sounds.  Pulmonary:     Effort: Pulmonary effort is normal. No respiratory distress.     Comments: On continuous O2 via nasal cannula Abdominal:     General: Bowel sounds are normal. There is no distension.     Palpations: Abdomen is soft. There is no mass.     Tenderness: There is no abdominal tenderness. There is no guarding.  Musculoskeletal: Normal range of motion.  Skin:    General: Skin is warm and dry.  Neurological:     Mental Status: She is alert and oriented to person, place, and time.     Sensory: No sensory deficit.  Psychiatric:        Mood and Affect: Mood normal.        Thought Content: Thought content normal.        Judgment: Judgment normal.     Results for orders placed or performed in visit on 04/17/18  CoaguChek XS/INR Waived  Result Value Ref Range   INR 1.7 (H) 0.9 - 1.1   Prothrombin Time 20.2 sec      Assessment & Plan:   Problem List Items Addressed  This Visit      Cardiovascular and Mediastinum   Atrial fibrillation ([place]) - Primary    Persistently subtherapeutic the past few months despite being on high doses of coumadin - long discussion today about switching to eliquis, pt agreeable. Reviewed risks. Will recheck labs and tolerance in 1 month      Relevant Medications   apixaban (ELIQUIS) 5 MG TABS tablet   Other Relevant Orders   CoaguChek XS/INR Waived     Digestive   GERD (gastroesophageal reflux disease)    Sxs resolved with protonix and carafate. Can use prn if sxs return        Nervous and Auditory   Peripheral neuropathy    Good response to low dose gabapentin. Continue as needed use at bedtime      Relevant Medications   gabapentin (NEURONTIN) 300 MG capsule    Other Visit Diagnoses    Screening for colon cancer       Will order cologuard, got letter from GI that she was due for repeat screening and was deemed high risk for full colonoscopy   Relevant Orders   Cologuard        Follow up plan: Return in about 4 weeks (around 05/29/2018) for [place] f/u.

## 2018-05-01 NOTE — Assessment & Plan Note (Signed)
Persistently subtherapeutic the past few months despite being on high doses of coumadin - long discussion today about switching to eliquis, pt agreeable. Reviewed risks. Will recheck labs and tolerance in 1 month

## 2018-05-16 ENCOUNTER — Ambulatory Visit (INDEPENDENT_AMBULATORY_CARE_PROVIDER_SITE_OTHER): Payer: Medicare HMO | Admitting: Family Medicine

## 2018-05-16 ENCOUNTER — Encounter: Payer: Self-pay | Admitting: Family Medicine

## 2018-05-16 VITALS — BP 129/85 | HR 71 | Temp 98.6°F | Wt 326.1 lb

## 2018-05-16 DIAGNOSIS — N898 Other specified noninflammatory disorders of vagina: Secondary | ICD-10-CM | POA: Diagnosis not present

## 2018-05-16 DIAGNOSIS — G629 Polyneuropathy, unspecified: Secondary | ICD-10-CM

## 2018-05-16 LAB — UA/M W/RFLX CULTURE, ROUTINE
Bilirubin, UA: NEGATIVE
Glucose, UA: NEGATIVE
Ketones, UA: NEGATIVE
Leukocytes, UA: NEGATIVE
Nitrite, UA: NEGATIVE
Protein, UA: NEGATIVE
RBC, UA: NEGATIVE
Specific Gravity, UA: 1.015 (ref 1.005–1.030)
Urobilinogen, Ur: 0.2 mg/dL (ref 0.2–1.0)
pH, UA: 7 (ref 5.0–7.5)

## 2018-05-16 LAB — WET PREP FOR TRICH, YEAST, CLUE
Clue Cell Exam: NEGATIVE
Trichomonas Exam: NEGATIVE
Yeast Exam: NEGATIVE

## 2018-05-16 NOTE — Progress Notes (Signed)
BP 129/85   Pulse 71   Temp 98.6 F (37 C) (Oral)   Wt (!) 326 lb 1.6 oz (147.9 kg)   SpO2 96%   BMI 59.64 kg/m    Subjective:    Patient ID: Michelle Jenkins, female    DOB: October 11, 1945, 73 y.o.   MRN: [id number]  HPI: Michelle Jenkins is a 73 y.o. female  Chief Complaint  Patient presents with  . odor    pt states that she smells a different odor than normal coming from her body, not exactly sure where it is coming from    Here today concerned about a new odor she's smelling on herself the past few weeks. No idea where it could be coming from. States she even smells it right after she gets out of the shower sometimes. No fevers, recent URI, vaginal discharge, urinary sxs, bowel incontinence, excessive sweating. States this happened about 5 years ago for a while and then resolved spontaneously, never found cause back then.   Worsening RLS sxs, gabapentin helping the cramping feeling in legs but states her legs are trying to run away from her and her feet still feel dead at the bottom she states. Requesting referral to specialist.   Relevant past medical, surgical, family and social history reviewed and updated as indicated. Interim medical history since our last visit reviewed. Allergies and medications reviewed and updated.  Review of Systems  Per HPI unless specifically indicated above     Objective:    BP 129/85   Pulse 71   Temp 98.6 F (37 C) (Oral)   Wt (!) 326 lb 1.6 oz (147.9 kg)   SpO2 96%   BMI 59.64 kg/m   Wt Readings from Last 3 Encounters:  05/16/18 (!) 326 lb 1.6 oz (147.9 kg)  05/01/18 (!) 323 lb (146.5 kg)  04/17/18 (!) 316 lb (143.3 kg)    Physical Exam Vitals signs and nursing note reviewed.  Constitutional:      Appearance: Normal appearance. She is not ill-appearing.  HENT:     Head: Atraumatic.     Right Ear: Tympanic membrane normal.     Left Ear: Tympanic membrane normal.     Nose:     Comments: Nasal mucosa mildly erythematous  Mouth/Throat:     Mouth: Mucous membranes are moist.     Pharynx: Oropharynx is clear.  Eyes:     Extraocular Movements: Extraocular movements intact.     Conjunctiva/sclera: Conjunctivae normal.  Neck:     Musculoskeletal: Normal range of motion and neck supple.  Cardiovascular:     Rate and Rhythm: Normal rate.     Pulses: Normal pulses.  Pulmonary:     Effort: Pulmonary effort is normal.     Breath sounds: Normal breath sounds.     Comments: On continuous O2 via nasal cannula Abdominal:     General: Bowel sounds are normal.     Palpations: Abdomen is soft.  Genitourinary:    Vagina: No vaginal discharge.  Musculoskeletal: Normal range of motion.  Skin:    General: Skin is warm and dry.  Neurological:     Mental Status: She is alert and oriented to person, place, and time.  Psychiatric:        Mood and Affect: Mood normal.        Thought Content: Thought content normal.        Judgment: Judgment normal.     Results for orders placed or performed in visit on  05/01/18  CoaguChek XS/INR Waived  Result Value Ref Range   INR 1.8 (H) 0.9 - 1.1   Prothrombin Time 21.9 sec      Assessment & Plan:   Problem List Items Addressed This Visit    None    Visit Diagnoses    Vaginal odor    -  Primary   Wet prep and U/A neg, exam and vitals benign. Potentially hormonal changes. Continue to monitor, f/u if worsening sxs   Relevant Orders   UA/M w/rflx Culture, Routine   WET PREP FOR TRICH, YEAST, CLUE   Neuropathy       Will refer to Neurology per pt request for long standing and worsening peripheral neuropathy. Does not wish to increase gabapentin or change medications   Relevant Orders   Ambulatory referral to Neurology       Follow up plan: Return for as scheduled.

## 2018-05-17 ENCOUNTER — Other Ambulatory Visit: Payer: Self-pay | Admitting: Family Medicine

## 2018-05-28 LAB — COLOGUARD: Cologuard: NEGATIVE

## 2018-05-29 DIAGNOSIS — G4733 Obstructive sleep apnea (adult) (pediatric): Secondary | ICD-10-CM | POA: Diagnosis not present

## 2018-05-29 DIAGNOSIS — J449 Chronic obstructive pulmonary disease, unspecified: Secondary | ICD-10-CM | POA: Diagnosis not present

## 2018-05-29 DIAGNOSIS — I272 Pulmonary hypertension, unspecified: Secondary | ICD-10-CM | POA: Diagnosis not present

## 2018-05-30 DIAGNOSIS — G4733 Obstructive sleep apnea (adult) (pediatric): Secondary | ICD-10-CM | POA: Diagnosis not present

## 2018-05-30 DIAGNOSIS — R0609 Other forms of dyspnea: Secondary | ICD-10-CM | POA: Diagnosis not present

## 2018-05-30 DIAGNOSIS — I272 Pulmonary hypertension, unspecified: Secondary | ICD-10-CM | POA: Diagnosis not present

## 2018-05-30 DIAGNOSIS — J449 Chronic obstructive pulmonary disease, unspecified: Secondary | ICD-10-CM | POA: Diagnosis not present

## 2018-06-02 ENCOUNTER — Other Ambulatory Visit: Payer: Self-pay | Admitting: Family Medicine

## 2018-06-02 DIAGNOSIS — Z1211 Encounter for screening for malignant neoplasm of colon: Secondary | ICD-10-CM | POA: Diagnosis not present

## 2018-06-05 ENCOUNTER — Ambulatory Visit (INDEPENDENT_AMBULATORY_CARE_PROVIDER_SITE_OTHER): Payer: Medicare HMO | Admitting: Family Medicine

## 2018-06-05 ENCOUNTER — Encounter: Payer: Self-pay | Admitting: Family Medicine

## 2018-06-05 ENCOUNTER — Other Ambulatory Visit: Payer: Self-pay

## 2018-06-05 VITALS — BP 128/78 | HR 75 | Temp 97.3°F | Ht 62.0 in | Wt 327.0 lb

## 2018-06-05 DIAGNOSIS — I4891 Unspecified atrial fibrillation: Secondary | ICD-10-CM | POA: Diagnosis not present

## 2018-06-05 LAB — COAGUCHEK XS/INR WAIVED
INR: 1.2 — ABNORMAL HIGH (ref 0.9–1.1)
Prothrombin Time: 14.1 s

## 2018-06-05 NOTE — Progress Notes (Signed)
   BP 128/78   Pulse 75   Temp (!) 97.3 F (36.3 C) (Oral)   Ht 5\' 2"  (1.575 m)   Wt (!) 327 lb (148.3 kg)   SpO2 96%   BMI 59.81 kg/m    Subjective:    Patient ID: Michelle Jenkins, female    DOB: 1945-10-22, 73 y.o.   MRN: [id number]  HPI: Michelle Jenkins is a 73 y.o. female  Chief Complaint  Patient presents with  . Coagulation Disorder    f/u   Here today for atrial fibrillation f/u after transitioning from coumadin to eliquis. Tolerating well, no bleeding or bruising issues or other side effects noted. Rate well controlled on labetalol. Denies CP, SOB, palpitations, dizziness.   Relevant past medical, surgical, family and social history reviewed and updated as indicated. Interim medical history since our last visit reviewed. Allergies and medications reviewed and updated.  Review of Systems  Per HPI unless specifically indicated above     Objective:    BP 128/78   Pulse 75   Temp (!) 97.3 F (36.3 C) (Oral)   Ht 5\' 2"  (1.575 m)   Wt (!) 327 lb (148.3 kg)   SpO2 96%   BMI 59.81 kg/m   Wt Readings from Last 3 Encounters:  06/05/18 (!) 327 lb (148.3 kg)  05/16/18 (!) 326 lb 1.6 oz (147.9 kg)  05/01/18 (!) 323 lb (146.5 kg)    Physical Exam Vitals signs and nursing note reviewed.  Constitutional:      Appearance: Normal appearance. She is not ill-appearing.  HENT:     Head: Atraumatic.  Eyes:     Extraocular Movements: Extraocular movements intact.     Conjunctiva/sclera: Conjunctivae normal.  Neck:     Musculoskeletal: Normal range of motion and neck supple.  Cardiovascular:     Rate and Rhythm: Normal rate.  Pulmonary:     Effort: Pulmonary effort is normal.     Breath sounds: Normal breath sounds.  Musculoskeletal: Normal range of motion.  Skin:    General: Skin is warm and dry.  Neurological:     Mental Status: She is alert and oriented to person, place, and time.  Psychiatric:        Mood and Affect: Mood normal.        Thought Content:  Thought content normal.        Judgment: Judgment normal.     Results for orders placed or performed in visit on 06/05/18  CoaguChek XS/INR Waived  Result Value Ref Range   INR 1.2 (H) 0.9 - 1.1   Prothrombin Time 14.1 sec      Assessment & Plan:   Problem List Items Addressed This Visit      Cardiovascular and Mediastinum   Atrial fibrillation ([place]) - Primary    Stable on eliquis, continue current regimen      Relevant Orders   CoaguChek XS/INR Waived (Completed)       Follow up plan: Return in about 3 months (around 09/03/2018) for 6 month f/u.

## 2018-06-10 NOTE — Assessment & Plan Note (Signed)
Stable on eliquis, continue current regimen

## 2018-06-12 DIAGNOSIS — J449 Chronic obstructive pulmonary disease, unspecified: Secondary | ICD-10-CM | POA: Diagnosis not present

## 2018-06-12 DIAGNOSIS — I272 Pulmonary hypertension, unspecified: Secondary | ICD-10-CM | POA: Diagnosis not present

## 2018-06-12 DIAGNOSIS — G4733 Obstructive sleep apnea (adult) (pediatric): Secondary | ICD-10-CM | POA: Diagnosis not present

## 2018-06-18 ENCOUNTER — Telehealth: Payer: Self-pay | Admitting: Family Medicine

## 2018-06-18 NOTE — Telephone Encounter (Signed)
Copied from [place] [id number]. Topic: General - Other >> Jun 18, 2018  1:20 PM Lennox Solders wrote: reason for CRM: pt is calling and she can not remember if she suppose to still be taking asa 81 mg.

## 2018-06-18 NOTE — Telephone Encounter (Signed)
Patient notified

## 2018-06-18 NOTE — Telephone Encounter (Signed)
Routing to provider  

## 2018-06-18 NOTE — Telephone Encounter (Signed)
Does not need to be taking a baby aspirin

## 2018-06-24 ENCOUNTER — Ambulatory Visit: Payer: Self-pay

## 2018-06-24 NOTE — Telephone Encounter (Signed)
Pt called to report that she has COPD and is visiting her brother in a nursing home daily.  She is seeking advice for her safety. Pt was advised to use normal precautions and not attend if someone is sick or she is sick.  She denies symptoms at this time.  Reason for Disposition . Health Information question, no triage required and triager able to answer question  Answer Assessment - Initial Assessment Questions 1. REASON FOR CALL or QUESTION: "What is your reason for calling today?" or "How can I best help you?" or "What question do you have that I can help answer?"     Pt expressing cocern that she visits her brother daily at a nursing facility.  She is concerned for her health because she has COPD .  Protocols used: INFORMATION ONLY CALL-A-AH

## 2018-06-27 DIAGNOSIS — J449 Chronic obstructive pulmonary disease, unspecified: Secondary | ICD-10-CM | POA: Diagnosis not present

## 2018-06-27 DIAGNOSIS — I272 Pulmonary hypertension, unspecified: Secondary | ICD-10-CM | POA: Diagnosis not present

## 2018-06-27 DIAGNOSIS — G4733 Obstructive sleep apnea (adult) (pediatric): Secondary | ICD-10-CM | POA: Diagnosis not present

## 2018-06-30 ENCOUNTER — Other Ambulatory Visit: Payer: Self-pay | Admitting: Family Medicine

## 2018-07-09 DIAGNOSIS — I4891 Unspecified atrial fibrillation: Secondary | ICD-10-CM | POA: Diagnosis not present

## 2018-07-09 DIAGNOSIS — E785 Hyperlipidemia, unspecified: Secondary | ICD-10-CM | POA: Diagnosis not present

## 2018-07-09 DIAGNOSIS — Z6841 Body Mass Index (BMI) 40.0 and over, adult: Secondary | ICD-10-CM | POA: Diagnosis not present

## 2018-07-09 DIAGNOSIS — I509 Heart failure, unspecified: Secondary | ICD-10-CM | POA: Diagnosis not present

## 2018-07-09 DIAGNOSIS — I739 Peripheral vascular disease, unspecified: Secondary | ICD-10-CM | POA: Diagnosis not present

## 2018-07-09 DIAGNOSIS — Z008 Encounter for other general examination: Secondary | ICD-10-CM | POA: Diagnosis not present

## 2018-07-09 DIAGNOSIS — G629 Polyneuropathy, unspecified: Secondary | ICD-10-CM | POA: Diagnosis not present

## 2018-07-09 DIAGNOSIS — G2581 Restless legs syndrome: Secondary | ICD-10-CM | POA: Diagnosis not present

## 2018-07-09 DIAGNOSIS — I110 Hypertensive heart disease with heart failure: Secondary | ICD-10-CM | POA: Diagnosis not present

## 2018-07-09 DIAGNOSIS — J439 Emphysema, unspecified: Secondary | ICD-10-CM | POA: Diagnosis not present

## 2018-07-11 DIAGNOSIS — J449 Chronic obstructive pulmonary disease, unspecified: Secondary | ICD-10-CM | POA: Diagnosis not present

## 2018-07-11 DIAGNOSIS — I272 Pulmonary hypertension, unspecified: Secondary | ICD-10-CM | POA: Diagnosis not present

## 2018-07-11 DIAGNOSIS — G4733 Obstructive sleep apnea (adult) (pediatric): Secondary | ICD-10-CM | POA: Diagnosis not present

## 2018-07-14 ENCOUNTER — Telehealth: Payer: Self-pay | Admitting: Family Medicine

## 2018-07-14 NOTE — Telephone Encounter (Signed)
Copied from [place] [phone]. Topic: Quick Communication - Rx Refill/Question >> Jul 14, 2018 10:58 AM Margot Ables wrote: Medication: sucralfate (CARAFATE) 1 g tablet - pt stated she has not been taking this since Warfarin was d/c (mid January?) She isn't sure if she is supposed to take it gabapentin (NEURONTIN) 300 MG capsule - pt is taking this at night and believes it is correct ELIQUIS 5 MG TABS tablet - pt states she this was added to replace warfarin  Pt needing clarification on medications

## 2018-07-14 NOTE — Telephone Encounter (Signed)
She is taking eliquis instead of warfarin now, and gabapentin at bedtime as needed for RLS. The carafate was as needed for her stomach burning/GERD sxs but does not need to be continued if feeling better

## 2018-07-14 NOTE — Telephone Encounter (Signed)
Patient notified and verbalized understanding. 

## 2018-07-15 ENCOUNTER — Encounter: Payer: Self-pay | Admitting: Family Medicine

## 2018-07-15 NOTE — Telephone Encounter (Signed)
This encounter was created in error - please disregard.

## 2018-07-17 ENCOUNTER — Telehealth: Payer: Self-pay | Admitting: Family Medicine

## 2018-07-17 NOTE — Telephone Encounter (Signed)
Copied from [place] [phone]. Topic: Quick Communication - See Telephone Encounter >> Jul 17, 2018 11:50 AM Robina Ade, Helene Kelp D wrote: CRM for notification. See Telephone encounter for: 07/17/18. Patient daughter called and said that patient is panicking because she opd and cant get any sanitizing stuff and wants to know if pcp can help her with this. Please call pt back, thanks.

## 2018-07-17 NOTE — Telephone Encounter (Signed)
Called and spoke with patient. She is living alone and a little scared. Reassured she was doing everything she could to keep her self safe.

## 2018-07-17 NOTE — Telephone Encounter (Signed)
Please call pt and clarify what pt is needing

## 2018-07-28 DIAGNOSIS — J449 Chronic obstructive pulmonary disease, unspecified: Secondary | ICD-10-CM | POA: Diagnosis not present

## 2018-07-28 DIAGNOSIS — I272 Pulmonary hypertension, unspecified: Secondary | ICD-10-CM | POA: Diagnosis not present

## 2018-07-28 DIAGNOSIS — G4733 Obstructive sleep apnea (adult) (pediatric): Secondary | ICD-10-CM | POA: Diagnosis not present

## 2018-07-31 IMAGING — CR DG CHEST 2V
1 series · 2 of 2 positions shown · non-contrast
Comparison: 07/26/2014

CLINICAL DATA: Cough

EXAM:
CHEST  2 VIEW

[Series 1: dg chest 2 view · 0.14mm/px · 2 of 2 slices shown]
[im 1/2]
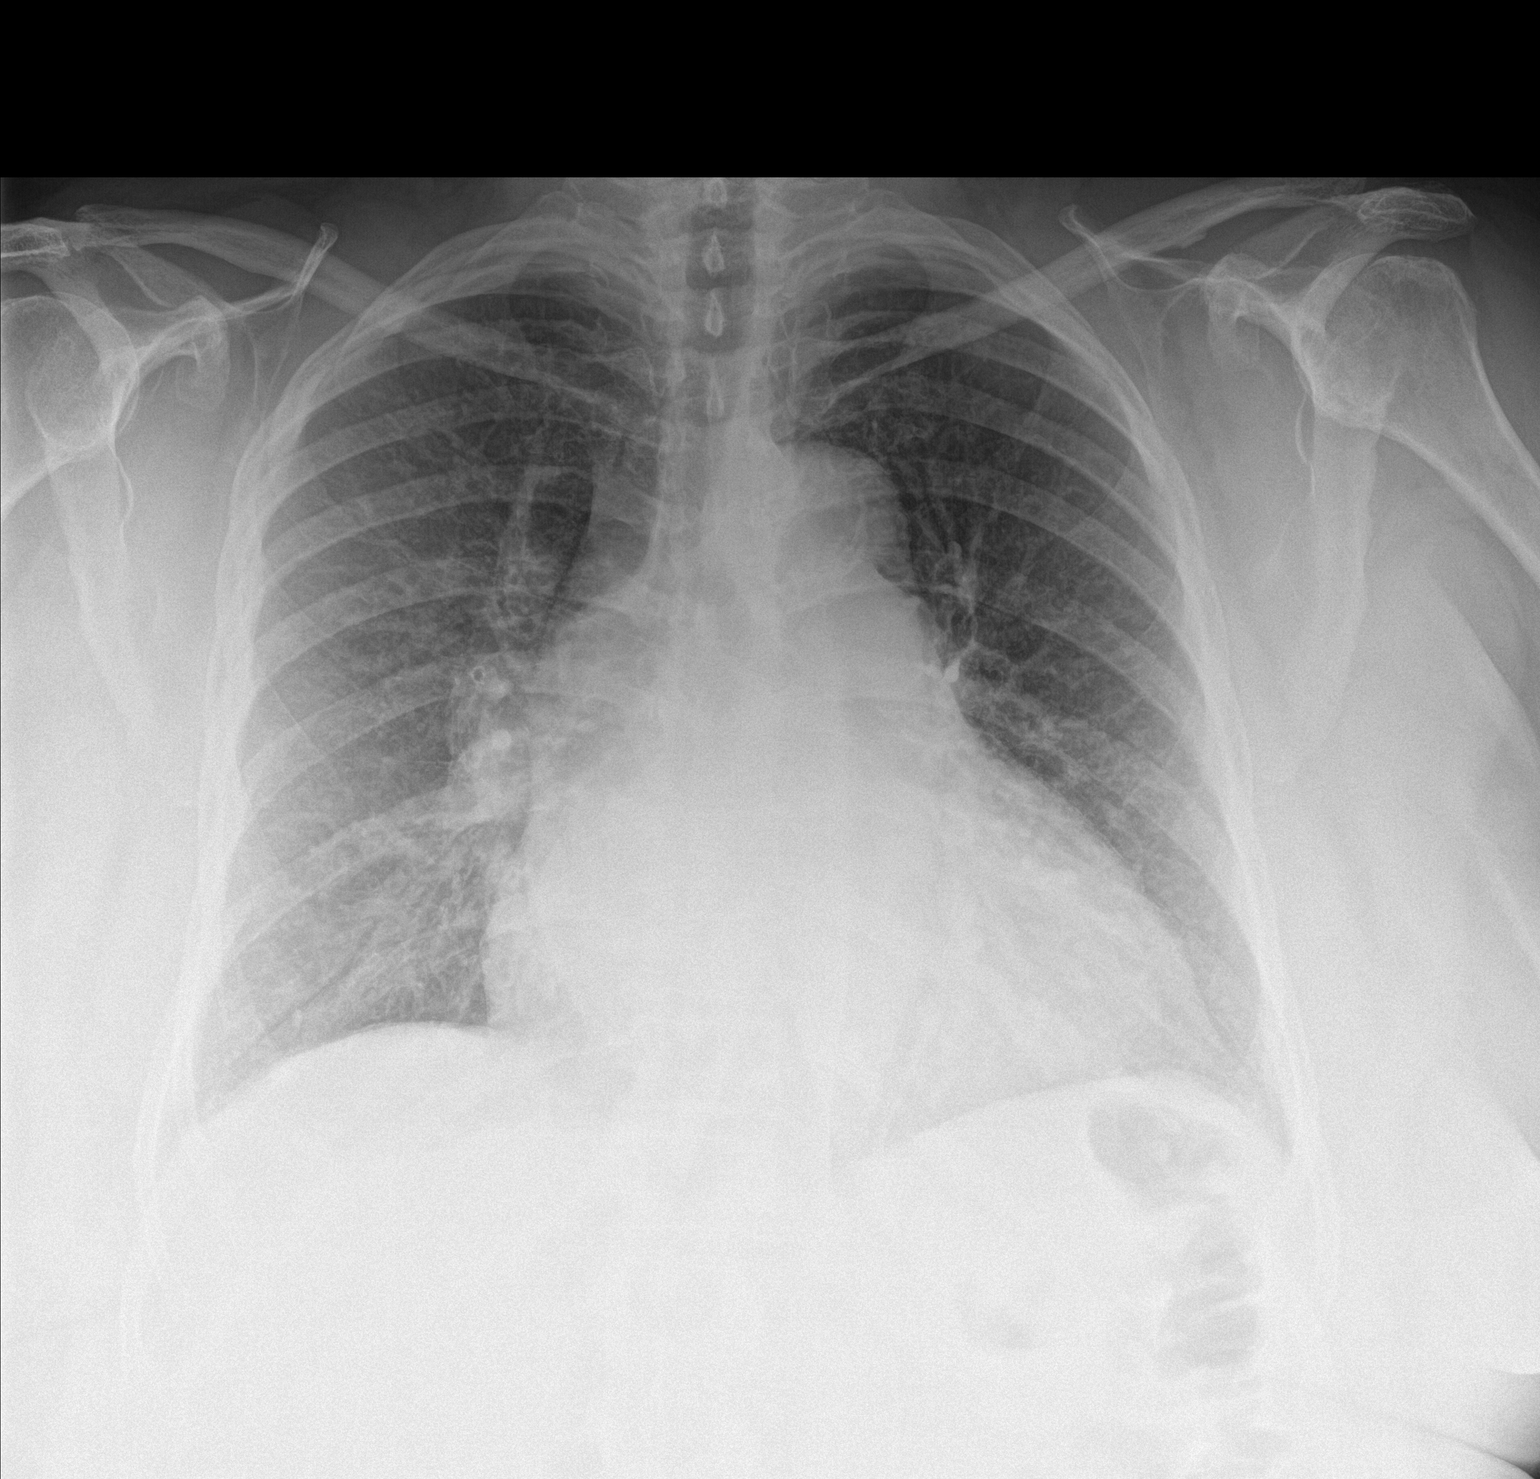
[im 2/2]
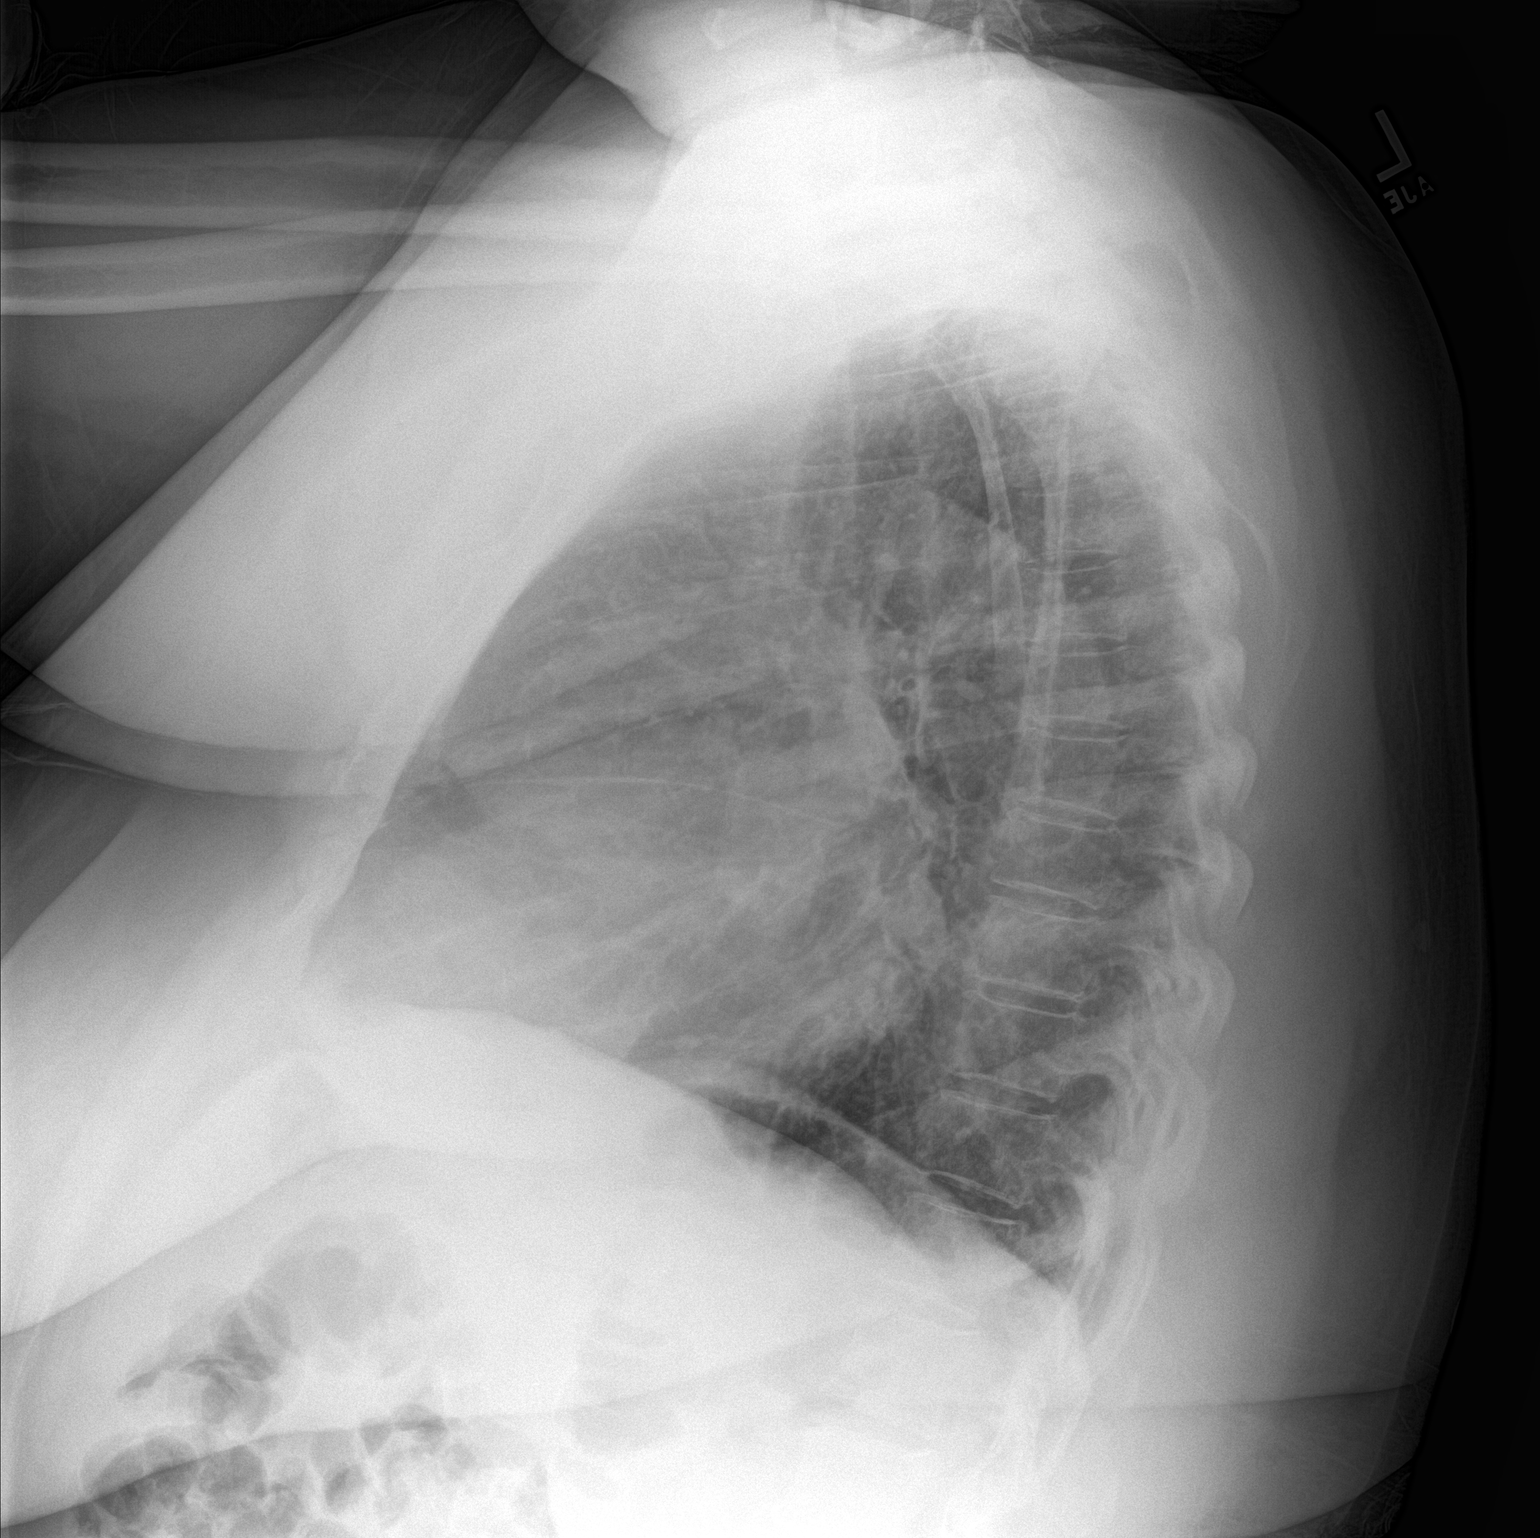

[2 of 2 positions shown; findings below may reference images not displayed]

FINDINGS: Cardiac shadow is enlarged. The lungs are well aerated bilaterally.
No focal infiltrate or sizable effusion is seen. Mild vascular
congestion is again noted and stable. No significant pulmonary edema
is seen. No bony abnormality is noted.
IMPRESSION: Cardiomegaly and mild vascular congestion stable from the prior
exam.

## 2018-08-27 DIAGNOSIS — G4733 Obstructive sleep apnea (adult) (pediatric): Secondary | ICD-10-CM | POA: Diagnosis not present

## 2018-08-27 DIAGNOSIS — I272 Pulmonary hypertension, unspecified: Secondary | ICD-10-CM | POA: Diagnosis not present

## 2018-08-27 DIAGNOSIS — J449 Chronic obstructive pulmonary disease, unspecified: Secondary | ICD-10-CM | POA: Diagnosis not present

## 2018-09-09 ENCOUNTER — Ambulatory Visit: Payer: Medicare HMO | Admitting: Family Medicine

## 2018-09-26 ENCOUNTER — Other Ambulatory Visit: Payer: Self-pay | Admitting: Family Medicine

## 2018-09-27 DIAGNOSIS — G4733 Obstructive sleep apnea (adult) (pediatric): Secondary | ICD-10-CM | POA: Diagnosis not present

## 2018-09-27 DIAGNOSIS — J449 Chronic obstructive pulmonary disease, unspecified: Secondary | ICD-10-CM | POA: Diagnosis not present

## 2018-09-27 DIAGNOSIS — I272 Pulmonary hypertension, unspecified: Secondary | ICD-10-CM | POA: Diagnosis not present

## 2018-10-01 DIAGNOSIS — G4733 Obstructive sleep apnea (adult) (pediatric): Secondary | ICD-10-CM | POA: Diagnosis not present

## 2018-10-01 DIAGNOSIS — R0600 Dyspnea, unspecified: Secondary | ICD-10-CM | POA: Diagnosis not present

## 2018-10-01 DIAGNOSIS — J810 Acute pulmonary edema: Secondary | ICD-10-CM | POA: Diagnosis not present

## 2018-10-01 DIAGNOSIS — Z9981 Dependence on supplemental oxygen: Secondary | ICD-10-CM | POA: Diagnosis not present

## 2018-10-01 DIAGNOSIS — R0789 Other chest pain: Secondary | ICD-10-CM | POA: Diagnosis not present

## 2018-10-01 DIAGNOSIS — I272 Pulmonary hypertension, unspecified: Secondary | ICD-10-CM | POA: Diagnosis not present

## 2018-10-14 DIAGNOSIS — G4733 Obstructive sleep apnea (adult) (pediatric): Secondary | ICD-10-CM | POA: Diagnosis not present

## 2018-10-21 ENCOUNTER — Other Ambulatory Visit: Payer: Self-pay | Admitting: Family Medicine

## 2018-10-21 DIAGNOSIS — Z1231 Encounter for screening mammogram for malignant neoplasm of breast: Secondary | ICD-10-CM

## 2018-10-22 DIAGNOSIS — I480 Paroxysmal atrial fibrillation: Secondary | ICD-10-CM | POA: Diagnosis not present

## 2018-10-22 DIAGNOSIS — I270 Primary pulmonary hypertension: Secondary | ICD-10-CM | POA: Diagnosis not present

## 2018-10-22 DIAGNOSIS — Z87891 Personal history of nicotine dependence: Secondary | ICD-10-CM | POA: Diagnosis not present

## 2018-10-22 DIAGNOSIS — E782 Mixed hyperlipidemia: Secondary | ICD-10-CM | POA: Diagnosis not present

## 2018-10-22 DIAGNOSIS — I2721 Secondary pulmonary arterial hypertension: Secondary | ICD-10-CM | POA: Diagnosis not present

## 2018-10-22 DIAGNOSIS — R0602 Shortness of breath: Secondary | ICD-10-CM | POA: Diagnosis not present

## 2018-10-22 DIAGNOSIS — G5600 Carpal tunnel syndrome, unspecified upper limb: Secondary | ICD-10-CM | POA: Diagnosis not present

## 2018-10-22 DIAGNOSIS — I25118 Atherosclerotic heart disease of native coronary artery with other forms of angina pectoris: Secondary | ICD-10-CM | POA: Diagnosis not present

## 2018-10-22 DIAGNOSIS — G4733 Obstructive sleep apnea (adult) (pediatric): Secondary | ICD-10-CM | POA: Diagnosis not present

## 2018-10-22 DIAGNOSIS — I208 Other forms of angina pectoris: Secondary | ICD-10-CM | POA: Diagnosis not present

## 2018-10-23 ENCOUNTER — Other Ambulatory Visit: Payer: Self-pay | Admitting: Internal Medicine

## 2018-10-23 DIAGNOSIS — R0602 Shortness of breath: Secondary | ICD-10-CM

## 2018-10-23 DIAGNOSIS — I208 Other forms of angina pectoris: Secondary | ICD-10-CM

## 2018-10-27 DIAGNOSIS — G4733 Obstructive sleep apnea (adult) (pediatric): Secondary | ICD-10-CM | POA: Diagnosis not present

## 2018-10-27 DIAGNOSIS — I272 Pulmonary hypertension, unspecified: Secondary | ICD-10-CM | POA: Diagnosis not present

## 2018-10-27 DIAGNOSIS — J449 Chronic obstructive pulmonary disease, unspecified: Secondary | ICD-10-CM | POA: Diagnosis not present

## 2018-11-02 ENCOUNTER — Other Ambulatory Visit: Payer: Self-pay | Admitting: Family Medicine

## 2018-11-02 NOTE — Telephone Encounter (Signed)
Requested Prescriptions  Pending Prescriptions Disp Refills  . gabapentin (NEURONTIN) 300 MG capsule [Pharmacy Med Name: GABAPENTIN 300MG  CAPSULES] 90 capsule 1    Sig: TAKE 1 CAPSULE(300 MG) BY MOUTH AT BEDTIME     Neurology: Anticonvulsants - gabapentin Passed - 11/02/2018  8:55 AM      Passed - Valid encounter within last 12 months    Recent Outpatient Visits          5 months ago Atrial fibrillation, unspecified type [facility])   [facility] [place], [place]   5 months ago Vaginal odor   [place], [place], [place]   6 months ago Atrial fibrillation, unspecified type [facility])   [place], [place], [place]   6 months ago Atrial fibrillation, unspecified type [facility])   [place], [place], [place]   7 months ago Atrial fibrillation, unspecified type [facility])   [facility] [place], [place]

## 2018-11-12 ENCOUNTER — Telehealth: Payer: Self-pay | Admitting: Family Medicine

## 2018-11-12 NOTE — Telephone Encounter (Signed)
She's overdue for f/u, was supposed to be seen back in May  Copied from [place]. Topic: General - Inquiry >> Nov 11, 2018  2:51 PM Richardo Priest, NT wrote: Reason for CRM: Patient called in stating she is wanting to know if PCP wanted her to stop taking any medications. States she is taking an over test and she is unsure of some questions. Call back is [phone].

## 2018-11-13 ENCOUNTER — Other Ambulatory Visit: Payer: Self-pay

## 2018-11-13 ENCOUNTER — Ambulatory Visit
Admission: RE | Admit: 2018-11-13 | Discharge: 2018-11-13 | Disposition: A | Discharge status: Home / Self Care | Payer: Medicare HMO | Source: Ambulatory Visit | Attending: Internal Medicine | Admitting: Internal Medicine

## 2018-11-13 DIAGNOSIS — I208 Other forms of angina pectoris: Secondary | ICD-10-CM | POA: Diagnosis present

## 2018-11-13 DIAGNOSIS — R0602 Shortness of breath: Secondary | ICD-10-CM | POA: Diagnosis present

## 2018-11-13 DIAGNOSIS — I200 Unstable angina: Secondary | ICD-10-CM | POA: Diagnosis not present

## 2018-11-13 NOTE — Telephone Encounter (Signed)
Called pt to set up fu she states that this question was for a stress test that she just had. She states that she has some other appointments and will call back to schedule.

## 2018-11-14 ENCOUNTER — Ambulatory Visit
Admission: RE | Admit: 2018-11-14 | Discharge: 2018-11-14 | Disposition: A | Discharge status: Home / Self Care | Payer: Medicare HMO | Source: Ambulatory Visit | Attending: Internal Medicine | Admitting: Internal Medicine

## 2018-11-14 LAB — NM MYOCAR MULTI W/SPECT W/WALL MOTION / EF
Estimated workload: 1 METS
Exercise duration (min): 1 min
Exercise duration (sec): 1 s
LV dias vol: 152 mL (ref 46–106)
LV sys vol: 58 mL
MPHR: 146 {beats}/min
Peak HR: 80 {beats}/min
Percent HR: 54 %
Rest HR: 58 {beats}/min
SDS: 0
SRS: 15
SSS: 11
TID: 1.13

## 2018-11-14 MED ORDER — TECHNETIUM TC 99M TETROFOSMIN IV KIT
33.0300 | PACK | Freq: Once | INTRAVENOUS | Status: AC | PRN
  Administered 2018-11-14: 09:00:00 33.03 via INTRAVENOUS

## 2018-11-14 MED ORDER — REGADENOSON 0.4 MG/5ML IV SOLN
0.4000 mg | Freq: Once | INTRAVENOUS | Status: AC
  Administered 2018-11-13: 10:00:00 0.4 mg via INTRAVENOUS

## 2018-11-14 MED ORDER — TECHNETIUM TC 99M TETROFOSMIN IV KIT: 30.0000 | PACK | Freq: Once | INTRAVENOUS | Status: DC | PRN

## 2018-11-14 MED ORDER — TECHNETIUM TC 99M TETROFOSMIN IV KIT
30.0000 | PACK | Freq: Once | INTRAVENOUS | Status: AC | PRN
  Administered 2018-11-13: 32.89 via INTRAVENOUS

## 2018-11-28 ENCOUNTER — Ambulatory Visit
Admission: RE | Admit: 2018-11-28 | Discharge: 2018-11-28 | Disposition: A | Discharge status: Home / Self Care | Payer: Medicare Other | Source: Ambulatory Visit | Attending: Family Medicine | Admitting: Family Medicine

## 2018-11-28 DIAGNOSIS — Z1231 Encounter for screening mammogram for malignant neoplasm of breast: Secondary | ICD-10-CM | POA: Diagnosis not present

## 2019-01-02 ENCOUNTER — Other Ambulatory Visit: Payer: Self-pay | Admitting: Family Medicine

## 2019-01-02 NOTE — Telephone Encounter (Signed)
Routing to provider  

## 2019-01-02 NOTE — Telephone Encounter (Signed)
Requested medication (s) are due for refill today: yes  Requested medication (s) are on the active medication list: yes  Last refill: 09/26/2018  Future visit scheduled:yes  Notes to clinic:  Review for refill   Requested Prescriptions  Pending Prescriptions Disp Refills   furosemide (LASIX) 20 MG tablet [Pharmacy Med Name: FUROSEMIDE 20MG  TABLETS] 180 tablet 0    Sig: TAKE 1 TABLET(20 MG) BY MOUTH TWICE DAILY AS NEEDED     Cardiovascular:  Diuretics - Loop Failed - 01/02/2019  1:20 PM      Failed - K in normal range and within 360 days    Potassium  Date Value Ref Range Status  12/12/2017 4.4 3.5 - 5.2 mmol/L Final         Failed - Ca in normal range and within 360 days    Calcium  Date Value Ref Range Status  12/12/2017 9.3 8.7 - 10.3 mg/dL Final         Failed - Na in normal range and within 360 days    Sodium  Date Value Ref Range Status  12/12/2017 143 134 - 144 mmol/L Final         Failed - Cr in normal range and within 360 days    Creatinine, Ser  Date Value Ref Range Status  12/12/2017 1.03 (H) 0.57 - 1.00 mg/dL Final         Failed - Valid encounter within last 6 months    Recent Outpatient Visits          7 months ago Atrial fibrillation, unspecified type [facility])   [facility] [place], [place]   7 months ago Vaginal odor   [facility] [place], [place]   8 months ago Atrial fibrillation, unspecified type [facility])   [facility] [place], [place]   8 months ago Atrial fibrillation, unspecified type [facility])   [place], [place], [place]   9 months ago Atrial fibrillation, unspecified type [facility])   [facility] [place], [place]      Future Appointments            In 3 days Orene Desanctis, [place], [place], [facility]           Passed - Last BP in normal range    BP Readings from Last 1 Encounters:  06/05/18 128/78          ezetimibe (ZETIA) 10 MG tablet [Pharmacy Med Name: EZETIMIBE 10MG  TABLETS] 90 tablet 1    Sig: TAKE 1 TABLET(10 MG) BY MOUTH DAILY     Cardiovascular:  Antilipid - Sterol Transport Inhibitors Failed - 01/02/2019  1:20 PM      Failed - Total Cholesterol in normal range and within 360 days    Cholesterol, Total  Date Value Ref Range Status  12/12/2017 153 100 - 199 mg/dL Final   Cholesterol Piccolo, Waived  Date Value Ref Range Status  09/05/2016 154 <200 mg/dL Final    Comment:                            Desirable                <200                         Borderline High      200- 239  High                     >239          Failed - LDL in normal range and within 360 days    LDL Calculated  Date Value Ref Range Status  12/12/2017 60 0 - 99 mg/dL Final         Failed - HDL in normal range and within 360 days    HDL  Date Value Ref Range Status  12/12/2017 82 >39 mg/dL Final         Failed - Triglycerides in normal range and within 360 days    Triglycerides  Date Value Ref Range Status  12/12/2017 56 0 - 149 mg/dL Final   Triglycerides Piccolo,Waived  Date Value Ref Range Status  09/05/2016 46 <150 mg/dL Final    Comment:                            Normal                   <150                         Borderline High     150 - 199                         High                200 - 499                         Very High                >499          Passed - Valid encounter within last 12 months    Recent Outpatient Visits          7 months ago Atrial fibrillation, unspecified type [facility])   [facility] [place], [place]   7 months ago Vaginal odor   [place], [place], [place]   8 months ago Atrial fibrillation, unspecified type [facility])   [place], [place], [place]   8 months ago Atrial fibrillation, unspecified type [facility])   [place], [place], [place]   9 months ago Atrial fibrillation, unspecified type [facility])   [facility] [place], [place]      Future Appointments            In 3 days Orene Desanctis, [place], [place], [facility]            atorvastatin (LIPITOR) 20 MG tablet [Pharmacy Med Name: ATORVASTATIN 20MG  TABLETS] 90 tablet 3    Sig: TAKE 1 TABLET(20 MG) BY MOUTH DAILY     Cardiovascular:  Antilipid - Statins Failed - 01/02/2019  1:20 PM      Failed - Total Cholesterol in normal range and within 360 days    Cholesterol, Total  Date Value Ref Range Status  12/12/2017 153 100 - 199 mg/dL Final   Cholesterol Piccolo, Waived  Date Value Ref Range Status  09/05/2016 154 <200 mg/dL Final    Comment:  Desirable                <200                         Borderline High      200- 239                         High                     >239          Failed - LDL in normal range and within 360 days    LDL Calculated  Date Value Ref Range Status  12/12/2017 60 0 - 99 mg/dL Final         Failed - HDL in normal range and within 360 days    HDL  Date Value Ref Range Status  12/12/2017 82 >39 mg/dL Final         Failed - Triglycerides in normal range and within 360 days    Triglycerides  Date Value Ref Range Status  12/12/2017 56 0 - 149 mg/dL Final   Triglycerides Piccolo,Waived  Date Value Ref Range Status  09/05/2016 46 <150 mg/dL Final    Comment:                            Normal                   <150                         Borderline High     150 - 199                         High                200 - 499                         Very High                >499          Passed - Patient is not pregnant      Passed - Valid encounter within last 12 months    Recent Outpatient Visits          7 months ago Atrial fibrillation, unspecified type [facility])   [facility] [place], [place]   7 months ago Vaginal  odor   [place], [place], [place]   8 months ago Atrial fibrillation, unspecified type [facility])   [place], [place], [place]   8 months ago Atrial fibrillation, unspecified type [facility])   [place], [place], [place]   9 months ago Atrial fibrillation, unspecified type [facility]   [facility] [place], [place]      Future Appointments            In 3 days Orene Desanctis, [place]ilia [place]ue, [place], [place]

## 2019-01-05 ENCOUNTER — Encounter: Payer: Self-pay | Admitting: Family Medicine

## 2019-01-05 ENCOUNTER — Other Ambulatory Visit: Payer: Self-pay

## 2019-01-05 ENCOUNTER — Ambulatory Visit (INDEPENDENT_AMBULATORY_CARE_PROVIDER_SITE_OTHER): Payer: Medicare Other | Admitting: Family Medicine

## 2019-01-05 VITALS — BP 97/67 | HR 52 | Temp 98.9°F | Ht 65.0 in | Wt 323.0 lb

## 2019-01-05 DIAGNOSIS — I4891 Unspecified atrial fibrillation: Secondary | ICD-10-CM

## 2019-01-05 DIAGNOSIS — I2510 Atherosclerotic heart disease of native coronary artery without angina pectoris: Secondary | ICD-10-CM | POA: Diagnosis not present

## 2019-01-05 DIAGNOSIS — E7800 Pure hypercholesterolemia, unspecified: Secondary | ICD-10-CM

## 2019-01-05 DIAGNOSIS — I10 Essential (primary) hypertension: Secondary | ICD-10-CM

## 2019-01-05 DIAGNOSIS — I509 Heart failure, unspecified: Secondary | ICD-10-CM

## 2019-01-05 DIAGNOSIS — G4733 Obstructive sleep apnea (adult) (pediatric): Secondary | ICD-10-CM

## 2019-01-05 LAB — COAGUCHEK XS/INR WAIVED
INR: 1.3 — ABNORMAL HIGH (ref 0.9–1.1)
Prothrombin Time: 15.1 s

## 2019-01-05 NOTE — Assessment & Plan Note (Signed)
BP low today but states it's good at home. Will continue home monitoring and current regimen. Call if becoming dizzy or having persistently abnormal readings

## 2019-01-05 NOTE — Assessment & Plan Note (Signed)
Stable and under good control on eliquis and labetalol. Continue current regimen. Labs today.

## 2019-01-05 NOTE — Assessment & Plan Note (Signed)
Recheck lipids, adjust as needed. Lifestyle modifications reviewed 

## 2019-01-05 NOTE — Assessment & Plan Note (Signed)
On CPAP nightly. Continue current regimen

## 2019-01-05 NOTE — Progress Notes (Addendum)
BP 97/67 (BP Location: Left Arm, Patient Position: Sitting, Cuff Size: Normal)   Pulse (!) 52   Temp 98.9 F (37.2 C) (Oral)   Ht 5\' 5"  (1.651 m)   Wt (!) 323 lb (146.5 kg)   SpO2 98%   BMI 53.75 kg/m    Subjective:    Patient ID: Michelle Jenkins, female    DOB: 14-Dec-1945, 73 y.o.   MRN: [id number]  HPI: Michelle Jenkins is a 73 y.o. female  Chief Complaint  Patient presents with  . Atrial Fibrillation  . Needs Tooth Extraction     Patient needs tooth pulled, but needs to know how to take her AFIB medication to do so.    Patient here today for overdue 6 month f/u. Appts were cancelled due to her fear of COVID 19. No new concerns today other than she has a painful tooth she thinks will need to be pulled soon and was wanting instruction about what to do with her eliquis in the event she would need a procedure.   Recently established with Cardiology and undergoing stress test due to her ongoing CP/SOB issues. On eliquis for atrial fibrillation and also followed for hx of CVA, MI, CHF, CAD. Followed by Pulmonology and on continuous O2 for pulmonary HTN , OSA on CPAP, and severe SOB. No bleeding or bruising issues, worsening CP or SOB, syncope, falls, dizziness.   OSA - on CPAP nightly which seems to be doing well for her.   Relevant past medical, surgical, family and social history reviewed and updated as indicated. Interim medical history since our last visit reviewed. Allergies and medications reviewed and updated.  Review of Systems  Per HPI unless specifically indicated above     Objective:    BP 97/67 (BP Location: Left Arm, Patient Position: Sitting, Cuff Size: Normal)   Pulse (!) 52   Temp 98.9 F (37.2 C) (Oral)   Ht 5\' 5"  (1.651 m)   Wt (!) 323 lb (146.5 kg)   SpO2 98%   BMI 53.75 kg/m   Wt Readings from Last 3 Encounters:  01/05/19 (!) 323 lb (146.5 kg)  06/05/18 (!) 327 lb (148.3 kg)  05/16/18 (!) 326 lb 1.6 oz (147.9 kg)    Physical Exam Vitals signs  and nursing note reviewed.  Constitutional:      Appearance: Normal appearance. She is not ill-appearing.  HENT:     Head: Atraumatic.  Eyes:     Extraocular Movements: Extraocular movements intact.     Conjunctiva/sclera: Conjunctivae normal.  Neck:     Musculoskeletal: Normal range of motion and neck supple.  Cardiovascular:     Rate and Rhythm: Normal rate and regular rhythm.     Heart sounds: Normal heart sounds.  Pulmonary:     Effort: Pulmonary effort is normal. No respiratory distress.     Comments: On continuous supplemental O2 via nasal cannula Musculoskeletal:     Comments: ROM at baseline  Skin:    General: Skin is warm and dry.  Neurological:     Mental Status: She is alert and oriented to person, place, and time.  Psychiatric:        Mood and Affect: Mood normal.        Thought Content: Thought content normal.        Judgment: Judgment normal.     Results for orders placed or performed during the hospital encounter of 11/13/18  NM Myocar Multi W/Spect W/Wall Motion / EF  Result Value  Ref Range   Rest HR 58 bpm   Rest BP 98/59 mmHg   Exercise duration (sec) 1 sec   Percent HR 54 %   Exercise duration (min) 1 min   Estimated workload 1.0 METS   Peak HR 80 bpm   Peak BP 100/52 mmHg   MPHR 146 bpm   SSS 11    SRS 15    SDS 0    TID 1.13    LV sys vol 58 mL   LV dias vol 152 46 - 106 mL      Assessment & Plan:   Problem List Items Addressed This Visit      Cardiovascular and Mediastinum   Hypertension    BP low today but states it's good at home. Will continue home monitoring and current regimen. Call if becoming dizzy or having persistently abnormal readings      Relevant Orders   CBC with Differential/Platelet   Comprehensive metabolic panel   Atrial fibrillation ([facility]) - Primary    Stable and under good control on eliquis and labetalol. Continue current regimen. Labs today.       Relevant Orders   CoaguChek XS/INR Waived   CHF (congestive  heart failure) ([place])    Followed by Cardiology, stable. Continue current regimen per their recommendations        Respiratory   Obstructive apnea    On CPAP nightly. Continue current regimen        Other   CAD in native artery    Check lipids, adjust as needed. Lifestyle modifications reviewed      Relevant Orders   Lipid Panel w/o Chol/HDL Ratio   Hypercholesteremia    Recheck lipids, adjust as needed. Lifestyle modifications reviewed      Relevant Orders   Lipid Panel w/o Chol/HDL Ratio       Follow up plan: Return in about 6 months (around 07/05/2019) for CPE.

## 2019-01-05 NOTE — Assessment & Plan Note (Signed)
Followed by Cardiology, stable. Continue current regimen per their recommendations

## 2019-01-05 NOTE — Assessment & Plan Note (Signed)
Check lipids, adjust as needed. Lifestyle modifications reviewed

## 2019-01-06 LAB — CBC WITH DIFFERENTIAL/PLATELET
Basophils Absolute: 0 10*3/uL (ref 0.0–0.2)
Basos: 1 %
EOS (ABSOLUTE): 0.2 10*3/uL (ref 0.0–0.4)
Eos: 3 %
Hematocrit: 32.9 % — ABNORMAL LOW (ref 34.0–46.6)
Hemoglobin: 10.9 g/dL — ABNORMAL LOW (ref 11.1–15.9)
Immature Grans (Abs): 0 10*3/uL (ref 0.0–0.1)
Immature Granulocytes: 0 %
Lymphocytes Absolute: 1.5 10*3/uL (ref 0.7–3.1)
Lymphs: 32 %
MCH: 32 pg (ref 26.6–33.0)
MCHC: 33.1 g/dL (ref 31.5–35.7)
MCV: 97 fL (ref 79–97)
Monocytes Absolute: 0.6 10*3/uL (ref 0.1–0.9)
Monocytes: 12 %
Neutrophils Absolute: 2.5 10*3/uL (ref 1.4–7.0)
Neutrophils: 52 %
Platelets: 165 10*3/uL (ref 150–450)
RBC: 3.41 x10E6/uL — ABNORMAL LOW (ref 3.77–5.28)
RDW: 13.2 % (ref 11.7–15.4)
WBC: 4.8 10*3/uL (ref 3.4–10.8)

## 2019-01-06 LAB — LIPID PANEL W/O CHOL/HDL RATIO
Cholesterol, Total: 155 mg/dL (ref 100–199)
HDL: 80 mg/dL (ref >39)
LDL Chol Calc (NIH): 64 mg/dL (ref 0–99)
Triglycerides: 55 mg/dL (ref 0–149)
VLDL Cholesterol Cal: 11 mg/dL (ref 5–40)

## 2019-01-06 LAB — COMPREHENSIVE METABOLIC PANEL
ALT: 12 IU/L (ref 0–32)
AST: 18 IU/L (ref 0–40)
Albumin/Globulin Ratio: 1.6 (ref 1.2–2.2)
Albumin: 4.2 g/dL (ref 3.7–4.7)
Alkaline Phosphatase: 79 IU/L (ref 39–117)
BUN/Creatinine Ratio: 17 (ref 12–28)
BUN: 18 mg/dL (ref 8–27)
Bilirubin Total: 0.6 mg/dL (ref 0.0–1.2)
CO2: 28 mmol/L (ref 20–29)
Calcium: 9.4 mg/dL (ref 8.7–10.3)
Chloride: 102 mmol/L (ref 96–106)
Creatinine, Ser: 1.08 mg/dL — ABNORMAL HIGH (ref 0.57–1.00)
GFR calc Af Amer: 58 mL/min/{1.73_m2} — ABNORMAL LOW (ref >59)
GFR calc non Af Amer: 51 mL/min/{1.73_m2} — ABNORMAL LOW (ref >59)
Globulin, Total: 2.6 g/dL (ref 1.5–4.5)
Glucose: 100 mg/dL — ABNORMAL HIGH (ref 65–99)
Potassium: 4 mmol/L (ref 3.5–5.2)
Sodium: 143 mmol/L (ref 134–144)
Total Protein: 6.8 g/dL (ref 6.0–8.5)

## 2019-01-07 ENCOUNTER — Encounter: Payer: Self-pay | Admitting: Family Medicine

## 2019-01-12 ENCOUNTER — Telehealth: Payer: Self-pay

## 2019-01-12 NOTE — Telephone Encounter (Signed)
Called to schedule medicare annual wellness visit with NHA- Tiffany Hill,LPN at [facility]. Can schedule virtual or in office visit anytime after 01/18/2019.  Direct call back: [phone]

## 2019-01-14 ENCOUNTER — Other Ambulatory Visit: Payer: Self-pay | Admitting: Family Medicine

## 2019-01-26 ENCOUNTER — Telehealth: Payer: Self-pay

## 2019-01-26 NOTE — Telephone Encounter (Signed)
Patient dropped off a letter from [facility] stating that Letairis tabs are not on the patient's insurance plan drug list. Patient has been supplied a 90 day RX according to the letter. Attempted to do PA, stated that the generic of this medication is available. Will submit refill request for the generic version to see if insurance approves the medication.

## 2019-02-12 ENCOUNTER — Ambulatory Visit (INDEPENDENT_AMBULATORY_CARE_PROVIDER_SITE_OTHER): Payer: Medicare Other

## 2019-02-12 VITALS — Resp 16 | Ht 65.0 in | Wt 324.0 lb

## 2019-02-12 DIAGNOSIS — Z0000 Encounter for general adult medical examination without abnormal findings: Secondary | ICD-10-CM

## 2019-02-12 NOTE — Progress Notes (Signed)
Subjective:   Michelle Jenkins is a 73 y.o. female who presents for Medicare Annual (Subsequent) preventive examination.  This visit is being conducted via phone call  - after an attempt to do on video chat - due to the COVID-19 pandemic. This patient has given me verbal consent via phone to conduct this visit, patient states they are participating from their home address. Some vital signs may be absent or patient reported.   Patient identification: identified by name, DOB, and current address.    Review of Systems:   Cardiac Risk Factors include: hypertension;advanced age (>75men, >65 women);dyslipidemia     Objective:     Vitals: Resp 16   Ht 5\' 5"  (1.651 m)   Wt (!) 324 lb (147 kg)   BMI 53.92 kg/m   Body mass index is 53.92 kg/m.  Advanced Directives 02/12/2019 01/16/2018 12/13/2016 02/20/2016 12/07/2014  Does Patient Have a Medical Advance Directive? No No No No No  Would patient like information on creating a medical advance directive? - No - Patient declined Yes (MAU/Ambulatory/Procedural Areas - Information given) No - patient declined information No - patient declined information    Tobacco Social History   Tobacco Use  Smoking Status Former Smoker  . Packs/day: 0.25  . Types: Cigarettes  . Quit date: 06/15/2014  . Years since quitting: 4.6  Smokeless Tobacco Never Used  Tobacco Comment   4-5 cigarettes a day when she smoked     Counseling given: Not Answered Comment: 4-5 cigarettes a day when she smoked   Clinical Intake:  Pre-visit preparation completed: Yes  Pain : No/denies pain     Nutritional Status: BMI > 30  Obese Nutritional Risks: None Diabetes: No  How often do you need to have someone help you when you read instructions, pamphlets, or other written materials from your doctor or pharmacy?: 1 - Never  Interpreter Needed?: No  Information entered by :: Miyana Mordecai,LPN  Past Medical History:  Diagnosis Date  . Anginal pain ([place])   .  Asthma   . Bronchitis   . CAD (coronary artery disease)   . CHF (congestive heart failure) ([place])   . CVA (cerebral infarction)   . Dysrhythmia   . GERD (gastroesophageal reflux disease)   . History of chronic atrial fibrillation   . Hypercholesteremia   . Hypertension   . Idiopathic pulmonary hypertension ([place])   . Myocardial infarction ([place])   . Obesity   . OSA (obstructive sleep apnea)   . Pre-diabetes   . Pulmonary artery hypertension ([place])   . Shortness of breath dyspnea   . Sleep apnea   . Stroke [facility])    Past Surgical History:  Procedure Laterality Date  . BREAST BIOPSY Right 11/15/2015   stereo, COMPLEX SCLEROSING LESION WITH INTRADUCTAL PAPILLOMA COMPONENT AND   . BREAST EXCISIONAL BIOPSY Right 02/27/2016   NEG  . BREAST LUMPECTOMY WITH NEEDLE LOCALIZATION Right 02/27/2016   Procedure: BREAST LUMPECTOMY WITH NEEDLE LOCALIZATION;  Surgeon: Christene Lye, MD;  Location: [facility] ORS;  Service: General;  Laterality: Right;  . CARDIAC CATHETERIZATION    . CARDIAC CATHETERIZATION Right 12/07/2014   Procedure: Right and Left Heart Cath with Coronary Angiography;  Surgeon: Teodoro Spray, MD;  Location: [place] [facility];  Service: Cardiovascular;  Laterality: Right;  . [place]  and 1999  . COLONOSCOPY  10/31/1998  . DILATION AND CURETTAGE OF UTERUS    . FOOT SURGERY Right   . HYSTEROSCOPY    .  JOINT REPLACEMENT Left   . KNEE ARTHROSCOPY AND ARTHROTOMY Right   . lt knee replacement    . TONSILLECTOMY    . UVULOPALATOPHARYNGOPLASTY     Family History  Problem Relation Age of Onset  . Diabetes Mother   . Alzheimer's disease Mother   . Throat cancer Brother   . Diabetes Brother   . Cancer Brother        lung  . Hypertension Brother   . Cancer Daughter 83       metastatic uterine PECOMA to the liver and lungs  . Rheumatic fever Daughter   . Diabetes Sister   . Hypertension Sister   . COPD Neg Hx   . Heart disease Neg Hx   . Stroke Neg  Hx   . Breast cancer Neg Hx    Social History   Socioeconomic History  . Marital status: Single    Spouse name: Not on file  . Number of children: Not on file  . Years of education: Not on file  . Highest education level: 8th grade  Occupational History  . Occupation: retired  Scientific laboratory technician  . Financial resource strain: Not hard at all  . Food insecurity    Worry: Never true    Inability: Never true  . Transportation needs    Medical: No    Non-medical: No  Tobacco Use  . Smoking status: Former Smoker    Packs/day: 0.25    Types: Cigarettes    Quit date: 06/15/2014    Years since quitting: 4.6  . Smokeless tobacco: Never Used  . Tobacco comment: 4-5 cigarettes a day when she smoked  Substance and Sexual Activity  . Alcohol use: Yes    Alcohol/week: 0.0 - 1.0 standard drinks    Comment: occasionally  . Drug use: No  . Sexual activity: Not on file  Lifestyle  . Physical activity    Days per week: 0 days    Minutes per session: 0 min  . Stress: Not at all  Relationships  . Social connections    Talks on phone: More than three times a week    Gets together: More than three times a week    Attends religious service: More than 4 times per year    Active member of club or organization: No    Attends meetings of clubs or organizations: Never    Relationship status: Widowed  Other Topics Concern  . Not on file  Social History Narrative  . Not on file    Outpatient Encounter Medications as of 02/12/2019  Medication Sig  . ambrisentan (LETAIRIS) 10 MG tablet Take 10 mg by mouth daily.  Marland Kitchen atorvastatin (LIPITOR) 20 MG tablet TAKE 1 TABLET(20 MG) BY MOUTH DAILY  . ELIQUIS 5 MG TABS tablet TAKE 1 TABLET(5 MG) BY MOUTH TWICE DAILY  . ezetimibe (ZETIA) 10 MG tablet TAKE 1 TABLET(10 MG) BY MOUTH DAILY  . furosemide (LASIX) 20 MG tablet TAKE 1 TABLET(20 MG) BY MOUTH TWICE DAILY AS NEEDED  . gabapentin (NEURONTIN) 300 MG capsule TAKE 1 CAPSULE(300 MG) BY MOUTH AT BEDTIME  .  labetalol (NORMODYNE) 200 MG tablet TAKE 1 TABLET(200 MG) BY MOUTH TWICE DAILY  . olmesartan-hydrochlorothiazide (BENICAR HCT) 20-12.5 MG tablet TAKE 1 TABLET BY MOUTH DAILY  . Omega-3 Fatty Acids (FISH OIL PO) Take 1 Dose by mouth daily.  . pantoprazole (PROTONIX) 40 MG tablet TAKE 1 TABLET(40 MG) BY MOUTH DAILY  . Psyllium (METAMUCIL FIBER PO) Take 1 Dose by mouth.  Patient uses Metamucil Powder in the AM  . simvastatin (ZOCOR) 40 MG tablet Take 40 mg by mouth daily at 6 PM.   . sucralfate (CARAFATE) 1 g tablet TAKE 1 TABLET BY MOUTH FOUR TIMES DAILY WITH MEALS AND AT BEDTIME. CAN DISSOLVE TABLET IN WATER IF DESIRED  . VENTOLIN HFA 108 (90 Base) MCG/ACT inhaler    Facility-Administered Encounter Medications as of 02/12/2019  Medication  . technetium tetrofosmin (TC-MYOVIEW) injection 30 millicurie    Activities of Daily Living In your present state of health, do you have any difficulty performing the following activities: 02/12/2019  Hearing? N  Comment no hearing aids  Vision? Y  Comment cant see out of right eye, goes to [facility]  Difficulty concentrating or making decisions? N  Walking or climbing stairs? Y  Comment sob  Dressing or bathing? N  Doing errands, shopping? N  Preparing Food and eating ? N  Using the Toilet? N  In the past six months, have you accidently leaked urine? Y  Comment wears pads for protection  Do you have problems with loss of bowel control? N  Managing your Medications? N  Managing your Finances? N  Housekeeping or managing your Housekeeping? N  Some recent data might be hidden    Patient Care Team: Volney American, PA-C as PCP - General (Family Medicine) Albina Billet, MD (Internal Medicine) Christene Lye, MD (General Surgery)    Assessment:   This is a routine wellness examination for Carollyn.  Exercise Activities and Dietary recommendations Current Exercise Habits: The patient does not participate in regular  exercise at present, Exercise limited by: None identified  Goals    . DIET - INCREASE WATER INTAKE     Recommend drinking at least 6-8 glasses of water a day        Fall Risk: Fall Risk  01/16/2018 09/06/2017 03/20/2017 12/13/2016 09/05/2016  Falls in the past year? No No No No No    FALL RISK PREVENTION PERTAINING TO THE HOME:  Any stairs in or around the home? Yes  If so, are there any without handrails? No   Home free of loose throw rugs in walkways, pet beds, electrical cords, etc? Yes  Adequate lighting in your home to reduce risk of falls? Yes   ASSISTIVE DEVICES UTILIZED TO PREVENT FALLS:  Life alert? Yes  Use of a cane, walker or w/c? Yes , walker at night, cane as needed  Grab bars in the bathroom? Yes  Shower chair or bench in shower? No  Elevated toilet seat or a handicapped toilet? No   DME ORDERS:  DME order needed?  No   TIMED UP AND GO:     Depression Screen PHQ 2/9 Scores 02/12/2019 01/16/2018 12/12/2017 12/13/2016  PHQ - 2 Score 0 4 0 2  PHQ- 9 Score - 10 10 2      Cognitive Function     6CIT Screen 01/16/2018 12/13/2016  What Year? 0 points 0 points  What month? 0 points 0 points  What time? 0 points 0 points  Count back from 20 0 points 0 points  Months in reverse 0 points 0 points  Repeat phrase 2 points 4 points  Total Score 2 4    Immunization History  Administered Date(s) Administered  . Influenza, High Dose Seasonal PF 12/31/2017, 12/31/2017, 12/11/2018  . Pneumococcal Conjugate-13 01/16/2018  . Pneumococcal Polysaccharide-23 12/13/2016  . Td 05/11/1999  . Zoster Recombinat (Shingrix) 11/01/2017, 12/31/2017    Qualifies for  Shingles Vaccine? Shingrix completed   Tdap: Discussed need for TD/TDAP vaccine, patient verbalized understanding that this is not covered as a preventative with there insurance and to call the office if she develops any new skin injuries, ie: cuts, scrapes, bug bites, or open wounds.  Flu Vaccine: up to date    Pneumococcal Vaccine: up to date  Screening Tests Health Maintenance  Topic Date Due  . TETANUS/TDAP  02/12/2020 (Originally 05/10/2009)  . MAMMOGRAM  11/27/2020  . Fecal DNA (Cologuard)  05/28/2021  . INFLUENZA VACCINE  Completed  . DEXA SCAN  Completed  . Hepatitis C Screening  Completed  . PNA vac Low Risk Adult  Completed    Cancer Screenings:  Colorectal Screening: Completed cologuard 05/28/2018 . Repeat every 3 years  Mammogram: Completed 11/28/2018  Bone Density: up to date   Lung Cancer Screening: (Low Dose CT Chest recommended if Age 99-80 years, 30 pack-year currently smoking OR have quit w/in 15years.) does not qualify.    Additional Screening:  Hepatitis C Screening: does qualify; Completed 12/13/2016  Vision Screening: Recommended annual ophthalmology exams for early detection of glaucoma and other disorders of the eye. Is the patient up to date with their annual eye exam?  Yes  Who is the provider or what is the name of the office in which the pt attends annual eye exams? [facility]    Dental Screening: Recommended annual dental exams for proper oral hygiene  Community Resource Referral:  CRR required this visit?  no      Plan:  I have personally reviewed and addressed the Medicare Annual Wellness questionnaire and have noted the following in the patient's chart:  A. Medical and social history B. Use of alcohol, tobacco or illicit drugs  C. Current medications and supplements D. Functional ability and status E.  Nutritional status F.  Physical activity G. Advance directives H. List of other physicians I.  Hospitalizations, surgeries, and ER visits in previous 12 months J.  [place] such as hearing and vision if needed, cognitive and depression L. Referrals and appointments   In addition, I have reviewed and discussed with patient certain preventive protocols, quality metrics, and best practice recommendations. A written  personalized care plan for preventive services as well as general preventive health recommendations were provided to patient.  Signed,    Bevelyn Ngo, LPN  [id number] Nurse Health Advisor   Nurse Notes:

## 2019-02-12 NOTE — Patient Instructions (Signed)
Michelle Jenkins , Thank you for taking time to come for your Medicare Wellness Visit. I appreciate your ongoing commitment to your health goals. Please review the following plan we discussed and let me know if I can assist you in the future.   Screening recommendations/referrals: Colonoscopy: cologuard completed 05/28/2018 Mammogram: completed 8/22020 Bone Density: up to date Recommended yearly ophthalmology/optometry visit for glaucoma screening and checkup Recommended yearly dental visit for hygiene and checkup  Vaccinations: Influenza vaccine: up to date Pneumococcal vaccine: up to date Tdap vaccine: due now Shingles vaccine: up to date     Advanced directives: Advance directive discussed with you today. Once this is complete please bring a copy in to our office so we can scan it into your chart.   Next appointment: Follow up in one year for your annual wellness visit    Preventive Care 65 Years and Older, Female Preventive care refers to lifestyle choices and visits with your health care provider that can promote health and wellness. What does preventive care include?  A yearly physical exam. This is also called an annual well check.  Dental exams once or twice a year.  Routine eye exams. Ask your health care provider how often you should have your eyes checked.  Personal lifestyle choices, including:  Daily care of your teeth and gums.  Regular physical activity.  Eating a healthy diet.  Avoiding tobacco and drug use.  Limiting alcohol use.  Practicing safe sex.  Taking low-dose aspirin every day.  Taking vitamin and mineral supplements as recommended by your health care provider. What happens during an annual well check? The services and screenings done by your health care provider during your annual well check will depend on your age, overall health, lifestyle risk factors, and family history of disease. Counseling  Your health care provider may ask you questions  about your:  Alcohol use.  Tobacco use.  Drug use.  Emotional well-being.  Home and relationship well-being.  Sexual activity.  Eating habits.  History of falls.  Memory and ability to understand (cognition).  Work and work Statistician.  Reproductive health. Screening  You may have the following tests or measurements:  Height, weight, and BMI.  Blood pressure.  Lipid and cholesterol levels. These may be checked every 5 years, or more frequently if you are over 48 years old.  Skin check.  Lung cancer screening. You may have this screening every year starting at age 56 if you have a 30-pack-year history of smoking and currently smoke or have quit within the past 15 years.  Fecal occult blood test (FOBT) of the stool. You may have this test every year starting at age 69.  Flexible sigmoidoscopy or colonoscopy. You may have a sigmoidoscopy every 5 years or a colonoscopy every 10 years starting at age 7.  Hepatitis C blood test.  Hepatitis B blood test.  Sexually transmitted disease (STD) testing.  Diabetes screening. This is done by checking your blood sugar (glucose) after you have not eaten for a while (fasting). You may have this done every 1-3 years.  Bone density scan. This is done to screen for osteoporosis. You may have this done starting at age 83.  Mammogram. This may be done every 1-2 years. Talk to your health care provider about how often you should have regular mammograms. Talk with your health care provider about your test results, treatment options, and if necessary, the need for more tests. Vaccines  Your health care provider may recommend certain vaccines,  such as:  Influenza vaccine. This is recommended every year.  Tetanus, diphtheria, and acellular pertussis (Tdap, Td) vaccine. You may need a Td booster every 10 years.  Zoster vaccine. You may need this after age 66.  Pneumococcal 13-valent conjugate (PCV13) vaccine. One dose is  recommended after age 28.  Pneumococcal polysaccharide (PPSV23) vaccine. One dose is recommended after age 72. Talk to your health care provider about which screenings and vaccines you need and how often you need them. This information is not intended to replace advice given to you by your health care provider. Make sure you discuss any questions you have with your health care provider. Document Released: 04/29/2015 Document Revised: 12/21/2015 Document Reviewed: 02/01/2015 Elsevier Interactive Patient Education  2017 [place] Prevention in the Home Falls can cause injuries. They can happen to people of all ages. There are many things you can do to make your home safe and to help prevent falls. What can I do on the outside of my home?  Regularly fix the edges of walkways and driveways and fix any cracks.  Remove anything that might make you trip as you walk through a door, such as a raised step or threshold.  Trim any bushes or trees on the path to your home.  Use bright outdoor lighting.  Clear any walking paths of anything that might make someone trip, such as rocks or tools.  Regularly check to see if handrails are loose or broken. Make sure that both sides of any steps have handrails.  Any raised decks and porches should have guardrails on the edges.  Have any leaves, snow, or ice cleared regularly.  Use sand or salt on walking paths during winter.  Clean up any spills in your garage right away. This includes oil or grease spills. What can I do in the bathroom?  Use night lights.  Install grab bars by the toilet and in the tub and shower. Do not use towel bars as grab bars.  Use non-skid mats or decals in the tub or shower.  If you need to sit down in the shower, use a plastic, non-slip stool.  Keep the floor dry. Clean up any water that spills on the floor as soon as it happens.  Remove soap buildup in the tub or shower regularly.  Attach bath mats  securely with double-sided non-slip rug tape.  Do not have throw rugs and other things on the floor that can make you trip. What can I do in the bedroom?  Use night lights.  Make sure that you have a light by your bed that is easy to reach.  Do not use any sheets or blankets that are too big for your bed. They should not hang down onto the floor.  Have a firm chair that has side arms. You can use this for support while you get dressed.  Do not have throw rugs and other things on the floor that can make you trip. What can I do in the kitchen?  Clean up any spills right away.  Avoid walking on wet floors.  Keep items that you use a lot in easy-to-reach places.  If you need to reach something above you, use a strong step stool that has a grab bar.  Keep electrical cords out of the way.  Do not use floor polish or wax that makes floors slippery. If you must use wax, use non-skid floor wax.  Do not have throw rugs and other things on  the floor that can make you trip. What can I do with my stairs?  Do not leave any items on the stairs.  Make sure that there are handrails on both sides of the stairs and use them. Fix handrails that are broken or loose. Make sure that handrails are as long as the stairways.  Check any carpeting to make sure that it is firmly attached to the stairs. Fix any carpet that is loose or worn.  Avoid having throw rugs at the top or bottom of the stairs. If you do have throw rugs, attach them to the floor with carpet tape.  Make sure that you have a light switch at the top of the stairs and the bottom of the stairs. If you do not have them, ask someone to add them for you. What else can I do to help prevent falls?  Wear shoes that:  Do not have high heels.  Have rubber bottoms.  Are comfortable and fit you well.  Are closed at the toe. Do not wear sandals.  If you use a stepladder:  Make sure that it is fully opened. Do not climb a closed  stepladder.  Make sure that both sides of the stepladder are locked into place.  Ask someone to hold it for you, if possible.  Clearly mark and make sure that you can see:  Any grab bars or handrails.  First and last steps.  Where the edge of each step is.  Use tools that help you move around (mobility aids) if they are needed. These include:  Canes.  Walkers.  Scooters.  Crutches.  Turn on the lights when you go into a dark area. Replace any light bulbs as soon as they burn out.  Set up your furniture so you have a clear path. Avoid moving your furniture around.  If any of your floors are uneven, fix them.  If there are any pets around you, be aware of where they are.  Review your medicines with your doctor. Some medicines can make you feel dizzy. This can increase your chance of falling. Ask your doctor what other things that you can do to help prevent falls. This information is not intended to replace advice given to you by your health care provider. Make sure you discuss any questions you have with your health care provider. Document Released: 01/27/2009 Document Revised: 09/08/2015 Document Reviewed: 05/07/2014 Elsevier Interactive Patient Education  2017 [employer].

## 2019-03-16 ENCOUNTER — Telehealth: Payer: Self-pay

## 2019-03-16 NOTE — Telephone Encounter (Signed)
Pharmacy will not cover brand RX, Can we send in generic version please?

## 2019-03-16 NOTE — Addendum Note (Signed)
Addended by: Georgina Peer on: 03/16/2019 04:40 PM   Modules accepted: Orders

## 2019-03-16 NOTE — Telephone Encounter (Signed)
Pt is still waiting to hear about this.  States pharmacy will not fill without knowing what to do. Pt can be reached at [phone]

## 2019-03-16 NOTE — Telephone Encounter (Signed)
Called and let patient know what Apolonio Schneiders said. Patient states that the pulmonary office called and told her that as well. States she will be getting the medication on Wednesday.

## 2019-03-16 NOTE — Telephone Encounter (Signed)
Routing to provider  \Copied from [place] [phone]. Topic: General - Other >> Mar 16, 2019 11:46 AM Lennox Solders wrote: Reason for CRM: pt is calling and said Michelle Jenkins has the letter to send to dr Raul Del concerning letairis  10 mg. Pt would like to Michelle Jenkins to follow up on the medication

## 2019-03-16 NOTE — Telephone Encounter (Signed)
This is not written by [place], this is from her Pulmonologist

## 2019-03-17 NOTE — Telephone Encounter (Signed)
I spoke with the patient after this encounter was started yesterday afternoon, see other encounter. Patient does not need anything else from [place] as she stated she would be getting the medication on Wednesday.

## 2019-03-17 NOTE — Telephone Encounter (Signed)
Does anyone know what letter she's talking about? This shouldn't involve our office at all, she should be reaching out to Dr. Raul Del about whatever is going on with this medicine. Please fax over to his office if there's a form he will need.

## 2019-04-07 ENCOUNTER — Other Ambulatory Visit: Payer: Self-pay

## 2019-04-07 ENCOUNTER — Ambulatory Visit (INDEPENDENT_AMBULATORY_CARE_PROVIDER_SITE_OTHER): Payer: Medicare Other | Admitting: Family Medicine

## 2019-04-07 ENCOUNTER — Encounter: Payer: Self-pay | Admitting: Family Medicine

## 2019-04-07 VITALS — Ht 65.0 in

## 2019-04-07 DIAGNOSIS — M791 Myalgia, unspecified site: Secondary | ICD-10-CM

## 2019-04-07 MED ORDER — GABAPENTIN 300 MG PO CAPS: 600.0000 mg | ORAL_CAPSULE | Freq: Every evening | ORAL | 0 refills | Status: DC | PRN

## 2019-04-07 NOTE — Progress Notes (Signed)
   Ht 5\' 5"  (1.651 m)   BMI 53.92 kg/m    Subjective:    Patient ID: Michelle Jenkins, female    DOB: 07-Jun-1945, 73 y.o.   MRN: [id number]  HPI: Michelle Jenkins is a 73 y.o. female  Chief Complaint  Patient presents with  . Generalized Body Aches    on and off x 2 months     . This visit was completed via telephone due to the restrictions of the COVID-19 pandemic. All issues as above were discussed and addressed. Physical exam was done as above through visual confirmation on telephone. If it was felt that the patient should be evaluated in the office, they were directed there. The patient verbally consented to this visit. . Location of the patient: home . Location of the provider: home . Those involved with this call:  . Provider: Merrie Roof, PA-C . CMA: Lesle Chris, [place] . Front Desk/Registration: Jill Side  . Time spent on call: 15 minutes with patient face to face via video conference. More than 50% of this time was spent in counseling and coordination of care. 5 minutes total spent in review of patient's record and preparation of their chart. I verified patient identity using two factors (patient name and date of birth). Patient consents verbally to being seen via telemedicine visit today.   Body is sore all over upon waking in the morning after laying down all night. Seems to work itself out after getting up and moving in the morning. This has been ongoing the past few months. Also notes ankles have been swelling some during this time. Takes gabapentin at bedtime for neuropathy but this does not seem to help much. Denies medication changes, activity changes, hydration status changes. Denies fever, chills, sweats, CP, syncope. Has not tried anything for relief.   Relevant past medical, surgical, family and social history reviewed and updated as indicated. Interim medical history since our last visit reviewed. Allergies and medications reviewed and updated.  Review of  Systems  Per HPI unless specifically indicated above     Objective:    Ht 5\' 5"  (1.651 m)   BMI 53.92 kg/m   Wt Readings from Last 3 Encounters:  02/12/19 (!) 324 lb (147 kg)  01/05/19 (!) 323 lb (146.5 kg)  06/05/18 (!) 327 lb (148.3 kg)    Physical Exam  Unable to perform PE due to patient lack of access to video technology for today's visit.   Results for orders placed or performed in visit on 02/12/19  Cologuard  Result Value Ref Range   Cologuard Negative Negative      Assessment & Plan:   Problem List Items Addressed This Visit    None    Visit Diagnoses    Myalgia    -  Primary   WIll hold statin for several weeks to r/o causation, and increase gabapentin to 2 capsules at bedtime. Increase physical activity, compression stockings       Follow up plan: Return in about 4 weeks (around 05/05/2019) for body ache f/u.

## 2019-05-20 ENCOUNTER — Ambulatory Visit: Payer: Self-pay | Admitting: Family Medicine

## 2019-05-20 NOTE — Telephone Encounter (Signed)
Pt stated her son and his wife visited on Saturday and left  They went home and the son was not feeling that well and went to get tested. Wife started feeling bad and she got tested also. Both tested positive. She said that when they were there, they all wore masks. But also having drinks.  She did not clean after them but she did used Lysol spray. Pt states she has a hx of COPD. She is not having symptoms. Advised that she go get tested also. Appointment scheduled for next Wednesday per her request. Routing to [facility] for review.  Reason for Disposition . General information question, no triage required and triager able to answer question  Answer Assessment - Initial Assessment Questions 1. REASON FOR CALL or QUESTION: "What is your reason for calling today?" or "How can I best help you?" or "What question do you have that I can help answer?"     Advice on whether to go get tested for coivd 19.  Protocols used: INFORMATION ONLY CALL-A-AH

## 2019-05-20 NOTE — Telephone Encounter (Signed)
I don't see that an appt next week is scheduled as stated in msg. If she would like [place] to see her and order testing happy to do so

## 2019-05-21 NOTE — Telephone Encounter (Signed)
Pt stated she is waiting to hear back about covid results, nurse came to test her at her home, Pt stated she would call to make apt once she knew her results.

## 2019-05-26 ENCOUNTER — Other Ambulatory Visit: Payer: Medicare Other

## 2019-05-27 ENCOUNTER — Other Ambulatory Visit: Payer: Self-pay

## 2019-05-27 ENCOUNTER — Other Ambulatory Visit: Payer: Medicare Other

## 2019-05-27 ENCOUNTER — Ambulatory Visit (INDEPENDENT_AMBULATORY_CARE_PROVIDER_SITE_OTHER): Payer: Medicare Other | Admitting: Family Medicine

## 2019-05-27 ENCOUNTER — Encounter: Payer: Self-pay | Admitting: Family Medicine

## 2019-05-27 VITALS — BP 123/70 | HR 78 | Temp 98.4°F | Wt 321.0 lb

## 2019-05-27 DIAGNOSIS — Z20822 Contact with and (suspected) exposure to covid-19: Secondary | ICD-10-CM | POA: Diagnosis not present

## 2019-05-27 DIAGNOSIS — G609 Hereditary and idiopathic neuropathy, unspecified: Secondary | ICD-10-CM

## 2019-05-27 NOTE — Patient Instructions (Addendum)
[facility]  Address: [address], [place], [place] [address] Phone: [phone]

## 2019-05-27 NOTE — Assessment & Plan Note (Signed)
Information given to patient to call [facility] and try to reschedule appt now that she's comfortable going. Continue gabapentin, declines trying any other medications at this time

## 2019-05-27 NOTE — Progress Notes (Signed)
BP 123/70   Pulse 78   Temp 98.4 F (36.9 C) (Oral)   Wt (!) 321 lb (145.6 kg)   SpO2 94%   BMI 53.42 kg/m    Subjective:    Patient ID: Michelle Jenkins, female    DOB: October 12, 1945, 74 y.o.   MRN: [id number]  HPI: Michelle Jenkins is a 74 y.o. female  Chief Complaint  Patient presents with  . Covid 19 exposure    f/u pt states that shehas had a negative covid test [facility]   Presenting today for f/u after COVID 19 exposure nearly 2 weeks ago. Was told by [facility] Nurse to make a f/u appt with PCP. Had negative COVID test last week and has been asymptomatic throughout entire quarantine duration. Feeling in usual state of health, no fevers, chills, CP, SOB, N/V/D, congestion, cough.   Still having tingling pain in b/l feet, was referred to Neurology for this last year per patient request but she cancelled appt due to COVID at that time. She would like to now go get evaluated given persistent bothersome sxs.   Relevant past medical, surgical, family and social history reviewed and updated as indicated. Interim medical history since our last visit reviewed. Allergies and medications reviewed and updated.  Review of Systems  Per HPI unless specifically indicated above     Objective:    BP 123/70   Pulse 78   Temp 98.4 F (36.9 C) (Oral)   Wt (!) 321 lb (145.6 kg)   SpO2 94%   BMI 53.42 kg/m   Wt Readings from Last 3 Encounters:  05/27/19 (!) 321 lb (145.6 kg)  02/12/19 (!) 324 lb (147 kg)  01/05/19 (!) 323 lb (146.5 kg)    Physical Exam Vitals and nursing note reviewed.  Constitutional:      Appearance: Normal appearance. She is not ill-appearing.  HENT:     Head: Atraumatic.  Eyes:     Extraocular Movements: Extraocular movements intact.     Conjunctiva/sclera: Conjunctivae normal.  Cardiovascular:     Rate and Rhythm: Normal rate and regular rhythm.     Pulses: Normal pulses.  Pulmonary:     Effort: Pulmonary effort is normal.     Breath sounds: Normal breath sounds.  Musculoskeletal:        General: Normal range of motion.     Cervical back: Normal range of motion and neck supple.  Skin:    General: Skin is warm and dry.  Neurological:     Mental Status: She is alert and oriented to person, place, and time.  Psychiatric:        Mood and Affect: Mood normal.        Thought Content: Thought content normal.        Judgment: Judgment normal.     Results for orders placed or performed in visit on 02/12/19  Cologuard  Result Value Ref Range   Cologuard Negative Negative      Assessment & Plan:   Problem List Items Addressed This Visit      Nervous and Auditory   Peripheral neuropathy    Information given to patient to call [facility] and try to reschedule appt now that she's comfortable going. Continue gabapentin, declines trying any other medications at this time       Other Visit Diagnoses    Exposure to COVID-19 virus    -  Primary   Negative COVID test through health  dept, ending isolation period now. Remains asymtomatic. Continue to monitor and use precautions for safety       Follow up plan: Return in about 4 weeks (around 06/24/2019) for as scheduled.

## 2019-06-02 ENCOUNTER — Other Ambulatory Visit: Payer: Self-pay | Admitting: Family Medicine

## 2019-06-11 ENCOUNTER — Ambulatory Visit: Payer: Medicare Other

## 2019-06-12 ENCOUNTER — Ambulatory Visit: Payer: Medicare Other | Attending: Internal Medicine

## 2019-06-12 ENCOUNTER — Other Ambulatory Visit: Payer: Self-pay

## 2019-06-12 DIAGNOSIS — Z23 Encounter for immunization: Secondary | ICD-10-CM

## 2019-06-12 NOTE — Progress Notes (Signed)
   Covid-19 Vaccination Clinic  Name:  Michelle Jenkins    MRN: [id number] DOB: 15-May-1945  06/12/2019  Michelle Jenkins was observed post Covid-19 immunization for 15 minutes without incidence. She was provided with Vaccine Information Sheet and instruction to access the V-Safe system.   Michelle Jenkins was instructed to call 911 with any severe reactions post vaccine: Marland Kitchen Difficulty breathing  . Swelling of your face and throat  . A fast heartbeat  . A bad rash all over your body  . Dizziness and weakness    Immunizations Administered    Name Date Dose VIS Date Route   Pfizer COVID-19 Vaccine 06/12/2019 11:54 AM 0.3 mL 03/27/2019 Intramuscular   Manufacturer: [place]   Lot: [id number]   [place]: [id number]

## 2019-07-06 ENCOUNTER — Ambulatory Visit: Payer: Medicare Other | Admitting: Family Medicine

## 2019-07-08 ENCOUNTER — Ambulatory Visit: Payer: Medicare Other | Attending: Internal Medicine

## 2019-07-08 ENCOUNTER — Ambulatory Visit (INDEPENDENT_AMBULATORY_CARE_PROVIDER_SITE_OTHER): Payer: Medicare Other | Admitting: Family Medicine

## 2019-07-08 ENCOUNTER — Encounter: Payer: Self-pay | Admitting: Family Medicine

## 2019-07-08 ENCOUNTER — Other Ambulatory Visit: Payer: Self-pay

## 2019-07-08 VITALS — BP 120/85 | HR 60 | Temp 98.6°F | Ht 65.0 in | Wt 328.0 lb

## 2019-07-08 DIAGNOSIS — I10 Essential (primary) hypertension: Secondary | ICD-10-CM | POA: Diagnosis not present

## 2019-07-08 DIAGNOSIS — I2510 Atherosclerotic heart disease of native coronary artery without angina pectoris: Secondary | ICD-10-CM

## 2019-07-08 DIAGNOSIS — I270 Primary pulmonary hypertension: Secondary | ICD-10-CM

## 2019-07-08 DIAGNOSIS — I4891 Unspecified atrial fibrillation: Secondary | ICD-10-CM

## 2019-07-08 DIAGNOSIS — G4733 Obstructive sleep apnea (adult) (pediatric): Secondary | ICD-10-CM

## 2019-07-08 DIAGNOSIS — E7800 Pure hypercholesterolemia, unspecified: Secondary | ICD-10-CM

## 2019-07-08 DIAGNOSIS — Z23 Encounter for immunization: Secondary | ICD-10-CM

## 2019-07-08 DIAGNOSIS — I509 Heart failure, unspecified: Secondary | ICD-10-CM

## 2019-07-08 DIAGNOSIS — R7309 Other abnormal glucose: Secondary | ICD-10-CM

## 2019-07-08 NOTE — Progress Notes (Signed)
BP 120/85   Pulse 60   Temp 98.6 F (37 C) (Oral)   Ht 5\' 5"  (1.651 m)   Wt (!) 328 lb (148.8 kg)   SpO2 95%   BMI 54.58 kg/m    Subjective:    Patient ID: Michelle Jenkins, female    DOB: [birth year], 74 y.o.   MRN: [id number]  HPI: Michelle Jenkins is a 74 y.o. female  Chief Complaint  Patient presents with  . Hypertension  . Hyperlipidemia   Patient presenting today for 6 month f/u chronic conditions.   HTN, atrial fib, CHF - BPs at home stable and in the 120/80s range. Taking eliquis without bleeding or bruising issues. Denies edema, CP, SOB (beyond baseline), palpitations, dizziness, syncope.   HLD, CAD - on lipitor and zetia, tolerating well. Denies claudication, myalgias. Trying to watch diet, unable to exercise due to joint pains and SOB issues.   Pulmonary hypertension - followed by Pulmonology, on supplemental O2 prn. Does have DOE frequently, improved with use of O2.   Relevant past medical, surgical, family and social history reviewed and updated as indicated. Interim medical history since our last visit reviewed. Allergies and medications reviewed and updated.  Review of Systems  Per HPI unless specifically indicated above     Objective:    BP 120/85   Pulse 60   Temp 98.6 F (37 C) (Oral)   Ht 5\' 5"  (1.651 m)   Wt (!) 328 lb (148.8 kg)   SpO2 95%   BMI 54.58 kg/m   Wt Readings from Last 3 Encounters:  07/08/19 (!) 328 lb (148.8 kg)  05/27/19 (!) 321 lb (145.6 kg)  02/12/19 (!) 324 lb (147 kg)    Physical Exam Vitals and nursing note reviewed.  Constitutional:      Appearance: Normal appearance. She is obese. She is not ill-appearing.  HENT:     Head: Atraumatic.  Eyes:     Extraocular Movements: Extraocular movements intact.     Conjunctiva/sclera: Conjunctivae normal.  Cardiovascular:     Rate and Rhythm: Normal rate and regular rhythm.     Heart sounds: Normal heart sounds.  Pulmonary:     Effort: Pulmonary effort is normal.   Breath sounds: Normal breath sounds.  Musculoskeletal:        General: Normal range of motion.     Cervical back: Normal range of motion and neck supple.  Skin:    General: Skin is warm and dry.  Neurological:     Mental Status: She is alert and oriented to person, place, and time.  Psychiatric:        Mood and Affect: Mood normal.        Thought Content: Thought content normal.        Judgment: Judgment normal.     Results for orders placed or performed in visit on 07/08/19  HgB A1c  Result Value Ref Range   Hgb A1c MFr Bld 5.7 (H) 4.8 - 5.6 %   Est. average glucose Bld gHb Est-mCnc 117 mg/dL  CBC with Differential/Platelet  Result Value Ref Range   WBC 5.1 3.4 - 10.8 x10E3/uL   RBC 3.39 (L) 3.77 - 5.28 x10E6/uL   Hemoglobin 10.9 (L) 11.1 - 15.9 g/dL   Hematocrit 33.1 (L) 34.0 - 46.6 %   MCV 98 (H) 79 - 97 fL   MCH 32.2 26.6 - 33.0 pg   MCHC 32.9 31.5 - 35.7 g/dL   RDW 13.4 11.7 - 15.4 %  Platelets 173 150 - 450 x10E3/uL   Neutrophils 46 Not Estab. %   Lymphs 35 Not Estab. %   Monocytes 14 Not Estab. %   Eos 4 Not Estab. %   Basos 1 Not Estab. %   Neutrophils Absolute 2.4 1.4 - 7.0 x10E3/uL   Lymphocytes Absolute 1.8 0.7 - 3.1 x10E3/uL   Monocytes Absolute 0.7 0.1 - 0.9 x10E3/uL   EOS (ABSOLUTE) 0.2 0.0 - 0.4 x10E3/uL   Basophils Absolute 0.0 0.0 - 0.2 x10E3/uL   Immature Granulocytes 0 Not Estab. %   Immature Grans (Abs) 0.0 0.0 - 0.1 x10E3/uL  Comprehensive metabolic panel  Result Value Ref Range   Glucose 91 65 - 99 mg/dL   BUN 19 8 - 27 mg/dL   Creatinine, Ser 1.16 (H) 0.57 - 1.00 mg/dL   GFR calc non Af Amer 46 (L) >59 mL/min/1.73   GFR calc Af Amer 53 (L) >59 mL/min/1.73   BUN/Creatinine Ratio 16 12 - 28   Sodium 142 134 - 144 mmol/L   Potassium 4.0 3.5 - 5.2 mmol/L   Chloride 101 96 - 106 mmol/L   CO2 27 20 - 29 mmol/L   Calcium 9.6 8.7 - 10.3 mg/dL   Total Protein 6.8 6.0 - 8.5 g/dL   Albumin 4.5 3.7 - 4.7 g/dL   Globulin, Total 2.3 1.5 - 4.5 g/dL     Albumin/Globulin Ratio 2.0 1.2 - 2.2   Bilirubin Total 0.4 0.0 - 1.2 mg/dL   Alkaline Phosphatase 67 39 - 117 IU/L   AST 20 0 - 40 IU/L   ALT 13 0 - 32 IU/L  Lipid Panel w/o Chol/HDL Ratio  Result Value Ref Range   Cholesterol, Total 211 (H) 100 - 199 mg/dL   Triglycerides 53 0 - 149 mg/dL   HDL 97 >39 mg/dL   VLDL Cholesterol Cal 10 5 - 40 mg/dL   LDL Chol Calc (NIH) 104 (H) 0 - 99 mg/dL      Assessment & Plan:   Problem List Items Addressed This Visit      Cardiovascular and Mediastinum   Primary pulmonary hypertension ([place]) - Primary    Followed by Pulmonology, continue per their recommendations      CAD in native artery    Stable, recheck lipids and adjust as needed. Continue current regimen      Relevant Orders   Comprehensive metabolic panel (Completed)   Lipid Panel w/o Chol/HDL Ratio (Completed)   Hypertension    Stable and well controlled, continue current regimen      Relevant Orders   CBC with Differential/Platelet (Completed)   Atrial fibrillation (HCC)    Stable, rate well controlled. Continue current regimen      CHF (congestive heart failure) (HCC)    Stable, appears euvolemic today. Continue current regimen        Respiratory   Obstructive apnea    Stable, uses cpap. Continue working on weight loss        Other   Hypercholesteremia    Recheck labs, adjust as needed. Continue working on lifestyle modifications for improved control       Other Visit Diagnoses    Elevated glucose       Relevant Orders   HgB A1c (Completed)       Follow up plan: Return in about 6 months (around 01/08/2020) for CPE.

## 2019-07-08 NOTE — Progress Notes (Signed)
   Covid-19 Vaccination Clinic  Name:  Michelle Jenkins    MRN: [id number] DOB: 1945-09-17  07/08/2019  Michelle Jenkins was observed post Covid-19 immunization for 15 minutes without incident. She was provided with Vaccine Information Sheet and instruction to access the V-Safe system.   Michelle Jenkins was instructed to call 911 with any severe reactions post vaccine: Marland Kitchen Difficulty breathing  . Swelling of face and throat  . A fast heartbeat  . A bad rash all over body  . Dizziness and weakness   Immunizations Administered    Name Date Dose VIS Date Route   Pfizer COVID-19 Vaccine 07/08/2019  2:12 PM 0.3 mL 03/27/2019 Intramuscular   Manufacturer: [place]   Lot: [id number]   [place]: [id number]

## 2019-07-09 LAB — CBC WITH DIFFERENTIAL/PLATELET
Basophils Absolute: 0 10*3/uL (ref 0.0–0.2)
Basos: 1 %
EOS (ABSOLUTE): 0.2 10*3/uL (ref 0.0–0.4)
Eos: 4 %
Hematocrit: 33.1 % — ABNORMAL LOW (ref 34.0–46.6)
Hemoglobin: 10.9 g/dL — ABNORMAL LOW (ref 11.1–15.9)
Immature Grans (Abs): 0 10*3/uL (ref 0.0–0.1)
Immature Granulocytes: 0 %
Lymphocytes Absolute: 1.8 10*3/uL (ref 0.7–3.1)
Lymphs: 35 %
MCH: 32.2 pg (ref 26.6–33.0)
MCHC: 32.9 g/dL (ref 31.5–35.7)
MCV: 98 fL — ABNORMAL HIGH (ref 79–97)
Monocytes Absolute: 0.7 10*3/uL (ref 0.1–0.9)
Monocytes: 14 %
Neutrophils Absolute: 2.4 10*3/uL (ref 1.4–7.0)
Neutrophils: 46 %
Platelets: 173 10*3/uL (ref 150–450)
RBC: 3.39 x10E6/uL — ABNORMAL LOW (ref 3.77–5.28)
RDW: 13.4 % (ref 11.7–15.4)
WBC: 5.1 10*3/uL (ref 3.4–10.8)

## 2019-07-09 LAB — COMPREHENSIVE METABOLIC PANEL
ALT: 13 IU/L (ref 0–32)
AST: 20 IU/L (ref 0–40)
Albumin/Globulin Ratio: 2 (ref 1.2–2.2)
Albumin: 4.5 g/dL (ref 3.7–4.7)
Alkaline Phosphatase: 67 IU/L (ref 39–117)
BUN/Creatinine Ratio: 16 (ref 12–28)
BUN: 19 mg/dL (ref 8–27)
Bilirubin Total: 0.4 mg/dL (ref 0.0–1.2)
CO2: 27 mmol/L (ref 20–29)
Calcium: 9.6 mg/dL (ref 8.7–10.3)
Chloride: 101 mmol/L (ref 96–106)
Creatinine, Ser: 1.16 mg/dL — ABNORMAL HIGH (ref 0.57–1.00)
GFR calc Af Amer: 53 mL/min/{1.73_m2} — ABNORMAL LOW (ref >59)
GFR calc non Af Amer: 46 mL/min/{1.73_m2} — ABNORMAL LOW (ref >59)
Globulin, Total: 2.3 g/dL (ref 1.5–4.5)
Glucose: 91 mg/dL (ref 65–99)
Potassium: 4 mmol/L (ref 3.5–5.2)
Sodium: 142 mmol/L (ref 134–144)
Total Protein: 6.8 g/dL (ref 6.0–8.5)

## 2019-07-09 LAB — LIPID PANEL W/O CHOL/HDL RATIO
Cholesterol, Total: 211 mg/dL — ABNORMAL HIGH (ref 100–199)
HDL: 97 mg/dL (ref >39)
LDL Chol Calc (NIH): 104 mg/dL — ABNORMAL HIGH (ref 0–99)
Triglycerides: 53 mg/dL (ref 0–149)
VLDL Cholesterol Cal: 10 mg/dL (ref 5–40)

## 2019-07-09 LAB — HEMOGLOBIN A1C
Est. average glucose Bld gHb Est-mCnc: 117 mg/dL
Hgb A1c MFr Bld: 5.7 % — ABNORMAL HIGH (ref 4.8–5.6)

## 2019-07-10 ENCOUNTER — Encounter: Payer: Self-pay | Admitting: Family Medicine

## 2019-07-10 DIAGNOSIS — R7301 Impaired fasting glucose: Secondary | ICD-10-CM | POA: Insufficient documentation

## 2019-07-12 NOTE — Assessment & Plan Note (Signed)
Stable, recheck lipids and adjust as needed. Continue current regimen

## 2019-07-12 NOTE — Assessment & Plan Note (Signed)
Stable, uses cpap. Continue working on weight loss

## 2019-07-12 NOTE — Assessment & Plan Note (Signed)
Recheck labs, adjust as needed. Continue working on lifestyle modifications for improved control

## 2019-07-12 NOTE — Assessment & Plan Note (Signed)
Followed by Pulmonology, continue per their recommendations

## 2019-07-12 NOTE — Assessment & Plan Note (Signed)
Stable, appears euvolemic today. Continue current regimen

## 2019-07-12 NOTE — Assessment & Plan Note (Signed)
Stable, rate well controlled. Continue current regimen

## 2019-07-12 NOTE — Assessment & Plan Note (Signed)
Stable and well controlled, continue current regimen 

## 2019-07-13 ENCOUNTER — Other Ambulatory Visit: Payer: Self-pay | Admitting: Family Medicine

## 2019-07-13 NOTE — Telephone Encounter (Signed)
Requested Prescriptions  Pending Prescriptions Disp Refills  . ezetimibe (ZETIA) 10 MG tablet [Pharmacy Med Name: EZETIMIBE 10MG  TABLETS] 90 tablet 1    Sig: TAKE 1 TABLET(10 MG) BY MOUTH DAILY     Cardiovascular:  Antilipid - Sterol Transport Inhibitors Failed - 07/13/2019  8:04 AM      Failed - Total Cholesterol in normal range and within 360 days    Cholesterol, Total  Date Value Ref Range Status  07/08/2019 211 (H) 100 - 199 mg/dL Final   Cholesterol Piccolo, Waived  Date Value Ref Range Status  09/05/2016 154 <200 mg/dL Final    Comment:                            Desirable                <200                         Borderline High      200- 239                         High                     >239          Failed - LDL in normal range and within 360 days    LDL Chol Calc (NIH)  Date Value Ref Range Status  07/08/2019 104 (H) 0 - 99 mg/dL Final         Passed - HDL in normal range and within 360 days    HDL  Date Value Ref Range Status  07/08/2019 97 >39 mg/dL Final         Passed - Triglycerides in normal range and within 360 days    Triglycerides  Date Value Ref Range Status  07/08/2019 53 0 - 149 mg/dL Final   Triglycerides Piccolo,Waived  Date Value Ref Range Status  09/05/2016 46 <150 mg/dL Final    Comment:                            Normal                   <150                         Borderline High     150 - 199                         High                200 - 499                         Very High                >499          Passed - Valid encounter within last 12 months    Recent Outpatient Visits          5 days ago Primary pulmonary hypertension [facility])   [place], Lilia Argue, [place]   1 month ago Exposure to COVID-19 virus   [place], [place]   3 months ago Myalgia  [place], [place], [place]   6 months ago Atrial fibrillation, unspecified type [facility])    [facility], [place]   1 year ago Atrial fibrillation, unspecified type [facility])   [facility] [place], [place]      Future Appointments            In 6 months Orene Desanctis, [place] Argue, [place], [place]   In 7 months  [employer], [place]           . labetalol (NORMODYNE) 200 MG tablet [Pharmacy Med Name: LABETALOL 200MG  TABLETS] 180 tablet 1    Sig: TAKE 1 TABLET(200 MG) BY MOUTH TWICE DAILY     Cardiovascular:  Beta Blockers Passed - 07/13/2019  8:04 AM      Passed - Last BP in normal range    BP Readings from Last 1 Encounters:  07/08/19 120/85         Passed - Last Heart Rate in normal range    Pulse Readings from Last 1 Encounters:  07/08/19 60         Passed - Valid encounter within last 6 months    Recent Outpatient Visits          5 days ago Primary pulmonary hypertension ([place])   [facility] [place], [place]   1 month ago Exposure to COVID-19 virus   [facility], [place], [place]   3 months ago [place], [place], [place]   6 months ago Atrial fibrillation, unspecified type [facility])   [facility] [place], [place]   1 year ago Atrial fibrillation, unspecified type [facility])   [facility] [place], [place]      Future Appointments            In 6 months Orene Desanctis, [place], [place], [place]   In 7 months  [employer], [place]           . olmesartan-hydrochlorothiazide (BENICAR HCT) 20-12.5 MG tablet [Pharmacy Med Name: OLMESARTAN MEDOX/HCTZ 20-12.5MG  TAB] 90 tablet 1    Sig: TAKE 1 TABLET BY MOUTH DAILY     Cardiovascular: ARB + Diuretic Combos Failed - 07/13/2019  8:04 AM      Failed - Cr in normal range and within 180 days    Creatinine, Ser  Date Value Ref Range Status  07/08/2019 1.16 (H) 0.57 - 1.00 mg/dL Final          Passed - K in normal range and within 180 days    Potassium  Date Value Ref Range Status  07/08/2019 4.0 3.5 - 5.2 mmol/L Final         Passed - Na in normal range and within 180 days    Sodium  Date Value Ref Range Status  07/08/2019 142 134 - 144 mmol/L Final         Passed - Ca in normal range and within 180 days    Calcium  Date Value Ref Range Status  07/08/2019 9.6 8.7 - 10.3 mg/dL Final         Passed - Patient is not pregnant      Passed - Last BP in normal range    BP Readings from Last 1 Encounters:  07/08/19 120/85         Passed - Valid encounter within last 6 months    Recent Outpatient  Visits          5 days ago Primary pulmonary hypertension [facility])   [facility] Merrie Roof [place], [place]   1 month ago Exposure to COVID-19 virus   [facility], [place], [place]   3 months ago [place], [place], [place]   6 months ago Atrial fibrillation, unspecified type [facility])   [facility], [place]   1 year ago Atrial fibrillation, unspecified type [facility])   [facility] [place], [place]      Future Appointments            In 6 months Orene Desanctis, [place], [place], [place]   In 7 months  [employer], [place]           . furosemide (LASIX) 20 MG tablet [Pharmacy Med Name: FUROSEMIDE 20MG  TABLETS] 180 tablet 0    Sig: TAKE 1 TABLET(20 MG) BY MOUTH TWICE DAILY AS NEEDED     Cardiovascular:  Diuretics - Loop Failed - 07/13/2019  8:04 AM      Failed - Cr in normal range and within 360 days    Creatinine, Ser  Date Value Ref Range Status  07/08/2019 1.16 (H) 0.57 - 1.00 mg/dL Final         Passed - K in normal range and within 360 days    Potassium  Date Value Ref Range Status  07/08/2019 4.0 3.5 - 5.2 mmol/L Final         Passed - Ca in normal range and within 360 days    Calcium  Date  Value Ref Range Status  07/08/2019 9.6 8.7 - 10.3 mg/dL Final         Passed - Na in normal range and within 360 days    Sodium  Date Value Ref Range Status  07/08/2019 142 134 - 144 mmol/L Final         Passed - Last BP in normal range    BP Readings from Last 1 Encounters:  07/08/19 120/85         Passed - Valid encounter within last 6 months    Recent Outpatient Visits          5 days ago Primary pulmonary hypertension ([place])   [facility] [place], [place]   1 month ago Exposure to COVID-19 virus   [facility], L[place], [place]   3 months ago [place], [place], [place]   6 months ago Atrial fibrillation, unspecified type [facility])   [facility] [facility], [place]   1 year ago Atrial fibrillation, unspecified type [facility])   [facility], [place]      Future Appointments            In 6 months Orene Desanctis, Lilia Argue, [place], [place]   In 7 months  [employer], [place]           . ELIQUIS 5 MG TABS tablet [Pharmacy Med Name: ELIQUIS 5MG  TABLETS] 180 tablet 1    Sig: TAKE 1 TABLET BY MOUTH TWICE DAILY     Hematology:  Anticoagulants Failed - 07/13/2019  8:04 AM      Failed - HGB in normal range and within 360 days    Hemoglobin  Date Value Ref Range Status  07/08/2019 10.9 (L) 11.1 - 15.9 g/dL  Final         Failed - HCT in normal range and within 360 days    Hematocrit  Date Value Ref Range Status  07/08/2019 33.1 (L) 34.0 - 46.6 % Final         Failed - Cr in normal range and within 360 days    Creatinine, Ser  Date Value Ref Range Status  07/08/2019 1.16 (H) 0.57 - 1.00 mg/dL Final         Passed - PLT in normal range and within 360 days    Platelets  Date Value Ref Range Status  07/08/2019 173 150 - 450 x10E3/uL Final         Passed - Valid encounter within last 12 months    Recent  Outpatient Visits          5 days ago Primary pulmonary hypertension [facility])   [facility] [place], [place]   1 month ago Exposure to COVID-19 virus   [facility], L[place], [place]   3 months ago [place], [place], [place]   6 months ago Atrial fibrillation, unspecified type [facility])   [facility] [facility], [place]   1 year ago Atrial fibrillation, unspecified type [facility])   [facility] [place], [place]      Future Appointments            In 6 months Orene Desanctis, [place] Argue, [place], [place]   In 7 months  [employer], [facility]           . pantoprazole (PROTONIX) 40 MG tablet [Pharmacy Med Name: PANTOPRAZOLE 40MG  TABLETS] 90 tablet 1    Sig: TAKE 1 TABLET(40 MG) BY MOUTH DAILY     Gastroenterology: Proton Pump Inhibitors Passed - 07/13/2019  8:04 AM      Passed - Valid encounter within last 12 months    Recent Outpatient Visits          5 days ago Primary pulmonary hypertension [facility])   [facility], [place]   1 month ago Exposure to COVID-19 virus   [facility] L[place] [facility], [place]   3 months ago [place], [place]   6 months ago Atrial fibrillation, unspecified type [facility])   [facility], [place]   1 year ago Atrial fibrillation, unspecified type [facility])   [facility] [place], [place]      Future Appointments            In 6 months Orene Desanctis, Lilia Argue, [place], [place]   In 7 months  [employer], [place]

## 2019-07-20 ENCOUNTER — Ambulatory Visit: Payer: Self-pay | Admitting: *Deleted

## 2019-07-20 NOTE — Telephone Encounter (Signed)
Patient calls with upper back pain, midline to the left side. Began over a week a ago. Comes and goes depending on her movement.Does not radiate. Has pain with her feet but denies numbness/tingling in legs. Does feel short of breath "takes my breath" with the pain when she moves. Denies fever/CP/no difficulty voiding or with bladder.No injury of strenuous exercise.Care advice including take anti inflammatory today and tomorrow. Do slow gentle stretches twice daily. Heating pad to the back.Appointment scheduled for 07/22/19. Will call back if pain worsens.  Reason for Disposition . [1] MODERATE back pain (e.g., interferes with normal activities) AND [2] present > 3 days  Answer Assessment - Initial Assessment Questions 1. ONSET: "When did the pain begin?"     Over one week ago 2. LOCATION: "Where does it hurt?" (upper, mid or lower back)     Upper back  3. SEVERITY: "How bad is the pain?"  (e.g., Scale 1-10; mild, moderate, or severe)   - MILD (1-3): doesn't interfere with normal activities    - MODERATE (4-7): interferes with normal activities or awakens from sleep    - SEVERE (8-10): excruciating pain, unable to do any normal activities     severe 4. PATTERN: "Is the pain constant?" (e.g., yes, no; constant, intermittent)     Comes and goes 5. RADIATION: "Does the pain shoot into your legs or elsewhere?"     no 6. CAUSE:  "What do you think is causing the back pain?"     unsure 7. BACK OVERUSE:  "Any recent lifting of heavy objects, strenuous work or exercise?"     no 8. MEDICATIONS: "What have you taken so far for the pain?" (e.g., nothing, acetaminophen, NSAIDS)     nothing 9. NEUROLOGIC SYMPTOMS: "Do you have any weakness, numbness, or problems with bowel/bladder control?"     none 10. OTHER SYMPTOMS: "Do you have any other symptoms?" (e.g., fever, abdominal pain, burning with urination, blood in urine)       no 11. PREGNANCY: "Is there any chance you are pregnant?" (e.g., yes, no;  LMP)       na  Protocols used: BACK PAIN-A-AH

## 2019-07-22 ENCOUNTER — Other Ambulatory Visit: Payer: Self-pay

## 2019-07-22 ENCOUNTER — Ambulatory Visit (INDEPENDENT_AMBULATORY_CARE_PROVIDER_SITE_OTHER): Payer: Medicare Other | Admitting: Family Medicine

## 2019-07-22 ENCOUNTER — Encounter: Payer: Self-pay | Admitting: Family Medicine

## 2019-07-22 VITALS — BP 125/82 | HR 62 | Temp 98.4°F | Wt 329.0 lb

## 2019-07-22 DIAGNOSIS — M549 Dorsalgia, unspecified: Secondary | ICD-10-CM

## 2019-07-22 DIAGNOSIS — R079 Chest pain, unspecified: Secondary | ICD-10-CM | POA: Diagnosis not present

## 2019-07-22 NOTE — Progress Notes (Signed)
BP 125/82   Pulse 62   Temp 98.4 F (36.9 C) (Oral)   Wt (!) 329 lb (149.2 kg)   SpO2 91%   BMI 54.75 kg/m    Subjective:    Patient ID: Michelle Jenkins, female    DOB: 1945/09/07, 74 y.o.   MRN: [id number]  HPI: Michelle Jenkins is a 74 y.o. female  Chief Complaint  Patient presents with  . Back Pain    middle back moving towars left breast x 3 weeks. worsening lately   Several weeks of left mid back pain that is now wrapping around toward breasts. Initially noticed it when laying in bed, now off and on throughout the day. Worse with deep breaths, sneezing, and certain movements. Denies masses or skin changes of breast. Pt feels this may be related to not having a supportive bra and plans to go find a more supportive one this weekend. Denies overt CP, worsened SOB or increased need for supplemental O2. Does have a known hx of CHF, MI, and on chronic supplemental O2 prn.   Relevant past medical, surgical, family and social history reviewed and updated as indicated. Interim medical history since our last visit reviewed. Allergies and medications reviewed and updated.  Review of Systems  Per HPI unless specifically indicated above     Objective:    BP 125/82   Pulse 62   Temp 98.4 F (36.9 C) (Oral)   Wt (!) 329 lb (149.2 kg)   SpO2 91%   BMI 54.75 kg/m   Wt Readings from Last 3 Encounters:  07/22/19 (!) 329 lb (149.2 kg)  07/08/19 (!) 328 lb (148.8 kg)  05/27/19 (!) 321 lb (145.6 kg)    Physical Exam Vitals and nursing note reviewed.  Constitutional:      Appearance: Normal appearance. She is obese. She is not ill-appearing.  HENT:     Head: Atraumatic.  Eyes:     Extraocular Movements: Extraocular movements intact.     Conjunctiva/sclera: Conjunctivae normal.  Cardiovascular:     Rate and Rhythm: Normal rate and regular rhythm.     Heart sounds: Normal heart sounds.  Pulmonary:     Effort: Pulmonary effort is normal.     Breath sounds: Normal breath  sounds. No wheezing or rales.  Chest:     Breasts:        Right: No mass, skin change or tenderness.        Left: No mass, skin change or tenderness.  Abdominal:     General: Bowel sounds are normal. There is no distension.     Palpations: Abdomen is soft.     Tenderness: There is no abdominal tenderness.  Musculoskeletal:        General: No swelling or tenderness. Normal range of motion.     Cervical back: Normal range of motion and neck supple.  Lymphadenopathy:     Upper Body:     Right upper body: No axillary adenopathy.     Left upper body: No axillary adenopathy.  Skin:    General: Skin is warm and dry.  Neurological:     Mental Status: She is alert and oriented to person, place, and time.  Psychiatric:        Mood and Affect: Mood normal.        Thought Content: Thought content normal.        Judgment: Judgment normal.     Results for orders placed or performed in visit on 07/08/19  HgB A1c  Result Value Ref Range   Hgb A1c MFr Bld 5.7 (H) 4.8 - 5.6 %   Est. average glucose Bld gHb Est-mCnc 117 mg/dL  CBC with Differential/Platelet  Result Value Ref Range   WBC 5.1 3.4 - 10.8 x10E3/uL   RBC 3.39 (L) 3.77 - 5.28 x10E6/uL   Hemoglobin 10.9 (L) 11.1 - 15.9 g/dL   Hematocrit 33.1 (L) 34.0 - 46.6 %   MCV 98 (H) 79 - 97 fL   MCH 32.2 26.6 - 33.0 pg   MCHC 32.9 31.5 - 35.7 g/dL   RDW 13.4 11.7 - 15.4 %   Platelets 173 150 - 450 x10E3/uL   Neutrophils 46 Not Estab. %   Lymphs 35 Not Estab. %   Monocytes 14 Not Estab. %   Eos 4 Not Estab. %   Basos 1 Not Estab. %   Neutrophils Absolute 2.4 1.4 - 7.0 x10E3/uL   Lymphocytes Absolute 1.8 0.7 - 3.1 x10E3/uL   Monocytes Absolute 0.7 0.1 - 0.9 x10E3/uL   EOS (ABSOLUTE) 0.2 0.0 - 0.4 x10E3/uL   Basophils Absolute 0.0 0.0 - 0.2 x10E3/uL   Immature Granulocytes 0 Not Estab. %   Immature Grans (Abs) 0.0 0.0 - 0.1 x10E3/uL  Comprehensive metabolic panel  Result Value Ref Range   Glucose 91 65 - 99 mg/dL   BUN 19 8 - 27  mg/dL   Creatinine, Ser 1.16 (H) 0.57 - 1.00 mg/dL   GFR calc non Af Amer 46 (L) >59 mL/min/1.73   GFR calc Af Amer 53 (L) >59 mL/min/1.73   BUN/Creatinine Ratio 16 12 - 28   Sodium 142 134 - 144 mmol/L   Potassium 4.0 3.5 - 5.2 mmol/L   Chloride 101 96 - 106 mmol/L   CO2 27 20 - 29 mmol/L   Calcium 9.6 8.7 - 10.3 mg/dL   Total Protein 6.8 6.0 - 8.5 g/dL   Albumin 4.5 3.7 - 4.7 g/dL   Globulin, Total 2.3 1.5 - 4.5 g/dL   Albumin/Globulin Ratio 2.0 1.2 - 2.2   Bilirubin Total 0.4 0.0 - 1.2 mg/dL   Alkaline Phosphatase 67 39 - 117 IU/L   AST 20 0 - 40 IU/L   ALT 13 0 - 32 IU/L  Lipid Panel w/o Chol/HDL Ratio  Result Value Ref Range   Cholesterol, Total 211 (H) 100 - 199 mg/dL   Triglycerides 53 0 - 149 mg/dL   HDL 97 >39 mg/dL   VLDL Cholesterol Cal 10 5 - 40 mg/dL   LDL Chol Calc (NIH) 104 (H) 0 - 99 mg/dL      Assessment & Plan:   Problem List Items Addressed This Visit    None    Visit Diagnoses    Chest pain, unspecified type    -  Primary   Given atypical upper back/radiating toward chest sxs EKG performed and without acute ST/T wave changes, stable atrial fibrillation. Obtain CXR, monitor closely   Relevant Orders   EKG 12-Lead (Completed)   DG Chest 2 View   Upper back pain       Possibly muscular, declines breast imaging and wishes to try new bra and supportive home care. Return precautions reviewed     Greater than 25 minutes spent today in direct patient care and counseling  Follow up plan: Return for as scheduled.

## 2019-07-22 NOTE — Patient Instructions (Signed)
[facility] [place], [place] Open 8-5 Monday through Thursday for imaging

## 2019-08-05 ENCOUNTER — Other Ambulatory Visit: Payer: Self-pay

## 2019-08-05 ENCOUNTER — Ambulatory Visit
Admission: RE | Admit: 2019-08-05 | Discharge: 2019-08-05 | Disposition: A | Discharge status: Home / Self Care | Payer: Medicare Other | Source: Ambulatory Visit | Attending: Family Medicine | Admitting: Family Medicine

## 2019-08-05 DIAGNOSIS — R079 Chest pain, unspecified: Secondary | ICD-10-CM | POA: Insufficient documentation

## 2019-09-22 ENCOUNTER — Other Ambulatory Visit
Admission: RE | Admit: 2019-09-22 | Discharge: 2019-09-22 | Disposition: A | Discharge status: Home / Self Care | Payer: Medicare Other | Source: Ambulatory Visit | Attending: Ophthalmology | Admitting: Ophthalmology

## 2019-09-22 ENCOUNTER — Encounter: Payer: Self-pay | Admitting: Ophthalmology

## 2019-09-22 ENCOUNTER — Other Ambulatory Visit: Payer: Self-pay

## 2019-09-22 DIAGNOSIS — Z01812 Encounter for preprocedural laboratory examination: Secondary | ICD-10-CM | POA: Diagnosis present

## 2019-09-22 DIAGNOSIS — Z20822 Contact with and (suspected) exposure to covid-19: Secondary | ICD-10-CM | POA: Diagnosis not present

## 2019-09-23 LAB — SARS CORONAVIRUS 2 (TAT 6-24 HRS): SARS Coronavirus 2: NEGATIVE

## 2019-09-23 NOTE — Discharge Instructions (Addendum)
Eye Surgery Discharge Instructions  Expect mild scratchy sensation or mild soreness. DO NOT RUB YOUR EYE!  The day of surgery: . Minimal physical activity, but bed rest is not required . No reading, computer work, or close hand work . No bending, lifting, or straining. . May watch TV  For 24 hours: . No driving, legal decisions, or alcoholic beverages . Safety precautions . Eat anything you prefer: It is better to start with liquids, then soup then solid foods. . _____ Eye patch should be worn until postoperative exam tomorrow. . ____ Solar shield eyeglasses should be worn for comfort in the sunlight/patch while sleeping  Resume all regular medications including aspirin or Coumadin if these were discontinued prior to surgery. You may shower, bathe, shave, or wash your hair. Tylenol may be taken for mild discomfort. Follow Dr. Porfilio's eye drop instruction sheet as reviewed.  Call your doctor if you experience significant pain, nausea, or vomiting, fever > 101 or other signs of infection. [phone] or [phone] Specific instructions:  Follow-up Information    Porfilio, William, MD Follow up.   Specialty: Ophthalmology Why: 09/25/19 @ 11:05 am Contact information: [address] [place] [address] [phone]           

## 2019-09-24 ENCOUNTER — Ambulatory Visit: Payer: Medicare Other | Admitting: Anesthesiology

## 2019-09-24 ENCOUNTER — Other Ambulatory Visit: Payer: Self-pay

## 2019-09-24 ENCOUNTER — Encounter: Payer: Self-pay | Admitting: Ophthalmology

## 2019-09-24 ENCOUNTER — Ambulatory Visit
Admission: RE | Admit: 2019-09-24 | Discharge: 2019-09-24 | Disposition: A | Discharge status: Home / Self Care | Payer: Medicare Other | Attending: Ophthalmology | Admitting: Ophthalmology

## 2019-09-24 ENCOUNTER — Encounter
Admission: RE | Disposition: A | Discharge status: Home / Self Care | Payer: Self-pay | Source: Home / Self Care | Attending: Ophthalmology

## 2019-09-24 DIAGNOSIS — Z9981 Dependence on supplemental oxygen: Secondary | ICD-10-CM | POA: Diagnosis not present

## 2019-09-24 DIAGNOSIS — Z7901 Long term (current) use of anticoagulants: Secondary | ICD-10-CM | POA: Diagnosis not present

## 2019-09-24 DIAGNOSIS — I252 Old myocardial infarction: Secondary | ICD-10-CM | POA: Diagnosis not present

## 2019-09-24 DIAGNOSIS — I482 Chronic atrial fibrillation, unspecified: Secondary | ICD-10-CM | POA: Diagnosis not present

## 2019-09-24 DIAGNOSIS — J449 Chronic obstructive pulmonary disease, unspecified: Secondary | ICD-10-CM | POA: Diagnosis not present

## 2019-09-24 DIAGNOSIS — I509 Heart failure, unspecified: Secondary | ICD-10-CM | POA: Diagnosis not present

## 2019-09-24 DIAGNOSIS — G4733 Obstructive sleep apnea (adult) (pediatric): Secondary | ICD-10-CM | POA: Diagnosis not present

## 2019-09-24 DIAGNOSIS — K219 Gastro-esophageal reflux disease without esophagitis: Secondary | ICD-10-CM | POA: Insufficient documentation

## 2019-09-24 DIAGNOSIS — H2511 Age-related nuclear cataract, right eye: Secondary | ICD-10-CM | POA: Diagnosis present

## 2019-09-24 DIAGNOSIS — H547 Unspecified visual loss: Secondary | ICD-10-CM | POA: Diagnosis not present

## 2019-09-24 DIAGNOSIS — I272 Pulmonary hypertension, unspecified: Secondary | ICD-10-CM | POA: Diagnosis not present

## 2019-09-24 DIAGNOSIS — Z87891 Personal history of nicotine dependence: Secondary | ICD-10-CM | POA: Insufficient documentation

## 2019-09-24 DIAGNOSIS — I110 Hypertensive heart disease with heart failure: Secondary | ICD-10-CM | POA: Diagnosis not present

## 2019-09-24 DIAGNOSIS — I2510 Atherosclerotic heart disease of native coronary artery without angina pectoris: Secondary | ICD-10-CM | POA: Insufficient documentation

## 2019-09-24 DIAGNOSIS — E7800 Pure hypercholesterolemia, unspecified: Secondary | ICD-10-CM | POA: Insufficient documentation

## 2019-09-24 DIAGNOSIS — Z79899 Other long term (current) drug therapy: Secondary | ICD-10-CM | POA: Insufficient documentation

## 2019-09-24 DIAGNOSIS — E669 Obesity, unspecified: Secondary | ICD-10-CM | POA: Diagnosis not present

## 2019-09-24 DIAGNOSIS — I69398 Other sequelae of cerebral infarction: Secondary | ICD-10-CM | POA: Insufficient documentation

## 2019-09-24 DIAGNOSIS — Z96652 Presence of left artificial knee joint: Secondary | ICD-10-CM | POA: Diagnosis not present

## 2019-09-24 HISTORY — PX: CATARACT EXTRACTION W/PHACO: SHX586

## 2019-09-24 SURGERY — PHACOEMULSIFICATION, CATARACT, WITH IOL INSERTION
Anesthesia: Monitor Anesthesia Care | Site: Eye | Laterality: Right

## 2019-09-24 MED ORDER — POVIDONE-IODINE 5 % OP SOLN
OPHTHALMIC | Status: DC | PRN
  Administered 2019-09-24: 1 via OPHTHALMIC

## 2019-09-24 MED ORDER — MOXIFLOXACIN HCL 0.5 % OP SOLN
OPHTHALMIC | Status: AC
  Filled 2019-09-24: qty 3

## 2019-09-24 MED ORDER — ONDANSETRON HCL 4 MG/2ML IJ SOLN
INTRAMUSCULAR | Status: DC | PRN
  Administered 2019-09-24: 4 mg via INTRAVENOUS

## 2019-09-24 MED ORDER — MOXIFLOXACIN HCL 0.5 % OP SOLN: 1.0000 [drp] | OPHTHALMIC | Status: DC | PRN

## 2019-09-24 MED ORDER — MOXIFLOXACIN HCL 0.5 % OP SOLN
OPHTHALMIC | Status: DC | PRN
  Administered 2019-09-24: 0.2 mL via OPHTHALMIC

## 2019-09-24 MED ORDER — CARBACHOL 0.01 % IO SOLN
INTRAOCULAR | Status: DC | PRN
  Administered 2019-09-24: 0.5 mL via INTRAOCULAR

## 2019-09-24 MED ORDER — TETRACAINE HCL 0.5 % OP SOLN
1.0000 [drp] | OPHTHALMIC | Status: AC | PRN
  Administered 2019-09-24: 1 [drp] via OPHTHALMIC

## 2019-09-24 MED ORDER — MIDAZOLAM HCL 2 MG/2ML IJ SOLN
INTRAMUSCULAR | Status: AC
  Filled 2019-09-24: qty 2

## 2019-09-24 MED ORDER — ARMC OPHTHALMIC DILATING DROPS
1.0000 "application " | OPHTHALMIC | Status: AC | PRN
  Administered 2019-09-24 (×2): 1 via OPHTHALMIC

## 2019-09-24 MED ORDER — SODIUM CHLORIDE 0.9 % IV SOLN: INTRAVENOUS | Status: DC

## 2019-09-24 MED ORDER — NA CHONDROIT SULF-NA HYALURON 40-17 MG/ML IO SOLN
INTRAOCULAR | Status: DC | PRN
  Administered 2019-09-24: 1 mL via INTRAOCULAR

## 2019-09-24 MED ORDER — ARMC OPHTHALMIC DILATING DROPS
OPHTHALMIC | Status: AC
  Administered 2019-09-24: 1 via OPHTHALMIC
  Filled 2019-09-24: qty 0.5

## 2019-09-24 MED ORDER — TETRACAINE HCL 0.5 % OP SOLN
OPHTHALMIC | Status: AC
  Administered 2019-09-24: 1 [drp] via OPHTHALMIC
  Filled 2019-09-24: qty 4

## 2019-09-24 MED ORDER — EPINEPHRINE PF 1 MG/ML IJ SOLN
INTRAOCULAR | Status: DC | PRN
  Administered 2019-09-24: 200 mL via OPHTHALMIC

## 2019-09-24 MED ORDER — LIDOCAINE HCL (PF) 4 % IJ SOLN
INTRAOCULAR | Status: DC | PRN
  Administered 2019-09-24: 2 mL via OPHTHALMIC

## 2019-09-24 MED ORDER — MIDAZOLAM HCL 2 MG/2ML IJ SOLN
INTRAMUSCULAR | Status: DC | PRN
  Administered 2019-09-24: .5 mg via INTRAVENOUS
  Administered 2019-09-24: 1 mg via INTRAVENOUS

## 2019-09-24 SURGICAL SUPPLY — 16 items
GLOVE BIO SURGEON STRL SZ8 (GLOVE) ×2 IMPLANT
GLOVE BIOGEL M 6.5 STRL (GLOVE) ×2 IMPLANT
GLOVE SURG LX 8.0 MICRO (GLOVE) ×1
GLOVE SURG LX STRL 8.0 MICRO (GLOVE) ×1 IMPLANT
GOWN STRL REUS W/ TWL LRG LVL3 (GOWN DISPOSABLE) ×2 IMPLANT
GOWN STRL REUS W/TWL LRG LVL3 (GOWN DISPOSABLE) ×2
LABEL CATARACT MEDS ST (LABEL) ×2 IMPLANT
LENS IOL DIOP 24.0 (Intraocular Lens) ×2 IMPLANT
LENS IOL TECNIS MONO 24.0 (Intraocular Lens) IMPLANT
PACK CATARACT BRASINGTON LX (MISCELLANEOUS) ×2 IMPLANT
PACK EYE AFTER SURG (MISCELLANEOUS) ×2 IMPLANT
SOL BSS BAG (MISCELLANEOUS) ×2
SOLUTION BSS BAG (MISCELLANEOUS) ×1 IMPLANT
SYR 5ML LL (SYRINGE) ×2 IMPLANT
WATER STERILE IRR 250ML POUR (IV SOLUTION) ×2 IMPLANT
WIPE NON LINTING 3.25X3.25 (MISCELLANEOUS) ×2 IMPLANT

## 2019-09-24 NOTE — H&P (Signed)
All labs reviewed. Abnormal studies sent to patients PCP when indicated.  Previous H&P reviewed, patient examined, there are NO CHANGES.  Michelle Sockwell Porfilio6/10/20219:58 AM

## 2019-09-24 NOTE — Anesthesia Postprocedure Evaluation (Signed)
Anesthesia Post Note  Patient: Michelle Jenkins  Procedure(s) Performed: CATARACT EXTRACTION PHACO AND INTRAOCULAR LENS PLACEMENT ([place]) RIGHT (Right Eye)  Patient location during evaluation: Phase II Anesthesia Type: MAC Level of consciousness: awake and alert and patient cooperative Pain management: pain level controlled Vital Signs Assessment: post-procedure vital signs reviewed and stable Respiratory status: spontaneous breathing Cardiovascular status: stable Postop Assessment: no apparent nausea or vomiting Anesthetic complications: no   No complications documented.   Last Vitals:  Vitals:   09/24/19 0906 09/24/19 1028  BP: 132/84 (!) 114/56  Pulse: (!) 56 72  Resp: 16 18  Temp: (!) 36.3 C (!) 36.1 C  SpO2: 94% 99%    Last Pain:  Vitals:   09/24/19 1028  TempSrc: Temporal  PainSc: 0-No pain                 Rhylynn Perdomo [place]

## 2019-09-24 NOTE — Anesthesia Preprocedure Evaluation (Signed)
Anesthesia Evaluation  Patient identified by MRN, date of birth, ID band Patient awake    Reviewed: Allergy & Precautions, H&P , NPO status , Patient's Chart, lab work & pertinent test results  Airway Mallampati: III  TM Distance: >3 FB Neck ROM: limited    Dental  (+) Chipped   Pulmonary shortness of breath, asthma , sleep apnea , former smoker,    Pulmonary exam normal        Cardiovascular hypertension, (-) angina+ CAD, + Past MI and +CHF  Normal cardiovascular exam+ dysrhythmias      Neuro/Psych  Neuromuscular disease CVA (vision loss), Residual Symptoms negative psych ROS   GI/Hepatic Neg liver ROS, GERD  Medicated and Controlled,  Endo/Other  negative endocrine ROS  Renal/GU      Musculoskeletal   Abdominal   Peds  Hematology negative hematology ROS (+)   Anesthesia Other Findings Past Medical History: No date: Anginal pain (HCC) No date: Asthma No date: Bronchitis No date: CAD (coronary artery disease) No date: CHF (congestive heart failure) (HCC) No date: CVA (cerebral infarction) No date: Dysrhythmia No date: GERD (gastroesophageal reflux disease) No date: History of chronic atrial fibrillation No date: Hypercholesteremia No date: Hypertension No date: Idiopathic pulmonary hypertension (HCC) No date: Myocardial infarction ([place]) No date: Obesity No date: OSA (obstructive sleep apnea) No date: Pre-diabetes No date: Pulmonary artery hypertension (HCC) No date: Shortness of breath dyspnea No date: Sleep apnea No date: Stroke [facility])  Past Surgical History: 11/15/2015: BREAST BIOPSY; Right     Comment:  stereo, COMPLEX SCLEROSING LESION WITH INTRADUCTAL               PAPILLOMA COMPONENT AND  02/27/2016: BREAST EXCISIONAL BIOPSY; Right     Comment:  NEG 02/27/2016: BREAST LUMPECTOMY WITH NEEDLE LOCALIZATION; Right     Comment:  Procedure: BREAST LUMPECTOMY WITH NEEDLE LOCALIZATION;                 Surgeon: Christene Lye, MD;  Location: [facility] ORS;               Service: General;  Laterality: Right; No date: CARDIAC CATHETERIZATION 12/07/2014: CARDIAC CATHETERIZATION; Right     Comment:  Procedure: Right and Left Heart Cath with Coronary               Angiography;  Surgeon: Teodoro Spray, MD;  Location:               [facility];  Service: Cardiovascular;                Laterality: Right; 1998  and 1999: CARPAL TUNNEL RELEASE 10/31/1998: COLONOSCOPY No date: DILATION AND CURETTAGE OF UTERUS No date: FOOT SURGERY; Right No date: HYSTEROSCOPY No date: JOINT REPLACEMENT; Left No date: KNEE ARTHROSCOPY AND ARTHROTOMY; Right No date: lt knee replacement No date: TONSILLECTOMY No date: UVULOPALATOPHARYNGOPLASTY     Reproductive/Obstetrics negative OB ROS                             Anesthesia Physical Anesthesia Plan  ASA: III  Anesthesia Plan: MAC   Post-op Pain Management:    Induction: Intravenous  PONV Risk Score and Plan:   Airway Management Planned: Natural Airway and Nasal Cannula  Additional Equipment:   Intra-op Plan:   Post-operative Plan:   Informed Consent: I have reviewed the patients History and Physical, chart, labs and discussed the procedure including the risks, benefits and  alternatives for the proposed anesthesia with the patient or authorized representative who has indicated his/her understanding and acceptance.     Dental Advisory Given  Plan Discussed with: Anesthesiologist, CRNA and Surgeon  Anesthesia Plan Comments: (Patient consented for risks of anesthesia including but not limited to:  - adverse reactions to medications - damage to eyes, teeth, lips or other oral mucosa - nerve damage due to positioning  - sore throat or hoarseness - Damage to heart, brain, nerves, lungs, other parts of body or loss of life  Patient voiced understanding.)        Anesthesia Quick Evaluation

## 2019-09-24 NOTE — Transfer of Care (Signed)
Immediate Anesthesia Transfer of Care Note  Patient: Michelle Jenkins  Procedure(s) Performed: CATARACT EXTRACTION PHACO AND INTRAOCULAR LENS PLACEMENT ([facility]) RIGHT (Right Eye)  Patient Location: PACU  Anesthesia Type:MAC  Level of Consciousness: awake, alert  and patient cooperative  Airway & Oxygen Therapy: Patient Spontanous Breathing  Post-op Assessment: Report given to RN and Post -op Vital signs reviewed and stable  Post vital signs: Reviewed and stable  Last Vitals:  Vitals Value Taken Time  BP    Temp    Pulse    Resp    SpO2      Last Pain:  Vitals:   09/24/19 0906  TempSrc: Temporal  PainSc: 0-No pain         Complications: No complications documented.

## 2019-09-24 NOTE — Op Note (Signed)
PREOPERATIVE DIAGNOSIS:  Nuclear sclerotic cataract of the right eye.   POSTOPERATIVE DIAGNOSIS:  H25.11 Cataract   OPERATIVE PROCEDURE: Procedure(s): CATARACT EXTRACTION PHACO AND INTRAOCULAR LENS PLACEMENT (IOC) RIGHT   SURGEON:  Birder Robson, MD.   ANESTHESIA:  Anesthesiologist: Piscitello, Precious Haws, MD CRNA: Kelton Pillar, CRNA  1.      Managed anesthesia care. 2.      0.58ml of Shugarcaine was instilled in the eye following the paracentesis.   COMPLICATIONS:  None.   TECHNIQUE:   Stop and chop   DESCRIPTION OF PROCEDURE:  The patient was examined and consented in the preoperative holding area where the aforementioned topical anesthesia was applied to the right eye and then brought back to the Operating Room where the right eye was prepped and draped in the usual sterile ophthalmic fashion and a lid speculum was placed. A paracentesis was created with the side port blade and the anterior chamber was filled with viscoelastic. A near clear corneal incision was performed with the steel keratome. A continuous curvilinear capsulorrhexis was performed with a cystotome followed by the capsulorrhexis forceps. Hydrodissection and hydrodelineation were carried out with BSS on a blunt cannula. The lens was removed in a stop and chop  technique and the remaining cortical material was removed with the irrigation-aspiration handpiece. The capsular bag was inflated with viscoelastic and the Technis [id number]  lens was placed in the capsular bag without complication. The remaining viscoelastic was removed from the eye with the irrigation-aspiration handpiece. The wounds were hydrated. The anterior chamber was flushed with Miostat and the eye was inflated to physiologic pressure. 0.12ml of Vigamox was placed in the anterior chamber. The wounds were found to be water tight. The eye was dressed with Vigamox. The patient was given protective glasses to wear throughout the day and a shield with which  to sleep tonight. The patient was also given drops with which to begin a drop regimen today and will follow-up with me in one day. Implant Name Type Inv. Item Serial No. Manufacturer Lot No. LRB No. Used Action  LENS IOL DIOP 24.0 - [id number] Intraocular Lens LENS IOL DIOP 24.0 [id number]  Right 1 Implanted   Procedure(s) with comments: CATARACT EXTRACTION PHACO AND INTRAOCULAR LENS PLACEMENT (IOC) RIGHT (Right) - cde00:44.8 us6.96 [id number]  Electronically signed: Birder Robson 09/24/2019 10:24 AM

## 2019-09-25 ENCOUNTER — Encounter: Payer: Self-pay | Admitting: Ophthalmology

## 2019-10-11 ENCOUNTER — Other Ambulatory Visit: Payer: Self-pay | Admitting: Family Medicine

## 2019-10-11 NOTE — Telephone Encounter (Signed)
Requested Prescriptions  Pending Prescriptions Disp Refills  . furosemide (LASIX) 20 MG tablet [Pharmacy Med Name: FUROSEMIDE 20MG  TABLETS] 180 tablet 0    Sig: TAKE 1 TABLET(20 MG) BY MOUTH TWICE DAILY AS NEEDED     Cardiovascular:  Diuretics - Loop Failed - 10/11/2019  7:14 AM      Failed - Cr in normal range and within 360 days    Creatinine, Ser  Date Value Ref Range Status  07/08/2019 1.16 (H) 0.57 - 1.00 mg/dL Final         Passed - K in normal range and within 360 days    Potassium  Date Value Ref Range Status  07/08/2019 4.0 3.5 - 5.2 mmol/L Final         Passed - Ca in normal range and within 360 days    Calcium  Date Value Ref Range Status  07/08/2019 9.6 8.7 - 10.3 mg/dL Final         Passed - Na in normal range and within 360 days    Sodium  Date Value Ref Range Status  07/08/2019 142 134 - 144 mmol/L Final         Passed - Last BP in normal range    BP Readings from Last 1 Encounters:  09/24/19 (!) 116/53         Passed - Valid encounter within last 6 months    Recent Outpatient Visits          2 months ago Chest pain, unspecified type   [facility], [place]   3 months ago Primary pulmonary hypertension [facility])   [place], [place], [place]   4 months ago Exposure to COVID-19 virus   [facility], Lilia Argue, [place]   6 months ago [place], [place], [place]   9 months ago Atrial fibrillation, unspecified type [facility])   [facility] [place], [place]      Future Appointments            In 3 months [facility], Lilia Argue, [place], [place]   In 4 months  [employer], [place]

## 2019-10-12 ENCOUNTER — Other Ambulatory Visit: Payer: Self-pay | Admitting: Family Medicine

## 2019-10-12 NOTE — Telephone Encounter (Signed)
Requested Prescriptions  Pending Prescriptions Disp Refills   gabapentin (NEURONTIN) 300 MG capsule [Pharmacy Med Name: GABAPENTIN 300MG  CAPSULES] 180 capsule 0    Sig: TAKE 2 CAPSULES(600 MG) BY MOUTH AT BEDTIME AS NEEDED     Neurology: Anticonvulsants - gabapentin Passed - 10/12/2019 11:28 AM      Passed - Valid encounter within last 12 months    Recent Outpatient Visits          2 months ago Chest pain, unspecified type   [facility] [place], [place]   3 months ago Primary pulmonary hypertension [facility])   [place], [place], [place]   4 months ago Exposure to COVID-19 virus   [facility], [place], [place]   6 months ago [place], [place], [place]   9 months ago Atrial fibrillation, unspecified type [facility])   [facility], [place]      Future Appointments            In 3 months [facility], Lilia Argue, [place], [place]   In 4 months  [place], [place] 5 MG TABS tablet [employer] Med Name: ELIQUIS 5MG  TABLETS] 180 tablet 0    Sig: TAKE 1 TABLET(5 MG) BY MOUTH TWICE DAILY     Hematology:  Anticoagulants Failed - 10/12/2019 11:28 AM      Failed - HGB in normal range and within 360 days    Hemoglobin  Date Value Ref Range Status  07/08/2019 10.9 (L) 11.1 - 15.9 g/dL Final         Failed - HCT in normal range and within 360 days    Hematocrit  Date Value Ref Range Status  07/08/2019 33.1 (L) 34.0 - 46.6 % Final         Failed - Cr in normal range and within 360 days    Creatinine, Ser  Date Value Ref Range Status  07/08/2019 1.16 (H) 0.57 - 1.00 mg/dL Final         Passed - PLT in normal range and within 360 days    Platelets  Date Value Ref Range Status  07/08/2019 173 150 - 450 x10E3/uL Final         Passed - Valid encounter within last 12 months    Recent Outpatient Visits           2 months ago Chest pain, unspecified type   [facility], PA-C   3 months ago Primary pulmonary hypertension [facility])   [place], [place], [place]   4 months ago Exposure to COVID-19 virus   [facility], Lilia Argue, [place]   6 months ago [place], [place], [place]   9 months ago Atrial fibrillation, unspecified type [facility])   [facility], [place]      Future Appointments            In 3 months [facility], Lilia Argue, [place], [place]   In 4 months  [employer], [place]

## 2019-11-27 ENCOUNTER — Encounter: Payer: Self-pay | Admitting: Family Medicine

## 2019-11-27 ENCOUNTER — Other Ambulatory Visit: Payer: Self-pay

## 2019-11-27 ENCOUNTER — Ambulatory Visit (INDEPENDENT_AMBULATORY_CARE_PROVIDER_SITE_OTHER): Payer: Medicare Other | Admitting: Family Medicine

## 2019-11-27 VITALS — BP 136/80 | HR 67 | Temp 99.0°F | Wt 322.0 lb

## 2019-11-27 DIAGNOSIS — Z1231 Encounter for screening mammogram for malignant neoplasm of breast: Secondary | ICD-10-CM

## 2019-11-27 NOTE — Patient Instructions (Signed)
Please call at this number [facility]) to schedule your mammogram. [phone]

## 2019-11-27 NOTE — Progress Notes (Deleted)
   BP 136/80   Pulse 67   Temp 99 F (37.2 C) (Oral)   Wt (!) 322 lb (146.1 kg)   SpO2 94%   BMI 53.58 kg/m    Subjective:    Patient ID: Michelle Jenkins, female    DOB: 1945/09/29, 74 y.o.   MRN: [id number]  HPI: Michelle Jenkins is a 73 y.o. female  Chief Complaint  Patient presents with  . Follow-up     Relevant past medical, surgical, family and social history reviewed and updated as indicated. Interim medical history since our last visit reviewed. Allergies and medications reviewed and updated.  Review of Systems  Per HPI unless specifically indicated above     Objective:    BP 136/80   Pulse 67   Temp 99 F (37.2 C) (Oral)   Wt (!) 322 lb (146.1 kg)   SpO2 94%   BMI 53.58 kg/m   Wt Readings from Last 3 Encounters:  11/27/19 (!) 322 lb (146.1 kg)  07/22/19 (!) 329 lb (149.2 kg)  07/08/19 (!) 328 lb (148.8 kg)    Physical Exam  Results for orders placed or performed during the hospital encounter of 09/22/19  SARS CORONAVIRUS 2 (TAT 6-24 HRS) Nasopharyngeal Nasopharyngeal Swab   Specimen: Nasopharyngeal Swab  Result Value Ref Range   SARS Coronavirus 2 NEGATIVE NEGATIVE      Assessment & Plan:   Problem List Items Addressed This Visit    None    Visit Diagnoses    Encounter for screening mammogram for malignant neoplasm of breast    -  Primary   Relevant Orders   MM DIGITAL SCREENING BILATERAL       Follow up plan: No follow-ups on file.

## 2019-11-27 NOTE — Progress Notes (Signed)
Pt about 6 weeks early for 6 month f/u. Appt made in error by home nursing provider through her insurance, possibly because of her prediabetic level A1C they received but this is stable from last visit and can be rechecked at her upcoming f/u next month. Visit today to be no charged.

## 2019-12-10 ENCOUNTER — Ambulatory Visit (INDEPENDENT_AMBULATORY_CARE_PROVIDER_SITE_OTHER): Payer: Medicare Other | Admitting: Unknown Physician Specialty

## 2019-12-10 ENCOUNTER — Encounter: Payer: Self-pay | Admitting: Family Medicine

## 2019-12-10 ENCOUNTER — Other Ambulatory Visit: Payer: Self-pay

## 2019-12-10 VITALS — BP 124/77 | HR 77 | Temp 99.4°F | Wt 329.0 lb

## 2019-12-10 DIAGNOSIS — L02412 Cutaneous abscess of left axilla: Secondary | ICD-10-CM | POA: Diagnosis not present

## 2019-12-10 NOTE — Patient Instructions (Addendum)
May continue to ooze.  Cover with gauze.  Return to clinic if not improving.

## 2019-12-10 NOTE — Progress Notes (Signed)
BP 124/77   Pulse 77   Temp 99.4 F (37.4 C) (Oral)   Wt (!) 329 lb (149.2 kg)   SpO2 96%   BMI 54.75 kg/m    Subjective:    Patient ID: Michelle Jenkins, female    DOB: 1946/03/10, 74 y.o.   MRN: [id number]  HPI: Michelle Jenkins is a 74 y.o. female  Chief Complaint  Patient presents with  . Cyst    pt states has had a knot under left arm, firts noticed about 4 days ago   Abscess: Patient presents for evaluation of a cutaneous abscess. Lesion is located in the left axilla. Onset was 2 week ago. Symptoms have gradually worsened. Abscess has associated symptoms of spontaneous drainage. Patient does not have previous history of cutaneous abscesses. Patient does not have diabetes.   Relevant past medical, surgical, family and social history reviewed and updated as indicated. Interim medical history since our last visit reviewed. Allergies and medications reviewed and updated.  Review of Systems  Per HPI unless specifically indicated above     Objective:    BP 124/77   Pulse 77   Temp 99.4 F (37.4 C) (Oral)   Wt (!) 329 lb (149.2 kg)   SpO2 96%   BMI 54.75 kg/m   Wt Readings from Last 3 Encounters:  12/10/19 (!) 329 lb (149.2 kg)  11/27/19 (!) 322 lb (146.1 kg)  07/22/19 (!) 329 lb (149.2 kg)    Physical Exam Constitutional:      General: She is not in acute distress.    Appearance: Normal appearance. She is well-developed.  HENT:     Head: Normocephalic and atraumatic.  Eyes:     General: Lids are normal. No scleral icterus.       Right eye: No discharge.        Left eye: No discharge.     Conjunctiva/sclera: Conjunctivae normal.  Cardiovascular:     Rate and Rhythm: Normal rate.  Pulmonary:     Effort: Pulmonary effort is normal.     Comments: On O2 Abdominal:     Palpations: There is no hepatomegaly or splenomegaly.  Musculoskeletal:        General: Normal range of motion.  Skin:    Coloration: Skin is not pale.     Findings: No rash.      Comments: Under left axilla, inflamed nodule with pustular drainage.    Neurological:     Mental Status: She is alert and oriented to person, place, and time.  Psychiatric:        Behavior: Behavior normal.        Thought Content: Thought content normal.        Judgment: Judgment normal.    After using a betadine and alcohol preparation, area infiltrated with 1% Lidocaine with epinephrine.  Using a #11 blade, lesion lanced and small 1-3 mm incision made.  Expressed a small amount of purulent drainage.     Results for orders placed or performed during the hospital encounter of 09/22/19  SARS CORONAVIRUS 2 (TAT 6-24 HRS) Nasopharyngeal Nasopharyngeal Swab   Specimen: Nasopharyngeal Swab  Result Value Ref Range   SARS Coronavirus 2 NEGATIVE NEGATIVE      Assessment & Plan:   Problem List Items Addressed This Visit    None    Visit Diagnoses    Abscess of left axilla    -  Primary   I&D scant drainage.  Fluctuant area remained.  Will rx doxycycline  100 mg BID for 10 days       Follow up plan: Return if symptoms worsen or fail to improve.

## 2019-12-11 ENCOUNTER — Telehealth: Payer: Self-pay | Admitting: Family Medicine

## 2019-12-11 MED ORDER — DOXYCYCLINE HYCLATE 100 MG PO TABS: 100.0000 mg | ORAL_TABLET | Freq: Two times a day (BID) | ORAL | 0 refills | Status: DC

## 2019-12-11 NOTE — Telephone Encounter (Signed)
Called pt advised rx has been sent, pt verbalized understanding

## 2019-12-11 NOTE — Telephone Encounter (Signed)
Per [name]'s note from visit 8/26 "Will rx doxycycline 100 mg BID for 10 days"  Copied from [place] #[id number]. Topic: General - Other >> Dec 11, 2019 12:30 PM [name] wrote: Patient stated that Michelle Jenkins was suppose to call in medication for her for her boil under her arm and that she contacted the pharmacy and they dont have anything. Please advise

## 2019-12-11 NOTE — Telephone Encounter (Signed)
Rx sent 

## 2019-12-22 ENCOUNTER — Other Ambulatory Visit: Payer: Self-pay | Admitting: Family Medicine

## 2019-12-22 MED ORDER — GABAPENTIN 300 MG PO CAPS: ORAL_CAPSULE | ORAL | 0 refills | Status: DC

## 2019-12-22 NOTE — Telephone Encounter (Signed)
Pt has an appt with jolene on 01-13-2020. Pt needs a refill on gabapentin #90 [facility] [place] [address]. Pt has called her pharm

## 2019-12-23 ENCOUNTER — Ambulatory Visit
Admission: RE | Admit: 2019-12-23 | Discharge: 2019-12-23 | Disposition: A | Discharge status: Home / Self Care | Payer: Medicare Other | Source: Ambulatory Visit | Attending: Family Medicine | Admitting: Family Medicine

## 2019-12-23 ENCOUNTER — Other Ambulatory Visit: Payer: Self-pay

## 2019-12-23 DIAGNOSIS — Z1231 Encounter for screening mammogram for malignant neoplasm of breast: Secondary | ICD-10-CM

## 2020-01-03 ENCOUNTER — Other Ambulatory Visit: Payer: Self-pay | Admitting: Family Medicine

## 2020-01-03 NOTE — Telephone Encounter (Signed)
Requested Prescriptions  Pending Prescriptions Disp Refills   atorvastatin (LIPITOR) 20 MG tablet [Pharmacy Med Name: ATORVASTATIN 20MG  TABLETS] 90 tablet 3    Sig: TAKE 1 TABLET(20 MG) BY MOUTH DAILY     Cardiovascular:  Antilipid - Statins Failed - 01/03/2020 11:27 AM      Failed - Total Cholesterol in normal range and within 360 days    Cholesterol, Total  Date Value Ref Range Status  07/08/2019 211 (H) 100 - 199 mg/dL Final   Cholesterol Piccolo, Waived  Date Value Ref Range Status  09/05/2016 154 <200 mg/dL Final    Comment:                            Desirable                <200                         Borderline High      200- 239                         High                     >239          Failed - LDL in normal range and within 360 days    LDL Chol Calc (NIH)  Date Value Ref Range Status  07/08/2019 104 (H) 0 - 99 mg/dL Final         Passed - HDL in normal range and within 360 days    HDL  Date Value Ref Range Status  07/08/2019 97 >39 mg/dL Final         Passed - Triglycerides in normal range and within 360 days    Triglycerides  Date Value Ref Range Status  07/08/2019 53 0 - 149 mg/dL Final   Triglycerides Piccolo,Waived  Date Value Ref Range Status  09/05/2016 46 <150 mg/dL Final    Comment:                            Normal                   <150                         Borderline High     150 - 199                         High                200 - 499                         Very High                >499          Passed - Patient is not pregnant      Passed - Valid encounter within last 12 months    Recent Outpatient Visits          3 weeks ago Abscess of left axilla   [facility] Kathrine Haddock, NP   1 month ago Encounter for screening mammogram for malignant neoplasm of breast   [facility], [place]   5 months ago Chest pain, unspecified type   [place], [place],  [place]   5 months ago Primary pulmonary hypertension [facility])   [place], [place], [place]   7 months ago Exposure to [place], Lilia Argue, [place]      Future Appointments            In 1 week Cannady, Barbaraann Faster, NP [employer], [place]   In 1 month  [employer], [facility]

## 2020-01-09 ENCOUNTER — Other Ambulatory Visit: Payer: Self-pay | Admitting: Family Medicine

## 2020-01-09 NOTE — Telephone Encounter (Signed)
Requested Prescriptions  Pending Prescriptions Disp Refills   ezetimibe (ZETIA) 10 MG tablet [Pharmacy Med Name: EZETIMIBE 10MG  TABLETS] 90 tablet 1    Sig: TAKE 1 TABLET(10 MG) BY MOUTH DAILY     Cardiovascular:  Antilipid - Sterol Transport Inhibitors Failed - 01/09/2020  7:09 AM      Failed - Total Cholesterol in normal range and within 360 days    Cholesterol, Total  Date Value Ref Range Status  07/08/2019 211 (H) 100 - 199 mg/dL Final   Cholesterol Piccolo, Waived  Date Value Ref Range Status  09/05/2016 154 <200 mg/dL Final    Comment:                            Desirable                <200                         Borderline High      200- 239                         High                     >239          Failed - LDL in normal range and within 360 days    LDL Chol Calc (NIH)  Date Value Ref Range Status  07/08/2019 104 (H) 0 - 99 mg/dL Final         Passed - HDL in normal range and within 360 days    HDL  Date Value Ref Range Status  07/08/2019 97 >39 mg/dL Final         Passed - Triglycerides in normal range and within 360 days    Triglycerides  Date Value Ref Range Status  07/08/2019 53 0 - 149 mg/dL Final   Triglycerides Piccolo,Waived  Date Value Ref Range Status  09/05/2016 46 <150 mg/dL Final    Comment:                            Normal                   <150                         Borderline High     150 - 199                         High                200 - 499                         Very High                >499          Passed - Valid encounter within last 12 months    Recent Outpatient Visits          1 month ago Abscess of left axilla   [facility] Kathrine Haddock, NP   1 month ago Encounter for screening mammogram for malignant neoplasm of breast   [facility], [place]   5  months ago Chest pain, unspecified type   [facility] Merrie Roof [place], [place]   6 months ago Primary  pulmonary hypertension [facility])   [place], [place], [place]   7 months ago Exposure to COVID-19 virus   [facility], Lilia Arg[place], [place]      Future Appointments            In 4 days Cannady, Barbaraann Faster, NP [employer], [facility]   In 1 month  [employer], [facility]            olmesartan-hydrochlorothiazide (BENICAR HCT) 20-12.5 MG tablet [employer] Med Name: OLMESARTAN MEDOX/HCTZ 20-12.5MG  TAB] 90 tablet 1    Sig: TAKE 1 TABLET BY MOUTH DAILY     Cardiovascular: ARB + Diuretic Combos Failed - 01/09/2020  7:09 AM      Failed - K in normal range and within 180 days    Potassium  Date Value Ref Range Status  07/08/2019 4.0 3.5 - 5.2 mmol/L Final         Failed - Na in normal range and within 180 days    Sodium  Date Value Ref Range Status  07/08/2019 142 134 - 144 mmol/L Final         Failed - Cr in normal range and within 180 days    Creatinine, Ser  Date Value Ref Range Status  07/08/2019 1.16 (H) 0.57 - 1.00 mg/dL Final         Failed - Ca in normal range and within 180 days    Calcium  Date Value Ref Range Status  07/08/2019 9.6 8.7 - 10.3 mg/dL Final         Passed - Patient is not pregnant      Passed - Last BP in normal range    BP Readings from Last 1 Encounters:  12/10/19 124/77         Passed - Valid encounter within last 6 months    Recent Outpatient Visits          1 month ago Abscess of left axilla   [facility] Kathrine Haddock, NP   1 month ago Encounter for screening mammogram for malignant neoplasm of breast   [facility], [place]   5 months ago Chest pain, unspecified type   [facility], [place]   6 months ago Primary pulmonary hypertension [facility])   [place], [place], [place]   7 months ago Exposure to COVID-19 virus   [facility], Lilia Argue, PA-C       Future Appointments            In 4 days Cannady, Barbaraann Faster, NP [employer], [place]   In 1 month  [facility], [facility]            furosemide (LASIX) 20 MG tablet [Pharmacy Med Name: FUROSEMIDE 20MG  TABLETS] 180 tablet 0    Sig: TAKE 1 TABLET(20 MG) BY MOUTH TWICE DAILY AS NEEDED     Cardiovascular:  Diuretics - Loop Failed - 01/09/2020  7:09 AM      Failed - Cr in normal range and within 360 days    Creatinine, Ser  Date Value Ref Range Status  07/08/2019 1.16 (H) 0.57 - 1.00 mg/dL Final         Passed - K in normal range and within 360 days    Potassium  Date  Value Ref Range Status  07/08/2019 4.0 3.5 - 5.2 mmol/L Final         Passed - Ca in normal range and within 360 days    Calcium  Date Value Ref Range Status  07/08/2019 9.6 8.7 - 10.3 mg/dL Final         Passed - Na in normal range and within 360 days    Sodium  Date Value Ref Range Status  07/08/2019 142 134 - 144 mmol/L Final         Passed - Last BP in normal range    BP Readings from Last 1 Encounters:  12/10/19 124/77         Passed - Valid encounter within last 6 months    Recent Outpatient Visits          1 month ago Abscess of left axilla   [facility] Kathrine Haddock, NP   1 month ago Encounter for screening mammogram for malignant neoplasm of breast   [place], [place], [place]   5 months ago Chest pain, unspecified type   [facility], Lilia Argue, [place]   6 months ago Primary pulmonary hypertension [facility])   [place], [place], [place]   7 months ago Exposure to COVID-19 virus   [facility], Lilia Argue, PA-C      Future Appointments            In 4 days Cannady, Barbaraann Faster, NP [employer], [place]   In 1 month  [place], [facility]            pantoprazole ([place]) 40 MG tablet [Pharmacy Med Name: PANTOPRAZOLE 40MG  TABLETS] 90 tablet 1     Sig: TAKE 1 TABLET(40 MG) BY MOUTH DAILY     Gastroenterology: Proton Pump Inhibitors Passed - 01/09/2020  7:09 AM      Passed - Valid encounter within last 12 months    Recent Outpatient Visits          1 month ago Abscess of left axilla   [facility] Kathrine Haddock, NP   1 month ago Encounter for screening mammogram for malignant neoplasm of breast   [place], [place], [place]   5 months ago Chest pain, unspecified type   [facility], Lilia Argue, [place]   6 months ago Primary pulmonary hypertension [facility])   [facility] Merrie Roof [place], [place]   7 months ago Exposure to COVID-19 virus   [facility], Lilia Argue, PA-C      Future Appointments            In 4 days Cannady, Barbaraann Faster, NP [employer], [place]   In 1 month  [place], [facility]            labetalol (NORMODYNE) 200 MG tablet [Pharmacy Med Name: LABETALOL 200MG  TABLETS] 180 tablet 1    Sig: TAKE 1 TABLET(200 MG) BY MOUTH TWICE DAILY     Cardiovascular:  Beta Blockers Passed - 01/09/2020  7:09 AM      Passed - Last BP in normal range    BP Readings from Last 1 Encounters:  12/10/19 124/77         Passed - Last Heart Rate in normal range    Pulse Readings from Last 1 Encounters:  12/10/19 77         Passed - Valid encounter within last  6 months    Recent Outpatient Visits          1 month ago Abscess of left axilla   [facility] Kathrine Haddock, NP   1 month ago Encounter for screening mammogram for malignant neoplasm of breast   [place], [place]   5 months ago Chest pain, unspecified type   [facility] Merrie Roof [place], [place]   6 months ago Primary pulmonary hypertension [facility])   [place], [place], [place]   7 months ago Exposure to COVID-19 virus   [facility], Lilia Argue, PA-C      Future Appointments            In 4 days Cannady, Barbaraann Faster, NP [employer], [place]   In 1 month  [employer], [facility]

## 2020-01-13 ENCOUNTER — Ambulatory Visit: Payer: Medicare Other | Admitting: Family Medicine

## 2020-01-13 ENCOUNTER — Ambulatory Visit: Payer: Medicare Other | Admitting: Nurse Practitioner

## 2020-01-21 ENCOUNTER — Ambulatory Visit: Payer: Medicare Other | Admitting: Unknown Physician Specialty

## 2020-01-28 ENCOUNTER — Ambulatory Visit (INDEPENDENT_AMBULATORY_CARE_PROVIDER_SITE_OTHER): Payer: Medicare Other | Admitting: Unknown Physician Specialty

## 2020-01-28 ENCOUNTER — Other Ambulatory Visit: Payer: Self-pay

## 2020-01-28 ENCOUNTER — Encounter: Payer: Self-pay | Admitting: Unknown Physician Specialty

## 2020-01-28 VITALS — BP 106/58 | HR 72 | Temp 98.6°F | Resp 16 | Wt 320.0 lb

## 2020-01-28 DIAGNOSIS — I2510 Atherosclerotic heart disease of native coronary artery without angina pectoris: Secondary | ICD-10-CM

## 2020-01-28 DIAGNOSIS — I509 Heart failure, unspecified: Secondary | ICD-10-CM

## 2020-01-28 DIAGNOSIS — I10 Essential (primary) hypertension: Secondary | ICD-10-CM

## 2020-01-28 DIAGNOSIS — E7800 Pure hypercholesterolemia, unspecified: Secondary | ICD-10-CM | POA: Diagnosis not present

## 2020-01-28 DIAGNOSIS — I270 Primary pulmonary hypertension: Secondary | ICD-10-CM

## 2020-01-28 NOTE — Assessment & Plan Note (Signed)
BP controlled well.  A little lower today but no dizzyness or fatigue

## 2020-01-28 NOTE — Progress Notes (Signed)
BP (!) 106/58 (BP Location: Left Arm, Patient Position: Sitting, Cuff Size: Large)   Pulse 72   Temp 98.6 F (37 C) (Oral)   Resp 16   Wt (!) 320 lb (145.2 kg)   SpO2 93%   BMI 53.25 kg/m    Subjective:    Patient ID: Michelle Jenkins, female    DOB: [birth year], 74 y.o.   MRN: [id number]  HPI: Michelle Jenkins is a 74 y.o. female  Chief Complaint  Patient presents with  . Hypertension  . Hyperlipidemia  . COPD   She was interested in getting her vaccine booster.    Hypertension Using medications without difficulty Average home BPs Not checking  No problems or lightheadedness No chest pain with exertion or shortness of breath No Edema   Hyperlipidemia Using medications without problems: No Muscle aches  Diet compliance: Exercise:  Pulmonary hypertension On 3 units of O2.  Sees pulmonology for management  Relevant past medical, surgical, family and social history reviewed and updated as indicated. Interim medical history since our last visit reviewed. Allergies and medications reviewed and updated.  Review of Systems  Per HPI unless specifically indicated above     Objective:    BP (!) 106/58 (BP Location: Left Arm, Patient Position: Sitting, Cuff Size: Large)   Pulse 72   Temp 98.6 F (37 C) (Oral)   Resp 16   Wt (!) 320 lb (145.2 kg)   SpO2 93%   BMI 53.25 kg/m   Wt Readings from Last 3 Encounters:  01/28/20 (!) 320 lb (145.2 kg)  12/10/19 (!) 329 lb (149.2 kg)  11/27/19 (!) 322 lb (146.1 kg)    Physical Exam Constitutional:      General: She is not in acute distress.    Appearance: Normal appearance. She is well-developed.  HENT:     Head: Normocephalic and atraumatic.  Eyes:     General: Lids are normal. No scleral icterus.       Right eye: No discharge.        Left eye: No discharge.     Conjunctiva/sclera: Conjunctivae normal.  Neck:     Vascular: No carotid bruit or JVD.  Cardiovascular:     Rate and Rhythm: Normal rate and regular  rhythm.     Heart sounds: Normal heart sounds.  Pulmonary:     Effort: Pulmonary effort is normal.     Breath sounds: Normal breath sounds.  Abdominal:     Palpations: There is no hepatomegaly or splenomegaly.  Musculoskeletal:        General: Normal range of motion.     Cervical back: Normal range of motion and neck supple.  Skin:    General: Skin is warm and dry.     Coloration: Skin is not pale.     Findings: No rash.  Neurological:     Mental Status: She is alert and oriented to person, place, and time.  Psychiatric:        Behavior: Behavior normal.        Thought Content: Thought content normal.        Judgment: Judgment normal.     Results for orders placed or performed during the hospital encounter of 09/22/19  SARS CORONAVIRUS 2 (TAT 6-24 HRS) Nasopharyngeal Nasopharyngeal Swab   Specimen: Nasopharyngeal Swab  Result Value Ref Range   SARS Coronavirus 2 NEGATIVE NEGATIVE      Assessment & Plan:   Problem List Items Addressed This Visit  Unprioritized   CAD in native artery    Continue Eliquis and Atorvastatin      CHF (congestive heart failure) ([facility])    Euvolemic today.  No new SOB or edema.  Lungs clear to auscultation      Hypercholesteremia    Check cholesterol.  Will continue present medications.        Relevant Orders   CBC with Differential/Platelet   Comprehensive metabolic panel   Lipid Panel w/o Chol/HDL Ratio   Hypertension - Primary    BP controlled well.  A little lower today but no dizzyness or fatigue      Relevant Orders   CBC with Differential/Platelet   Comprehensive metabolic panel   Lipid Panel w/o Chol/HDL Ratio   Primary pulmonary hypertension ([facility])    Stable at 3L.  Seeing pulmonary           Follow up plan: Return in about 6 months (around 07/28/2020).

## 2020-01-28 NOTE — Assessment & Plan Note (Addendum)
Continue Eliquis and Atorvastatin

## 2020-01-28 NOTE — Assessment & Plan Note (Signed)
Check cholesterol.  Will continue present medications.

## 2020-01-28 NOTE — Assessment & Plan Note (Signed)
Euvolemic today.  No new SOB or edema.  Lungs clear to auscultation

## 2020-01-28 NOTE — Assessment & Plan Note (Signed)
Stable at 3L.  Seeing pulmonary

## 2020-01-29 LAB — CBC WITH DIFFERENTIAL/PLATELET
Basophils Absolute: 0 10*3/uL (ref 0.0–0.2)
Basos: 1 %
EOS (ABSOLUTE): 0.2 10*3/uL (ref 0.0–0.4)
Eos: 4 %
Hematocrit: 36.5 % (ref 34.0–46.6)
Hemoglobin: 11.8 g/dL (ref 11.1–15.9)
Immature Grans (Abs): 0 10*3/uL (ref 0.0–0.1)
Immature Granulocytes: 1 %
Lymphocytes Absolute: 1.7 10*3/uL (ref 0.7–3.1)
Lymphs: 33 %
MCH: 31.5 pg (ref 26.6–33.0)
MCHC: 32.3 g/dL (ref 31.5–35.7)
MCV: 97 fL (ref 79–97)
Monocytes Absolute: 0.6 10*3/uL (ref 0.1–0.9)
Monocytes: 11 %
Neutrophils Absolute: 2.6 10*3/uL (ref 1.4–7.0)
Neutrophils: 50 %
Platelets: 171 10*3/uL (ref 150–450)
RBC: 3.75 x10E6/uL — ABNORMAL LOW (ref 3.77–5.28)
RDW: 13.8 % (ref 11.7–15.4)
WBC: 5.1 10*3/uL (ref 3.4–10.8)

## 2020-01-29 LAB — COMPREHENSIVE METABOLIC PANEL
ALT: 14 IU/L (ref 0–32)
AST: 27 IU/L (ref 0–40)
Albumin/Globulin Ratio: 1.7 (ref 1.2–2.2)
Albumin: 4.5 g/dL (ref 3.7–4.7)
Alkaline Phosphatase: 70 IU/L (ref 44–121)
BUN/Creatinine Ratio: 16 (ref 12–28)
BUN: 18 mg/dL (ref 8–27)
Bilirubin Total: 0.5 mg/dL (ref 0.0–1.2)
CO2: 22 mmol/L (ref 20–29)
Calcium: 9.5 mg/dL (ref 8.7–10.3)
Chloride: 104 mmol/L (ref 96–106)
Creatinine, Ser: 1.16 mg/dL — ABNORMAL HIGH (ref 0.57–1.00)
GFR calc Af Amer: 53 mL/min/{1.73_m2} — ABNORMAL LOW (ref >59)
GFR calc non Af Amer: 46 mL/min/{1.73_m2} — ABNORMAL LOW (ref >59)
Globulin, Total: 2.6 g/dL (ref 1.5–4.5)
Glucose: 127 mg/dL — ABNORMAL HIGH (ref 65–99)
Sodium: 145 mmol/L — ABNORMAL HIGH (ref 134–144)
Total Protein: 7.1 g/dL (ref 6.0–8.5)

## 2020-01-29 LAB — LIPID PANEL W/O CHOL/HDL RATIO
Cholesterol, Total: 165 mg/dL (ref 100–199)
HDL: 79 mg/dL (ref >39)
LDL Chol Calc (NIH): 75 mg/dL (ref 0–99)
Triglycerides: 56 mg/dL (ref 0–149)
VLDL Cholesterol Cal: 11 mg/dL (ref 5–40)

## 2020-02-08 ENCOUNTER — Telehealth: Payer: Self-pay | Admitting: Family Medicine

## 2020-02-08 NOTE — Telephone Encounter (Signed)
Copied from [place] [phone]. Topic: Medicare AWV >> Feb 08, 2020 11:06 AM Cher Nakai R wrote: Reason for CRM:  No answer unable to leave message  Need to reschedule appointment on February 17, 2020 to another day. Appointment will be by phone not in the office

## 2020-02-17 ENCOUNTER — Ambulatory Visit: Payer: Medicare Other

## 2020-02-19 ENCOUNTER — Ambulatory Visit (INDEPENDENT_AMBULATORY_CARE_PROVIDER_SITE_OTHER): Payer: Medicare Other

## 2020-02-19 VITALS — Ht 65.0 in | Wt 320.0 lb

## 2020-02-19 DIAGNOSIS — Z0000 Encounter for general adult medical examination without abnormal findings: Secondary | ICD-10-CM | POA: Diagnosis not present

## 2020-02-19 NOTE — Patient Instructions (Signed)
Ms. Masser , Thank you for taking time to come for your Medicare Wellness Visit. I appreciate your ongoing commitment to your health goals. Please review the following plan we discussed and let me know if I can assist you in the future.   Screening recommendations/referrals: Colonoscopy: cologuard 05/28/2018, due 05/28/2021 Mammogram: completed 12/23/2019, due 12/22/2020 Bone Density: completed 01/13/2015 Recommended yearly ophthalmology/optometry visit for glaucoma screening and checkup Recommended yearly dental visit for hygiene and checkup  Vaccinations: Influenza vaccine: will get later Pneumococcal vaccine: completed 01/16/2018 Tdap vaccine: due Shingles vaccine: completed   Covid-19: 07/08/2019, 06/12/2019  Advanced directives: Advance directive discussed with you today. Even though you declined this today please call our office should you change your mind and we can give you the proper paperwork for you to fill out.  Conditions/risks identified: none  Next appointment: Follow up in one year for your annual wellness visit    Preventive Care 65 Years and Older, Female Preventive care refers to lifestyle choices and visits with your health care provider that can promote health and wellness. What does preventive care include?  A yearly physical exam. This is also called an annual well check.  Dental exams once or twice a year.  Routine eye exams. Ask your health care provider how often you should have your eyes checked.  Personal lifestyle choices, including:  Daily care of your teeth and gums.  Regular physical activity.  Eating a healthy diet.  Avoiding tobacco and drug use.  Limiting alcohol use.  Practicing safe sex.  Taking low-dose aspirin every day.  Taking vitamin and mineral supplements as recommended by your health care provider. What happens during an annual well check? The services and screenings done by your health care provider during your annual well check  will depend on your age, overall health, lifestyle risk factors, and family history of disease. Counseling  Your health care provider may ask you questions about your:  Alcohol use.  Tobacco use.  Drug use.  Emotional well-being.  Home and relationship well-being.  Sexual activity.  Eating habits.  History of falls.  Memory and ability to understand (cognition).  Work and work Statistician.  Reproductive health. Screening  You may have the following tests or measurements:  Height, weight, and BMI.  Blood pressure.  Lipid and cholesterol levels. These may be checked every 5 years, or more frequently if you are over 26 years old.  Skin check.  Lung cancer screening. You may have this screening every year starting at age 20 if you have a 30-pack-year history of smoking and currently smoke or have quit within the past 15 years.  Fecal occult blood test (FOBT) of the stool. You may have this test every year starting at age 1.  Flexible sigmoidoscopy or colonoscopy. You may have a sigmoidoscopy every 5 years or a colonoscopy every 10 years starting at age 64.  Hepatitis C blood test.  Hepatitis B blood test.  Sexually transmitted disease (STD) testing.  Diabetes screening. This is done by checking your blood sugar (glucose) after you have not eaten for a while (fasting). You may have this done every 1-3 years.  Bone density scan. This is done to screen for osteoporosis. You may have this done starting at age 32.  Mammogram. This may be done every 1-2 years. Talk to your health care provider about how often you should have regular mammograms. Talk with your health care provider about your test results, treatment options, and if necessary, the need for more  tests. Vaccines  Your health care provider may recommend certain vaccines, such as:  Influenza vaccine. This is recommended every year.  Tetanus, diphtheria, and acellular pertussis (Tdap, Td) vaccine. You may  need a Td booster every 10 years.  Zoster vaccine. You may need this after age 48.  Pneumococcal 13-valent conjugate (PCV13) vaccine. One dose is recommended after age 42.  Pneumococcal polysaccharide (PPSV23) vaccine. One dose is recommended after age 24. Talk to your health care provider about which screenings and vaccines you need and how often you need them. This information is not intended to replace advice given to you by your health care provider. Make sure you discuss any questions you have with your health care provider. Document Released: 04/29/2015 Document Revised: 12/21/2015 Document Reviewed: 02/01/2015 Elsevier Interactive Patient Education  2017 [employer] Prevention in the Home Falls can cause injuries. They can happen to people of all ages. There are many things you can do to make your home safe and to help prevent falls. What can I do on the outside of my home?  Regularly fix the edges of walkways and driveways and fix any cracks.  Remove anything that might make you trip as you walk through a door, such as a raised step or threshold.  Trim any bushes or trees on the path to your home.  Use bright outdoor lighting.  Clear any walking paths of anything that might make someone trip, such as rocks or tools.  Regularly check to see if handrails are loose or broken. Make sure that both sides of any steps have handrails.  Any raised decks and porches should have guardrails on the edges.  Have any leaves, snow, or ice cleared regularly.  Use sand or salt on walking paths during winter.  Clean up any spills in your garage right away. This includes oil or grease spills. What can I do in the bathroom?  Use night lights.  Install grab bars by the toilet and in the tub and shower. Do not use towel bars as grab bars.  Use non-skid mats or decals in the tub or shower.  If you need to sit down in the shower, use a plastic, non-slip stool.  Keep the floor  dry. Clean up any water that spills on the floor as soon as it happens.  Remove soap buildup in the tub or shower regularly.  Attach bath mats securely with double-sided non-slip rug tape.  Do not have throw rugs and other things on the floor that can make you trip. What can I do in the bedroom?  Use night lights.  Make sure that you have a light by your bed that is easy to reach.  Do not use any sheets or blankets that are too big for your bed. They should not hang down onto the floor.  Have a firm chair that has side arms. You can use this for support while you get dressed.  Do not have throw rugs and other things on the floor that can make you trip. What can I do in the kitchen?  Clean up any spills right away.  Avoid walking on wet floors.  Keep items that you use a lot in easy-to-reach places.  If you need to reach something above you, use a strong step stool that has a grab bar.  Keep electrical cords out of the way.  Do not use floor polish or wax that makes floors slippery. If you must use wax, use non-skid floor  wax.  Do not have throw rugs and other things on the floor that can make you trip. What can I do with my stairs?  Do not leave any items on the stairs.  Make sure that there are handrails on both sides of the stairs and use them. Fix handrails that are broken or loose. Make sure that handrails are as long as the stairways.  Check any carpeting to make sure that it is firmly attached to the stairs. Fix any carpet that is loose or worn.  Avoid having throw rugs at the top or bottom of the stairs. If you do have throw rugs, attach them to the floor with carpet tape.  Make sure that you have a light switch at the top of the stairs and the bottom of the stairs. If you do not have them, ask someone to add them for you. What else can I do to help prevent falls?  Wear shoes that:  Do not have high heels.  Have rubber bottoms.  Are comfortable and fit you  well.  Are closed at the toe. Do not wear sandals.  If you use a stepladder:  Make sure that it is fully opened. Do not climb a closed stepladder.  Make sure that both sides of the stepladder are locked into place.  Ask someone to hold it for you, if possible.  Clearly mark and make sure that you can see:  Any grab bars or handrails.  First and last steps.  Where the edge of each step is.  Use tools that help you move around (mobility aids) if they are needed. These include:  Canes.  Walkers.  Scooters.  Crutches.  Turn on the lights when you go into a dark area. Replace any light bulbs as soon as they burn out.  Set up your furniture so you have a clear path. Avoid moving your furniture around.  If any of your floors are uneven, fix them.  If there are any pets around you, be aware of where they are.  Review your medicines with your doctor. Some medicines can make you feel dizzy. This can increase your chance of falling. Ask your doctor what other things that you can do to help prevent falls. This information is not intended to replace advice given to you by your health care provider. Make sure you discuss any questions you have with your health care provider. Document Released: 01/27/2009 Document Revised: 09/08/2015 Document Reviewed: 05/07/2014 Elsevier Interactive Patient Education  2017 [employer].

## 2020-02-19 NOTE — Progress Notes (Signed)
I connected with Michelle Jenkins today by telephone and verified that I am speaking with the correct person using two identifiers. Location patient: home Location provider: work Persons participating in the virtual visit: Walker Staples, Glenna Durand LPN.   I discussed the limitations, risks, security and privacy concerns of performing an evaluation and management service by telephone and the availability of in person appointments. I also discussed with the patient that there may be a patient responsible charge related to this service. The patient expressed understanding and verbally consented to this telephonic visit.    Interactive audio and video telecommunications were attempted between this provider and patient, however failed, due to patient having technical difficulties OR patient did not have access to video capability.  We continued and completed visit with audio only.     Vital signs may be patient reported or missing.  Subjective:   Michelle Jenkins is a 74 y.o. female who presents for Medicare Annual (Subsequent) preventive examination.  Review of Systems     Cardiac Risk Factors include: advanced age (>73men, >67 women);hypertension;obesity (BMI >30kg/m2);sedentary lifestyle     Objective:    Today's Vitals   02/19/20 1256  Weight: (!) 320 lb (145.2 kg)  Height: 5\' 5"  (1.651 m)   Body mass index is 53.25 kg/m.  Advanced Directives 02/19/2020 09/24/2019 02/12/2019 01/16/2018 12/13/2016 02/20/2016 12/07/2014  Does Patient Have a Medical Advance Directive? No No No No No No No  Would patient like information on creating a medical advance directive? - No - Patient declined - No - Patient declined Yes (MAU/Ambulatory/Procedural Areas - Information given) No - patient declined information No - patient declined information    Current Medications (verified) Outpatient Encounter Medications as of 02/19/2020  Medication Sig  . ambrisentan (LETAIRIS) 10 MG tablet Take 10 mg by mouth  daily.  Marland Kitchen atorvastatin (LIPITOR) 20 MG tablet TAKE 1 TABLET(20 MG) BY MOUTH DAILY  . doxycycline (VIBRA-TABS) 100 MG tablet Take 1 tablet (100 mg total) by mouth 2 (two) times daily.  Marland Kitchen ELIQUIS 5 MG TABS tablet TAKE 1 TABLET(5 MG) BY MOUTH TWICE DAILY  . ezetimibe (ZETIA) 10 MG tablet TAKE 1 TABLET(10 MG) BY MOUTH DAILY  . furosemide (LASIX) 20 MG tablet TAKE 1 TABLET(20 MG) BY MOUTH TWICE DAILY AS NEEDED  . gabapentin (NEURONTIN) 300 MG capsule TAKE 2 CAPSULES(600 MG) BY MOUTH AT BEDTIME AS NEEDED  . labetalol (NORMODYNE) 200 MG tablet TAKE 1 TABLET(200 MG) BY MOUTH TWICE DAILY  . olmesartan-hydrochlorothiazide (BENICAR HCT) 20-12.5 MG tablet TAKE 1 TABLET BY MOUTH DAILY  . Omega-3 Fatty Acids (FISH OIL PO) Take 1 Dose by mouth daily.  . pantoprazole (PROTONIX) 40 MG tablet TAKE 1 TABLET(40 MG) BY MOUTH DAILY  . Psyllium (METAMUCIL FIBER PO) Take 1 Dose by mouth. Patient uses Metamucil Powder in the AM  . VENTOLIN HFA 108 (90 Base) MCG/ACT inhaler    Facility-Administered Encounter Medications as of 02/19/2020  Medication  . technetium tetrofosmin (TC-MYOVIEW) injection 30 millicurie    Allergies (verified) Ace inhibitors, Nyquil multi-symptom [pseudoeph-doxylamine-dm-apap], and Tylenol [acetaminophen]   History: Past Medical History:  Diagnosis Date  . Anginal pain ([employer])   . Asthma   . Bronchitis   . CAD (coronary artery disease)   . CHF (congestive heart failure) ([place])   . CVA (cerebral infarction)   . Dysrhythmia   . GERD (gastroesophageal reflux disease)   . History of chronic atrial fibrillation   . Hypercholesteremia   . Hypertension   . Idiopathic pulmonary  hypertension ([place])   . Myocardial infarction ([place])   . Obesity   . OSA (obstructive sleep apnea)   . Pre-diabetes   . Pulmonary artery hypertension ([place])   . Shortness of breath dyspnea   . Sleep apnea   . Stroke [facility])    Past Surgical History:  Procedure Laterality Date  . BREAST BIOPSY Right 11/15/2015    stereo, COMPLEX SCLEROSING LESION WITH INTRADUCTAL PAPILLOMA COMPONENT AND   . BREAST EXCISIONAL BIOPSY Right 02/27/2016   NEG  . BREAST LUMPECTOMY WITH NEEDLE LOCALIZATION Right 02/27/2016   Procedure: BREAST LUMPECTOMY WITH NEEDLE LOCALIZATION;  Surgeon: Christene Lye, MD;  Location: [facility] ORS;  Service: General;  Laterality: Right;  . CARDIAC CATHETERIZATION    . CARDIAC CATHETERIZATION Right 12/07/2014   Procedure: Right and Left Heart Cath with Coronary Angiography;  Surgeon: Teodoro Spray, MD;  Location: [facility];  Service: Cardiovascular;  Laterality: Right;  . [place]  and 1999  . CATARACT EXTRACTION W/PHACO Right 09/24/2019   Procedure: CATARACT EXTRACTION PHACO AND INTRAOCULAR LENS PLACEMENT ([place]) RIGHT;  Surgeon: Birder Robson, MD;  Location: [facility] ORS;  Service: Ophthalmology;  Laterality: Right;  [id number] [id number]  . COLONOSCOPY  10/31/1998  . DILATION AND CURETTAGE OF UTERUS    . FOOT SURGERY Right   . HYSTEROSCOPY    . JOINT REPLACEMENT Left   . KNEE ARTHROSCOPY AND ARTHROTOMY Right   . lt knee replacement    . TONSILLECTOMY    . UVULOPALATOPHARYNGOPLASTY     Family History  Problem Relation Age of Onset  . Diabetes Mother   . Alzheimer's disease Mother   . Throat cancer Brother   . Diabetes Brother   . Cancer Brother        lung  . Hypertension Brother   . Cancer Daughter 54       metastatic uterine PECOMA to the liver and lungs  . Rheumatic fever Daughter   . Diabetes Sister   . Hypertension Sister   . COPD Neg Hx   . Heart disease Neg Hx   . Stroke Neg Hx   . Breast cancer Neg Hx    Social History   Socioeconomic History  . Marital status: Single    Spouse name: Not on file  . Number of children: Not on file  . Years of education: Not on file  . Highest education level: 8th grade  Occupational History  . Occupation: retired  Tobacco Use  . Smoking status: Former Smoker    Packs/day: 0.25     Types: Cigarettes    Quit date: 06/15/2014    Years since quitting: 5.6  . Smokeless tobacco: Never Used  . Tobacco comment: 4-5 cigarettes a day when she smoked  Vaping Use  . Vaping Use: Never used  Substance and Sexual Activity  . Alcohol use: Yes    Alcohol/week: 0.0 - 1.0 standard drinks    Comment: occasionally  . Drug use: No  . Sexual activity: Not on file  Other Topics Concern  . Not on file  Social History Narrative  . Not on file   Social Determinants of Health   Financial Resource Strain: Low Risk   . Difficulty of Paying Living Expenses: Not hard at all  Food Insecurity: No Food Insecurity  . Worried About Charity fundraiser in the Last Year: Never true  . Ran Out of Food in the Last Year: Never true  Transportation Needs: No Transportation  Needs  . Lack of Transportation (Medical): No  . Lack of Transportation (Non-Medical): No  Physical Activity: Inactive  . Days of Exercise per Week: 0 days  . Minutes of Exercise per Session: 0 min  Stress: No Stress Concern Present  . Feeling of Stress : Not at all  Social Connections:   . Frequency of Communication with Friends and Family: Not on file  . Frequency of Social Gatherings with Friends and Family: Not on file  . Attends Religious Services: Not on file  . Active Member of Clubs or Organizations: Not on file  . Attends Archivist Meetings: Not on file  . Marital Status: Not on file    Tobacco Counseling Counseling given: Not Answered Comment: 4-5 cigarettes a day when she smoked   Clinical Intake:  Pre-visit preparation completed: Yes  Pain : No/denies pain     Nutritional Status: BMI > 30  Obese Nutritional Risks: None Diabetes: No  How often do you need to have someone help you when you read instructions, pamphlets, or other written materials from your doctor or pharmacy?: 1 - Never What is the last grade level you completed in school?: 8th grade  Diabetic? no  Interpreter Needed?:  No  Information entered by :: NAllen LPN   Activities of Daily Living In your present state of health, do you have any difficulty performing the following activities: 02/19/2020  Hearing? N  Vision? Y  Comment blind in one eye  Difficulty concentrating or making decisions? N  Walking or climbing stairs? Y  Comment had knee replacement  Dressing or bathing? N  Doing errands, shopping? N  Preparing Food and eating ? N  Using the Toilet? N  In the past six months, have you accidently leaked urine? Y  Comment wears a pad  Do you have problems with loss of bowel control? N  Managing your Medications? N  Managing your Finances? N  Housekeeping or managing your Housekeeping? N  Some recent data might be hidden    Patient Care Team: Volney American, PA-C as PCP - General (Family Medicine) Albina Billet, MD (Internal Medicine) Christene Lye, MD (General Surgery)  Indicate any recent Medical Services you may have received from other than [facility] providers in the past year (date may be approximate).     Assessment:   This is a routine wellness examination for Kaydince.  Hearing/Vision screen  Hearing Screening   125Hz  250Hz  500Hz  1000Hz  2000Hz  3000Hz  4000Hz  6000Hz  8000Hz   Right ear:           Left ear:           Vision Screening Comments: No regular eye exams,  Dietary issues and exercise activities discussed: Current Exercise Habits: The patient does not participate in regular exercise at present  Goals    . DIET - INCREASE WATER INTAKE     Recommend drinking at least 6-8 glasses of water a day     . Patient Stated     02/19/2020, working on losing weight      Depression Screen PHQ 2/9 Scores 02/19/2020 02/12/2019 01/16/2018 12/12/2017 12/13/2016 09/05/2016  PHQ - 2 Score 0 0 4 0 2 0  PHQ- 9 Score - - 10 10 2  -    Fall Risk Fall Risk  02/19/2020 12/10/2019 01/16/2018 09/06/2017 03/20/2017  Falls in the past year? 0 0 No No No  Number falls in past yr: - 0 - - -    Injury with Fall? -  0 - - -  Risk for fall due to : Medication side effect;Impaired mobility - - - -  Follow up Falls evaluation completed;Education provided;Falls prevention discussed - - - -    Any stairs in or around the home? No  If so, are there any without handrails? n/a Home free of loose throw rugs in walkways, pet beds, electrical cords, etc? Yes  Adequate lighting in your home to reduce risk of falls? Yes   ASSISTIVE DEVICES UTILIZED TO PREVENT FALLS:  Life alert? Yes  Use of a cane, walker or w/c? Yes  Grab bars in the bathroom? Yes  Shower chair or bench in shower? No  Elevated toilet seat or a handicapped toilet? Yes   TIMED UP AND GO:  Was the test performed? No . .     Cognitive Function:     6CIT Screen 02/19/2020 01/16/2018 12/13/2016  What Year? 0 points 0 points 0 points  What month? 0 points 0 points 0 points  What time? 0 points 0 points 0 points  Count back from 20 0 points 0 points 0 points  Months in reverse 2 points 0 points 0 points  Repeat phrase 8 points 2 points 4 points  Total Score 10 2 4     Immunizations Immunization History  Administered Date(s) Administered  . Influenza, High Dose Seasonal PF 12/31/2017, 12/31/2017, 12/11/2018  . PFIZER SARS-COV-2 Vaccination 06/12/2019, 07/08/2019  . Pneumococcal Conjugate-13 01/16/2018  . Pneumococcal Polysaccharide-23 12/13/2016  . Td 05/11/1999  . Zoster Recombinat (Shingrix) 11/01/2017, 12/31/2017, 04/11/2018    TDAP status: Due, Education has been provided regarding the importance of this vaccine. Advised may receive this vaccine at local pharmacy or Health Dept. Aware to provide a copy of the vaccination record if obtained from local pharmacy or Health Dept. Verbalized acceptance and understanding. Flu Vaccine status: will be getting Pneumococcal vaccine status: Up to date Covid-19 vaccine status: Completed vaccines  Qualifies for Shingles Vaccine? Yes   Zostavax completed No   Shingrix  Completed?: Yes  Screening Tests Health Maintenance  Topic Date Due  . TETANUS/TDAP  05/10/2009  . INFLUENZA VACCINE  11/15/2019  . MAMMOGRAM  12/22/2020  . Fecal DNA (Cologuard)  05/28/2021  . DEXA SCAN  Completed  . COVID-19 Vaccine  Completed  . Hepatitis C Screening  Completed  . PNA vac Low Risk Adult  Completed    Health Maintenance  Health Maintenance Due  Topic Date Due  . TETANUS/TDAP  05/10/2009  . INFLUENZA VACCINE  11/15/2019    Colorectal cancer screening: Completed cologuard 05/28/2018. Repeat every 3 years Mammogram status: Completed 12/23/2019. Repeat every year Bone Density status: Completed 01/13/2015. Results reflect: Bone density results: NORMAL. Repeat every 0 years.  Lung Cancer Screening: (Low Dose CT Chest recommended if Age 90-80 years, 30 pack-year currently smoking OR have quit w/in 15years.) does not qualify.   Lung Cancer Screening Referral: no  Additional Screening:  Hepatitis C Screening: does qualify; Completed 12/13/2016  Vision Screening: Recommended annual ophthalmology exams for early detection of glaucoma and other disorders of the eye. Is the patient up to date with their annual eye exam?  Yes  Who is the provider or what is the name of the office in which the patient attends annual eye exams? No set practice If pt is not established with a provider, would they like to be referred to a provider to establish care? No .   Dental Screening: Recommended annual dental exams for proper oral hygiene  [employer]  Referral / Chronic Care Management: CRR required this visit?  No   CCM required this visit?  No      Plan:     I have personally reviewed and noted the following in the patient's chart:   . Medical and social history . Use of alcohol, tobacco or illicit drugs  . Current medications and supplements . Functional ability and status . Nutritional status . Physical activity . Advanced directives . List of other  physicians . Hospitalizations, surgeries, and ER visits in previous 12 months . Vitals . Screenings to include cognitive, depression, and falls . Referrals and appointments  In addition, I have reviewed and discussed with patient certain preventive protocols, quality metrics, and best practice recommendations. A written personalized care plan for preventive services as well as general preventive health recommendations were provided to patient.     Kellie Simmering, LPN   [id number]   Nurse Notes:

## 2020-04-05 ENCOUNTER — Telehealth: Payer: Self-pay

## 2020-04-05 NOTE — Telephone Encounter (Signed)
Looks like she saw Dr. Raul Del today I would advise her to reach out to their office for discussion on this.  Looks like they do want her to increase to lasix to 40 mg q day and 20 mg in pm.

## 2020-04-05 NOTE — Telephone Encounter (Signed)
CALLED PT BACK FROM LUNG DOCTOR AND WAS TOLD TO START TAKING ANOTHER FLUID PILL AND PT IS CONFUSED WITH HER MEDICATION.

## 2020-04-05 NOTE — Telephone Encounter (Signed)
Called and spoke with patient, she states that she has it straightened out.

## 2020-04-05 NOTE — Telephone Encounter (Signed)
Please advise 

## 2020-04-08 ENCOUNTER — Other Ambulatory Visit: Payer: Self-pay | Admitting: Nurse Practitioner

## 2020-04-10 NOTE — Telephone Encounter (Signed)
Requested Prescriptions  Pending Prescriptions Disp Refills  . furosemide (LASIX) 20 MG tablet [Pharmacy Med Name: FUROSEMIDE 20MG  TABLETS] 180 tablet 0    Sig: TAKE 1 TABLET(20 MG) BY MOUTH TWICE DAILY AS NEEDED     Cardiovascular:  Diuretics - Loop Failed - 04/08/2020  7:02 AM      Failed - Na in normal range and within 360 days    Sodium  Date Value Ref Range Status  01/28/2020 145 (H) 134 - 144 mmol/L Final         Failed - Cr in normal range and within 360 days    Creatinine, Ser  Date Value Ref Range Status  01/28/2020 1.16 (H) 0.57 - 1.00 mg/dL Final         Passed - K in normal range and within 360 days    Potassium  Date Value Ref Range Status  01/28/2020 CANCELED mmol/L     Comment:    Test not performed. Specimen is hemolyzed. Unable to obtain valid results.  Result canceled by the ancillary.          Passed - Ca in normal range and within 360 days    Calcium  Date Value Ref Range Status  01/28/2020 9.5 8.7 - 10.3 mg/dL Final         Passed - Last BP in normal range    BP Readings from Last 1 Encounters:  01/28/20 (!) 106/58         Passed - Valid encounter within last 6 months    Recent Outpatient Visits          2 months ago Primary hypertension   [facility] [place], Malachy Mood, NP   4 months ago Abscess of left axilla   [facility] Kathrine Haddock, NP   4 months ago Encounter for screening mammogram for malignant neoplasm of breast   [facility], [place], [place]   8 months ago Chest pain, unspecified type   [facility] Volney American, [place]   9 months ago Primary pulmonary hypertension [facility])   [place], Lilia Argue, [place]      Future Appointments            In 10 months [facility]

## 2020-04-28 ENCOUNTER — Other Ambulatory Visit: Payer: Self-pay | Admitting: Family Medicine

## 2020-05-08 ENCOUNTER — Other Ambulatory Visit: Payer: Self-pay | Admitting: Nurse Practitioner

## 2020-05-12 ENCOUNTER — Other Ambulatory Visit: Payer: Self-pay

## 2020-05-12 ENCOUNTER — Ambulatory Visit (INDEPENDENT_AMBULATORY_CARE_PROVIDER_SITE_OTHER): Payer: Medicare Other | Admitting: Unknown Physician Specialty

## 2020-05-12 ENCOUNTER — Encounter: Payer: Self-pay | Admitting: Unknown Physician Specialty

## 2020-05-12 VITALS — BP 148/63 | HR 77 | Temp 98.7°F | Wt 320.0 lb

## 2020-05-12 DIAGNOSIS — M792 Neuralgia and neuritis, unspecified: Secondary | ICD-10-CM | POA: Diagnosis not present

## 2020-05-12 DIAGNOSIS — R1031 Right lower quadrant pain: Secondary | ICD-10-CM

## 2020-05-12 NOTE — Progress Notes (Signed)
BP (!) 148/63   Pulse 77   Temp 98.7 F (37.1 C)   Wt (!) 320 lb (145.2 kg)   SpO2 94%   BMI 53.25 kg/m    Subjective:    Patient ID: Michelle Jenkins, female    DOB: 12-20-1945, 75 y.o.   MRN: [id number]  HPI: Michelle Jenkins is a 75 y.o. female  Chief Complaint  Patient presents with  . burning    At the site of the original shingles outbreak   Left sided flank burning at the site of a former shingles breakout.  Noticed 2 nights ago.  Comes and goes and hasn't had it since calling here.  No rash.  Had a similar 2 weeks ago and that resolved. States sometimes her right rib hurts while in bed.  That comes and goes as well.     Relevant past medical, surgical, family and social history reviewed and updated as indicated. Interim medical history since our last visit reviewed. Allergies and medications reviewed and updated.  Review of Systems  Per HPI unless specifically indicated above     Objective:    BP (!) 148/63   Pulse 77   Temp 98.7 F (37.1 C)   Wt (!) 320 lb (145.2 kg)   SpO2 94%   BMI 53.25 kg/m   Wt Readings from Last 3 Encounters:  05/12/20 (!) 320 lb (145.2 kg)  02/19/20 (!) 320 lb (145.2 kg)  01/28/20 (!) 320 lb (145.2 kg)    Physical Exam Constitutional:      General: She is not in acute distress.    Appearance: Normal appearance. She is well-developed and well-nourished.  HENT:     Head: Normocephalic and atraumatic.  Eyes:     General: Lids are normal. No scleral icterus.       Right eye: No discharge.        Left eye: No discharge.     Conjunctiva/sclera: Conjunctivae normal.  Neck:     Vascular: No carotid bruit or JVD.  Cardiovascular:     Rate and Rhythm: Normal rate and regular rhythm.     Heart sounds: Normal heart sounds.  Pulmonary:     Effort: Pulmonary effort is normal.     Breath sounds: Normal breath sounds.  Abdominal:     Palpations: There is no hepatomegaly or splenomegaly.  Musculoskeletal:        General: Normal  range of motion.     Cervical back: Normal range of motion and neck supple.  Skin:    General: Skin is warm, dry and intact.     Coloration: Skin is not pale.     Findings: No rash.  Neurological:     Mental Status: She is alert and oriented to person, place, and time.  Psychiatric:        Mood and Affect: Mood and affect normal.        Behavior: Behavior normal.        Thought Content: Thought content normal.        Judgment: Judgment normal.     Results for orders placed or performed in visit on 01/28/20  CBC with Differential/Platelet  Result Value Ref Range   WBC 5.1 3.4 - 10.8 x10E3/uL   RBC 3.75 (L) 3.77 - 5.28 x10E6/uL   Hemoglobin 11.8 11.1 - 15.9 g/dL   Hematocrit 36.5 34.0 - 46.6 %   MCV 97 79 - 97 fL   MCH 31.5 26.6 - 33.0 pg  MCHC 32.3 31.5 - 35.7 g/dL   RDW 13.8 11.7 - 15.4 %   Platelets 171 150 - 450 x10E3/uL   Neutrophils 50 Not Estab. %   Lymphs 33 Not Estab. %   Monocytes 11 Not Estab. %   Eos 4 Not Estab. %   Basos 1 Not Estab. %   Neutrophils Absolute 2.6 1.4 - 7.0 x10E3/uL   Lymphocytes Absolute 1.7 0.7 - 3.1 x10E3/uL   Monocytes Absolute 0.6 0.1 - 0.9 x10E3/uL   EOS (ABSOLUTE) 0.2 0.0 - 0.4 x10E3/uL   Basophils Absolute 0.0 0.0 - 0.2 x10E3/uL   Immature Granulocytes 1 Not Estab. %   Immature Grans (Abs) 0.0 0.0 - 0.1 x10E3/uL  Comprehensive metabolic panel  Result Value Ref Range   Glucose 127 (H) 65 - 99 mg/dL   BUN 18 8 - 27 mg/dL   Creatinine, Ser 1.16 (H) 0.57 - 1.00 mg/dL   GFR calc non Af Amer 46 (L) >59 mL/min/1.73   GFR calc Af Amer 53 (L) >59 mL/min/1.73   BUN/Creatinine Ratio 16 12 - 28   Sodium 145 (H) 134 - 144 mmol/L   Potassium CANCELED mmol/L   Chloride 104 96 - 106 mmol/L   CO2 22 20 - 29 mmol/L   Calcium 9.5 8.7 - 10.3 mg/dL   Total Protein 7.1 6.0 - 8.5 g/dL   Albumin 4.5 3.7 - 4.7 g/dL   Globulin, Total 2.6 1.5 - 4.5 g/dL   Albumin/Globulin Ratio 1.7 1.2 - 2.2   Bilirubin Total 0.5 0.0 - 1.2 mg/dL   Alkaline  Phosphatase 70 44 - 121 IU/L   AST 27 0 - 40 IU/L   ALT 14 0 - 32 IU/L  Lipid Panel w/o Chol/HDL Ratio  Result Value Ref Range   Cholesterol, Total 165 100 - 199 mg/dL   Triglycerides 56 0 - 149 mg/dL   HDL 79 >39 mg/dL   VLDL Cholesterol Cal 11 5 - 40 mg/dL   LDL Chol Calc (NIH) 75 0 - 99 mg/dL      Assessment & Plan:   Problem List Items Addressed This Visit   None   Visit Diagnoses    Neuralgia    -  Primary   S/p shingles  Discussed symptoms come and go   Right lower quadrant pain       comes and goes.  Asymptomatic at this time.  Pt with frequent gas.  Will RTC if persistant pain        Follow up plan: Return if symptoms worsen or fail to improve.

## 2020-05-13 ENCOUNTER — Other Ambulatory Visit: Payer: Self-pay | Admitting: Nurse Practitioner

## 2020-06-08 ENCOUNTER — Telehealth: Payer: Self-pay | Admitting: Nurse Practitioner

## 2020-06-08 DIAGNOSIS — Z741 Need for assistance with personal care: Secondary | ICD-10-CM

## 2020-06-08 NOTE — Telephone Encounter (Signed)
Routing to provider to advise. Possible referral to the CCM team?

## 2020-06-08 NOTE — Telephone Encounter (Signed)
I can write a letter with this information for the patient but it may be helpful if the patient finds out from the insurance company exactly what they are looking for.

## 2020-06-08 NOTE — Telephone Encounter (Signed)
Pt states she contacted her insurance company about getting some assistance with cleaning.  Pt states they advised her to call the dr and get the dr to send something to them so they can approve. Pt states even 2 X per month would help.  Se needs floors mapped, house dusted, linens changed, garbage emptied. Pt has [employer] for her insurance.

## 2020-06-10 NOTE — Telephone Encounter (Signed)
Can you place a CCM referral and say patient needs personal care services

## 2020-06-10 NOTE — Telephone Encounter (Signed)
Order placed

## 2020-06-13 ENCOUNTER — Telehealth: Payer: Self-pay

## 2020-06-13 NOTE — Chronic Care Management (AMB) (Signed)
  Chronic Care Management   Note  06/13/2020 Name: Michelle Jenkins MRN: [id number] DOB: March 31, 1946  Michelle Jenkins is a 75 y.o. year old female who is a primary care patient of Jon Billings, NP. I reached out to Samuel Bouche by phone today in response to a referral sent by [name]'s patient's AWV (annual wellness visit) nurse, Glenna Durand .      Michelle Jenkins was given information about Chronic Care Management services today including:  1. CCM service includes personalized support from designated clinical staff supervised by her physician, including individualized plan of care and coordination with other care providers 2. 24/7 contact phone numbers for assistance for urgent and routine care needs. 3. Service will only be billed when office clinical staff spend 20 minutes or more in a month to coordinate care. 4. Only one practitioner may furnish and bill the service in a calendar month. 5. The patient may stop CCM services at any time (effective at the end of the month) by phone call to the office staff. 6. The patient will be responsible for cost sharing (co-pay) of up to 20% of the service fee (after annual deductible is met).  Patient agreed to services and verbal consent obtained.   Follow up plan: Telephone appointment with care management team member scheduled for:07/01/2020  Noreene Larsson, [place], [place], [place] [address] Direct Dial: [phone] [email or web address]: [email or web address]

## 2020-07-01 ENCOUNTER — Ambulatory Visit (INDEPENDENT_AMBULATORY_CARE_PROVIDER_SITE_OTHER): Payer: Medicare Other | Admitting: Licensed Clinical Social Worker

## 2020-07-01 DIAGNOSIS — I509 Heart failure, unspecified: Secondary | ICD-10-CM | POA: Diagnosis not present

## 2020-07-01 DIAGNOSIS — Z741 Need for assistance with personal care: Secondary | ICD-10-CM

## 2020-07-01 DIAGNOSIS — I270 Primary pulmonary hypertension: Secondary | ICD-10-CM

## 2020-07-01 DIAGNOSIS — I2510 Atherosclerotic heart disease of native coronary artery without angina pectoris: Secondary | ICD-10-CM | POA: Diagnosis not present

## 2020-07-01 NOTE — Patient Instructions (Signed)
Licensed Clinical Social Worker Visit Information  Goals we discussed today:  Goals Addressed            This Visit's Progress   . Matintain My Quality of Life       Timeframe:  Long-Range Goal Priority:  Medium Start Date:   07/01/20                          Expected End Date:      10/01/20                 Follow Up Date  - check out options for in-home help, long-term care or hospice - complete a living will - discuss my treatment options with the doctor or nurse - do one enjoyable thing every day - do something different, like talking to a new person or going to a new place, every day - learn something new by asking, reading and searching the Internet every day - make an audio or video recording for my loved ones - make shared treatment decisions with doctor - meditate daily - name a health care proxy (decision maker) - share memories using a picture album or scrapbook with my loved ones - spend time with a child every day, borrow one if I have to - spend time outdoors at least 3 times a week - strengthen or fix relationships with loved ones    Why is this important?    Having a long-term illness can be scary.   It can also be stressful for you and your caregiver.   These steps may help.   Current barriers:    Patient in need PCS services   Unable to consistently perform activities of daily living and needs additional assistance / support in order to meet this unmet need  Unable to  independently self manage needs related to chronic health conditions.   Knowledge deficits related to short term plan for care coordination needs and long term plans for chronic disease management needs  Clinical Goals: patient will work with SW to address concerns related to limited in home support patient will work with [place] to address needs related to affording a caregiver in the home. Interventions : . Assessed needs, level of care concerns, basic eligibility and  provided education on available personal care service resources within her area (through [facility]) Patient is interested in getting on wait list for C.H.O.R.E and will hire a part-time caregiver in the meantime until she comes off their wait list. Patient is also interested in the caregiver support stipend program and will contact [place] today to get enrolled in their stipend program which provides $500 per quarter for caregiver relief. Patient does not have Medicaid and is eligible for both programs.  . Reviewed community support options ( CAP, private pay, PACE program) . Collaborated with Home Care Providers and successfully placed patient on wait list for C.H.O.R.E on 07/01/20 . Patient interviewed and appropriate assessments performed . Discussed plans with patient for ongoing care management follow up and provided patient with direct contact information for care management team . Assisted patient/caregiver with obtaining information about health plan benefits . Provided education and assistance to client regarding Advanced Directives. . Provided education to patient/caregiver regarding level of care options. . Provided education to patient/caregiver about Hospice and/or Palliative Care services . Other interventions provided: Motivational Interviewing . Solution-Focused Strategies . Collaboration with PCP regarding development and update of comprehensive  plan of care as evidenced by provider attestation and co-signature . Inter-disciplinary care team collaboration (see longitudinal plan of care) . Patient was informed that current CCM LCSW will be leaving position next month and her next CCM Social Work follow up visit will be with another LCSW. Patient was appreciative of support provided and receptive to news.                 Michelle Jenkins was given information about Chronic Care Management services today including:  1. CCM service includes personalized support  from designated clinical staff supervised by her physician, including individualized plan of care and coordination with other care providers 2. 24/7 contact phone numbers for assistance for urgent and routine care needs. 3. Service will only be billed when office clinical staff spend 20 minutes or more in a month to coordinate care. 4. Only one practitioner may furnish and bill the service in a calendar month. 5. The patient may stop CCM services at any time (effective at the end of the month) by phone call to the office staff. 6. The patient will be responsible for cost sharing (co-pay) of up to 20% of the service fee (after annual deductible is met).  Patient agreed to services and verbal consent obtained.   Michelle Jenkins, BSW, MSW, [email or web address] Phone: [phone]

## 2020-07-01 NOTE — Chronic Care Management (AMB) (Signed)
Chronic Care Management    Clinical Social Work Note  07/01/2020 Name: Michelle Jenkins MRN: [id number] DOB: 11/18/1945  Michelle Jenkins is a 75 y.o. year old female who is a primary care patient of Jon Billings, NP. The CCM team was consulted to assist the patient with chronic disease management and/or care coordination needs related to: Level of Care Concerns.   Engaged with patient by telephone for initial visit in response to provider referral for social work chronic care management and care coordination services.   Consent to Services:  The patient was given the following information about Chronic Care Management services today, agreed to services, and gave verbal consent: 1. CCM service includes personalized support from designated clinical staff supervised by the primary care provider, including individualized plan of care and coordination with other care providers 2. 24/7 contact phone numbers for assistance for urgent and routine care needs. 3. Service will only be billed when office clinical staff spend 20 minutes or more in a month to coordinate care. 4. Only one practitioner may furnish and bill the service in a calendar month. 5.The patient may stop CCM services at any time (effective at the end of the month) by phone call to the office staff. 6. The patient will be responsible for cost sharing (co-pay) of up to 20% of the service fee (after annual deductible is met). Patient agreed to services and consent obtained.  Patient agreed to services and consent obtained.   Assessment: Review of patient past medical history, allergies, medications, and health status, including review of relevant consultants reports was performed today as part of a comprehensive evaluation and provision of chronic care management and care coordination services.     SDOH (Social Determinants of Health) assessments and interventions performed:    Advanced Directives Status: See Care Plan for related  entries.  CCM Care Plan  Allergies  Allergen Reactions  . Ace Inhibitors Cough  . Nyquil Multi-Symptom [Pseudoeph-Doxylamine-Dm-Apap] Itching  . Tylenol [Acetaminophen] Itching    Outpatient Encounter Medications as of 07/01/2020  Medication Sig  . ambrisentan (LETAIRIS) 10 MG tablet Take 10 mg by mouth daily.  Marland Kitchen atorvastatin (LIPITOR) 20 MG tablet TAKE 1 TABLET(20 MG) BY MOUTH DAILY  . ELIQUIS 5 MG TABS tablet TAKE 1 TABLET(5 MG) BY MOUTH TWICE DAILY  . ezetimibe (ZETIA) 10 MG tablet TAKE 1 TABLET(10 MG) BY MOUTH DAILY  . furosemide (LASIX) 20 MG tablet TAKE 1 TABLET(20 MG) BY MOUTH TWICE DAILY AS NEEDED (Patient taking differently: in the morning, at noon, and at bedtime.)  . gabapentin (NEURONTIN) 300 MG capsule TAKE 2 CAPSULES(600 MG) BY MOUTH AT BEDTIME AS NEEDED  . labetalol (NORMODYNE) 200 MG tablet TAKE 1 TABLET(200 MG) BY MOUTH TWICE DAILY  . olmesartan-hydrochlorothiazide (BENICAR HCT) 20-12.5 MG tablet TAKE 1 TABLET BY MOUTH DAILY  . Omega-3 Fatty Acids (FISH OIL PO) Take 1 Dose by mouth daily.  . pantoprazole (PROTONIX) 40 MG tablet TAKE 1 TABLET(40 MG) BY MOUTH DAILY  . potassium chloride (KLOR-CON) 10 MEQ tablet TAKE 1 TABLET(10 MEQ) BY MOUTH EVERY DAY  . Psyllium (METAMUCIL FIBER PO) Take 1 Dose by mouth. Patient uses Metamucil Powder in the AM  . VENTOLIN HFA 108 (90 Base) MCG/ACT inhaler    Facility-Administered Encounter Medications as of 07/01/2020  Medication  . technetium tetrofosmin (TC-MYOVIEW) injection 30 millicurie    Patient Active Problem List   Diagnosis Date Noted  . IFG (impaired fasting glucose) 07/10/2019  . Peripheral neuropathy 05/01/2018  . GERD (  gastroesophageal reflux disease) 12/12/2017  . Atrial fibrillation ([place]) 08/08/2016  . Hypercholesteremia   . Hypertension   . Pulmonary artery hypertension ([place])   . Primary pulmonary hypertension ([place]) 11/30/2014  . CAD in native artery 11/30/2014  . Obstructive apnea 07/30/2014  . CHF  (congestive heart failure) ([place]) 07/30/2014  . PMB (postmenopausal bleeding) 04/20/2014    Conditions to be addressed/monitored: CHF; Level of care concerns and Limited access to caregiver  Care Plan : General Social Work (Adult)  Updates made by Greg Cutter, LCSW since 07/01/2020 12:00 AM    Problem: Quality of Life (General Plan of Care)     Long-Range Goal: Quality of Life Maintained   Start Date: 07/01/2020  Priority: Medium  Note:   Timeframe:  Long-Range Goal Priority:  Medium Start Date:   07/01/20                          Expected End Date:      10/01/20                 Follow Up Date  - check out options for in-home help, long-term care or hospice - complete a living will - discuss my treatment options with the doctor or nurse - do one enjoyable thing every day - do something different, like talking to a new person or going to a new place, every day - learn something new by asking, reading and searching the Internet every day - make an audio or video recording for my loved ones - make shared treatment decisions with doctor - meditate daily - name a health care proxy (decision maker) - share memories using a picture album or scrapbook with my loved ones - spend time with a child every day, borrow one if I have to - spend time outdoors at least 3 times a week - strengthen or fix relationships with loved ones    Why is this important?    Having a long-term illness can be scary.   It can also be stressful for you and your caregiver.   These steps may help.   Current barriers:    Patient in need PCS services   Unable to consistently perform activities of daily living and needs additional assistance / support in order to meet this unmet need  Unable to  independently self manage needs related to chronic health conditions.   Knowledge deficits related to short term plan for care coordination needs and long term plans for chronic disease management  needs  Clinical Goals: patient will work with SW to address concerns related to limited in home support patient will work with [place] to address needs related to affording a caregiver in the home. Interventions : . Assessed needs, level of care concerns, basic eligibility and provided education on available personal care service resources within her area (through [facility]) Patient is interested in getting on wait list for C.H.O.R.E and will hire a part-time caregiver in the meantime until she comes off their wait list. Patient is also interested in the caregiver support stipend program and will contact [place] today to get enrolled in their stipend program which provides $500 per quarter for caregiver relief. Patient does not have Medicaid and is eligible for both programs.  . Reviewed community support options ( CAP, private pay, PACE program) . Collaborated with Home Care Providers and successfully placed patient on wait list for C.H.O.R.E on 07/01/20 . Patient  interviewed and appropriate assessments performed . Discussed plans with patient for ongoing care management follow up and provided patient with direct contact information for care management team . Assisted patient/caregiver with obtaining information about health plan benefits . Provided education and assistance to client regarding Advanced Directives. . Provided education to patient/caregiver regarding level of care options. . Provided education to patient/caregiver about Hospice and/or Palliative Care services . Other interventions provided: Motivational Interviewing . Solution-Focused Strategies . Collaboration with PCP regarding development and update of comprehensive plan of care as evidenced by provider attestation and co-signature . Inter-disciplinary care team collaboration (see longitudinal plan of care) . Patient was informed that current CCM LCSW will be leaving position next month and her  next CCM Social Work follow up visit will be with another LCSW. Patient was appreciative of support provided and receptive to news.    Task: Support and Maintain Acceptable Degree of Health, Comfort and Happiness   Note:   Care Management Activities:    - affirmation provided - community involvement promoted - expression of thoughts about present/future encouraged - independence in all possible areas promoted - life review by storytelling encouraged - palliative care plan developed - patient strengths promoted - psychosocial concerns monitored - self-expression encouraged - sleep diary encouraged - sleep hygiene techniques encouraged - social relationships promoted - strategies to maintain hearing and/or vision promoted - strategies to maintain intimacy promoted - wellness behaviors promoted    Notes:       Follow Up Plan: SW will follow up with patient by phone over the next quarter      Eula Fried, BSW, MSW, [email or web address] Phone: [phone]

## 2020-07-04 ENCOUNTER — Other Ambulatory Visit: Payer: Self-pay | Admitting: Nurse Practitioner

## 2020-07-07 ENCOUNTER — Other Ambulatory Visit: Payer: Self-pay | Admitting: Unknown Physician Specialty

## 2020-07-07 ENCOUNTER — Other Ambulatory Visit: Payer: Self-pay | Admitting: Nurse Practitioner

## 2020-07-07 NOTE — Telephone Encounter (Signed)
Requested Prescriptions  Pending Prescriptions Disp Refills  . olmesartan-hydrochlorothiazide (BENICAR HCT) 20-12.5 MG tablet [Pharmacy Med Name: OLMESARTAN MEDOX/HCTZ 20-12.5MG  TAB] 90 tablet 1    Sig: TAKE 1 TABLET BY MOUTH DAILY     Cardiovascular: ARB + Diuretic Combos Failed - 07/07/2020  7:01 AM      Failed - Na in normal range and within 180 days    Sodium  Date Value Ref Range Status  01/28/2020 145 (H) 134 - 144 mmol/L Final         Failed - Cr in normal range and within 180 days    Creatinine, Ser  Date Value Ref Range Status  01/28/2020 1.16 (H) 0.57 - 1.00 mg/dL Final         Failed - Last BP in normal range    BP Readings from Last 1 Encounters:  05/12/20 (!) 148/63         Passed - K in normal range and within 180 days    Potassium  Date Value Ref Range Status  01/28/2020 CANCELED mmol/L     Comment:    Test not performed. Specimen is hemolyzed. Unable to obtain valid results.  Result canceled by the ancillary.          Passed - Ca in normal range and within 180 days    Calcium  Date Value Ref Range Status  01/28/2020 9.5 8.7 - 10.3 mg/dL Final         Passed - Patient is not pregnant      Passed - Valid encounter within last 6 months    Recent Outpatient Visits          1 month ago Neuralgia   [place], NP   5 months ago Primary hypertension   [facility] Kathrine Haddock, NP   7 months ago Abscess of left axilla   [facility] Kathrine Haddock, NP   7 months ago Encounter for screening mammogram for malignant neoplasm of breast   [place], [place], [place]   11 months ago Chest pain, unspecified type   [facility], Lilia Argue, [place]      Future Appointments            In 7 months [facility], [place]            . pantoprazole (PROTONIX) 40 MG tablet [Pharmacy Med Name: PANTOPRAZOLE 40MG  TABLETS] 90 tablet 1    Sig: TAKE 1  TABLET(40 MG) BY MOUTH DAILY     Gastroenterology: Proton Pump Inhibitors Passed - 07/07/2020  7:01 AM      Passed - Valid encounter within last 12 months    Recent Outpatient Visits          1 month ago Neuralgia   [place], NP   5 months ago Primary hypertension   [facility] Kathrine Haddock, NP   7 months ago Abscess of left axilla   [facility] Kathrine Haddock, NP   7 months ago Encounter for screening mammogram for malignant neoplasm of breast   [place], [place], [place]   11 months ago Chest pain, unspecified type   [facility], [place] Arg[place], [place]      Future Appointments            In 7 months [facility], [place]

## 2020-08-01 ENCOUNTER — Other Ambulatory Visit: Payer: Self-pay | Admitting: Family Medicine

## 2020-08-01 NOTE — Telephone Encounter (Signed)
Requested medication (s) are due for refill today:no  Requested medication (s) are on the active medication list: yes   Last refill:  10/11/2019  Future visit scheduled: yes   Notes to clinic: this script has expired  Review for continued use and refill   Requested Prescriptions  Pending Prescriptions Disp Refills   ELIQUIS 5 MG TABS tablet [Pharmacy Med Name: ELIQUIS 5MG  TABLETS] 180 tablet 0    Sig: TAKE 1 TABLET BY MOUTH TWICE DAILY      Hematology:  Anticoagulants Failed - 08/01/2020 12:49 PM      Failed - Cr in normal range and within 360 days    Creatinine, Ser  Date Value Ref Range Status  01/28/2020 1.16 (H) 0.57 - 1.00 mg/dL Final          Passed - HGB in normal range and within 360 days    Hemoglobin  Date Value Ref Range Status  01/28/2020 11.8 11.1 - 15.9 g/dL Final          Passed - PLT in normal range and within 360 days    Platelets  Date Value Ref Range Status  01/28/2020 171 150 - 450 x10E3/uL Final          Passed - HCT in normal range and within 360 days    Hematocrit  Date Value Ref Range Status  01/28/2020 36.5 34.0 - 46.6 % Final          Passed - Valid encounter within last 12 months    Recent Outpatient Visits           2 months ago [place], [name], NP   6 months ago Primary hypertension   [facility] Kathrine Haddock, NP   7 months ago Abscess of left axilla   [facility] Kathrine Haddock, NP   8 months ago Encounter for screening mammogram for malignant neoplasm of breast   [facility] [place], [place]   1 year ago Chest pain, unspecified type   [facility], [place], [place]       Future Appointments             In 6 months [facility], [place]

## 2020-08-01 NOTE — Telephone Encounter (Signed)
Pt had apt on 05/12/2020 no follow up on note as to when to return.

## 2020-08-18 ENCOUNTER — Telehealth: Payer: Self-pay

## 2020-09-16 ENCOUNTER — Telehealth: Payer: Self-pay

## 2020-09-26 ENCOUNTER — Other Ambulatory Visit: Payer: Self-pay | Admitting: Nurse Practitioner

## 2020-10-07 ENCOUNTER — Ambulatory Visit (INDEPENDENT_AMBULATORY_CARE_PROVIDER_SITE_OTHER): Payer: Medicare Other | Admitting: Licensed Clinical Social Worker

## 2020-10-07 DIAGNOSIS — I10 Essential (primary) hypertension: Secondary | ICD-10-CM | POA: Diagnosis not present

## 2020-10-07 DIAGNOSIS — I509 Heart failure, unspecified: Secondary | ICD-10-CM

## 2020-10-07 DIAGNOSIS — G609 Hereditary and idiopathic neuropathy, unspecified: Secondary | ICD-10-CM

## 2020-10-07 NOTE — Patient Instructions (Signed)
Visit Information   Goals Addressed             This Visit's Progress    Matintain My Quality of Life   On track    Timeframe:  Long-Range Goal Priority:  Medium Start Date:   07/01/20                          Expected End Date: 10/28/20                 Patient Goals/Self-Care Activities Attend all scheduled appointments with providers Contact office with any questions or concerns Continue utilizing stress management strategies discussed         Patient verbalizes understanding of instructions provided today.   Telephone follow up appointment with care management team member scheduled for:10/28/20  Christa See, MSW, [email or web address] Phone [phone] 10:16 AM

## 2020-10-07 NOTE — Chronic Care Management (AMB) (Signed)
Chronic Care Management    Clinical Social Work Note  10/07/2020 Name: Michelle Jenkins MRN: [id number] DOB: 1946/01/21  Michelle Jenkins is a 75 y.o. year old female who is a primary care patient of Jon Billings, NP. The CCM team was consulted to assist the patient with chronic disease management and/or care coordination needs related to: Transportation Needs  and [place] and Resources.   Engaged with patient by telephone for follow up visit in response to provider referral for social work chronic care management and care coordination services.   Consent to Services:  The patient was given information about Chronic Care Management services, agreed to services, and gave verbal consent prior to initiation of services.  Please see initial visit note for detailed documentation.   Patient agreed to services and consent obtained.   Assessment: Patient is engaged in conversation, continues to maintain positive progress with care plan goals. Patient reports ongoing foot pain, stating "It feels like I'm walking on blisters" Patient is requesting a referral to Podiatry. Strategies to assist with stress management discussed. Transportation resources provided. See Care Plan below for interventions and patient self-care actives. Recent life changes /stressors: Pain Recommendation: Patient may benefit from, and is in agreement to work with LCSW to address care coordination needs and will continue to work with the clinical team to address health care and disease management related needs.  Follow up Plan: Patient would like continued follow-up.  CCM LCSW will follow up with patient on 10/28/20. Patient will call office if needed prior to next encounter.   SDOH (Social Determinants of Health) assessments and interventions performed: Transportation  Advanced Directives Status: Not addressed in this encounter.  CCM Care Plan  Allergies  Allergen Reactions   Ace Inhibitors Cough    Nyquil Multi-Symptom [Pseudoeph-Doxylamine-Dm-Apap] Itching   Tylenol [Acetaminophen] Itching    Outpatient Encounter Medications as of 10/07/2020  Medication Sig   ambrisentan (LETAIRIS) 10 MG tablet Take 10 mg by mouth daily.   atorvastatin (LIPITOR) 20 MG tablet TAKE 1 TABLET(20 MG) BY MOUTH DAILY   ELIQUIS 5 MG TABS tablet TAKE 1 TABLET BY MOUTH TWICE DAILY   ezetimibe (ZETIA) 10 MG tablet TAKE 1 TABLET(10 MG) BY MOUTH DAILY   furosemide (LASIX) 20 MG tablet TAKE 1 TABLET(20 MG) BY MOUTH TWICE DAILY AS NEEDED (Patient taking differently: in the morning, at noon, and at bedtime.)   gabapentin (NEURONTIN) 300 MG capsule TAKE 2 CAPSULES(600 MG) BY MOUTH AT BEDTIME AS NEEDED   labetalol (NORMODYNE) 200 MG tablet TAKE 1 TABLET(200 MG) BY MOUTH TWICE DAILY   olmesartan-hydrochlorothiazide (BENICAR HCT) 20-12.5 MG tablet TAKE 1 TABLET BY MOUTH DAILY   Omega-3 Fatty Acids (FISH OIL PO) Take 1 Dose by mouth daily.   pantoprazole (PROTONIX) 40 MG tablet TAKE 1 TABLET(40 MG) BY MOUTH DAILY   potassium chloride (KLOR-CON) 10 MEQ tablet TAKE 1 TABLET(10 MEQ) BY MOUTH EVERY DAY   Psyllium (METAMUCIL FIBER PO) Take 1 Dose by mouth. Patient uses Metamucil Powder in the AM   VENTOLIN HFA 108 (90 Base) MCG/ACT inhaler    Facility-Administered Encounter Medications as of 10/07/2020  Medication   technetium tetrofosmin (TC-MYOVIEW) injection 30 millicurie    Patient Active Problem List   Diagnosis Date Noted   IFG (impaired fasting glucose) 07/10/2019   Peripheral neuropathy 05/01/2018   GERD (gastroesophageal reflux disease) 12/12/2017   Atrial fibrillation ([place]) 08/08/2016   Hypercholesteremia    Hypertension    Pulmonary artery hypertension ([place])  Primary pulmonary hypertension ([place]) 11/30/2014   CAD in native artery 11/30/2014   Obstructive apnea 07/30/2014   CHF (congestive heart failure) ([place]) 07/30/2014   PMB (postmenopausal bleeding) 04/20/2014    Conditions to be  addressed/monitored:  Transportation  Care Plan : General Social Work (Adult)  Updates made by Rebekah Chesterfield, LCSW since 10/07/2020 12:00 AM     Problem: Quality of Life (General Plan of Care)      Long-Range Goal: Quality of Life Maintained   Start Date: 07/01/2020  This Visit's Progress: On track  Priority: Medium  Note:   Timeframe:  Long-Range Goal Priority:  Medium Start Date:   07/01/20                          Expected End Date:      10/28/20                 Current barriers:   Unable to consistently perform activities of daily living and needs additional assistance / support in order to meet this unmet need Unable to  independently self manage needs related to chronic health conditions.  Knowledge deficits related to short term plan for care coordination needs and long term plans for chronic disease management needs  Clinical Goals: patient will work with SW to address concerns related to limited in home support patient will work with [place] to address needs related to affording a caregiver in the home. Interventions : Assessed needs, level of care concerns, basic eligibility and provided education on available resources Patient interviewed and appropriate assessments performed Patient reports that she is experiencing pain when she walks, "I feel like I'm walking on blisters" She shared that she had a previous appointment to have feet assessed; however, provider was unable to complete assessment due to claims that her ankles were swollen (Patient did not feel like her ankles were swollen at the time and reported frustration that a new appointment is needed) Patient is requesting a referral to a Podiatrist Patient is paying out of pocket for an aid to come into the home to assist with ADL's. She is on the wait-list for [facility] with [facility] stating it can be over a year before she receives assistance Patient utilizes [employer] for transportation to  appointments. Services are free; however, patient reports that their schedule fills up fast CCM LCSW discussed additional transportation resources (ie [employer]) to assist with getting to and from appointments  Patient reports that she prefers to "focus on one thing at a time" to decrease feelings of overwhelm and stress. CCM LCSW provided validation and support. Stress management strategies discussed Discussed plans with patient for ongoing care management follow up and provided patient with direct contact information for care management team Other interventions provided: Solution-Focused Strategies Collaboration with PCP regarding development and update of comprehensive plan of care as evidenced by provider attestation and co-signature Inter-disciplinary care team collaboration (see longitudinal plan of care) Patient Goals/Self-Care Activities Attend all scheduled appointments with providers Contact office with any questions or concerns Continue utilizing stress management strategies discussed      Christa See, MSW, [email or web address] Phone [phone] 10:14 AM

## 2020-10-17 ENCOUNTER — Other Ambulatory Visit: Payer: Self-pay | Admitting: Nurse Practitioner

## 2020-10-26 ENCOUNTER — Other Ambulatory Visit: Payer: Self-pay | Admitting: Nurse Practitioner

## 2020-10-28 ENCOUNTER — Ambulatory Visit (INDEPENDENT_AMBULATORY_CARE_PROVIDER_SITE_OTHER): Payer: Medicare Other | Admitting: Licensed Clinical Social Worker

## 2020-10-28 DIAGNOSIS — I509 Heart failure, unspecified: Secondary | ICD-10-CM

## 2020-10-28 DIAGNOSIS — G609 Hereditary and idiopathic neuropathy, unspecified: Secondary | ICD-10-CM

## 2020-10-28 DIAGNOSIS — I10 Essential (primary) hypertension: Secondary | ICD-10-CM | POA: Diagnosis not present

## 2020-10-28 NOTE — Patient Instructions (Signed)
Visit Information   Goals Addressed             This Visit's Progress    [place] My Quality of Life   On track    Timeframe:  Long-Range Goal Priority:  Medium Start Date:   07/01/20                          Expected End Date: 01/28/21         Follow up Date: 11/25/20         Patient Goals/Self-Care Activities Attend all scheduled appointments with providers Contact office with any questions or concerns Continue utilizing stress management strategies discussed        Patient verbalizes understanding of instructions provided today.   Telephone follow up appointment with care management team member scheduled for:11/25/20  Christa See, MSW, [email or web address] Phone [phone] 12:25 PM

## 2020-10-28 NOTE — Chronic Care Management (AMB) (Signed)
Chronic Care Management    Clinical Social Work Note  10/28/2020 Name: Michelle Jenkins MRN: [id number] DOB: 02/04/1946  Michelle Jenkins is a 75 y.o. year old female who is a primary care patient of Jon Billings, NP. The CCM team was consulted to assist the patient with chronic disease management and/or care coordination needs related to: Mental Health Counseling and Resources.   Engaged with patient by telephone for follow up visit in response to provider referral for social work chronic care management and care coordination services.   Consent to Services:  The patient was given information about Chronic Care Management services, agreed to services, and gave verbal consent prior to initiation of services.  Please see initial visit note for detailed documentation.   Patient agreed to services and consent obtained.   Assessment:  Patient continues to experience difficulty with pain in both feet which negatively impacts mood. Patient endorsed feelings of frustration with past treatment of providers, in addition, to limited support with referrals. CCM LCSW discussed healthy coping skills. Message to [facility] to assist with scheduling a follow up appt with PCP completed. Patient is interested in a referral to podiatry. See Care Plan below for interventions and patient self-care actives. Recent life changes /stressors: Management of pain Recommendation: Patient may benefit from, and is in agreement to work with LCSW to address care coordination needs and will continue to work with the clinical team to address health care and disease management related needs.  Follow up Plan: Patient would like continued follow-up from CCM LCSW .  Follow up scheduled in 11/25/20. Patient will call office if needed prior to next encounter.   SDOH (Social Determinants of Health) assessments and interventions performed:    Advanced Directives Status: Not addressed in this encounter.  CCM Care Plan  Allergies   Allergen Reactions   Ace Inhibitors Cough   Nyquil Multi-Symptom [Pseudoeph-Doxylamine-Dm-Apap] Itching   Tylenol [Acetaminophen] Itching    Outpatient Encounter Medications as of 10/28/2020  Medication Sig   ambrisentan (LETAIRIS) 10 MG tablet Take 10 mg by mouth daily.   atorvastatin (LIPITOR) 20 MG tablet TAKE 1 TABLET(20 MG) BY MOUTH DAILY   ELIQUIS 5 MG TABS tablet TAKE 1 TABLET BY MOUTH TWICE DAILY   ELIQUIS 5 MG TABS tablet TAKE 1 TABLET BY MOUTH TWICE DAILY   ezetimibe (ZETIA) 10 MG tablet TAKE 1 TABLET(10 MG) BY MOUTH DAILY   furosemide (LASIX) 20 MG tablet TAKE 1 TABLET(20 MG) BY MOUTH TWICE DAILY AS NEEDED (Patient taking differently: in the morning, at noon, and at bedtime.)   gabapentin (NEURONTIN) 300 MG capsule TAKE 2 CAPSULES(600 MG) BY MOUTH AT BEDTIME AS NEEDED   labetalol (NORMODYNE) 200 MG tablet TAKE 1 TABLET(200 MG) BY MOUTH TWICE DAILY   olmesartan-hydrochlorothiazide (BENICAR HCT) 20-12.5 MG tablet TAKE 1 TABLET BY MOUTH DAILY   Omega-3 Fatty Acids (FISH OIL PO) Take 1 Dose by mouth daily.   pantoprazole (PROTONIX) 40 MG tablet TAKE 1 TABLET(40 MG) BY MOUTH DAILY   potassium chloride (KLOR-CON) 10 MEQ tablet TAKE 1 TABLET(10 MEQ) BY MOUTH EVERY DAY   Psyllium (METAMUCIL FIBER PO) Take 1 Dose by mouth. Patient uses Metamucil Powder in the AM   VENTOLIN HFA 108 (90 Base) MCG/ACT inhaler    Facility-Administered Encounter Medications as of 10/28/2020  Medication   technetium tetrofosmin (TC-MYOVIEW) injection 30 millicurie    Patient Active Problem List   Diagnosis Date Noted   IFG (impaired fasting glucose) 07/10/2019   Peripheral neuropathy 05/01/2018  GERD (gastroesophageal reflux disease) 12/12/2017   Atrial fibrillation ([place]) 08/08/2016   Hypercholesteremia    Hypertension    Pulmonary artery hypertension ([facility])    Primary pulmonary hypertension ([place]) 11/30/2014   CAD in native artery 11/30/2014   Obstructive apnea 07/30/2014   CHF (congestive heart  failure) ([place]) 07/30/2014   PMB (postmenopausal bleeding) 04/20/2014    Conditions to be addressed/monitored:  Pain in feet ; Mental Health Concerns   Care Plan : General Social Work (Adult)  Updates made by Rebekah Chesterfield, LCSW since 10/28/2020 12:00 AM     Problem: Quality of Life (General Plan of Care)      Long-Range Goal: Quality of Life Maintained   Start Date: 07/01/2020  This Visit's Progress: On track  Recent Progress: On track  Priority: Medium  Note:   Timeframe:  Long-Range Goal Priority:  Medium Start Date:   07/01/20                          Expected End Date:      02/13/21     Follow up-11/25/20              Current barriers:   Unable to consistently perform activities of daily living and needs additional assistance / support in order to meet this unmet need Unable to  independently self manage needs related to chronic health conditions.  Knowledge deficits related to short term plan for care coordination needs and long term plans for chronic disease management needs  Clinical Goals: patient will work with SW to address concerns related to limited in home support patient will work with [place] to address needs related to affording a caregiver in the home. Interventions : Assessed needs, level of care concerns, basic eligibility and provided education on available resources Patient interviewed and appropriate assessments performed Patient endorses continued pain and discomfort in feet, stating "it's like that all of the time" States that no one has contacted patient about podiatry referral  Patient reported feelings of frustration in the medical care she was provided in the past, stating that providers often do not take her opinion in account during treatment planning Patient's throat has been hurting for the last couple of weeks. She has an appointment with a specialist on 11/24/20 for a check up CCM LCSW informed patient that she has not been  evaluated by PCP since January 2022. Patient agreed to have CCM LCSW assist in scheduling a follow up appointment to address pain in feet CCM LCSW collaborated with CFP front desk staff to contact patient to schedule an appt with PCP CCM LCSW provided validation and encouragement. Healthy coping skills identified Patient reports that she is experiencing pain when she walks, "I feel like I'm walking on blisters" She shared that she had a previous appointment to have feet assessed; however, provider was unable to complete assessment due to claims that her ankles were swollen (Patient did not feel like her ankles were swollen at the time and reported frustration that a new appointment is needed) Patient is requesting a referral to a Podiatrist Patient is paying out of pocket for an aid to come into the home to assist with ADL's. She is on the wait-list for [facility] with [facility] stating it can be over a year before she receives assistance Patient utilizes [employer] for transportation to appointments. Services are free; however, patient reports that their schedule fills up fast. CCM LCSW discussed additional transportation  resources (ie [employer]) to assist with getting to and from appointments  Patient reports that she prefers to "focus on one thing at a time" to decrease feelings of overwhelm and stress. CCM LCSW provided validation and support. Stress management strategies discussed Discussed plans with patient for ongoing care management follow up and provided patient with direct contact information for care management team Other interventions provided: Solution-Focused Strategies Collaboration with PCP regarding development and update of comprehensive plan of care as evidenced by provider attestation and co-signature Inter-disciplinary care team collaboration (see longitudinal plan of care) Patient Goals/Self-Care Activities Attend all scheduled appointments with  providers Contact office with any questions or concerns Continue utilizing stress management strategies discussed     Christa See, MSW, [email or web address] Phone [phone] 12:22 PM

## 2020-10-31 ENCOUNTER — Telehealth: Payer: Self-pay

## 2020-10-31 NOTE — Progress Notes (Signed)
Unable to lvm to make this apt/  

## 2020-10-31 NOTE — Telephone Encounter (Signed)
Lvm to make this apt. 

## 2020-10-31 NOTE — Telephone Encounter (Signed)
Pt is scheduled 7/21

## 2020-10-31 NOTE — Telephone Encounter (Signed)
-----   Message from Rebekah Chesterfield, LCSW sent at 10/28/2020 12:28 PM EDT ----- Hi, patient needs to schedule a follow up appointment with PCP to address ongoing discomfort/pain in feet. She is interested in a referral to podiatry

## 2020-11-03 ENCOUNTER — Ambulatory Visit (INDEPENDENT_AMBULATORY_CARE_PROVIDER_SITE_OTHER): Payer: Medicare Other | Admitting: Nurse Practitioner

## 2020-11-03 ENCOUNTER — Encounter: Payer: Self-pay | Admitting: Nurse Practitioner

## 2020-11-03 ENCOUNTER — Other Ambulatory Visit: Payer: Self-pay

## 2020-11-03 VITALS — BP 156/92 | HR 65 | Wt 320.5 lb

## 2020-11-03 DIAGNOSIS — G629 Polyneuropathy, unspecified: Secondary | ICD-10-CM

## 2020-11-03 NOTE — Progress Notes (Signed)
BP (!) 156/92   Pulse 65   Wt (!) 320 lb 8 oz (145.4 kg)   SpO2 99%   BMI 53.33 kg/m    Subjective:    Patient ID: Michelle Jenkins, female    DOB: November 20, 1945, 75 y.o.   MRN: [id number]  HPI: Michelle Jenkins is a 75 y.o. female  Chief Complaint  Patient presents with   Referral    Podiatry, feels like she is walking on blisters   Patient states she is having difficulty walking on her feet.  It feels like she is walking on blisters.  Denies any actually blisters.  She says its not an actual pain.  She states it feels like it just isn't right.   Relevant past medical, surgical, family and social history reviewed and updated as indicated. Interim medical history since our last visit reviewed. Allergies and medications reviewed and updated.  Review of Systems  Musculoskeletal:        Bilateral foot pain   Per HPI unless specifically indicated above     Objective:    BP (!) 156/92   Pulse 65   Wt (!) 320 lb 8 oz (145.4 kg)   SpO2 99%   BMI 53.33 kg/m   Wt Readings from Last 3 Encounters:  11/03/20 (!) 320 lb 8 oz (145.4 kg)  05/12/20 (!) 320 lb (145.2 kg)  02/19/20 (!) 320 lb (145.2 kg)    Physical Exam Vitals and nursing note reviewed.  Constitutional:      General: She is not in acute distress.    Appearance: Normal appearance. She is obese. She is not ill-appearing, toxic-appearing or diaphoretic.  HENT:     Head: Normocephalic.     Right Ear: External ear normal.     Left Ear: External ear normal.     Nose: Nose normal.     Mouth/Throat:     Mouth: Mucous membranes are moist.     Pharynx: Oropharynx is clear.  Eyes:     General:        Right eye: No discharge.        Left eye: No discharge.     Extraocular Movements: Extraocular movements intact.     Conjunctiva/sclera: Conjunctivae normal.     Pupils: Pupils are equal, round, and reactive to light.  Cardiovascular:     Rate and Rhythm: Normal rate and regular rhythm.     Heart sounds: No murmur  heard. Pulmonary:     Effort: Pulmonary effort is normal. No respiratory distress.     Breath sounds: Normal breath sounds. No wheezing or rales.  Musculoskeletal:        General: No swelling, tenderness, deformity or signs of injury.     Cervical back: Normal range of motion and neck supple.     Right lower leg: Edema present.     Left lower leg: Edema present.  Skin:    General: Skin is warm and dry.     Capillary Refill: Capillary refill takes less than 2 seconds.  Neurological:     General: No focal deficit present.     Mental Status: She is alert and oriented to person, place, and time. Mental status is at baseline.  Psychiatric:        Mood and Affect: Mood normal.        Behavior: Behavior normal.        Thought Content: Thought content normal.        Judgment: Judgment normal.  Results for orders placed or performed in visit on 01/28/20  CBC with Differential/Platelet  Result Value Ref Range   WBC 5.1 3.4 - 10.8 x10E3/uL   RBC 3.75 (L) 3.77 - 5.28 x10E6/uL   Hemoglobin 11.8 11.1 - 15.9 g/dL   Hematocrit 36.5 34.0 - 46.6 %   MCV 97 79 - 97 fL   MCH 31.5 26.6 - 33.0 pg   MCHC 32.3 31.5 - 35.7 g/dL   RDW 13.8 11.7 - 15.4 %   Platelets 171 150 - 450 x10E3/uL   Neutrophils 50 Not Estab. %   Lymphs 33 Not Estab. %   Monocytes 11 Not Estab. %   Eos 4 Not Estab. %   Basos 1 Not Estab. %   Neutrophils Absolute 2.6 1.4 - 7.0 x10E3/uL   Lymphocytes Absolute 1.7 0.7 - 3.1 x10E3/uL   Monocytes Absolute 0.6 0.1 - 0.9 x10E3/uL   EOS (ABSOLUTE) 0.2 0.0 - 0.4 x10E3/uL   Basophils Absolute 0.0 0.0 - 0.2 x10E3/uL   Immature Granulocytes 1 Not Estab. %   Immature Grans (Abs) 0.0 0.0 - 0.1 x10E3/uL  Comprehensive metabolic panel  Result Value Ref Range   Glucose 127 (H) 65 - 99 mg/dL   BUN 18 8 - 27 mg/dL   Creatinine, Ser 1.16 (H) 0.57 - 1.00 mg/dL   GFR calc non Af Amer 46 (L) >59 mL/min/1.73   GFR calc Af Amer 53 (L) >59 mL/min/1.73   BUN/Creatinine Ratio 16 12 - 28    Sodium 145 (H) 134 - 144 mmol/L   Potassium CANCELED mmol/L   Chloride 104 96 - 106 mmol/L   CO2 22 20 - 29 mmol/L   Calcium 9.5 8.7 - 10.3 mg/dL   Total Protein 7.1 6.0 - 8.5 g/dL   Albumin 4.5 3.7 - 4.7 g/dL   Globulin, Total 2.6 1.5 - 4.5 g/dL   Albumin/Globulin Ratio 1.7 1.2 - 2.2   Bilirubin Total 0.5 0.0 - 1.2 mg/dL   Alkaline Phosphatase 70 44 - 121 IU/L   AST 27 0 - 40 IU/L   ALT 14 0 - 32 IU/L  Lipid Panel w/o Chol/HDL Ratio  Result Value Ref Range   Cholesterol, Total 165 100 - 199 mg/dL   Triglycerides 56 0 - 149 mg/dL   HDL 79 >39 mg/dL   VLDL Cholesterol Cal 11 5 - 40 mg/dL   LDL Chol Calc (NIH) 75 0 - 99 mg/dL      Assessment & Plan:   Problem List Items Addressed This Visit   None Visit Diagnoses     Neuropathy    -  Primary   Patient requested to see Podiatry. Referral placed for patient to have feet evaluated by Podiatrist.   Relevant Orders   Ambulatory referral to Podiatry        Follow up plan: Return if symptoms worsen or fail to improve.

## 2020-11-16 ENCOUNTER — Other Ambulatory Visit: Payer: Self-pay

## 2020-11-16 ENCOUNTER — Encounter: Payer: Self-pay | Admitting: Podiatry

## 2020-11-16 ENCOUNTER — Ambulatory Visit (INDEPENDENT_AMBULATORY_CARE_PROVIDER_SITE_OTHER): Payer: Medicare Other | Admitting: Podiatry

## 2020-11-16 DIAGNOSIS — G609 Hereditary and idiopathic neuropathy, unspecified: Secondary | ICD-10-CM | POA: Diagnosis not present

## 2020-11-16 NOTE — Patient Instructions (Signed)
Talk to your PCP about increasing your gabapentin dosage or switching to Lyrica if it worsens and becomes more bothersome

## 2020-11-18 ENCOUNTER — Ambulatory Visit: Payer: Self-pay | Admitting: *Deleted

## 2020-11-18 NOTE — Telephone Encounter (Signed)
Reason for Disposition  Back pain    See notes.   Pt not in pain now.  Answer Assessment - Initial Assessment Questions 1. ONSET: "When did the pain begin?"     I returned her call.   She had called in c/o hurting in her right upper back near her shoulder.   "I was hurting until I made that phone call".    "I feel fine now".  I let her know I was glad she was feeling better.   Then she asked,  "So what made me hurt in my back there?"    "Was my lung wanting to collapse or something?"   "Which side is my lung on?"   I let her know we have a lung in each side of our chest.   "Oh so it probably wasn't my lung".  "I'm just glad I feel better now".   "I'm not hurting now".     I let her know I was glad she felt better and if there was anything else I could help her with.   She replied,  "No".  "Thank you" and hung up. 2. LOCATION: "Where does it hurt?" (upper, mid or lower back)     *No Answer* 3. SEVERITY: "How bad is the pain?"  (e.g., Scale 1-10; mild, moderate, or severe)   - MILD (1-3): doesn't interfere with normal activities    - MODERATE (4-7): interferes with normal activities or awakens from sleep    - SEVERE (8-10): excruciating pain, unable to do any normal activities      *No Answer* 4. PATTERN: "Is the pain constant?" (e.g., yes, no; constant, intermittent)      *No Answer* 5. RADIATION: "Does the pain shoot into your legs or elsewhere?"     *No Answer* 6. CAUSE:  "What do you think is causing the back pain?"      *No Answer* 7. BACK OVERUSE:  "Any recent lifting of heavy objects, strenuous work or exercise?"     *No Answer* 8. MEDICATIONS: "What have you taken so far for the pain?" (e.g., nothing, acetaminophen, NSAIDS)     *No Answer* 9. NEUROLOGIC SYMPTOMS: "Do you have any weakness, numbness, or problems with bowel/bladder control?"     *No Answer* 10. OTHER SYMPTOMS: "Do you have any other symptoms?" (e.g., fever, abdominal pain, burning with urination, blood in urine)        *No Answer* 11. PREGNANCY: "Is there any chance you are pregnant?" (e.g., yes, no; LMP)       *No Answer*  Protocols used: Back Pain-A-AH

## 2020-11-20 NOTE — Progress Notes (Signed)
  Subjective:  Patient ID: Michelle Jenkins, female    DOB: [birth year],  MRN: [id number]  Chief Complaint  Patient presents with   Peripheral Neuropathy       (np) bil  Neuropathy    75 y.o. female presents with the above complaint. History confirmed with patient.  She does like she is walking on fluid her blisters feel squishy in the bottoms of her feet.  She is not diabetic.  She has been diagnosed with neuropathy and takes Neurontin for this.  Objective:  Physical Exam: warm, good capillary refill, no trophic changes or ulcerative lesions, normal DP and PT pulses, and abnormal sensory exam she has loss of protective sensations that is inconsistent in the toes. Assessment:   1. Idiopathic peripheral neuropathy      Plan:  Patient was evaluated and treated and all questions answered.  Discussed with her that I do think this is something consistent with idiopathic peripheral neuropathy.  She is on Neurontin and she will discuss further management of this with her PCP including dosage of Neurontin as well as Lyrica use.  May benefit from referral to neurology.  Discussed with her that I do not see anything wrong with her foot and ankle in and of itself that would warrant intervention.  Return to see me as needed  Return if symptoms worsen or fail to improve.

## 2020-11-21 ENCOUNTER — Other Ambulatory Visit: Payer: Self-pay | Admitting: Nurse Practitioner

## 2020-11-21 NOTE — Telephone Encounter (Signed)
Patient last seen 11/03/20

## 2020-11-21 NOTE — Telephone Encounter (Signed)
Requested medication (s) are due for refill today: no  Requested medication (s) are on the active medication list: yes   Last refill:  07/07/2020  Future visit scheduled:yes  Notes to clinic: review for refill  Patient due for follow up on blood pressure    Requested Prescriptions  Pending Prescriptions Disp Refills   labetalol (NORMODYNE) 200 MG tablet [Pharmacy Med Name: LABETALOL '200MG'$  TABLETS] 180 tablet 0    Sig: TAKE 1 TABLET(200 MG) BY MOUTH TWICE DAILY      Cardiovascular:  Beta Blockers Failed - 11/21/2020  8:07 AM      Failed - Last BP in normal range    BP Readings from Last 1 Encounters:  11/03/20 (!) 156/92          Passed - Last Heart Rate in normal range    Pulse Readings from Last 1 Encounters:  11/03/20 65          Passed - Valid encounter within last 6 months    Recent Outpatient Visits           2 weeks ago Neuropathy   [facility] Jon Billings, NP   6 months ago [place] Kathrine Haddock, NP   9 months ago Primary hypertension   [facility] Kathrine Haddock, NP   11 months ago Abscess of left axilla   [facility] Kathrine Haddock, NP   12 months ago Encounter for screening mammogram for malignant neoplasm of breast   [place], Lilia Argue, [place]       Future Appointments             In 3 months [facility], [place]

## 2020-11-25 ENCOUNTER — Telehealth: Payer: Self-pay | Admitting: Licensed Clinical Social Worker

## 2020-11-25 ENCOUNTER — Telehealth: Payer: Self-pay

## 2020-11-25 NOTE — Telephone Encounter (Signed)
    Clinical Social Work  Chronic Care Management   Phone Outreach    11/25/2020 Name: Michelle Jenkins MRN: [id number] DOB: 03/30/1946  Michelle Jenkins is a 75 y.o. year old female who is a primary care patient of Jon Billings, NP .   Reason for referral: Level of Care Concerns and Mental Health Counseling and Resources.    F/U phone call today to assess needs, progress and barriers with care plan goals.   Telephone outreach was unsuccessful Unable to leave a HIPPA compliant phone message due to voice mail not set up.  Plan:CCM LCSW will wait for return call. If no return call is received, Will reach out to patient again in the next 30 days .   Review of patient status, including review of consultants reports, relevant laboratory and other test results, and collaboration with appropriate care team members and the patient's provider was performed as part of comprehensive patient evaluation and provision of care management services.    Christa See, MSW, [email or web address] Phone [phone] 4:37 PM

## 2020-12-07 ENCOUNTER — Other Ambulatory Visit: Payer: Self-pay | Admitting: Nurse Practitioner

## 2020-12-07 DIAGNOSIS — Z1231 Encounter for screening mammogram for malignant neoplasm of breast: Secondary | ICD-10-CM

## 2020-12-14 ENCOUNTER — Ambulatory Visit (INDEPENDENT_AMBULATORY_CARE_PROVIDER_SITE_OTHER): Payer: Medicare Other | Admitting: Nurse Practitioner

## 2020-12-14 ENCOUNTER — Encounter: Payer: Self-pay | Admitting: Nurse Practitioner

## 2020-12-14 ENCOUNTER — Other Ambulatory Visit: Payer: Self-pay

## 2020-12-14 VITALS — BP 136/83 | HR 80 | Temp 99.6°F | Wt 320.0 lb

## 2020-12-14 DIAGNOSIS — R110 Nausea: Secondary | ICD-10-CM | POA: Diagnosis not present

## 2020-12-14 MED ORDER — FUROSEMIDE 20 MG PO TABS: 20.0000 mg | ORAL_TABLET | Freq: Two times a day (BID) | ORAL | 1 refills | Status: DC

## 2020-12-14 MED ORDER — ONDANSETRON 4 MG PO TBDP: 4.0000 mg | ORAL_TABLET | Freq: Three times a day (TID) | ORAL | 0 refills | Status: DC | PRN

## 2020-12-14 NOTE — Progress Notes (Signed)
BP 136/83   Pulse 80   Temp 99.6 F (37.6 C)   Wt (!) 320 lb (145.2 kg)   SpO2 97%   BMI 53.25 kg/m    Subjective:    Patient ID: Michelle Jenkins, female    DOB: [birth year], 75 y.o.   MRN: [id number]  HPI: Michelle Jenkins is a 75 y.o. female  Chief Complaint  Patient presents with   Obesity   Nausea   NAUSEA Patient states she has been feeling like she is going to throw up on and off for about a week.  Patient is not sure why she keeps having the nausea.  She did recently see the ENT who removed something from her ear but she isn't sure what.   Relevant past medical, surgical, family and social history reviewed and updated as indicated. Interim medical history since our last visit reviewed. Allergies and medications reviewed and updated.  Review of Systems  Gastrointestinal:  Positive for nausea.   Per HPI unless specifically indicated above     Objective:    BP 136/83   Pulse 80   Temp 99.6 F (37.6 C)   Wt (!) 320 lb (145.2 kg)   SpO2 97%   BMI 53.25 kg/m   Wt Readings from Last 3 Encounters:  12/14/20 (!) 320 lb (145.2 kg)  11/03/20 (!) 320 lb 8 oz (145.4 kg)  05/12/20 (!) 320 lb (145.2 kg)    Physical Exam Vitals and nursing note reviewed.  Constitutional:      General: She is not in acute distress.    Appearance: Normal appearance. She is obese. She is not ill-appearing, toxic-appearing or diaphoretic.  HENT:     Head: Normocephalic.     Right Ear: External ear normal.     Left Ear: External ear normal.     Nose: Nose normal.     Mouth/Throat:     Mouth: Mucous membranes are moist.     Pharynx: Oropharynx is clear.  Eyes:     General:        Right eye: No discharge.        Left eye: No discharge.     Extraocular Movements: Extraocular movements intact.     Conjunctiva/sclera: Conjunctivae normal.     Pupils: Pupils are equal, round, and reactive to light.  Cardiovascular:     Rate and Rhythm: Normal rate and regular rhythm.     Heart  sounds: No murmur heard. Pulmonary:     Effort: Pulmonary effort is normal. No respiratory distress.     Breath sounds: Normal breath sounds. No wheezing or rales.  Musculoskeletal:     Cervical back: Normal range of motion and neck supple.  Skin:    General: Skin is warm and dry.     Capillary Refill: Capillary refill takes less than 2 seconds.  Neurological:     General: No focal deficit present.     Mental Status: She is alert and oriented to person, place, and time. Mental status is at baseline.  Psychiatric:        Mood and Affect: Mood normal.        Behavior: Behavior normal.        Thought Content: Thought content normal.        Judgment: Judgment normal.    Results for orders placed or performed in visit on 01/28/20  CBC with Differential/Platelet  Result Value Ref Range   WBC 5.1 3.4 - 10.8 x10E3/uL   RBC 3.75 (  L) 3.77 - 5.28 x10E6/uL   Hemoglobin 11.8 11.1 - 15.9 g/dL   Hematocrit 36.5 34.0 - 46.6 %   MCV 97 79 - 97 fL   MCH 31.5 26.6 - 33.0 pg   MCHC 32.3 31.5 - 35.7 g/dL   RDW 13.8 11.7 - 15.4 %   Platelets 171 150 - 450 x10E3/uL   Neutrophils 50 Not Estab. %   Lymphs 33 Not Estab. %   Monocytes 11 Not Estab. %   Eos 4 Not Estab. %   Basos 1 Not Estab. %   Neutrophils Absolute 2.6 1.4 - 7.0 x10E3/uL   Lymphocytes Absolute 1.7 0.7 - 3.1 x10E3/uL   Monocytes Absolute 0.6 0.1 - 0.9 x10E3/uL   EOS (ABSOLUTE) 0.2 0.0 - 0.4 x10E3/uL   Basophils Absolute 0.0 0.0 - 0.2 x10E3/uL   Immature Granulocytes 1 Not Estab. %   Immature Grans (Abs) 0.0 0.0 - 0.1 x10E3/uL  Comprehensive metabolic panel  Result Value Ref Range   Glucose 127 (H) 65 - 99 mg/dL   BUN 18 8 - 27 mg/dL   Creatinine, Ser 1.16 (H) 0.57 - 1.00 mg/dL   GFR calc non Af Amer 46 (L) >59 mL/min/1.73   GFR calc Af Amer 53 (L) >59 mL/min/1.73   BUN/Creatinine Ratio 16 12 - 28   Sodium 145 (H) 134 - 144 mmol/L   Potassium CANCELED mmol/L   Chloride 104 96 - 106 mmol/L   CO2 22 20 - 29 mmol/L   Calcium  9.5 8.7 - 10.3 mg/dL   Total Protein 7.1 6.0 - 8.5 g/dL   Albumin 4.5 3.7 - 4.7 g/dL   Globulin, Total 2.6 1.5 - 4.5 g/dL   Albumin/Globulin Ratio 1.7 1.2 - 2.2   Bilirubin Total 0.5 0.0 - 1.2 mg/dL   Alkaline Phosphatase 70 44 - 121 IU/L   AST 27 0 - 40 IU/L   ALT 14 0 - 32 IU/L  Lipid Panel w/o Chol/HDL Ratio  Result Value Ref Range   Cholesterol, Total 165 100 - 199 mg/dL   Triglycerides 56 0 - 149 mg/dL   HDL 79 >39 mg/dL   VLDL Cholesterol Cal 11 5 - 40 mg/dL   LDL Chol Calc (NIH) 75 0 - 99 mg/dL      Assessment & Plan:   Problem List Items Addressed This Visit   None Visit Diagnoses     Nausea    -  Primary   Recommend using Zofran PRN for nausea.  Discussed proper use of the medication. If symptoms do not improve, recommend patient follow up in office.        Follow up plan: Return if symptoms worsen or fail to improve.

## 2020-12-27 ENCOUNTER — Other Ambulatory Visit: Payer: Self-pay | Admitting: Student

## 2020-12-27 DIAGNOSIS — R600 Localized edema: Secondary | ICD-10-CM

## 2020-12-27 DIAGNOSIS — I480 Paroxysmal atrial fibrillation: Secondary | ICD-10-CM

## 2020-12-27 DIAGNOSIS — R0789 Other chest pain: Secondary | ICD-10-CM

## 2020-12-27 DIAGNOSIS — R0609 Other forms of dyspnea: Secondary | ICD-10-CM

## 2020-12-28 ENCOUNTER — Ambulatory Visit
Admission: RE | Admit: 2020-12-28 | Discharge: 2020-12-28 | Disposition: A | Discharge status: Home / Self Care | Payer: Medicare Other | Source: Ambulatory Visit | Attending: Nurse Practitioner | Admitting: Nurse Practitioner

## 2020-12-28 ENCOUNTER — Other Ambulatory Visit: Payer: Self-pay

## 2020-12-28 DIAGNOSIS — Z1231 Encounter for screening mammogram for malignant neoplasm of breast: Secondary | ICD-10-CM | POA: Insufficient documentation

## 2021-01-01 ENCOUNTER — Other Ambulatory Visit: Payer: Self-pay | Admitting: Nurse Practitioner

## 2021-01-03 NOTE — Progress Notes (Signed)
Please let patient know her Mammogram did not show any evidence of a malignancy.  The recommendation is to repeat the Mammogram in 1 year.  

## 2021-01-05 ENCOUNTER — Ambulatory Visit: Admission: RE | Admit: 2021-01-05 | Payer: Medicare Other | Source: Ambulatory Visit

## 2021-01-07 ENCOUNTER — Other Ambulatory Visit: Payer: Self-pay | Admitting: Nurse Practitioner

## 2021-01-07 ENCOUNTER — Other Ambulatory Visit: Payer: Self-pay | Admitting: Family Medicine

## 2021-01-09 ENCOUNTER — Encounter
Admission: RE | Admit: 2021-01-09 | Discharge: 2021-01-09 | Disposition: A | Discharge status: Home / Self Care | Payer: Medicare Other | Source: Ambulatory Visit | Attending: Student | Admitting: Student

## 2021-01-09 ENCOUNTER — Other Ambulatory Visit: Payer: Self-pay

## 2021-01-09 DIAGNOSIS — I480 Paroxysmal atrial fibrillation: Secondary | ICD-10-CM | POA: Diagnosis present

## 2021-01-09 DIAGNOSIS — R0609 Other forms of dyspnea: Secondary | ICD-10-CM | POA: Insufficient documentation

## 2021-01-09 DIAGNOSIS — R0789 Other chest pain: Secondary | ICD-10-CM | POA: Diagnosis present

## 2021-01-09 MED ORDER — TECHNETIUM TC 99M TETROFOSMIN IV KIT
30.0000 | PACK | Freq: Once | INTRAVENOUS | Status: AC | PRN
  Administered 2021-01-09: 30.54 via INTRAVENOUS

## 2021-01-10 ENCOUNTER — Encounter
Admission: RE | Admit: 2021-01-10 | Discharge: 2021-01-10 | Disposition: A | Discharge status: Home / Self Care | Payer: Medicare Other | Source: Ambulatory Visit | Attending: Student | Admitting: Student

## 2021-01-10 LAB — NM MYOCAR MULTI W/SPECT W/WALL MOTION / EF
Base ST Depression (mm): 0 mm
Estimated workload: 1
Exercise duration (min): 1 min
Exercise duration (sec): 18 s
LV dias vol: 145 mL (ref 46–106)
LV sys vol: 53 mL
MPHR: 144 {beats}/min
Nuc Stress EF: 63 %
Peak HR: 70 {beats}/min
Percent HR: 48 %
Rest HR: 58 {beats}/min
Rest Nuclear Isotope Dose: 30.8 mCi
SDS: 4
SRS: 15
SSS: 14
ST Depression (mm): 0 mm
Stress Nuclear Isotope Dose: 30.5 mCi
TID: 0.88

## 2021-01-10 MED ORDER — TECHNETIUM TC 99M TETROFOSMIN IV KIT
30.0000 | PACK | Freq: Once | INTRAVENOUS | Status: AC | PRN
  Administered 2021-01-10: 30.8 via INTRAVENOUS

## 2021-01-10 MED ORDER — REGADENOSON 0.4 MG/5ML IV SOLN
0.4000 mg | Freq: Once | INTRAVENOUS | Status: AC
  Administered 2021-01-09: 0.4 mg via INTRAVENOUS

## 2021-01-17 ENCOUNTER — Other Ambulatory Visit: Payer: Self-pay | Admitting: Nurse Practitioner

## 2021-01-20 ENCOUNTER — Telehealth: Payer: Self-pay

## 2021-01-20 NOTE — Telephone Encounter (Signed)
Copied from [place] [id number]. Topic: General - Other >> Jan 20, 2021 12:38 PM Pawlus, Brayton Layman A wrote: Reason for CRM: Pt had some questions regarding gabapentin (NEURONTIN) 300 MG capsule, pt stated she already tried to call the pharmacy but had no response, pt was concerned because her medication looks different and the capsules are different colors, pt wanted to make sure this is still Gabapentin.

## 2021-01-20 NOTE — Telephone Encounter (Signed)
Called patient about her concerns about her new prescription that she just picked up yesterday. I informed her that the change in color is likely just a different manufacturer, however to be sure she should call the pharmacy again and to just check with them.

## 2021-02-20 ENCOUNTER — Ambulatory Visit: Payer: Medicare Other

## 2021-02-22 ENCOUNTER — Ambulatory Visit (INDEPENDENT_AMBULATORY_CARE_PROVIDER_SITE_OTHER): Payer: Medicare Other | Admitting: Nurse Practitioner

## 2021-02-22 DIAGNOSIS — Z0000 Encounter for general adult medical examination without abnormal findings: Secondary | ICD-10-CM

## 2021-02-22 NOTE — Progress Notes (Signed)
Subjective:   Michelle Jenkins is a 75 y.o. female who presents for Medicare Annual (Subsequent) preventive examination.  I connected with  Michelle Jenkins on 02/22/21 by a  telephone enabled telemedicine application and verified that I am speaking with the correct person using two identifiers.   I discussed the limitations of evaluation and management by telemedicine. The patient expressed understanding and agreed to proceed.    Review of Systems     Cardiac Risk Factors include: advanced age (>32men, >49 women);hypertension;obesity (BMI >30kg/m2);sedentary lifestyle     Objective:    Today's Vitals   There is no height or weight on file to calculate BMI.  Advanced Directives 02/22/2021 02/19/2020 09/24/2019 02/12/2019 01/16/2018 12/13/2016 02/20/2016  Does Patient Have a Medical Advance Directive? No No No No No No No  Would patient like information on creating a medical advance directive? No - Patient declined - No - Patient declined - No - Patient declined Yes (MAU/Ambulatory/Procedural Areas - Information given) No - patient declined information    Current Medications (verified) Outpatient Encounter Medications as of 02/22/2021  Medication Sig   ambrisentan (LETAIRIS) 10 MG tablet Take 10 mg by mouth daily.   atorvastatin (LIPITOR) 20 MG tablet TAKE 1 TABLET(20 MG) BY MOUTH DAILY   ELIQUIS 5 MG TABS tablet TAKE 1 TABLET BY MOUTH TWICE DAILY   ELIQUIS 5 MG TABS tablet TAKE 1 TABLET BY MOUTH TWICE DAILY   ezetimibe (ZETIA) 10 MG tablet TAKE 1 TABLET(10 MG) BY MOUTH DAILY   furosemide (LASIX) 20 MG tablet Take 1 tablet (20 mg total) by mouth 2 (two) times daily. TAKE 1 TABLET(20 MG) BY MOUTH TWICE DAILY AS NEEDED   gabapentin (NEURONTIN) 300 MG capsule TAKE 2 CAPSULES(600 MG) BY MOUTH AT BEDTIME AS NEEDED   labetalol (NORMODYNE) 200 MG tablet TAKE 1 TABLET(200 MG) BY MOUTH TWICE DAILY   olmesartan-hydrochlorothiazide (BENICAR HCT) 20-12.5 MG tablet TAKE 1 TABLET BY MOUTH DAILY    Omega-3 Fatty Acids (FISH OIL PO) Take 1 Dose by mouth daily.   ondansetron (ZOFRAN ODT) 4 MG disintegrating tablet Take 1 tablet (4 mg total) by mouth every 8 (eight) hours as needed for nausea or vomiting.   pantoprazole (PROTONIX) 40 MG tablet TAKE 1 TABLET(40 MG) BY MOUTH DAILY   potassium chloride (KLOR-CON) 10 MEQ tablet TAKE 1 TABLET(10 MEQ) BY MOUTH EVERY DAY   Psyllium (METAMUCIL FIBER PO) Take 1 Dose by mouth. Patient uses Metamucil Powder in the AM   VENTOLIN HFA 108 (90 Base) MCG/ACT inhaler    XYLIMELTS 550 MG DISK DISSOLVE 1 TABLET ON THE TONGUE EVERY 8 HOURS AS NEEDED FOR DRY MOUTH.   No facility-administered encounter medications on file as of 02/22/2021.    Allergies (verified) Ace inhibitors, Nyquil multi-symptom [pseudoeph-doxylamine-dm-apap], and Tylenol [acetaminophen]   History: Past Medical History:  Diagnosis Date   Anginal pain (HCC)    Asthma    Bronchitis    CAD (coronary artery disease)    CHF (congestive heart failure) (HCC)    CVA (cerebral infarction)    Dysrhythmia    GERD (gastroesophageal reflux disease)    History of chronic atrial fibrillation    Hypercholesteremia    Hypertension    Idiopathic pulmonary hypertension (HCC)    Myocardial infarction (HCC)    Obesity    OSA (obstructive sleep apnea)    Pre-diabetes    Pulmonary artery hypertension (HCC)    Shortness of breath dyspnea    Sleep apnea    Stroke (  [facility])    Past Surgical History:  Procedure Laterality Date   BREAST BIOPSY Right 11/15/2015   stereo, COMPLEX SCLEROSING LESION WITH INTRADUCTAL PAPILLOMA COMPONENT AND    BREAST EXCISIONAL BIOPSY Right 02/27/2016   NEG   BREAST LUMPECTOMY WITH NEEDLE LOCALIZATION Right 02/27/2016   Procedure: BREAST LUMPECTOMY WITH NEEDLE LOCALIZATION;  Surgeon: Christene Lye, MD;  Location: [facility] ORS;  Service: General;  Laterality: Right;   CARDIAC CATHETERIZATION     CARDIAC CATHETERIZATION Right 12/07/2014   Procedure: Right and Left Heart  Cath with Coronary Angiography;  Surgeon: Teodoro Spray, MD;  Location: [facility];  Service: Cardiovascular;  Laterality: Right;   CARPAL TUNNEL RELEASE  1998  and 1999   CATARACT EXTRACTION W/PHACO Right 09/24/2019   Procedure: CATARACT EXTRACTION PHACO AND INTRAOCULAR LENS PLACEMENT ([place]) RIGHT;  Surgeon: Birder Robson, MD;  Location: [facility] ORS;  Service: Ophthalmology;  Laterality: Right;  [id number]:44.8 us6.96 [id number]   COLONOSCOPY  10/31/1998   DILATION AND CURETTAGE OF UTERUS     FOOT SURGERY Right    HYSTEROSCOPY     JOINT REPLACEMENT Left    KNEE ARTHROSCOPY AND ARTHROTOMY Right    lt knee replacement     TONSILLECTOMY     UVULOPALATOPHARYNGOPLASTY     Family History  Problem Relation Age of Onset   Diabetes Mother    Alzheimer's disease Mother    Throat cancer Brother    Diabetes Brother    Cancer Brother        lung   Hypertension Brother    Cancer Daughter 71       metastatic uterine PECOMA to the liver and lungs   Rheumatic fever Daughter    Diabetes Sister    Hypertension Sister    COPD Neg Hx    Heart disease Neg Hx    Stroke Neg Hx    Breast cancer Neg Hx    Social History   Socioeconomic History   Marital status: Single    Spouse name: Not on file   Number of children: Not on file   Years of education: Not on file   Highest education level: 8th grade  Occupational History   Occupation: retired  Tobacco Use   Smoking status: Former    Packs/day: 0.25    Types: Cigarettes    Quit date: 06/15/2014    Years since quitting: 6.6   Smokeless tobacco: Never   Tobacco comments:    4-5 cigarettes a day when she smoked  Vaping Use   Vaping Use: Never used  Substance and Sexual Activity   Alcohol use: Yes    Alcohol/week: 0.0 - 1.0 standard drinks    Comment: occasionally   Drug use: No   Sexual activity: Not Currently  Other Topics Concern   Not on file  Social History Narrative   Not on file   Social Determinants of Health    Financial Resource Strain: Low Risk    Difficulty of Paying Living Expenses: Not very hard  Food Insecurity: No Food Insecurity   Worried About Running Out of Food in the Last Year: Never true   Ran Out of Food in the Last Year: Never true  Transportation Needs: No Transportation Needs   Lack of Transportation (Medical): No   Lack of Transportation (Non-Medical): No  Physical Activity: Inactive   Days of Exercise per Week: 0 days   Minutes of Exercise per Session: 0 min  Stress: No Stress Concern Present   Feeling of  Stress : Not at all  Social Connections: Unknown   Frequency of Communication with Friends and Family: Three times a week   Frequency of Social Gatherings with Friends and Family: Three times a week   Attends Religious Services: 1 to 4 times per year   Active Member of Clubs or Organizations: No   Attends Music therapist: Never   Marital Status: Not on file    Tobacco Counseling Counseling given: Not Answered Tobacco comments: 4-5 cigarettes a day when she smoked   Clinical Intake:  Pre-visit preparation completed: Yes  Pain : No/denies pain     Nutritional Risks: None Diabetes: No  How often do you need to have someone help you when you read instructions, pamphlets, or other written materials from your doctor or pharmacy?: 1 - Never  Diabetic?  No   Interpreter Needed?: No  Information entered by :: Leroy Kennedy LPN   Activities of Daily Living In your present state of health, do you have any difficulty performing the following activities: 02/22/2021  Hearing? N  Vision? N  Difficulty concentrating or making decisions? N  Walking or climbing stairs? Y  Dressing or bathing? Y  Doing errands, shopping? Y  Using the Toilet? N  Managing your Medications? N  Housekeeping or managing your Housekeeping? Y  Some recent data might be hidden    Patient Care Team: Jon Billings, NP as PCP - General Albina Billet, MD (Internal  Medicine) Christene Lye, MD (General Surgery) Rebekah Chesterfield, LCSW as Social Worker (Licensed Clinical Social Worker)  Indicate any recent [employer] you may have received from other than [facility] providers in the past year (date may be approximate).     Assessment:   This is a routine wellness examination for [place]ina.  Hearing/Vision screen Hearing Screening - Comments:: No trouble hearing Vision Screening - Comments:: Not up to date Had cataracts removed Vision issue in one eye  [facility]  Dietary issues and exercise activities discussed:     Goals Addressed             This Visit's Progress    Patient Stated   Not on track    02/19/2020, working on losing weight     Weight (lb) < 200 lb (90.7 kg)       Working on loosing weight       Depression Screen PHQ 2/9 Scores 02/22/2021 02/19/2020 02/12/2019 01/16/2018 12/12/2017 12/13/2016 09/05/2016  PHQ - 2 Score 0 0 0 4 0 2 0  PHQ- 9 Score - - - 10 10 2  -    Fall Risk Fall Risk  02/22/2021 02/19/2020 12/10/2019 01/16/2018 09/06/2017  Falls in the past year? 0 0 0 No No  Number falls in past yr: 0 - 0 - -  Injury with Fall? 0 - 0 - -  Risk for fall due to : - Medication side effect;Impaired mobility - - -  Follow up Falls evaluation completed;Falls prevention discussed Falls evaluation completed;Education provided;Falls prevention discussed - - -    FALL RISK PREVENTION PERTAINING TO THE HOME:  Any stairs in or around the home? No  If so, are there any without handrails? No  Home free of loose throw rugs in walkways, pet beds, electrical cords, etc? Yes  Adequate lighting in your home to reduce risk of falls? Yes   ASSISTIVE DEVICES UTILIZED TO PREVENT FALLS:  Life alert? No  Use of a cane, walker or w/c? Yes  Grab bars  in the bathroom? Yes  Shower chair or bench in shower? No  Elevated toilet seat or a handicapped toilet? No   TIMED UP AND GO:  Was the test performed? No .    Cognitive  Function:  Normal cognitive status assessed by direct observation by this Nurse Health Advisor. No abnormalities found.       6CIT Screen 02/19/2020 01/16/2018 12/13/2016  What Year? 0 points 0 points 0 points  What month? 0 points 0 points 0 points  What time? 0 points 0 points 0 points  Count back from 20 0 points 0 points 0 points  Months in reverse 2 points 0 points 0 points  Repeat phrase 8 points 2 points 4 points  Total Score 10 2 4     Immunizations Immunization History  Administered Date(s) Administered   Fluad Quad(high Dose 65+) 01/12/2021   Influenza, High Dose Seasonal PF 12/31/2017, 12/31/2017, 12/11/2018   PFIZER(Purple Top)SARS-COV-2 Vaccination 06/12/2019, 07/08/2019   Pneumococcal Conjugate-13 01/16/2018   Pneumococcal Polysaccharide-23 12/13/2016   Td 05/11/1999   Zoster Recombinat (Shingrix) 11/01/2017, 12/31/2017, 04/11/2018    TDAP status: Due, Education has been provided regarding the importance of this vaccine. Advised may receive this vaccine at local pharmacy or Health Dept. Aware to provide a copy of the vaccination record if obtained from local pharmacy or Health Dept. Verbalized acceptance and understanding.  Flu Vaccine status: Up to date  Pneumococcal vaccine status: Up to date  Covid-19 vaccine status: Information provided on how to obtain vaccines.   Qualifies for Shingles Vaccine? No   Zostavax completed Yes   Shingrix Completed?: Yes  Screening Tests Health Maintenance  Topic Date Due   TETANUS/TDAP  05/10/2009   COVID-19 Vaccine (3 - Booster for Pfizer series) 09/02/2019   MAMMOGRAM  12/28/2021   Pneumonia Vaccine 62+ Years old  Completed   INFLUENZA VACCINE  Completed   DEXA SCAN  Completed   Hepatitis C Screening  Completed   Zoster Vaccines- Shingrix  Completed   HPV VACCINES  Aged Out    Health Maintenance  Health Maintenance Due  Topic Date Due   TETANUS/TDAP  05/10/2009   COVID-19 Vaccine (3 - Booster for Pfizer series)  09/02/2019    Colorectal cancer screening: Type of screening: Cologuard. Completed 2020. Repeat every 3 years  Mammogram status: Completed  . Repeat every year  Bone Density status: Completed 2018. Results reflect: Bone density results: NORMAL. Repeat every 0 years.  Lung Cancer Screening: (Low Dose CT Chest recommended if Age 59-80 years, 30 pack-year currently smoking OR have quit w/in 15years.) does not qualify.   Lung Cancer Screening Referral:   Additional Screening:  Hepatitis C Screening: does not qualify; Completed 2018  Vision Screening: Recommended annual ophthalmology exams for early detection of glaucoma and other disorders of the eye. Is the patient up to date with their annual eye exam?  No  Who is the provider or what is the name of the office in which the patient attends annual eye exams? [facility] If pt is not established with a provider, would they like to be referred to a provider to establish care? No .   Dental Screening: Recommended annual dental exams for proper oral hygiene  Community Resource Referral / Chronic Care Management: CRR required this visit?  No   CCM required this visit?  No      Plan:     I have personally reviewed and noted the following in the patient's chart:   Medical and  social history Use of alcohol, tobacco or illicit drugs  Current medications and supplements including opioid prescriptions.  Functional ability and status Nutritional status Physical activity Advanced directives List of other physicians Hospitalizations, surgeries, and ER visits in previous 12 months Vitals Screenings to include cognitive, depression, and falls Referrals and appointments  In addition, I have reviewed and discussed with patient certain preventive protocols, quality metrics, and best practice recommendations. A written personalized care plan for preventive services as well as general preventive health recommendations were provided to  patient.     Leroy Kennedy, LPN   64/06/1425   Nurse Notes:

## 2021-02-22 NOTE — Patient Instructions (Signed)
Michelle Jenkins , Thank you for taking time to come for your Medicare Wellness Visit. I appreciate your ongoing commitment to your health goals. Please review the following plan we discussed and let me know if I can assist you in the future.   Screening recommendations/referrals: Colonoscopy: up to date Recommended yearly ophthalmology/optometry visit for glaucoma screening and checkup Recommended yearly dental visit for hygiene and checkup  Vaccinations: Influenza vaccine: up to date Pneumococcal vaccine: up to date Tdap vaccine: Education provided Shingles vaccine: up to date    Advanced directives: Education provided  Conditions/risks identified:     Preventive Care 88 Years and Older, Female Preventive care refers to lifestyle choices and visits with your health care provider that can promote health and wellness. What does preventive care include? A yearly physical exam. This is also called an annual well check. Dental exams once or twice a year. Routine eye exams. Ask your health care provider how often you should have your eyes checked. Personal lifestyle choices, including: Daily care of your teeth and gums. Regular physical activity. Eating a healthy diet. Avoiding tobacco and drug use. Limiting alcohol use. Practicing safe sex. Taking low doses of aspirin every day. Taking vitamin and mineral supplements as recommended by your health care provider. What happens during an annual well check? The services and screenings done by your health care provider during your annual well check will depend on your age, overall health, lifestyle risk factors, and family history of disease. Counseling  Your health care provider may ask you questions about your: Alcohol use. Tobacco use. Drug use. Emotional well-being. Home and relationship well-being. Sexual activity. Eating habits. History of falls. Memory and ability to understand (cognition). Work and work Statistician. Screening   You may have the following tests or measurements: Height, weight, and BMI. Blood pressure. Lipid and cholesterol levels. These may be checked every 5 years, or more frequently if you are over 15 years old. Skin check. Lung cancer screening. You may have this screening every year starting at age 58 if you have a 30-pack-year history of smoking and currently smoke or have quit within the past 15 years. Fecal occult blood test (FOBT) of the stool. You may have this test every year starting at age 80. Flexible sigmoidoscopy or colonoscopy. You may have a sigmoidoscopy every 5 years or a colonoscopy every 10 years starting at age 25. Prostate cancer screening. Recommendations will vary depending on your family history and other risks. Hepatitis C blood test. Hepatitis B blood test. Sexually transmitted disease (STD) testing. Diabetes screening. This is done by checking your blood sugar (glucose) after you have not eaten for a while (fasting). You may have this done every 1-3 years. Abdominal aortic aneurysm (AAA) screening. You may need this if you are a current or former smoker. Osteoporosis. You may be screened starting at age 40 if you are at high risk. Talk with your health care provider about your test results, treatment options, and if necessary, the need for more tests. Vaccines  Your health care provider may recommend certain vaccines, such as: Influenza vaccine. This is recommended every year. Tetanus, diphtheria, and acellular pertussis (Tdap, Td) vaccine. You may need a Td booster every 10 years. Zoster vaccine. You may need this after age 94. Pneumococcal 13-valent conjugate (PCV13) vaccine. One dose is recommended after age 2. Pneumococcal polysaccharide (PPSV23) vaccine. One dose is recommended after age 26. Talk to your health care provider about which screenings and vaccines you need and how often  you need them. This information is not intended to replace advice given to you by  your health care provider. Make sure you discuss any questions you have with your health care provider. Document Released: 04/29/2015 Document Revised: 12/21/2015 Document Reviewed: 02/01/2015 Elsevier Interactive Patient Education  2017 [place] Prevention in the Home Falls can cause injuries. They can happen to people of all ages. There are many things you can do to make your home safe and to help prevent falls. What can I do on the outside of my home? Regularly fix the edges of walkways and driveways and fix any cracks. Remove anything that might make you trip as you walk through a door, such as a raised step or threshold. Trim any bushes or trees on the path to your home. Use bright outdoor lighting. Clear any walking paths of anything that might make someone trip, such as rocks or tools. Regularly check to see if handrails are loose or broken. Make sure that both sides of any steps have handrails. Any raised decks and porches should have guardrails on the edges. Have any leaves, snow, or ice cleared regularly. Use sand or salt on walking paths during winter. Clean up any spills in your garage right away. This includes oil or grease spills. What can I do in the bathroom? Use night lights. Install grab bars by the toilet and in the tub and shower. Do not use towel bars as grab bars. Use non-skid mats or decals in the tub or shower. If you need to sit down in the shower, use a plastic, non-slip stool. Keep the floor dry. Clean up any water that spills on the floor as soon as it happens. Remove soap buildup in the tub or shower regularly. Attach bath mats securely with double-sided non-slip rug tape. Do not have throw rugs and other things on the floor that can make you trip. What can I do in the bedroom? Use night lights. Make sure that you have a light by your bed that is easy to reach. Do not use any sheets or blankets that are too big for your bed. They should not hang  down onto the floor. Have a firm chair that has side arms. You can use this for support while you get dressed. Do not have throw rugs and other things on the floor that can make you trip. What can I do in the kitchen? Clean up any spills right away. Avoid walking on wet floors. Keep items that you use a lot in easy-to-reach places. If you need to reach something above you, use a strong step stool that has a grab bar. Keep electrical cords out of the way. Do not use floor polish or wax that makes floors slippery. If you must use wax, use non-skid floor wax. Do not have throw rugs and other things on the floor that can make you trip. What can I do with my stairs? Do not leave any items on the stairs. Make sure that there are handrails on both sides of the stairs and use them. Fix handrails that are broken or loose. Make sure that handrails are as long as the stairways. Check any carpeting to make sure that it is firmly attached to the stairs. Fix any carpet that is loose or worn. Avoid having throw rugs at the top or bottom of the stairs. If you do have throw rugs, attach them to the floor with carpet tape. Make sure that you have a light  switch at the top of the stairs and the bottom of the stairs. If you do not have them, ask someone to add them for you. What else can I do to help prevent falls? Wear shoes that: Do not have high heels. Have rubber bottoms. Are comfortable and fit you well. Are closed at the toe. Do not wear sandals. If you use a stepladder: Make sure that it is fully opened. Do not climb a closed stepladder. Make sure that both sides of the stepladder are locked into place. Ask someone to hold it for you, if possible. Clearly mark and make sure that you can see: Any grab bars or handrails. First and last steps. Where the edge of each step is. Use tools that help you move around (mobility aids) if they are needed. These  include: Canes. Walkers. Scooters. Crutches. Turn on the lights when you go into a dark area. Replace any light bulbs as soon as they burn out. Set up your furniture so you have a clear path. Avoid moving your furniture around. If any of your floors are uneven, fix them. If there are any pets around you, be aware of where they are. Review your medicines with your doctor. Some medicines can make you feel dizzy. This can increase your chance of falling. Ask your doctor what other things that you can do to help prevent falls. This information is not intended to replace advice given to you by your health care provider. Make sure you discuss any questions you have with your health care provider. Document Released: 01/27/2009 Document Revised: 09/08/2015 Document Reviewed: 05/07/2014 Elsevier Interactive Patient Education  2017 [employer].

## 2021-02-27 NOTE — Progress Notes (Signed)
I connected with  Michelle Jenkins on 02/27/21 by a video enabled telemedicine application and verified that I am speaking with the correct person using two identifiers.   I discussed the limitations of evaluation and management by telemedicine. The patient expressed understanding and agreed to proceed.  Patient location: home  Provider location: tele-health ( not in office)

## 2021-03-30 ENCOUNTER — Other Ambulatory Visit: Payer: Self-pay | Admitting: Nurse Practitioner

## 2021-03-30 NOTE — Telephone Encounter (Signed)
Requested medication (s) are due for refill today: yes  Requested medication (s) are on the active medication list: yes  Last refill:  01/07/21  Future visit scheduled: no  Notes to clinic:  Failed protocol of labs within 180 days, labs are greater than 360 days, 01/2020, no upcoming appt, please assess.   Requested Prescriptions  Pending Prescriptions Disp Refills   olmesartan-hydrochlorothiazide (BENICAR HCT) 20-12.5 MG tablet [Pharmacy Med Name: OLMESARTAN MEDOX/HCTZ 20-12.5MG  TAB] 90 tablet 0    Sig: TAKE 1 TABLET BY MOUTH DAILY     Cardiovascular: ARB + Diuretic Combos Failed - 03/30/2021  7:13 AM      Failed - K in normal range and within 180 days    Potassium  Date Value Ref Range Status  01/28/2020 CANCELED mmol/L     Comment:    Test not performed. Specimen is hemolyzed. Unable to obtain valid results.  Result canceled by the ancillary.           Failed - Na in normal range and within 180 days    Sodium  Date Value Ref Range Status  01/28/2020 145 (H) 134 - 144 mmol/L Final          Failed - Cr in normal range and within 180 days    Creatinine, Ser  Date Value Ref Range Status  01/28/2020 1.16 (H) 0.57 - 1.00 mg/dL Final          Failed - Ca in normal range and within 180 days    Calcium  Date Value Ref Range Status  01/28/2020 9.5 8.7 - 10.3 mg/dL Final          Passed - Patient is not pregnant      Passed - Last BP in normal range    BP Readings from Last 1 Encounters:  12/14/20 136/83          Passed - Valid encounter within last 6 months    Recent Outpatient Visits           3 months ago Nausea   [facility] Jon Billings, NP   4 months ago Neuropathy   [facility] Jon Billings, NP   10 months ago Neuralgia   [facility] Kathrine Haddock, NP   1 year ago Primary hypertension   [facility] Kathrine Haddock, NP   1 year ago Abscess of left axilla   [facility]  Kathrine Haddock, NP              Signed Prescriptions Disp Refills   pantoprazole (PROTONIX) 40 MG tablet 90 tablet 1    Sig: TAKE 1 TABLET(40 MG) BY MOUTH DAILY     Gastroenterology: Proton Pump Inhibitors Passed - 03/30/2021  7:13 AM      Passed - Valid encounter within last 12 months    Recent Outpatient Visits           3 months ago Nausea   [facility] Jon Billings, NP   4 months ago Neuropathy   [place], NP   10 months ago Neuralgia   [facility] Kathrine Haddock, NP   1 year ago Primary hypertension   [facility] Kathrine Haddock, NP   1 year ago Abscess of left axilla   [facility] Kathrine Haddock, NP

## 2021-03-30 NOTE — Telephone Encounter (Signed)
Requested medication (s) are due for refill today: yes  Requested medication (s) are on the active medication list: yes (Eliquis on the med list twice)  Last refill:  01/02/21  Future visit scheduled: no  Notes to clinic:  Failed protocol of labs within 360 days, labs from 01/2020 no upcoming appt, please assess.  Requested Prescriptions  Pending Prescriptions Disp Refills   ezetimibe (ZETIA) 10 MG tablet [Pharmacy Med Name: EZETIMIBE 10MG  TABLETS] 90 tablet 0    Sig: TAKE 1 TABLET(10 MG) BY MOUTH DAILY     Cardiovascular:  Antilipid - Sterol Transport Inhibitors Failed - 03/30/2021  7:13 AM      Failed - Total Cholesterol in normal range and within 360 days    Cholesterol, Total  Date Value Ref Range Status  01/28/2020 165 100 - 199 mg/dL Final   Cholesterol Piccolo, Waived  Date Value Ref Range Status  09/05/2016 154 <200 mg/dL Final    Comment:                            Desirable                <200                         Borderline High      200- 239                         High                     >239           Failed - LDL in normal range and within 360 days    LDL Chol Calc (NIH)  Date Value Ref Range Status  01/28/2020 75 0 - 99 mg/dL Final          Failed - HDL in normal range and within 360 days    HDL  Date Value Ref Range Status  01/28/2020 79 >39 mg/dL Final          Failed - Triglycerides in normal range and within 360 days    Triglycerides  Date Value Ref Range Status  01/28/2020 56 0 - 149 mg/dL Final   Triglycerides Piccolo,Waived  Date Value Ref Range Status  09/05/2016 46 <150 mg/dL Final    Comment:                            Normal                   <150                         Borderline High     150 - 199                         High                200 - 499                         Very High                >499           Passed - Valid encounter within  last 12 months    Recent Outpatient Visits           3 months ago Nausea    [facility] Jon Billings, NP   4 months ago Neuropathy   [place], NP   10 months ago [place] Kathrine Haddock, NP   1 year ago Primary hypertension   [facility] Kathrine Haddock, NP   1 year ago Abscess of left axilla   [facility] Kathrine Haddock, NP               ELIQUIS 5 MG TABS tablet [Pharmacy Med Name: ELIQUIS 5MG  TABLETS] 180 tablet 0    Sig: TAKE 1 TABLET BY MOUTH TWICE DAILY     Hematology:  Anticoagulants Failed - 03/30/2021  7:13 AM      Failed - HGB in normal range and within 360 days    Hemoglobin  Date Value Ref Range Status  01/28/2020 11.8 11.1 - 15.9 g/dL Final          Failed - PLT in normal range and within 360 days    Platelets  Date Value Ref Range Status  01/28/2020 171 150 - 450 x10E3/uL Final          Failed - HCT in normal range and within 360 days    Hematocrit  Date Value Ref Range Status  01/28/2020 36.5 34.0 - 46.6 % Final          Failed - Cr in normal range and within 360 days    Creatinine, Ser  Date Value Ref Range Status  01/28/2020 1.16 (H) 0.57 - 1.00 mg/dL Final          Passed - Valid encounter within last 12 months    Recent Outpatient Visits           3 months ago Nausea   [place], NP   4 months ago Neuropathy   [place], NP   10 months ago Neuralgia   [facility] Kathrine Haddock, NP   1 year ago Primary hypertension   [facility] Kathrine Haddock, NP   1 year ago Abscess of left axilla   [facility] Kathrine Haddock, NP

## 2021-03-30 NOTE — Telephone Encounter (Signed)
Requested Prescriptions  Pending Prescriptions Disp Refills   pantoprazole (PROTONIX) 40 MG tablet [Pharmacy Med Name: PANTOPRAZOLE 40MG  TABLETS] 90 tablet 1    Sig: TAKE 1 TABLET(40 MG) BY MOUTH DAILY     Gastroenterology: Proton Pump Inhibitors Passed - 03/30/2021  7:13 AM      Passed - Valid encounter within last 12 months    Recent Outpatient Visits          3 months ago Nausea   [facility] Jon Billings, NP   4 months ago Neuropathy   [place], NP   10 months ago Neuralgia   [facility] Kathrine Haddock, NP   1 year ago Primary hypertension   [facility] Kathrine Haddock, NP   1 year ago Abscess of left axilla   [facility] Kathrine Haddock, NP              olmesartan-hydrochlorothiazide (BENICAR HCT) 20-12.5 MG tablet [Pharmacy Med Name: OLMESARTAN MEDOX/HCTZ 20-12.5MG  TAB] 90 tablet 0    Sig: TAKE 1 TABLET BY MOUTH DAILY     Cardiovascular: ARB + Diuretic Combos Failed - 03/30/2021  7:13 AM      Failed - K in normal range and within 180 days    Potassium  Date Value Ref Range Status  01/28/2020 CANCELED mmol/L     Comment:    Test not performed. Specimen is hemolyzed. Unable to obtain valid results.  Result canceled by the ancillary.          Failed - Na in normal range and within 180 days    Sodium  Date Value Ref Range Status  01/28/2020 145 (H) 134 - 144 mmol/L Final         Failed - Cr in normal range and within 180 days    Creatinine, Ser  Date Value Ref Range Status  01/28/2020 1.16 (H) 0.57 - 1.00 mg/dL Final         Failed - Ca in normal range and within 180 days    Calcium  Date Value Ref Range Status  01/28/2020 9.5 8.7 - 10.3 mg/dL Final         Passed - Patient is not pregnant      Passed - Last BP in normal range    BP Readings from Last 1 Encounters:  12/14/20 136/83         Passed - Valid encounter within last 6 months    Recent Outpatient  Visits          3 months ago Nausea   [facility] Jon Billings, NP   4 months ago Neuropathy   [place], NP   10 months ago Neuralgia   [facility] Kathrine Haddock, NP   1 year ago Primary hypertension   [facility] Kathrine Haddock, NP   1 year ago Abscess of left axilla   [facility] Kathrine Haddock, NP

## 2021-05-04 ENCOUNTER — Ambulatory Visit: Payer: Self-pay | Admitting: *Deleted

## 2021-05-04 NOTE — Telephone Encounter (Signed)
Summary: med question   Patient called in says was told by insurance company that they don't like for patients  to take pantoprazole 40 mg a lont time because it can cause bone damage, and she was asked if she had a bone density test. She has questions about the medicine. Please call back      Reason for Disposition  [1] Caller has NON-URGENT medicine question about med that PCP prescribed AND [2] triager unable to answer question  Answer Assessment - Initial Assessment Questions 1. NAME of MEDICATION: "What medicine are you calling about?"     Pantoprazole 40 2. QUESTION: "What is your question?" (e.g., double dose of medicine, side effect)     Can this medication cause bone loss/fracture 3. PRESCRIBING HCP: "Who prescribed it?" Reason: if prescribed by specialist, call should be referred to that group.     PCP 4. SYMPTOMS: "Do you have any symptoms?"     No symptoms Per medication history- first Rx 2018 Only bone density on record- 2016  Patient wants to know if she needs bone density and alternative medication  Protocols used: Medication Question Call-A-AH

## 2021-05-04 NOTE — Telephone Encounter (Signed)
She had a bone density scan in 2016. It is not recommended long term but if patient is still having GERD/ Reflux symptoms then the medication is continued.

## 2021-05-04 NOTE — Telephone Encounter (Signed)
Patient notified of Karen's message. Follow up also scheduled as well.

## 2021-05-09 ENCOUNTER — Ambulatory Visit (INDEPENDENT_AMBULATORY_CARE_PROVIDER_SITE_OTHER): Payer: Medicare Other | Admitting: Nurse Practitioner

## 2021-05-09 ENCOUNTER — Encounter: Payer: Self-pay | Admitting: Nurse Practitioner

## 2021-05-09 ENCOUNTER — Other Ambulatory Visit: Payer: Self-pay

## 2021-05-09 VITALS — BP 131/83 | HR 64 | Temp 98.2°F | Wt 320.0 lb

## 2021-05-09 DIAGNOSIS — K219 Gastro-esophageal reflux disease without esophagitis: Secondary | ICD-10-CM | POA: Diagnosis not present

## 2021-05-09 NOTE — Progress Notes (Signed)
BP 131/83    Pulse 64    Temp 98.2 F (36.8 C) (Oral)    Wt (!) 320 lb (145.2 kg)    SpO2 97%    BMI 53.25 kg/m    Subjective:    Patient ID: Michelle Jenkins, female    DOB: November 12, 1945, 76 y.o.   MRN: [id number]  HPI: Michelle Jenkins is a 76 y.o. female  Chief Complaint  Patient presents with   Medication Problem    Wants to talk about pantoprazole, wants to know if she should keep taking it or to stop taking.   GERD Patient presents to clinic today to discuss her pantoprazole.  Patient has been on medication for several years.  Understands that there are risks with continuing the medication.   Denies HA, CP, SOB, dizziness, palpitations, visual changes, and lower extremity swelling.   Relevant past medical, surgical, family and social history reviewed and updated as indicated. Interim medical history since our last visit reviewed. Allergies and medications reviewed and updated.  Review of Systems  Eyes:  Negative for visual disturbance.  Respiratory:  Negative for cough, chest tightness and shortness of breath.   Cardiovascular:  Negative for chest pain, palpitations and leg swelling.  Neurological:  Negative for dizziness and headaches.   Per HPI unless specifically indicated above     Objective:    BP 131/83    Pulse 64    Temp 98.2 F (36.8 C) (Oral)    Wt (!) 320 lb (145.2 kg)    SpO2 97%    BMI 53.25 kg/m   Wt Readings from Last 3 Encounters:  05/09/21 (!) 320 lb (145.2 kg)  12/14/20 (!) 320 lb (145.2 kg)  11/03/20 (!) 320 lb 8 oz (145.4 kg)    Physical Exam Vitals and nursing note reviewed.  Constitutional:      General: She is not in acute distress.    Appearance: Normal appearance. She is obese. She is not ill-appearing, toxic-appearing or diaphoretic.  HENT:     Head: Normocephalic.     Right Ear: External ear normal.     Left Ear: External ear normal.     Nose: Nose normal.     Mouth/Throat:     Mouth: Mucous membranes are moist.     Pharynx:  Oropharynx is clear.  Eyes:     General:        Right eye: No discharge.        Left eye: No discharge.     Extraocular Movements: Extraocular movements intact.     Conjunctiva/sclera: Conjunctivae normal.     Pupils: Pupils are equal, round, and reactive to light.  Cardiovascular:     Rate and Rhythm: Normal rate and regular rhythm.     Heart sounds: No murmur heard. Pulmonary:     Effort: Pulmonary effort is normal. No respiratory distress.     Breath sounds: Normal breath sounds. No wheezing or rales.  Musculoskeletal:     Cervical back: Normal range of motion and neck supple.  Skin:    General: Skin is warm and dry.     Capillary Refill: Capillary refill takes less than 2 seconds.  Neurological:     General: No focal deficit present.     Mental Status: She is alert and oriented to person, place, and time. Mental status is at baseline.  Psychiatric:        Mood and Affect: Mood normal.        Behavior: Behavior  normal.        Thought Content: Thought content normal.        Judgment: Judgment normal.    Results for orders placed or performed during the hospital encounter of 01/09/21  NM Myocar Multi W/Spect W/Wall Motion / EF  Result Value Ref Range   Rest HR 58.0 bpm   Rest BP 106/61 mmHg   Exercise duration (min) 1 min   Exercise duration (sec) 18 sec   Estimated workload 1.0    Peak HR 70 bpm   Peak BP 115/60 mmHg   MPHR 144 bpm   Percent HR 48.0 %   Rest Nuclear Isotope Dose 30.8 mCi   Stress Nuclear Isotope Dose 30.5 mCi   SSS 14.0    SRS 15.0    SDS 4.0    TID 0.88    LV sys vol 53.0 mL   LV dias vol 145.0 46 - 106 mL   Nuc Stress EF 63 %   Base ST Depression (mm) 0 mm   ST Depression (mm) 0 mm      Assessment & Plan:   Problem List Items Addressed This Visit       Digestive   GERD (gastroesophageal reflux disease) - Primary    Chronic.  Controlled.  Continue with current medication regimen on Protonix 40mg  daily.  Bone Density in 2016 was normal.   Return to clinic in 3 months for reevaluation.  Call sooner if concerns arise.          Follow up plan: Return in about 3 months (around 08/07/2021) for HTN, HLD, DM2 FU.

## 2021-05-09 NOTE — Assessment & Plan Note (Signed)
Chronic.  Controlled.  Continue with current medication regimen on Protonix 40mg  daily.  Bone Density in 2016 was normal.  Return to clinic in 3 months for reevaluation.  Call sooner if concerns arise.

## 2021-05-23 ENCOUNTER — Other Ambulatory Visit: Payer: Self-pay | Admitting: Nurse Practitioner

## 2021-05-23 NOTE — Telephone Encounter (Signed)
Requested Prescriptions  Pending Prescriptions Disp Refills   labetalol (NORMODYNE) 200 MG tablet [Pharmacy Med Name: LABETALOL 200MG  TABLETS] 180 tablet 0    Sig: TAKE 1 TABLET(200 MG) BY MOUTH TWICE DAILY     Cardiovascular:  Beta Blockers Passed - 05/23/2021  1:01 PM      Passed - Last BP in normal range    BP Readings from Last 1 Encounters:  05/09/21 131/83         Passed - Last Heart Rate in normal range    Pulse Readings from Last 1 Encounters:  05/09/21 64         Passed - Valid encounter within last 6 months    Recent Outpatient Visits          2 weeks ago Gastroesophageal reflux disease, unspecified whether esophagitis present   [facility] Jon Billings, NP   5 months ago Nausea   [facility] Jon Billings, NP   6 months ago Neuropathy   [place], NP   1 year ago Neuralgia   [facility] Kathrine Haddock, NP   1 year ago Primary hypertension   [facility] Kathrine Haddock, NP      Future Appointments            In 2 months Jon Billings, NP [facility], [place]

## 2021-05-25 ENCOUNTER — Other Ambulatory Visit: Payer: Self-pay | Admitting: Nurse Practitioner

## 2021-05-25 NOTE — Telephone Encounter (Signed)
Requested medication (s) are due for refill today: out of refills but has meds until 06/2021  Requested medication (s) are on the active medication list: yes  Last refill:  01/07/21 #90/1  Future visit scheduled: YES  Notes to clinic:  Unable to refill per protocol due to failed labs, no updated results.      Requested Prescriptions  Pending Prescriptions Disp Refills   atorvastatin (LIPITOR) 20 MG tablet [Pharmacy Med Name: ATORVASTATIN 20MG  TABLETS] 90 tablet 1    Sig: TAKE 1 TABLET(20 MG) BY MOUTH DAILY     Cardiovascular:  Antilipid - Statins Failed - 05/25/2021 10:04 AM      Failed - Lipid Panel in normal range within the last 12 months    Cholesterol, Total  Date Value Ref Range Status  01/28/2020 165 100 - 199 mg/dL Final   Cholesterol Piccolo, Waived  Date Value Ref Range Status  09/05/2016 154 <200 mg/dL Final    Comment:                            Desirable                <200                         Borderline High      200- 239                         High                     >239    LDL Chol Calc (NIH)  Date Value Ref Range Status  01/28/2020 75 0 - 99 mg/dL Final   HDL  Date Value Ref Range Status  01/28/2020 79 >39 mg/dL Final   Triglycerides  Date Value Ref Range Status  01/28/2020 56 0 - 149 mg/dL Final   Triglycerides Piccolo,Waived  Date Value Ref Range Status  09/05/2016 46 <150 mg/dL Final    Comment:                            Normal                   <150                         Borderline High     150 - 199                         High                200 - 499                         Very High                >499          Passed - Patient is not pregnant      Passed - Valid encounter within last 12 months    Recent Outpatient Visits           2 weeks ago Gastroesophageal reflux disease, unspecified whether esophagitis present   [place], NP   5 months ago Nausea   [facility]  Jon Billings, NP   6 months ago Neuropathy   [facility] Jon Billings, NP   1 year ago [place] Kathrine Haddock, NP   1 year ago Primary hypertension   [facility] Kathrine Haddock, NP       Future Appointments             In 2 months Jon Billings, NP [facility], [place]

## 2021-05-27 ENCOUNTER — Other Ambulatory Visit: Payer: Self-pay | Admitting: Nurse Practitioner

## 2021-05-29 NOTE — Telephone Encounter (Signed)
Requested medications are due for refill today. yes  Requested medications are on the active medications list.  yes  Last refill. 01/17/2021 #180  Future visit scheduled.   yes  Notes to clinic.  Failed protocol d/t expired labs.    Requested Prescriptions  Pending Prescriptions Disp Refills   gabapentin (NEURONTIN) 300 MG capsule [Pharmacy Med Name: GABAPENTIN 300MG  CAPSULES] 180 capsule 0    Sig: TAKE 2 CAPSULES(600 MG) BY MOUTH AT BEDTIME AS NEEDED     Neurology: Anticonvulsants - gabapentin Failed - 05/27/2021 10:33 AM      Failed - Cr in normal range and within 360 days    Creatinine, Ser  Date Value Ref Range Status  01/28/2020 1.16 (H) 0.57 - 1.00 mg/dL Final          Passed - Completed PHQ-2 or PHQ-9 in the last 360 days      Passed - Valid encounter within last 12 months    Recent Outpatient Visits           2 weeks ago Gastroesophageal reflux disease, unspecified whether esophagitis present   [place], NP   5 months ago Nausea   [facility] Jon Billings, NP   6 months ago Neuropathy   [place], NP   1 year ago Neuralgia   [facility] Kathrine Haddock, NP   1 year ago Primary hypertension   [facility] Kathrine Haddock, NP       Future Appointments             In 2 months Jon Billings, NP [facility], [place]

## 2021-06-22 ENCOUNTER — Telehealth: Payer: Self-pay | Admitting: Nurse Practitioner

## 2021-06-22 ENCOUNTER — Ambulatory Visit: Payer: Self-pay | Admitting: *Deleted

## 2021-06-22 NOTE — Telephone Encounter (Signed)
Pt is calling because she received a letter from [employer] to schedule her cologuard. Her last one was 06/02/2018 ? ?CB- [phone] ?

## 2021-06-22 NOTE — Telephone Encounter (Signed)
Per agent: ? ?"Pt is wanting advice- She receive a letter stating it was time to get her next cologuard. Pt would like to know is there an age that you stop screening for colon cancer?  ? ?Please advice"  ? ? ?Pt questioning "How long do people have to do this, what age?"  Advised pt of criteria, process.  Pt has appt 08/07/21 already scheduled.States she will discuss cologuard then.  ?Reason for Disposition ? Health Information question, no triage required and triager able to answer question ? ?Answer Assessment - Initial Assessment Questions ?1. REASON FOR CALL or QUESTION: "What is your reason for calling today?" or "How can I best help you?" or "What question do you have that I can help answer?" ?    Cologuard ? ?Protocols used: Information Only Call - No Triage-A-AH ? ?

## 2021-06-23 NOTE — Telephone Encounter (Signed)
Spoke with patient and notified her Karen's recommendations. Patient states she was informed by someone that she could wait until her appointment on 08/07/21 with Santiago Glad to discuss with provider. Patient verbalized understanding.  ?

## 2021-06-23 NOTE — Telephone Encounter (Signed)
There is not a specific age that is recommended to stop colon cancer screening.  It is recommended that if the life expectancy is more than 10 years that patient continue with the screening.   ? ?Cologuard is good for 3 years so I would recommend that she complete it. ?

## 2021-07-18 NOTE — Progress Notes (Signed)
? ?BP (!) 175/97   Pulse 64   Temp 98.6 ?F (37 ?C) (Oral)   Wt (!) 302 lb 6.4 oz (137.2 kg)   SpO2 96% Comment: 3L O2  BMI 50.32 kg/m?   ? ?Subjective:  ? ? Patient ID: Michelle Jenkins, female    DOB: 21-Apr-1945, 76 y.o.   MRN: [id number] ? ?HPI: ?Michelle Jenkins is a 76 y.o. female ? ?Chief Complaint  ?Patient presents with  ? Shoulder Pain  ?  Onset 1 month ago, denies recent fall/injury. Pain radiates down to arm. Difficulty with  ROM per patient.   ? ?SHOULDER PAIN ?Duration: months ?Involved shoulder: right ?Mechanism of injury: unknown ?Location: posterior ?Onset:sudden ?Severity: 10/10  ?Quality:   sore ?Frequency:  just when she is moving ?Radiation: no ?Aggravating factors: movement  ?Alleviating factors: rest  ?Status: worse ?Treatments attempted: rest  ?Relief with NSAIDs?:  No NSAIDs Taken ?Weakness: no ?Numbness: no ?Decreased grip strength: no ?Redness: no ?Swelling: no ?Bruising: no ?Fevers: no ? ?Relevant past medical, surgical, family and social history reviewed and updated as indicated. Interim medical history since our last visit reviewed. ?Allergies and medications reviewed and updated. ? ?Review of Systems  ?Musculoskeletal:   ?     Right shoulder pain  ? ?Per HPI unless specifically indicated above ? ?   ?Objective:  ?  ?BP (!) 175/97   Pulse 64   Temp 98.6 ?F (37 ?C) (Oral)   Wt (!) 302 lb 6.4 oz (137.2 kg)   SpO2 96% Comment: 3L O2  BMI 50.32 kg/m?   ?Wt Readings from Last 3 Encounters:  ?07/19/21 (!) 302 lb 6.4 oz (137.2 kg)  ?05/09/21 (!) 320 lb (145.2 kg)  ?12/14/20 (!) 320 lb (145.2 kg)  ?  ?Physical Exam ?Vitals and nursing note reviewed.  ?Constitutional:   ?   General: She is not in acute distress. ?   Appearance: Normal appearance. She is normal weight. She is not ill-appearing, toxic-appearing or diaphoretic.  ?HENT:  ?   Head: Normocephalic.  ?   Right Ear: External ear normal.  ?   Left Ear: External ear normal.  ?   Nose: Nose normal.  ?   Mouth/Throat:  ?   Mouth: Mucous  membranes are moist.  ?   Pharynx: Oropharynx is clear.  ?Eyes:  ?   General:     ?   Right eye: No discharge.     ?   Left eye: No discharge.  ?   Extraocular Movements: Extraocular movements intact.  ?   Conjunctiva/sclera: Conjunctivae normal.  ?   Pupils: Pupils are equal, round, and reactive to light.  ?Cardiovascular:  ?   Rate and Rhythm: Normal rate and regular rhythm.  ?   Heart sounds: No murmur heard. ?Pulmonary:  ?   Effort: Pulmonary effort is normal. No respiratory distress.  ?   Breath sounds: Normal breath sounds. No wheezing or rales.  ?Musculoskeletal:  ?   Right shoulder: No swelling, tenderness or bony tenderness. Decreased range of motion.  ?   Cervical back: Normal range of motion and neck supple.  ?Skin: ?   General: Skin is warm and dry.  ?   Capillary Refill: Capillary refill takes less than 2 seconds.  ?Neurological:  ?   General: No focal deficit present.  ?   Mental Status: She is alert and oriented to person, place, and time. Mental status is at baseline.  ?Psychiatric:     ?  Mood and Affect: Mood normal.     ?   Behavior: Behavior normal.     ?   Thought Content: Thought content normal.     ?   Judgment: Judgment normal.  ? ? ?Results for orders placed or performed during the hospital encounter of 01/09/21  ?NM Myocar Multi W/Spect W/Wall Motion / EF  ?Result Value Ref Range  ? Rest HR 58.0 bpm  ? Rest BP 106/61 mmHg  ? Exercise duration (min) 1 min  ? Exercise duration (sec) 18 sec  ? Estimated workload 1.0   ? Peak HR 70 bpm  ? Peak BP 115/60 mmHg  ? MPHR 144 bpm  ? Percent HR 48.0 %  ? Rest Nuclear Isotope Dose 30.8 mCi  ? Stress Nuclear Isotope Dose 30.5 mCi  ? SSS 14.0   ? SRS 15.0   ? SDS 4.0   ? TID 0.88   ? LV sys vol 53.0 mL  ? LV dias vol 145.0 46 - 106 mL  ? Nuc Stress EF 63 %  ? Base ST Depression (mm) 0 mm  ? ST Depression (mm) 0 mm  ? ?   ?Assessment & Plan:  ? ?Problem List Items Addressed This Visit   ?None ?Visit Diagnoses   ? ? Acute pain of right shoulder    -   Primary  ? Will give Toradol in office today. Will obtain xray for further evaluation.  Will amek rrecommendations based on imaging results.  ? Relevant Medications  ? ketorolac (TORADOL) injection 60 mg (Start on 07/19/2021 10:30 AM)  ? Other Relevant Orders  ? DG Shoulder Right  ? ?  ?  ? ?Follow up plan: ?Return if symptoms worsen or fail to improve. ? ? ? ? ? ?

## 2021-07-19 ENCOUNTER — Encounter: Payer: Self-pay | Admitting: Nurse Practitioner

## 2021-07-19 ENCOUNTER — Ambulatory Visit
Admission: RE | Admit: 2021-07-19 | Discharge: 2021-07-19 | Disposition: A | Discharge status: Home / Self Care | Payer: Medicare Other | Attending: Nurse Practitioner | Admitting: Nurse Practitioner

## 2021-07-19 ENCOUNTER — Ambulatory Visit (INDEPENDENT_AMBULATORY_CARE_PROVIDER_SITE_OTHER): Payer: Medicare Other | Admitting: Nurse Practitioner

## 2021-07-19 ENCOUNTER — Ambulatory Visit
Admission: RE | Admit: 2021-07-19 | Discharge: 2021-07-19 | Disposition: A | Discharge status: Home / Self Care | Payer: Medicare Other | Source: Ambulatory Visit | Attending: Nurse Practitioner | Admitting: Nurse Practitioner

## 2021-07-19 VITALS — BP 175/97 | HR 64 | Temp 98.6°F | Wt 302.4 lb

## 2021-07-19 DIAGNOSIS — M25511 Pain in right shoulder: Secondary | ICD-10-CM

## 2021-07-19 MED ORDER — KETOROLAC TROMETHAMINE 60 MG/2ML IM SOLN: 60.0000 mg | Freq: Once | INTRAMUSCULAR | 0 refills | Status: DC

## 2021-07-19 MED ORDER — KETOROLAC TROMETHAMINE 60 MG/2ML IM SOLN
60.0000 mg | Freq: Once | INTRAMUSCULAR | Status: AC
  Administered 2021-07-19: 60 mg via INTRAMUSCULAR

## 2021-07-21 NOTE — Addendum Note (Signed)
Addended by: Jon Billings on: 07/21/2021 10:17 AM ? ? Modules accepted: Orders ? ?

## 2021-07-21 NOTE — Progress Notes (Signed)
Referral placed for patient.  

## 2021-07-21 NOTE — Progress Notes (Signed)
Please let patient know that her shoulder xray is normal.  I recommend if she is still experiencing pain that she see Orthopedics.  I can place the referral if she agrees.

## 2021-07-24 ENCOUNTER — Telehealth: Payer: Self-pay | Admitting: Nurse Practitioner

## 2021-07-24 NOTE — Telephone Encounter (Signed)
Called [facility] to clarify medication. At patient's visit last week, Toradol was accidentally ordered as a prescription so the pharmacy has been trying to fill the medication. Medication was given at the office during the appointment. RX cancelled with the pharmacy. ? ?Will call and notify patient.  ?

## 2021-07-24 NOTE — Telephone Encounter (Signed)
Called and notified patient about medication. Apologized for the mix up.  ?

## 2021-07-24 NOTE — Telephone Encounter (Signed)
Copied from [place] [phone]. Topic: General - Other ?>> Jul 24, 2021 11:12 AM Yvette Rack wrote: ?Reason for CRM: Pt stated she went to get her prescription and she was informed that the Rx was for a shot that she has to give herself. Pt stated she can not give herself a shot and she would like the Rx that was sent to be changed to a tablet or capsule. ?

## 2021-08-04 NOTE — Progress Notes (Deleted)
? ?  There were no vitals taken for this visit.  ? ?Subjective:  ? ? Patient ID: Michelle Jenkins, female    DOB: 09-Sep-1945, 76 y.o.   MRN: [id number] ? ?HPI: ?Michelle Jenkins is a 76 y.o. female ? ?No chief complaint on file. ? ?HYPERTENSION / HYPERLIPIDEMIA ?Satisfied with current treatment? {Blank single:19197::"yes","no"} ?Duration of hypertension: {Blank single:19197::"chronic","months","years"} ?BP monitoring frequency: {Blank single:19197::"not checking","rarely","daily","weekly","monthly","a few times a day","a few times a week","a few times a month"} ?BP range:  ?BP medication side effects: {Blank single:19197::"yes","no"} ?Past BP meds: {Blank QQVZDGLO:75643::"PIRJ","JOACZYSAYT","KZSWFUXNAT/FTDDUKGURK","YHCWCBJS","EGBTDVVOHY","WVPXTGGYIR/SWNI","OEVOJJKKXF (bystolic)","carvedilol","chlorthalidone","clonidine","diltiazem","exforge HCT","HCTZ","irbesartan (avapro)","labetalol","lisinopril","lisinopril-HCTZ","losartan (cozaar)","methyldopa","nifedipine","olmesartan (benicar)","olmesartan-HCTZ","quinapril","ramipril","spironalactone","tekturna","valsartan","valsartan-HCTZ","verapamil"} ?Duration of hyperlipidemia: {Blank single:19197::"chronic","months","years"} ?Cholesterol medication side effects: {Blank single:19197::"yes","no"} ?Cholesterol supplements: {Blank multiple:19196::"none","fish oil","niacin","red yeast rice"} ?Past cholesterol medications: {Blank multiple:19196::"none","atorvastain (lipitor)","lovastatin (mevacor)","pravastatin (pravachol)","rosuvastatin (crestor)","simvastatin (zocor)","vytorin","fenofibrate (tricor)","gemfibrozil","ezetimide (zetia)","niaspan","lovaza"} ?Medication compliance: {Blank single:19197::"excellent compliance","good compliance","fair compliance","poor compliance"} ?Aspirin: {Blank single:19197::"yes","no"} ?Recent stressors: {Blank single:19197::"yes","no"} ?Recurrent headaches: {Blank single:19197::"yes","no"} ?Visual changes: {Blank  single:19197::"yes","no"} ?Palpitations: {Blank single:19197::"yes","no"} ?Dyspnea: {Blank single:19197::"yes","no"} ?Chest pain: {Blank single:19197::"yes","no"} ?Lower extremity edema: {Blank single:19197::"yes","no"} ?Dizzy/lightheaded: {Blank single:19197::"yes","no"} ? ?COPD ?COPD status: {Blank single:19197::"controlled","uncontrolled","better","worse","exacerbated","stable"} ?Satisfied with current treatment?: {Blank single:19197::"yes","no"} ?Oxygen use: {Blank single:19197::"yes","no"} ?Dyspnea frequency:  ?Cough frequency:  ?Rescue inhaler frequency:   ?Limitation of activity: {Blank single:19197::"yes","no"} ?Productive cough:  ?Last Spirometry:  ?Pneumovax: {Blank single:19197::"Up to Date","Not up to Date","unknown"} ?Influenza: {Blank single:19197::"Up to Date","Not up to Date","unknown"} ? ? ?Relevant past medical, surgical, family and social history reviewed and updated as indicated. Interim medical history since our last visit reviewed. ?Allergies and medications reviewed and updated. ? ?Review of Systems ? ?Per HPI unless specifically indicated above ? ?   ?Objective:  ?  ?There were no vitals taken for this visit.  ?Wt Readings from Last 3 Encounters:  ?07/19/21 (!) 302 lb 6.4 oz (137.2 kg)  ?05/09/21 (!) 320 lb (145.2 kg)  ?12/14/20 (!) 320 lb (145.2 kg)  ?  ?Physical Exam ? ?Results for orders placed or performed during the hospital encounter of 01/09/21  ?NM Myocar Multi W/Spect W/Wall Motion / EF  ?Result Value Ref Range  ? Rest HR 58.0 bpm  ? Rest BP 106/61 mmHg  ? Exercise duration (min) 1 min  ? Exercise duration (sec) 18 sec  ? Estimated workload 1.0   ? Peak HR 70 bpm  ? Peak BP 115/60 mmHg  ? MPHR 144 bpm  ? Percent HR 48.0 %  ? Rest Nuclear Isotope Dose 30.8 mCi  ? Stress Nuclear Isotope Dose 30.5 mCi  ? SSS 14.0   ? SRS 15.0   ? SDS 4.0   ? TID 0.88   ? LV sys vol 53.0 mL  ? LV dias vol 145.0 46 - 106 mL  ? Nuc Stress EF 63 %  ? Base ST Depression (mm) 0 mm  ? ST Depression (mm) 0 mm   ? ?   ?Assessment & Plan:  ? ?Problem List Items Addressed This Visit   ? ?  ? Cardiovascular and Mediastinum  ? CAD in native artery  ? Hypertension  ? Pulmonary artery hypertension ([place]) - Primary  ? Atrial fibrillation ([place])  ? CHF (congestive heart failure) ([place])  ?  ? Digestive  ? GERD (gastroesophageal reflux disease)  ?  ? Endocrine  ? IFG (impaired fasting glucose)  ?  ? Other  ? Hypercholesteremia  ?  ? ?Follow up plan: ?No follow-ups on file. ? ? ? ? ? ?

## 2021-08-07 ENCOUNTER — Encounter: Payer: Self-pay | Admitting: Nurse Practitioner

## 2021-08-07 ENCOUNTER — Ambulatory Visit (INDEPENDENT_AMBULATORY_CARE_PROVIDER_SITE_OTHER): Payer: Medicare Other | Admitting: Nurse Practitioner

## 2021-08-07 VITALS — BP 121/77 | HR 84 | Temp 98.7°F | Ht 65.0 in | Wt 297.6 lb

## 2021-08-07 DIAGNOSIS — Z0000 Encounter for general adult medical examination without abnormal findings: Secondary | ICD-10-CM | POA: Diagnosis not present

## 2021-08-07 DIAGNOSIS — K219 Gastro-esophageal reflux disease without esophagitis: Secondary | ICD-10-CM

## 2021-08-07 DIAGNOSIS — I2510 Atherosclerotic heart disease of native coronary artery without angina pectoris: Secondary | ICD-10-CM | POA: Diagnosis not present

## 2021-08-07 DIAGNOSIS — I10 Essential (primary) hypertension: Secondary | ICD-10-CM

## 2021-08-07 DIAGNOSIS — E7800 Pure hypercholesterolemia, unspecified: Secondary | ICD-10-CM

## 2021-08-07 DIAGNOSIS — I509 Heart failure, unspecified: Secondary | ICD-10-CM | POA: Diagnosis not present

## 2021-08-07 DIAGNOSIS — I2721 Secondary pulmonary arterial hypertension: Secondary | ICD-10-CM

## 2021-08-07 DIAGNOSIS — I4891 Unspecified atrial fibrillation: Secondary | ICD-10-CM

## 2021-08-07 DIAGNOSIS — R7301 Impaired fasting glucose: Secondary | ICD-10-CM

## 2021-08-07 LAB — URINALYSIS, ROUTINE W REFLEX MICROSCOPIC
Bilirubin, UA: NEGATIVE
Glucose, UA: NEGATIVE
Ketones, UA: NEGATIVE
Leukocytes,UA: NEGATIVE
Nitrite, UA: NEGATIVE
Protein,UA: NEGATIVE
RBC, UA: NEGATIVE
Specific Gravity, UA: 1.02 (ref 1.005–1.030)
Urobilinogen, Ur: 0.2 mg/dL (ref 0.2–1.0)
pH, UA: 5.5 (ref 5.0–7.5)

## 2021-08-07 NOTE — Progress Notes (Signed)
? ?BP 121/77   Pulse 84   Temp 98.7 ?F (37.1 ?C) (Oral)   Ht '5\' 5"'$  (1.651 m)   Wt 297 lb 9.6 oz (135 kg)   SpO2 98%   BMI 49.52 kg/m?   ? ?Subjective:  ? ? Patient ID: Michelle Jenkins, female    DOB: Aug 21, 1945, 76 y.o.   MRN: [id number] ? ?HPI: ?Michelle Jenkins is a 76 y.o. female presenting on 08/07/2021 for comprehensive medical examination. Current medical complaints include:none ? ?She currently lives with: ?Menopausal Symptoms: no ? ?HYPERTENSION / HYPERLIPIDEMIA ?Satisfied with current treatment? yes ?Duration of hypertension: years ?BP monitoring frequency: not checking ?BP range:  ?BP medication side effects: no ?Past BP meds: olmesartan-HCTZ, Lasix ?Duration of hyperlipidemia: years ?Cholesterol medication side effects: no ?Cholesterol supplements: none ?Past cholesterol medications: atorvastain (lipitor) ?Medication compliance: excellent compliance ?Aspirin: no ?Recent stressors: no ?Recurrent headaches: no ?Visual changes: no ?Palpitations: no ?Dyspnea: yes ?Chest pain: no ?Lower extremity edema: no ?Dizzy/lightheaded: no ? ?COPD ?COPD status: controlled ?Satisfied with current treatment?: yes ?Oxygen use: yes (3L) ?Dyspnea frequency: SOB ?Cough frequency: Sometimes ?Rescue inhaler frequency:   ?Limitation of activity: yes ?Productive cough: yes ?Last Spirometry:  ?Pneumovax: Up to Date ?Influenza: Up to Date ? ?Depression Screen done today and results listed below:  ? ?  02/22/2021  ? 11:01 AM 02/19/2020  ?  1:05 PM 02/12/2019  ? 10:23 AM 01/16/2018  ?  9:53 AM 12/12/2017  ? 10:49 AM  ?Depression screen PHQ 2/9  ?Decreased Interest 0 0 0 2 0  ?Down, Depressed, Hopeless 0 0 0 2 0  ?PHQ - 2 Score 0 0 0 4 0  ?Altered sleeping    1 2  ?Tired, decreased energy    1 2  ?Change in appetite    3 3  ?Feeling bad or failure about yourself     1 3  ?Trouble concentrating    0 0  ?Moving slowly or fidgety/restless    0 0  ?Suicidal thoughts    0 0  ?PHQ-9 Score    10 10  ?Difficult doing work/chores    Not  difficult at all   ? ? ?The patient does not have a history of falls. I did complete a risk assessment for falls. A plan of care for falls was documented. ? ? ?Past Medical History:  ?Past Medical History:  ?Diagnosis Date  ? Anginal pain ([place])   ? Asthma   ? Bronchitis   ? CAD (coronary artery disease)   ? CHF (congestive heart failure) ([place])   ? CVA (cerebral infarction)   ? Dysrhythmia   ? GERD (gastroesophageal reflux disease)   ? History of chronic atrial fibrillation   ? Hypercholesteremia   ? Hypertension   ? Idiopathic pulmonary hypertension ([place])   ? Myocardial infarction [facility])   ? Obesity   ? OSA (obstructive sleep apnea)   ? Pre-diabetes   ? Pulmonary artery hypertension ([place])   ? Shortness of breath dyspnea   ? Sleep apnea   ? Stroke [facility])   ? ? ?Surgical History:  ?Past Surgical History:  ?Procedure Laterality Date  ? BREAST BIOPSY Right 11/15/2015  ? stereo, COMPLEX SCLEROSING LESION WITH INTRADUCTAL PAPILLOMA COMPONENT AND   ? BREAST EXCISIONAL BIOPSY Right 02/27/2016  ? NEG  ? BREAST LUMPECTOMY WITH NEEDLE LOCALIZATION Right 02/27/2016  ? Procedure: BREAST LUMPECTOMY WITH NEEDLE LOCALIZATION;  Surgeon: Christene Lye, MD;  Location: [facility] ORS;  Service: General;  Laterality: Right;  ?  CARDIAC CATHETERIZATION    ? CARDIAC CATHETERIZATION Right 12/07/2014  ? Procedure: Right and Left Heart Cath with Coronary Angiography;  Surgeon: Teodoro Spray, MD;  Location: [facility];  Service: Cardiovascular;  Laterality: Right;  ? [place]  ? CATARACT EXTRACTION W/PHACO Right 09/24/2019  ? Procedure: CATARACT EXTRACTION PHACO AND INTRAOCULAR LENS PLACEMENT ([place]) RIGHT;  Surgeon: Birder Robson, MD;  Location: [facility] ORS;  Service: Ophthalmology;  Laterality: Right;  [id number] ?us6.96 ?[id number]  ? COLONOSCOPY  10/31/1998  ? DILATION AND CURETTAGE OF UTERUS    ? FOOT SURGERY Right   ? HYSTEROSCOPY    ? JOINT REPLACEMENT Left   ? KNEE ARTHROSCOPY AND ARTHROTOMY Right    ? lt knee replacement    ? TONSILLECTOMY    ? UVULOPALATOPHARYNGOPLASTY    ? ? ?Medications:  ?Current Outpatient Medications on File Prior to Visit  ?Medication Sig  ? ambrisentan (LETAIRIS) 10 MG tablet Take 10 mg by mouth daily.  ? atorvastatin (LIPITOR) 20 MG tablet TAKE 1 TABLET(20 MG) BY MOUTH DAILY  ? ELIQUIS 5 MG TABS tablet TAKE 1 TABLET BY MOUTH TWICE DAILY  ? ezetimibe (ZETIA) 10 MG tablet TAKE 1 TABLET(10 MG) BY MOUTH DAILY  ? furosemide (LASIX) 20 MG tablet Take 1 tablet (20 mg total) by mouth 2 (two) times daily. TAKE 1 TABLET(20 MG) BY MOUTH TWICE DAILY AS NEEDED  ? gabapentin (NEURONTIN) 300 MG capsule TAKE 2 CAPSULES(600 MG) BY MOUTH AT BEDTIME AS NEEDED  ? labetalol (NORMODYNE) 200 MG tablet TAKE 1 TABLET(200 MG) BY MOUTH TWICE DAILY  ? olmesartan-hydrochlorothiazide (BENICAR HCT) 20-12.5 MG tablet TAKE 1 TABLET BY MOUTH DAILY  ? Omega-3 Fatty Acids (FISH OIL PO) Take 1 Dose by mouth daily.  ? ondansetron (ZOFRAN ODT) 4 MG disintegrating tablet Take 1 tablet (4 mg total) by mouth every 8 (eight) hours as needed for nausea or vomiting.  ? pantoprazole (PROTONIX) 40 MG tablet TAKE 1 TABLET(40 MG) BY MOUTH DAILY  ? potassium chloride (KLOR-CON) 10 MEQ tablet TAKE 1 TABLET(10 MEQ) BY MOUTH EVERY DAY  ? Psyllium (METAMUCIL FIBER PO) Take 1 Dose by mouth. Patient uses Metamucil Powder in the AM  ? VENTOLIN HFA 108 (90 Base) MCG/ACT inhaler   ? XYLIMELTS 550 MG DISK DISSOLVE 1 TABLET ON THE TONGUE EVERY 8 HOURS AS NEEDED FOR DRY MOUTH.  ? ?No current facility-administered medications on file prior to visit.  ? ? ?Allergies:  ?Allergies  ?Allergen Reactions  ? Ace Inhibitors Cough  ? Nyquil Multi-Symptom [Pseudoeph-Doxylamine-Dm-Apap] Itching  ? Tylenol [Acetaminophen] Itching  ? ? ?Social History:  ?Social History  ? ?Socioeconomic History  ? Marital status: Single  ?  Spouse name: Not on file  ? Number of children: Not on file  ? Years of education: Not on file  ? Highest education level: 8th grade   ?Occupational History  ? Occupation: retired  ?Tobacco Use  ? Smoking status: Former  ?  Packs/day: 0.25  ?  Types: Cigarettes  ?  Quit date: 06/15/2014  ?  Years since quitting: 7.1  ? Smokeless tobacco: Never  ? Tobacco comments:  ?  4-5 cigarettes a day when she smoked  ?Vaping Use  ? Vaping Use: Never used  ?Substance and Sexual Activity  ? Alcohol use: Yes  ?  Alcohol/week: 0.0 - 1.0 standard drinks  ?  Comment: occasionally  ? Drug use: No  ? Sexual activity: Not Currently  ?Other Topics Concern  ?  Not on file  ?Social History Narrative  ? Not on file  ? ?Social Determinants of Health  ? ?Financial Resource Strain: Low Risk   ? Difficulty of Paying Living Expenses: Not very hard  ?Food Insecurity: No Food Insecurity  ? Worried About Charity fundraiser in the Last Year: Never true  ? Ran Out of Food in the Last Year: Never true  ?Transportation Needs: No Transportation Needs  ? Lack of Transportation (Medical): No  ? Lack of Transportation (Non-Medical): No  ?Physical Activity: Inactive  ? Days of Exercise per Week: 0 days  ? Minutes of Exercise per Session: 0 min  ?Stress: No Stress Concern Present  ? Feeling of Stress : Not at all  ?Social Connections: Unknown  ? Frequency of Communication with Friends and Family: Three times a week  ? Frequency of Social Gatherings with Friends and Family: Three times a week  ? Attends Religious Services: 1 to 4 times per year  ? Active Member of Clubs or Organizations: No  ? Attends Archivist Meetings: Never  ? Marital Status: Not on file  ?Intimate Partner Violence: Not At Risk  ? Fear of Current or Ex-Partner: No  ? Emotionally Abused: No  ? Physically Abused: No  ? Sexually Abused: No  ? ?Social History  ? ?Tobacco Use  ?Smoking Status Former  ? Packs/day: 0.25  ? Types: Cigarettes  ? Quit date: 06/15/2014  ? Years since quitting: 7.1  ?Smokeless Tobacco Never  ?Tobacco Comments  ? 4-5 cigarettes a day when she smoked  ? ?Social History  ? ?Substance and Sexual  Activity  ?Alcohol Use Yes  ? Alcohol/week: 0.0 - 1.0 standard drinks  ? Comment: occasionally  ? ? ?Family History:  ?Family History  ?Problem Relation Age of Onset  ? Diabetes Mother   ? Alzheimer's disease Mother

## 2021-08-07 NOTE — Assessment & Plan Note (Signed)
Chronic.  Controlled.  Continue with current medication regimen.  Continue with Eliquis.  Continue to follow up with Cardiology.  Labs ordered today.  Return to clinic in 3 months for reevaluation.  Call sooner if concerns arise.   

## 2021-08-07 NOTE — Assessment & Plan Note (Signed)
Chronic.  Followed on Cath in 2006.  Continue Eliquis and Atorvastatin. Continue to collaborate with Cardiology.  ?

## 2021-08-07 NOTE — Assessment & Plan Note (Signed)
Chronic.  Controlled.  Continue with current medication regimen.  Continue to collaborate with Cardiology.  Labs ordered today.  Return to clinic in 3 months for reevaluation.  Call sooner if concerns arise.   

## 2021-08-07 NOTE — Assessment & Plan Note (Signed)
Chronic.  Controlled.  Continue with current medication regimen of Atorvastatin 20mg daily.  Labs ordered today.  Return to clinic in 6 months for reevaluation.  Call sooner if concerns arise.   

## 2021-08-07 NOTE — Assessment & Plan Note (Signed)
Chronic. Stable at 3L.  Seeing pulmonary and Cardiology.  Continue to collaborate with Specialists. 

## 2021-08-07 NOTE — Assessment & Plan Note (Signed)
Labs ordered today.  Will make recommendations based on lab results. ?

## 2021-08-07 NOTE — Assessment & Plan Note (Signed)
Chronic.  Controlled.  Continue with current medication regimen of Protonix daily.  Labs ordered today.  Return to clinic in 3 months for reevaluation.  Call sooner if concerns arise.  ? ?

## 2021-08-07 NOTE — Assessment & Plan Note (Signed)
Chronic.  Controlled.  Continue with current medication regimen of Olmesartan, HCTZ, Labetolol, and Lasix.  Labs ordered today.  Return to clinic in 3 months for reevaluation.  Call sooner if concerns arise.  ? ?

## 2021-08-08 LAB — COMPREHENSIVE METABOLIC PANEL
ALT: 21 IU/L (ref 0–32)
AST: 25 IU/L (ref 0–40)
Albumin/Globulin Ratio: 1.9 (ref 1.2–2.2)
Albumin: 4.2 g/dL (ref 3.7–4.7)
Alkaline Phosphatase: 66 IU/L (ref 44–121)
BUN/Creatinine Ratio: 25 (ref 12–28)
BUN: 25 mg/dL (ref 8–27)
Bilirubin Total: 0.6 mg/dL (ref 0.0–1.2)
CO2: 25 mmol/L (ref 20–29)
Calcium: 9.3 mg/dL (ref 8.7–10.3)
Chloride: 104 mmol/L (ref 96–106)
Creatinine, Ser: 1.02 mg/dL — ABNORMAL HIGH (ref 0.57–1.00)
Globulin, Total: 2.2 g/dL (ref 1.5–4.5)
Glucose: 106 mg/dL — ABNORMAL HIGH (ref 70–99)
Potassium: 4 mmol/L (ref 3.5–5.2)
Sodium: 144 mmol/L (ref 134–144)
Total Protein: 6.4 g/dL (ref 6.0–8.5)
eGFR: 57 mL/min/{1.73_m2} — ABNORMAL LOW (ref >59)

## 2021-08-08 LAB — HEMOGLOBIN A1C
Est. average glucose Bld gHb Est-mCnc: 131 mg/dL
Hgb A1c MFr Bld: 6.2 % — ABNORMAL HIGH (ref 4.8–5.6)

## 2021-08-08 LAB — CBC WITH DIFFERENTIAL/PLATELET
Basophils Absolute: 0 10*3/uL (ref 0.0–0.2)
Basos: 1 %
EOS (ABSOLUTE): 0.2 10*3/uL (ref 0.0–0.4)
Eos: 3 %
Hematocrit: 36 % (ref 34.0–46.6)
Hemoglobin: 11.8 g/dL (ref 11.1–15.9)
Immature Grans (Abs): 0 10*3/uL (ref 0.0–0.1)
Immature Granulocytes: 0 %
Lymphocytes Absolute: 2.2 10*3/uL (ref 0.7–3.1)
Lymphs: 33 %
MCH: 31.5 pg (ref 26.6–33.0)
MCHC: 32.8 g/dL (ref 31.5–35.7)
MCV: 96 fL (ref 79–97)
Monocytes Absolute: 0.7 10*3/uL (ref 0.1–0.9)
Monocytes: 10 %
Neutrophils Absolute: 3.5 10*3/uL (ref 1.4–7.0)
Neutrophils: 53 %
Platelets: 161 10*3/uL (ref 150–450)
RBC: 3.75 x10E6/uL — ABNORMAL LOW (ref 3.77–5.28)
RDW: 13.2 % (ref 11.7–15.4)
WBC: 6.6 10*3/uL (ref 3.4–10.8)

## 2021-08-08 LAB — LIPID PANEL
Chol/HDL Ratio: 2 ratio (ref 0.0–4.4)
Cholesterol, Total: 151 mg/dL (ref 100–199)
HDL: 77 mg/dL (ref >39)
LDL Chol Calc (NIH): 61 mg/dL (ref 0–99)
Triglycerides: 63 mg/dL (ref 0–149)
VLDL Cholesterol Cal: 13 mg/dL (ref 5–40)

## 2021-08-08 LAB — TSH: TSH: 3.18 u[IU]/mL (ref 0.450–4.500)

## 2021-08-08 NOTE — Progress Notes (Signed)
Please let patient know that overall her lab work looks good.  Her A1c is well controlled at 6.2.  Keep up the good work.  Her kidney function remains stable.  Cholesterol and thyroid are within normal range.  Continue with current medication regimen.  Follow up as discussed.

## 2021-08-17 ENCOUNTER — Ambulatory Visit: Payer: Self-pay

## 2021-08-17 NOTE — Telephone Encounter (Signed)
Please schedule her an appt with me. ?

## 2021-08-17 NOTE — Telephone Encounter (Signed)
?  Chief Complaint: Arm pain - into side ?Symptoms: arm pain ?Frequency: ongoing ?Pertinent Negatives: Patient denies Chest pain/sob ?Disposition: '[]'$ ED /'[]'$ Urgent Care (no appt availability in office) / '[]'$ Appointment(In office/virtual)/ '[]'$  [facility]/ '[]'$ Home Care/ '[]'$ Refused Recommended Disposition /'[]'$ [place] [facility]/ '[x]'$  Follow-up with PCP ?Additional Notes: Pt called with complaint that sounded like difficulty breathing to agent. Pt states that she has no true difficulty breathing; she has a feeling of tightness on right side as an aspect of her arm pain.  Pt is followed by pulmonology for COPD and has no SOB more than usual. Pt does have side pain which she attributes to ongoing arm pain.  ? ?Pt was calling to get another injection into arm to help with with pain. Please follow up with pt to schedule. ? ?Reason for Disposition ? Arm pain ? ?Answer Assessment - Initial Assessment Questions ?1. ONSET: "When did the pain start?" ?    Ongoing ?2. LOCATION: "Where is the pain located?" ?    Right arm ?3. PAIN: "How bad is the pain?" (Scale 1-10; or mild, moderate, severe) ?  - MILD (1-3): doesn't interfere with normal activities ?  - MODERATE (4-7): interferes with normal activities (e.g., work or school) or awakens from sleep ?  - SEVERE (8-10): excruciating pain, unable to do any normal activities, unable to hold a cup of water ?    5 ?4. WORK OR EXERCISE: "Has there been any recent work or exercise that involved this part of the body?" ? ?     ?5. CAUSE: "What do you think is causing the arm pain?" Arthritis ?     ?6. OTHER SYMPTOMS: "Do you have any other symptoms?" (e.g., neck pain, swelling, rash, fever, numbness, weakness) ?     ?7. PREGNANCY: "Is there any chance you are pregnant?" "When was your last menstrual period?" NA ? ?Protocols used: Arm Pain-A-AH ? ?

## 2021-08-17 NOTE — Telephone Encounter (Signed)
Pt scheduled  

## 2021-08-21 NOTE — Progress Notes (Signed)
? ?BP (!) 155/76 Comment: Pt states she did not take BP meds today  Pulse 64   Temp 98.3 ?F (36.8 ?C) (Oral)   Wt (!) 301 lb 9.6 oz (136.8 kg)   SpO2 96% Comment: 3L O2  BMI 50.19 kg/m?   ? ?Subjective:  ? ? Patient ID: Michelle Jenkins, female    DOB: 04/02/1946, 76 y.o.   MRN: [id number] ? ?HPI: ?Michelle Jenkins is a 76 y.o. female ? ?Chief Complaint  ?Patient presents with  ? Arm Pain  ?  R arm pain onset x 2 months. Patient is wanting "shots" in the arm.   ? ?SHOULDER PAIN ?Patient was seen [facility] who gave her a kenalog shot.  States it helped some but not a lot.  She was given Toradol in office did not give her any relief.  ?Duration: months ?Involved shoulder: right ?Mechanism of injury: unknown ?Location: posterior ?Onset:sudden ?Severity: 10/10  ?Quality:   sore ?Frequency:  just when she is moving ?Radiation: no ?Aggravating factors: movement  ?Alleviating factors: rest  ?Status: worse ?Treatments attempted: rest  ?Relief with NSAIDs?:  No NSAIDs Taken ?Weakness: no ?Numbness: no ?Decreased grip strength: no ?Redness: no ?Swelling: no ?Bruising: no ?Fevers: no ? ?Relevant past medical, surgical, family and social history reviewed and updated as indicated. Interim medical history since our last visit reviewed. ?Allergies and medications reviewed and updated. ? ?Review of Systems  ?Musculoskeletal:   ?     Right shoulder pain  ? ?Per HPI unless specifically indicated above ? ?   ?Objective:  ?  ?BP (!) 155/76 Comment: Pt states she did not take BP meds today  Pulse 64   Temp 98.3 ?F (36.8 ?C) (Oral)   Wt (!) 301 lb 9.6 oz (136.8 kg)   SpO2 96% Comment: 3L O2  BMI 50.19 kg/m?   ?Wt Readings from Last 3 Encounters:  ?08/22/21 (!) 301 lb 9.6 oz (136.8 kg)  ?08/07/21 297 lb 9.6 oz (135 kg)  ?07/19/21 (!) 302 lb 6.4 oz (137.2 kg)  ?  ?Physical Exam ?Vitals and nursing note reviewed.  ?Constitutional:   ?   General: She is not in acute distress. ?   Appearance: Normal appearance. She is normal weight.  She is not ill-appearing, toxic-appearing or diaphoretic.  ?HENT:  ?   Head: Normocephalic.  ?   Right Ear: External ear normal.  ?   Left Ear: External ear normal.  ?   Nose: Nose normal.  ?   Mouth/Throat:  ?   Mouth: Mucous membranes are moist.  ?   Pharynx: Oropharynx is clear.  ?Eyes:  ?   General:     ?   Right eye: No discharge.     ?   Left eye: No discharge.  ?   Extraocular Movements: Extraocular movements intact.  ?   Conjunctiva/sclera: Conjunctivae normal.  ?   Pupils: Pupils are equal, round, and reactive to light.  ?Cardiovascular:  ?   Rate and Rhythm: Normal rate and regular rhythm.  ?   Heart sounds: No murmur heard. ?Pulmonary:  ?   Effort: Pulmonary effort is normal. No respiratory distress.  ?   Breath sounds: Normal breath sounds. No wheezing or rales.  ?Musculoskeletal:  ?   Right shoulder: No swelling, tenderness or bony tenderness. Decreased range of motion.  ?   Cervical back: Normal range of motion and neck supple.  ?Skin: ?   General: Skin is warm and dry.  ?  Capillary Refill: Capillary refill takes less than 2 seconds.  ?Neurological:  ?   General: No focal deficit present.  ?   Mental Status: She is alert and oriented to person, place, and time. Mental status is at baseline.  ?Psychiatric:     ?   Mood and Affect: Mood normal.     ?   Behavior: Behavior normal.     ?   Thought Content: Thought content normal.     ?   Judgment: Judgment normal.  ? ? ?Results for orders placed or performed in visit on 08/07/21  ?HgB A1c  ?Result Value Ref Range  ? Hgb A1c MFr Bld 6.2 (H) 4.8 - 5.6 %  ? Est. average glucose Bld gHb Est-mCnc 131 mg/dL  ?CBC with Differential/Platelet  ?Result Value Ref Range  ? WBC 6.6 3.4 - 10.8 x10E3/uL  ? RBC 3.75 (L) 3.77 - 5.28 x10E6/uL  ? Hemoglobin 11.8 11.1 - 15.9 g/dL  ? Hematocrit 36.0 34.0 - 46.6 %  ? MCV 96 79 - 97 fL  ? MCH 31.5 26.6 - 33.0 pg  ? MCHC 32.8 31.5 - 35.7 g/dL  ? RDW 13.2 11.7 - 15.4 %  ? Platelets 161 150 - 450 x10E3/uL  ? Neutrophils 53 Not  Estab. %  ? Lymphs 33 Not Estab. %  ? Monocytes 10 Not Estab. %  ? Eos 3 Not Estab. %  ? Basos 1 Not Estab. %  ? Neutrophils Absolute 3.5 1.4 - 7.0 x10E3/uL  ? Lymphocytes Absolute 2.2 0.7 - 3.1 x10E3/uL  ? Monocytes Absolute 0.7 0.1 - 0.9 x10E3/uL  ? EOS (ABSOLUTE) 0.2 0.0 - 0.4 x10E3/uL  ? Basophils Absolute 0.0 0.0 - 0.2 x10E3/uL  ? Immature Granulocytes 0 Not Estab. %  ? Immature Grans (Abs) 0.0 0.0 - 0.1 x10E3/uL  ?Comprehensive metabolic panel  ?Result Value Ref Range  ? Glucose 106 (H) 70 - 99 mg/dL  ? BUN 25 8 - 27 mg/dL  ? Creatinine, Ser 1.02 (H) 0.57 - 1.00 mg/dL  ? eGFR 57 (L) >59 mL/min/1.73  ? BUN/Creatinine Ratio 25 12 - 28  ? Sodium 144 134 - 144 mmol/L  ? Potassium 4.0 3.5 - 5.2 mmol/L  ? Chloride 104 96 - 106 mmol/L  ? CO2 25 20 - 29 mmol/L  ? Calcium 9.3 8.7 - 10.3 mg/dL  ? Total Protein 6.4 6.0 - 8.5 g/dL  ? Albumin 4.2 3.7 - 4.7 g/dL  ? Globulin, Total 2.2 1.5 - 4.5 g/dL  ? Albumin/Globulin Ratio 1.9 1.2 - 2.2  ? Bilirubin Total 0.6 0.0 - 1.2 mg/dL  ? Alkaline Phosphatase 66 44 - 121 IU/L  ? AST 25 0 - 40 IU/L  ? ALT 21 0 - 32 IU/L  ?Lipid panel  ?Result Value Ref Range  ? Cholesterol, Total 151 100 - 199 mg/dL  ? Triglycerides 63 0 - 149 mg/dL  ? HDL 77 >39 mg/dL  ? VLDL Cholesterol Cal 13 5 - 40 mg/dL  ? LDL Chol Calc (NIH) 61 0 - 99 mg/dL  ? Chol/HDL Ratio 2.0 0.0 - 4.4 ratio  ?TSH  ?Result Value Ref Range  ? TSH 3.180 0.450 - 4.500 uIU/mL  ?Urinalysis, Routine w reflex microscopic  ?Result Value Ref Range  ? Specific Gravity, UA 1.020 1.005 - 1.030  ? pH, UA 5.5 5.0 - 7.5  ? Color, UA Yellow Yellow  ? Appearance Ur Cloudy (A) Clear  ? Leukocytes,UA Negative Negative  ? Protein,UA Negative Negative/Trace  ? Glucose,  UA Negative Negative  ? Ketones, UA Negative Negative  ? RBC, UA Negative Negative  ? Bilirubin, UA Negative Negative  ? Urobilinogen, Ur 0.2 0.2 - 1.0 mg/dL  ? Nitrite, UA Negative Negative  ? ?   ?Assessment & Plan:  ? ?Problem List Items Addressed This Visit   ?None ?   ? ?Follow up plan: ?No follow-ups on file. ? ? ? ? ? ?

## 2021-08-22 ENCOUNTER — Ambulatory Visit (INDEPENDENT_AMBULATORY_CARE_PROVIDER_SITE_OTHER): Payer: Medicare Other | Admitting: Nurse Practitioner

## 2021-08-22 ENCOUNTER — Encounter: Payer: Self-pay | Admitting: Nurse Practitioner

## 2021-08-22 VITALS — BP 138/82 | HR 64 | Temp 98.3°F | Wt 301.6 lb

## 2021-08-22 DIAGNOSIS — Z9981 Dependence on supplemental oxygen: Secondary | ICD-10-CM | POA: Diagnosis not present

## 2021-08-22 DIAGNOSIS — M25511 Pain in right shoulder: Secondary | ICD-10-CM | POA: Diagnosis not present

## 2021-08-23 ENCOUNTER — Other Ambulatory Visit: Payer: Self-pay | Admitting: Nurse Practitioner

## 2021-08-23 NOTE — Telephone Encounter (Signed)
Requested Prescriptions  ?Pending Prescriptions Disp Refills  ?? labetalol (NORMODYNE) 200 MG tablet [Pharmacy Med Name: LABETALOL '200MG'$  TABLETS] 180 tablet 0  ?  Sig: TAKE 1 TABLET(200 MG) BY MOUTH TWICE DAILY  ?  ? Cardiovascular:  Beta Blockers Passed - 08/23/2021 12:03 PM  ?  ?  Passed - Last BP in normal range  ?  BP Readings from Last 1 Encounters:  ?08/22/21 138/82  ?   ?  ?  Passed - Last Heart Rate in normal range  ?  Pulse Readings from Last 1 Encounters:  ?08/22/21 64  ?   ?  ?  Passed - Valid encounter within last 6 months  ?  Recent Outpatient Visits   ?      ? Yesterday Acute pain of right shoulder  ? [place], NP  ? 2 weeks ago Annual physical exam  ? [place], NP  ? 1 month ago Acute pain of right shoulder  ? [place], NP  ? 3 months ago Gastroesophageal reflux disease, unspecified whether esophagitis present  ? [place], NP  ? 8 months ago Nausea  ? [facility] Jon Billings, NP  ?  ?  ?Future Appointments   ?        ? In 2 months Jon Billings, NP [facility]  ?  ? ?  ?  ?  ? ?

## 2021-08-25 NOTE — Addendum Note (Signed)
Addended by: Jon Billings on: 08/25/2021 01:20 PM ? ? Modules accepted: Orders ? ?

## 2021-08-26 ENCOUNTER — Other Ambulatory Visit: Payer: Self-pay | Admitting: Nurse Practitioner

## 2021-08-29 NOTE — Telephone Encounter (Signed)
Abnormal lab addressed by provider. ?Requested Prescriptions  ?Pending Prescriptions Disp Refills  ?? gabapentin (NEURONTIN) 300 MG capsule [Pharmacy Med Name: GABAPENTIN '300MG'$  CAPSULES] 180 capsule 3  ?  Sig: TAKE 2 CAPSULES(600 MG) BY MOUTH AT BEDTIME AS NEEDED  ?  ? Neurology: Anticonvulsants - gabapentin Failed - 08/26/2021 10:03 AM  ?  ?  Failed - Cr in normal range and within 360 days  ?  Creatinine, Ser  ?Date Value Ref Range Status  ?08/07/2021 1.02 (H) 0.57 - 1.00 mg/dL Final  ?   ?  ?  Passed - Completed PHQ-2 or PHQ-9 in the last 360 days  ?  ?  Passed - Valid encounter within last 12 months  ?  Recent Outpatient Visits   ?      ? 1 week ago Acute pain of right shoulder  ? [place], NP  ? 3 weeks ago Annual physical exam  ? [place], NP  ? 1 month ago Acute pain of right shoulder  ? [place], NP  ? 3 months ago Gastroesophageal reflux disease, unspecified whether esophagitis present  ? [place], NP  ? 8 months ago Nausea  ? [facility] Jon Billings, NP  ?  ?  ?Future Appointments   ?        ? In 2 months Jon Billings, NP [facility]  ?  ? ?  ?  ?  ? ?

## 2021-09-15 ENCOUNTER — Telehealth: Payer: Self-pay | Admitting: Nurse Practitioner

## 2021-09-15 NOTE — Telephone Encounter (Signed)
The form is asking for Cardiology clearance.  Please have patient make an appointment with Cardiologist to get clearance and once I receive that I will complete the form.

## 2021-09-15 NOTE — Telephone Encounter (Signed)
Patients caregiver brought in a  dental form that needs to be completed by the provider and faxed back. Form was placed in the provider folder for completion.

## 2021-09-15 NOTE — Telephone Encounter (Signed)
Per Santiago Glad, patient would have to reach out to Cardiologist provider to have them complete the requested paperwork. Patient verbalized that she has an upcoming appointment with them Cardiology. Patient verbalized understanding and has no further questions at this time.

## 2021-09-24 ENCOUNTER — Other Ambulatory Visit: Payer: Self-pay | Admitting: Nurse Practitioner

## 2021-09-25 NOTE — Telephone Encounter (Signed)
Requested Prescriptions  Pending Prescriptions Disp Refills  . pantoprazole (PROTONIX) 40 MG tablet [Pharmacy Med Name: PANTOPRAZOLE 40MG TABLETS] 90 tablet 1    Sig: TAKE 1 TABLET(40 MG) BY MOUTH DAILY     Gastroenterology: Proton Pump Inhibitors Passed - 09/24/2021  8:22 AM      Passed - Valid encounter within last 12 months    Recent Outpatient Visits          1 month ago Acute pain of right shoulder   [place], NP   1 month ago Annual physical exam   [place], NP   2 months ago Acute pain of right shoulder   [place], NP   4 months ago Gastroesophageal reflux disease, unspecified whether esophagitis present   [place], NP   9 months ago Nausea   [facility] Jon Billings, NP      Future Appointments            In 1 month Jon Billings, NP [facility], [place]           . ELIQUIS 5 MG TABS tablet [Pharmacy Med Name: ELIQUIS 5MG TABLETS] 180 tablet 0    Sig: TAKE 1 TABLET BY MOUTH TWICE DAILY     Hematology:  Anticoagulants - apixaban Failed - 09/24/2021  8:22 AM      Failed - Cr in normal range and within 360 days    Creatinine, Ser  Date Value Ref Range Status  08/07/2021 1.02 (H) 0.57 - 1.00 mg/dL Final         Passed - PLT in normal range and within 360 days    Platelets  Date Value Ref Range Status  08/07/2021 161 150 - 450 x10E3/uL Final         Passed - HGB in normal range and within 360 days    Hemoglobin  Date Value Ref Range Status  08/07/2021 11.8 11.1 - 15.9 g/dL Final         Passed - HCT in normal range and within 360 days    Hematocrit  Date Value Ref Range Status  08/07/2021 36.0 34.0 - 46.6 % Final         Passed - AST in normal range and within 360 days    AST  Date Value Ref Range Status  08/07/2021 25 0 - 40 IU/L Final         Passed - ALT in normal range and within 360  days    ALT  Date Value Ref Range Status  08/07/2021 21 0 - 32 IU/L Final         Passed - Valid encounter within last 12 months    Recent Outpatient Visits          1 month ago Acute pain of right shoulder   [facility] Jon Billings, NP   1 month ago Annual physical exam   [facility] Jon Billings, NP   2 months ago Acute pain of right shoulder   [facility] Jon Billings, NP   4 months ago Gastroesophageal reflux disease, unspecified whether esophagitis present   [facility] Jon Billings, NP   9 months ago Nausea   [facility] Jon Billings, NP      Future Appointments            In 1 month Jon Billings, NP [facility], [place]           .  ezetimibe (ZETIA) 10 MG tablet [Pharmacy Med Name: EZETIMIBE 10MG TABLETS] 90 tablet 1    Sig: TAKE 1 TABLET(10 MG) BY MOUTH DAILY     Cardiovascular:  Antilipid - Sterol Transport Inhibitors Failed - 09/24/2021  8:22 AM      Failed - Lipid Panel in normal range within the last 12 months    Cholesterol, Total  Date Value Ref Range Status  08/07/2021 151 100 - 199 mg/dL Final   Cholesterol Piccolo, Waived  Date Value Ref Range Status  09/05/2016 154 <200 mg/dL Final    Comment:                            Desirable                <200                         Borderline High      200- 239                         High                     >239    LDL Chol Calc (NIH)  Date Value Ref Range Status  08/07/2021 61 0 - 99 mg/dL Final   HDL  Date Value Ref Range Status  08/07/2021 77 >39 mg/dL Final   Triglycerides  Date Value Ref Range Status  08/07/2021 63 0 - 149 mg/dL Final   Triglycerides Piccolo,Waived  Date Value Ref Range Status  09/05/2016 46 <150 mg/dL Final    Comment:                            Normal                   <150                         Borderline High     150 - 199                         High                 200 - 499                         Very High                >499          Passed - AST in normal range and within 360 days    AST  Date Value Ref Range Status  08/07/2021 25 0 - 40 IU/L Final         Passed - ALT in normal range and within 360 days    ALT  Date Value Ref Range Status  08/07/2021 21 0 - 32 IU/L Final         Passed - Patient is not pregnant      Passed - Valid encounter within last 12 months    Recent Outpatient Visits          1 month ago Acute pain of right shoulder   [place], NP   1 month ago Annual physical exam  [place], NP   2 months ago Acute pain of right shoulder   [place], NP   4 months ago Gastroesophageal reflux disease, unspecified whether esophagitis present   [place], NP   9 months ago Nausea   [facility] Jon Billings, NP      Future Appointments            In 1 month Jon Billings, NP [facility], [place]           . olmesartan-hydrochlorothiazide (BENICAR HCT) 20-12.5 MG tablet [Pharmacy Med Name: OLMESARTAN MEDOX/HCTZ 20-12.5MG TAB] 90 tablet 1    Sig: TAKE 1 TABLET BY MOUTH DAILY     Cardiovascular: ARB + Diuretic Combos Failed - 09/24/2021  8:22 AM      Failed - Cr in normal range and within 180 days    Creatinine, Ser  Date Value Ref Range Status  08/07/2021 1.02 (H) 0.57 - 1.00 mg/dL Final         Passed - K in normal range and within 180 days    Potassium  Date Value Ref Range Status  08/07/2021 4.0 3.5 - 5.2 mmol/L Final         Passed - Na in normal range and within 180 days    Sodium  Date Value Ref Range Status  08/07/2021 144 134 - 144 mmol/L Final         Passed - eGFR is 10 or above and within 180 days    GFR calc Af Amer  Date Value Ref Range Status  01/28/2020 53 (L) >59 mL/min/1.73 Final    Comment:    **In accordance with  recommendations from the NKF-ASN Task force,**   [facility] is in the process of updating its eGFR calculation to the   2021 CKD-EPI creatinine equation that estimates kidney function   without a race variable.    GFR calc non Af Amer  Date Value Ref Range Status  01/28/2020 46 (L) >59 mL/min/1.73 Final   eGFR  Date Value Ref Range Status  08/07/2021 57 (L) >59 mL/min/1.73 Final         Passed - Patient is not pregnant      Passed - Last BP in normal range    BP Readings from Last 1 Encounters:  08/22/21 138/82         Passed - Valid encounter within last 6 months    Recent Outpatient Visits          1 month ago Acute pain of right shoulder   [facility] Jon Billings, NP   1 month ago Annual physical exam   [facility] Jon Billings, NP   2 months ago Acute pain of right shoulder   [facility] Jon Billings, NP   4 months ago Gastroesophageal reflux disease, unspecified whether esophagitis present   [facility] Jon Billings, NP   9 months ago Nausea   [facility] Jon Billings, NP      Future Appointments            In 1 month Jon Billings, NP [facility] [place]

## 2021-11-03 NOTE — Progress Notes (Deleted)
There were no vitals taken for this visit.   Subjective:    Patient ID: Michelle Jenkins, female    DOB: 1946/02/04, 76 y.o.   MRN: [id number]  HPI: Michelle Jenkins is a 76 y.o. female  No chief complaint on file.  HYPERTENSION / HYPERLIPIDEMIA Satisfied with current treatment? {Blank single:19197::"yes","no"} Duration of hypertension: {Blank single:19197::"chronic","months","years"} BP monitoring frequency: {Blank single:19197::"not checking","rarely","daily","weekly","monthly","a few times a day","a few times a week","a few times a month"} BP range:  BP medication side effects: {Blank single:19197::"yes","no"} Past BP meds: {Blank DHRCBULA:45364::"WOEH","OZYYQMGNOI","BBCWUGQBVQ/XIHWTUUEKC","MKLKJZPH","XTAVWPVXYI","AXKPVVZSMO/LMBE","MLJQGBEEFE (bystolic)","carvedilol","chlorthalidone","clonidine","diltiazem","exforge HCT","HCTZ","irbesartan (avapro)","labetalol","lisinopril","lisinopril-HCTZ","losartan (cozaar)","methyldopa","nifedipine","olmesartan (benicar)","olmesartan-HCTZ","quinapril","ramipril","spironalactone","tekturna","valsartan","valsartan-HCTZ","verapamil"} Duration of hyperlipidemia: {Blank single:19197::"chronic","months","years"} Cholesterol medication side effects: {Blank single:19197::"yes","no"} Cholesterol supplements: {Blank multiple:19196::"none","fish oil","niacin","red yeast rice"} Past cholesterol medications: {Blank multiple:19196::"none","atorvastain (lipitor)","lovastatin (mevacor)","pravastatin (pravachol)","rosuvastatin (crestor)","simvastatin (zocor)","vytorin","fenofibrate (tricor)","gemfibrozil","ezetimide (zetia)","niaspan","lovaza"} Medication compliance: {Blank single:19197::"excellent compliance","good compliance","fair compliance","poor compliance"} Aspirin: {Blank single:19197::"yes","no"} Recent stressors: {Blank single:19197::"yes","no"} Recurrent headaches: {Blank single:19197::"yes","no"} Visual changes: {Blank  single:19197::"yes","no"} Palpitations: {Blank single:19197::"yes","no"} Dyspnea: {Blank single:19197::"yes","no"} Chest pain: {Blank single:19197::"yes","no"} Lower extremity edema: {Blank single:19197::"yes","no"} Dizzy/lightheaded: {Blank single:19197::"yes","no"}  Relevant past medical, surgical, family and social history reviewed and updated as indicated. Interim medical history since our last visit reviewed. Allergies and medications reviewed and updated.  Review of Systems  Per HPI unless specifically indicated above     Objective:    There were no vitals taken for this visit.  Wt Readings from Last 3 Encounters:  08/22/21 (!) 301 lb 9.6 oz (136.8 kg)  08/07/21 297 lb 9.6 oz (135 kg)  07/19/21 (!) 302 lb 6.4 oz (137.2 kg)    Physical Exam  Results for orders placed or performed in visit on 08/07/21  HgB A1c  Result Value Ref Range   Hgb A1c MFr Bld 6.2 (H) 4.8 - 5.6 %   Est. average glucose Bld gHb Est-mCnc 131 mg/dL  CBC with Differential/Platelet  Result Value Ref Range   WBC 6.6 3.4 - 10.8 x10E3/uL   RBC 3.75 (L) 3.77 - 5.28 x10E6/uL   Hemoglobin 11.8 11.1 - 15.9 g/dL   Hematocrit 36.0 34.0 - 46.6 %   MCV 96 79 - 97 fL   MCH 31.5 26.6 - 33.0 pg   MCHC 32.8 31.5 - 35.7 g/dL   RDW 13.2 11.7 - 15.4 %   Platelets 161 150 - 450 x10E3/uL   Neutrophils 53 Not Estab. %   Lymphs 33 Not Estab. %   Monocytes 10 Not Estab. %   Eos 3 Not Estab. %   Basos 1 Not Estab. %   Neutrophils Absolute 3.5 1.4 - 7.0 x10E3/uL   Lymphocytes Absolute 2.2 0.7 - 3.1 x10E3/uL   Monocytes Absolute 0.7 0.1 - 0.9 x10E3/uL   EOS (ABSOLUTE) 0.2 0.0 - 0.4 x10E3/uL   Basophils Absolute 0.0 0.0 - 0.2 x10E3/uL   Immature Granulocytes 0 Not Estab. %   Immature Grans (Abs) 0.0 0.0 - 0.1 x10E3/uL  Comprehensive metabolic panel  Result Value Ref Range   Glucose 106 (H) 70 - 99 mg/dL   BUN 25 8 - 27 mg/dL   Creatinine, Ser 1.02 (H) 0.57 - 1.00 mg/dL   eGFR 57 (L) >59 mL/min/1.73    BUN/Creatinine Ratio 25 12 - 28   Sodium 144 134 - 144 mmol/L   Potassium 4.0 3.5 - 5.2 mmol/L   Chloride 104 96 - 106 mmol/L   CO2 25 20 - 29 mmol/L   Calcium 9.3 8.7 - 10.3 mg/dL   Total Protein 6.4 6.0 - 8.5 g/dL   Albumin 4.2 3.7 - 4.7 g/dL   Globulin, Total 2.2  1.5 - 4.5 g/dL   Albumin/Globulin Ratio 1.9 1.2 - 2.2   Bilirubin Total 0.6 0.0 - 1.2 mg/dL   Alkaline Phosphatase 66 44 - 121 IU/L   AST 25 0 - 40 IU/L   ALT 21 0 - 32 IU/L  Lipid panel  Result Value Ref Range   Cholesterol, Total 151 100 - 199 mg/dL   Triglycerides 63 0 - 149 mg/dL   HDL 77 >39 mg/dL   VLDL Cholesterol Cal 13 5 - 40 mg/dL   LDL Chol Calc (NIH) 61 0 - 99 mg/dL   Chol/HDL Ratio 2.0 0.0 - 4.4 ratio  TSH  Result Value Ref Range   TSH 3.180 0.450 - 4.500 uIU/mL  Urinalysis, Routine w reflex microscopic  Result Value Ref Range   Specific Gravity, UA 1.020 1.005 - 1.030   pH, UA 5.5 5.0 - 7.5   Color, UA Yellow Yellow   Appearance Ur Cloudy (A) Clear   Leukocytes,UA Negative Negative   Protein,UA Negative Negative/Trace   Glucose, UA Negative Negative   Ketones, UA Negative Negative   RBC, UA Negative Negative   Bilirubin, UA Negative Negative   Urobilinogen, Ur 0.2 0.2 - 1.0 mg/dL   Nitrite, UA Negative Negative      Assessment & Plan:   Problem List Items Addressed This Visit       Cardiovascular and Mediastinum   Primary pulmonary hypertension ([place])   Hypertension   Pulmonary artery hypertension (HCC) - Primary   Atrial fibrillation (HCC)   CHF (congestive heart failure) (HCC)     Endocrine   IFG (impaired fasting glucose)     Other   Hypercholesteremia     Follow up plan: No follow-ups on file.

## 2021-11-06 ENCOUNTER — Ambulatory Visit: Payer: Medicare Other | Admitting: Nurse Practitioner

## 2021-11-06 DIAGNOSIS — I4891 Unspecified atrial fibrillation: Secondary | ICD-10-CM

## 2021-11-06 DIAGNOSIS — I270 Primary pulmonary hypertension: Secondary | ICD-10-CM

## 2021-11-06 DIAGNOSIS — I2721 Secondary pulmonary arterial hypertension: Secondary | ICD-10-CM

## 2021-11-06 DIAGNOSIS — I10 Essential (primary) hypertension: Secondary | ICD-10-CM

## 2021-11-06 DIAGNOSIS — I509 Heart failure, unspecified: Secondary | ICD-10-CM

## 2021-11-06 DIAGNOSIS — R7301 Impaired fasting glucose: Secondary | ICD-10-CM

## 2021-11-06 DIAGNOSIS — E7800 Pure hypercholesterolemia, unspecified: Secondary | ICD-10-CM

## 2021-11-20 ENCOUNTER — Other Ambulatory Visit: Payer: Self-pay | Admitting: Nurse Practitioner

## 2021-11-21 NOTE — Telephone Encounter (Signed)
Requested Prescriptions  Pending Prescriptions Disp Refills  . labetalol (NORMODYNE) 200 MG tablet [Pharmacy Med Name: LABETALOL '200MG'$  TABLETS] 180 tablet 0    Sig: TAKE 1 TABLET(200 MG) BY MOUTH TWICE DAILY     Cardiovascular:  Beta Blockers Passed - 11/20/2021  9:20 AM      Passed - Last BP in normal range    BP Readings from Last 1 Encounters:  08/22/21 138/82         Passed - Last Heart Rate in normal range    Pulse Readings from Last 1 Encounters:  08/22/21 64         Passed - Valid encounter within last 6 months    Recent Outpatient Visits          3 months ago Acute pain of right shoulder   [place], NP   3 months ago Annual physical exam   [place], NP   4 months ago Acute pain of right shoulder   [place], NP   6 months ago Gastroesophageal reflux disease, unspecified whether esophagitis present   [place], NP   11 months ago Nausea   [facility] Jon Billings, NP

## 2021-12-24 ENCOUNTER — Other Ambulatory Visit: Payer: Self-pay | Admitting: Nurse Practitioner

## 2021-12-25 ENCOUNTER — Other Ambulatory Visit: Payer: Self-pay | Admitting: Nurse Practitioner

## 2021-12-25 NOTE — Telephone Encounter (Signed)
Medication Refill - Medication: labetalol, gabapetin 300 mg atorvastatin (LIPITOR) 20 MG tablet , '20mg'$  /pantropozol '40mg'$  ezetimibe (ZETIA) 10 MG tablet/ Olmesartan hydrochlorize 20-12.5/ ELIQUIS 5 MG TABS   [employer] Agent requesting a 100 day supply instead of 90 days  Has the patient contacted their pharmacy? yes (Agent: If no, request that the patient contact the pharmacy for the refill. If patient does not wish to contact the pharmacy document the reason why and proceed with request.) (Agent: If yes, when and what did the pharmacy advise?)[employer] agent called directly in  Preferred Pharmacy (with phone number or street name):  [facility] [address] Phone:  [phone]  Fax:  [phone]     Has the patient been seen for an appointment in the last year OR does the patient have an upcoming appointment? yes  Agent: Please be advised that RX refills may take up to 3 business days. We ask that you follow-up with your pharmacy.

## 2021-12-26 NOTE — Telephone Encounter (Signed)
Requested Prescriptions  Pending Prescriptions Disp Refills  . ELIQUIS 5 MG TABS tablet [Pharmacy Med Name: ELIQUIS '5MG'$  TABLETS] 180 tablet 0    Sig: TAKE 1 TABLET BY MOUTH TWICE DAILY     Hematology:  Anticoagulants - apixaban Failed - 12/24/2021  7:04 AM      Failed - Cr in normal range and within 360 days    Creatinine, Ser  Date Value Ref Range Status  08/07/2021 1.02 (H) 0.57 - 1.00 mg/dL Final         Passed - PLT in normal range and within 360 days    Platelets  Date Value Ref Range Status  08/07/2021 161 150 - 450 x10E3/uL Final         Passed - HGB in normal range and within 360 days    Hemoglobin  Date Value Ref Range Status  08/07/2021 11.8 11.1 - 15.9 g/dL Final         Passed - HCT in normal range and within 360 days    Hematocrit  Date Value Ref Range Status  08/07/2021 36.0 34.0 - 46.6 % Final         Passed - AST in normal range and within 360 days    AST  Date Value Ref Range Status  08/07/2021 25 0 - 40 IU/L Final         Passed - ALT in normal range and within 360 days    ALT  Date Value Ref Range Status  08/07/2021 21 0 - 32 IU/L Final         Passed - Valid encounter within last 12 months    Recent Outpatient Visits          4 months ago Acute pain of right shoulder   [facility] Jon Billings, NP   4 months ago Annual physical exam   [facility] Jon Billings, NP   5 months ago Acute pain of right shoulder   [facility] Jon Billings, NP   7 months ago Gastroesophageal reflux disease, unspecified whether esophagitis present   [facility] Jon Billings, NP   1 year ago Nausea   [facility] Jon Billings, NP

## 2021-12-27 MED ORDER — EZETIMIBE 10 MG PO TABS: ORAL_TABLET | ORAL | 0 refills | Status: DC

## 2021-12-27 MED ORDER — PANTOPRAZOLE SODIUM 40 MG PO TBEC: DELAYED_RELEASE_TABLET | ORAL | 0 refills | Status: DC

## 2021-12-27 MED ORDER — ATORVASTATIN CALCIUM 20 MG PO TABS: ORAL_TABLET | ORAL | 0 refills | Status: DC

## 2021-12-27 MED ORDER — OLMESARTAN MEDOXOMIL-HCTZ 20-12.5 MG PO TABS: 1.0000 | ORAL_TABLET | Freq: Every day | ORAL | 0 refills | Status: DC

## 2021-12-27 MED ORDER — APIXABAN 5 MG PO TABS: 5.0000 mg | ORAL_TABLET | Freq: Two times a day (BID) | ORAL | 0 refills | Status: DC

## 2021-12-27 NOTE — Telephone Encounter (Signed)
Marland Kitchen Requested Prescriptions  Pending Prescriptions Disp Refills  . gabapentin (NEURONTIN) 300 MG capsule 180 capsule 3     Neurology: Anticonvulsants - gabapentin Failed - 12/25/2021  3:33 PM      Failed - Cr in normal range and within 360 days    Creatinine, Ser  Date Value Ref Range Status  08/07/2021 1.02 (H) 0.57 - 1.00 mg/dL Final         Passed - Completed PHQ-2 or PHQ-9 in the last 360 days      Passed - Valid encounter within last 12 months    Recent Outpatient Visits          4 months ago Acute pain of right shoulder   [place], NP   4 months ago Annual physical exam   [facility] Jon Billings, NP   5 months ago Acute pain of right shoulder   [facility] Jon Billings, NP   7 months ago Gastroesophageal reflux disease, unspecified whether esophagitis present   [facility] Jon Billings, NP   1 year ago Nausea   [facility] [place], Santiago Glad, NP             . labetalol (NORMODYNE) 200 MG tablet 180 tablet 0     Cardiovascular:  Beta Blockers Passed - 12/25/2021  3:33 PM      Passed - Last BP in normal range    BP Readings from Last 1 Encounters:  08/22/21 138/82         Passed - Last Heart Rate in normal range    Pulse Readings from Last 1 Encounters:  08/22/21 64         Passed - Valid encounter within last 6 months    Recent Outpatient Visits          4 months ago Acute pain of right shoulder   [place], NP   4 months ago Annual physical exam   [facility] Jon Billings, NP   5 months ago Acute pain of right shoulder   [facility] Jon Billings, NP   7 months ago Gastroesophageal reflux disease, unspecified whether esophagitis present   [facility] Jon Billings, NP   1 year ago Nausea   [place], Santiago Glad, NP             . atorvastatin  (LIPITOR) 20 MG tablet 100 tablet 0    Sig: TAKE 1 TABLET(20 MG) BY MOUTH DAILY     Cardiovascular:  Antilipid - Statins Failed - 12/25/2021  3:33 PM      Failed - Lipid Panel in normal range within the last 12 months    Cholesterol, Total  Date Value Ref Range Status  08/07/2021 151 100 - 199 mg/dL Final   Cholesterol Piccolo, Waived  Date Value Ref Range Status  09/05/2016 154 <200 mg/dL Final    Comment:                            Desirable                <200                         Borderline High      200- 239  High                     >239    LDL Chol Calc (NIH)  Date Value Ref Range Status  08/07/2021 61 0 - 99 mg/dL Final   HDL  Date Value Ref Range Status  08/07/2021 77 >39 mg/dL Final   Triglycerides  Date Value Ref Range Status  08/07/2021 63 0 - 149 mg/dL Final   Triglycerides Piccolo,Waived  Date Value Ref Range Status  09/05/2016 46 <150 mg/dL Final    Comment:                            Normal                   <150                         Borderline High     150 - 199                         High                200 - 499                         Very High                >499          Passed - Patient is not pregnant      Passed - Valid encounter within last 12 months    Recent Outpatient Visits          4 months ago Acute pain of right shoulder   [facility] Jon Bill[facility], NP   4 months ago Annual physical exam   [facility] Jon Billings, NP   5 months ago Acute pain of right shoulder   [facility] Jon Billings, NP   7 months ago Gastroesophageal reflux disease, unspecified whether esophagitis present   [facility] Jon Billings, NP   1 year ago Nausea   [place], Santiago Glad, NP             . pantoprazole (PROTONIX) 40 MG tablet 100 tablet 0    Sig: TAKE 1 TABLET(40 MG) BY MOUTH DAILY     Gastroenterology: Proton Pump  Inhibitors Passed - 12/25/2021  3:33 PM      Passed - Valid encounter within last 12 months    Recent Outpatient Visits          4 months ago Acute pain of right shoulder   [facility] Jon Billings, NP   4 months ago Annual physical exam   [facility] Jon Billings, NP   5 months ago Acute pain of right shoulder   [facility] Jon Billings, NP   7 months ago Gastroesophageal reflux disease, unspecified whether esophagitis present   [place], NP   1 year ago Nausea   [place], Santiago Glad, NP             . ezetimibe (ZETIA) 10 MG tablet 100 tablet 0    Sig: TAKE 1 TABLET(10 MG) BY MOUTH DAILY     Cardiovascular:  Antilipid - Sterol Transport Inhibitors Failed - 12/25/2021  3:33 PM  Failed - Lipid Panel in normal range within the last 12 months    Cholesterol, Total  Date Value Ref Range Status  08/07/2021 151 100 - 199 mg/dL Final   Cholesterol Piccolo, [place]  Date Value Ref Range Status  09/05/2016 154 <200 mg/dL Final    Comment:                            Desirable                <200                         Borderline High      200- 239                         High                     >239    LDL Chol Calc (NIH)  Date Value Ref Range Status  08/07/2021 61 0 - 99 mg/dL Final   HDL  Date Value Ref Range Status  08/07/2021 77 >39 mg/dL Final   Triglycerides  Date Value Ref Range Status  08/07/2021 63 0 - 149 mg/dL Final   Triglycerides Piccolo,Waived  Date Value Ref Range Status  09/05/2016 46 <150 mg/dL Final    Comment:                            Normal                   <150                         Borderline High     150 - 199                         High                200 - 499                         Very High                >499          Passed - AST in normal range and within 360 days    AST  Date Value Ref Range Status  08/07/2021  25 0 - 40 IU/L Final         Passed - ALT in normal range and within 360 days    ALT  Date Value Ref Range Status  08/07/2021 21 0 - 32 IU/L Final         Passed - Patient is not pregnant      Passed - Valid encounter within last 12 months    Recent Outpatient Visits          4 months ago Acute pain of right shoulder   [facility] Jon Billings, NP   4 months ago Annual physical exam   [facility] Jon Billings, NP   5 months ago Acute pain of right shoulder   [facility] Jon Billings, NP   7 months ago Gastroesophageal reflux disease, unspecified whether esophagitis present   [facility] Jon Billings, NP  1 year ago Nausea   [facility] Jon Bill[facility], NP             . apixaban (ELIQUIS) 5 MG TABS tablet 100 tablet 0    Sig: Take 1 tablet (5 mg total) by mouth 2 (two) times daily.     Hematology:  Anticoagulants - apixaban Failed - 12/25/2021  3:33 PM      Failed - Cr in normal range and within 360 days    Creatinine, Ser  Date Value Ref Range Status  08/07/2021 1.02 (H) 0.57 - 1.00 mg/dL Final         Passed - PLT in normal range and within 360 days    Platelets  Date Value Ref Range Status  08/07/2021 161 150 - 450 x10E3/uL Final         Passed - HGB in normal range and within 360 days    Hemoglobin  Date Value Ref Range Status  08/07/2021 11.8 11.1 - 15.9 g/dL Final         Passed - HCT in normal range and within 360 days    Hematocrit  Date Value Ref Range Status  08/07/2021 36.0 34.0 - 46.6 % Final         Passed - AST in normal range and within 360 days    AST  Date Value Ref Range Status  08/07/2021 25 0 - 40 IU/L Final         Passed - ALT in normal range and within 360 days    ALT  Date Value Ref Range Status  08/07/2021 21 0 - 32 IU/L Final         Passed - Valid encounter within last 12 months    Recent Outpatient Visits          4 months ago  Acute pain of right shoulder   [facility] Jon Billings, NP   4 months ago Annual physical exam   [facility] Jon Bill[facility], NP   5 months ago Acute pain of right shoulder   [facility], NP   7 months ago Gastroesophageal reflux disease, unspecified whether esophagitis present   [facility] Jon Billings, NP   1 year ago Nausea   [place], Santiago Glad, NP             . olmesartan-hydrochlorothiazide (BENICAR HCT) 20-12.5 MG tablet 100 tablet 0    Sig: Take 1 tablet by mouth daily.     Cardiovascular: ARB + Diuretic Combos Failed - 12/25/2021  3:33 PM      Failed - Cr in normal range and within 180 days    Creatinine, Ser  Date Value Ref Range Status  08/07/2021 1.02 (H) 0.57 - 1.00 mg/dL Final         Passed - K in normal range and within 180 days    Potassium  Date Value Ref Range Status  08/07/2021 4.0 3.5 - 5.2 mmol/L Final         Passed - Na in normal range and within 180 days    Sodium  Date Value Ref Range Status  08/07/2021 144 134 - 144 mmol/L Final         Passed - eGFR is 10 or above and within 180 days    GFR calc Af Amer  Date Value Ref Range Status  01/28/2020 53 (L) >59 mL/min/1.73 Final    Comment:    **In accordance with recommendations  from the NKF-ASN Task force,**   [facility] is in the process of updating its eGFR calculation to the   2021 CKD-EPI creatinine equation that estimates kidney function   without a race variable.    GFR calc non Af Amer  Date Value Ref Range Status  01/28/2020 46 (L) >59 mL/min/1.73 Final   eGFR  Date Value Ref Range Status  08/07/2021 57 (L) >59 mL/min/1.73 Final         Passed - Patient is not pregnant      Passed - Last BP in normal range    BP Readings from Last 1 Encounters:  08/22/21 138/82         Passed - Valid encounter within last 6 months    Recent Outpatient Visits          4 months ago  Acute pain of right shoulder   [facility] Jon Billings, NP   4 months ago Annual physical exam   [facility] Jon Bill[facility], NP   5 months ago Acute pain of right shoulder   [facility] Jon Bill[facility], NP   7 months ago Gastroesophageal reflux disease, unspecified whether esophagitis present   [facility] Jon Billings, NP   1 year ago Nausea   [facility] Jon Billings, NP

## 2021-12-27 NOTE — Telephone Encounter (Signed)
Requested Prescriptions  Pending Prescriptions Disp Refills  . gabapentin (NEURONTIN) 300 MG capsule 180 capsule 3     Neurology: Anticonvulsants - gabapentin Failed - 12/25/2021  3:33 PM      Failed - Cr in normal range and within 360 days    Creatinine, Ser  Date Value Ref Range Status  08/07/2021 1.02 (H) 0.57 - 1.00 mg/dL Final         Passed - Completed PHQ-2 or PHQ-9 in the last 360 days      Passed - Valid encounter within last 12 months    Recent Outpatient Visits          4 months ago Acute pain of right shoulder   [place], NP   4 months ago Annual physical exam   [facility] Jon Billings, NP   5 months ago Acute pain of right shoulder   [facility] Jon Billings, NP   7 months ago Gastroesophageal reflux disease, unspecified whether esophagitis present   [facility] Jon Billings, NP   1 year ago Nausea   [facility] [place], Santiago Glad, NP             . labetalol (NORMODYNE) 200 MG tablet 180 tablet 0     Cardiovascular:  Beta Blockers Passed - 12/25/2021  3:33 PM      Passed - Last BP in normal range    BP Readings from Last 1 Encounters:  08/22/21 138/82         Passed - Last Heart Rate in normal range    Pulse Readings from Last 1 Encounters:  08/22/21 64         Passed - Valid encounter within last 6 months    Recent Outpatient Visits          4 months ago Acute pain of right shoulder   [place], NP   4 months ago Annual physical exam   [facility] Jon Billings, NP   5 months ago Acute pain of right shoulder   [facility] Jon Billings, NP   7 months ago Gastroesophageal reflux disease, unspecified whether esophagitis present   [facility] Jon Billings, NP   1 year ago Nausea   [place], NP             Signed Prescriptions  Disp Refills   atorvastatin (LIPITOR) 20 MG tablet 100 tablet 0    Sig: TAKE 1 TABLET(20 MG) BY MOUTH DAILY     Cardiovascular:  Antilipid - Statins Failed - 12/25/2021  3:33 PM      Failed - Lipid Panel in normal range within the last 12 months    Cholesterol, Total  Date Value Ref Range Status  08/07/2021 151 100 - 199 mg/dL Final   Cholesterol Piccolo, Waived  Date Value Ref Range Status  09/05/2016 154 <200 mg/dL Final    Comment:                            Desirable                <200                         Borderline High      200- 239  High                     >239    LDL Chol Calc (NIH)  Date Value Ref Range Status  08/07/2021 61 0 - 99 mg/dL Final   HDL  Date Value Ref Range Status  08/07/2021 77 >39 mg/dL Final   Triglycerides  Date Value Ref Range Status  08/07/2021 63 0 - 149 mg/dL Final   Triglycerides Piccolo,Waived  Date Value Ref Range Status  09/05/2016 46 <150 mg/dL Final    Comment:                            Normal                   <150                         Borderline High     150 - 199                         High                200 - 499                         Very High                >499          Passed - Patient is not pregnant      Passed - Valid encounter within last 12 months    Recent Outpatient Visits          4 months ago Acute pain of right shoulder   [facility] Jon Billings, NP   4 months ago Annual physical exam   [facility] Jon Billings, NP   5 months ago Acute pain of right shoulder   [facility] Jon Bill[facility], NP   7 months ago Gastroesophageal reflux disease, unspecified whether esophagitis present   [facility] Jon Billings, NP   1 year ago Nausea   [place], Santiago Glad, NP              pantoprazole (PROTONIX) 40 MG tablet 100 tablet 0    Sig: TAKE 1 TABLET(40 MG) BY MOUTH DAILY      Gastroenterology: Proton Pump Inhibitors Passed - 12/25/2021  3:33 PM      Passed - Valid encounter within last 12 months    Recent Outpatient Visits          4 months ago Acute pain of right shoulder   [facility] Jon Billings, NP   4 months ago Annual physical exam   [facility] Jon Billings, NP   5 months ago Acute pain of right shoulder   [facility] Jon Bill[facility], NP   7 months ago Gastroesophageal reflux disease, unspecified whether esophagitis present   [facility] Jon Billings, NP   1 year ago Nausea   [place], Santiago Glad, NP              ezetimibe (ZETIA) 10 MG tablet 100 tablet 0    Sig: TAKE 1 TABLET(10 MG) BY MOUTH DAILY     Cardiovascular:  Antilipid - Sterol Transport Inhibitors Failed - 12/25/2021  3:33 PM  Failed - Lipid Panel in normal range within the last 12 months    Cholesterol, Total  Date Value Ref Range Status  08/07/2021 151 100 - 199 mg/dL Final   Cholesterol Piccolo, [place]  Date Value Ref Range Status  09/05/2016 154 <200 mg/dL Final    Comment:                            Desirable                <200                         Borderline High      200- 239                         High                     >239    LDL Chol Calc (NIH)  Date Value Ref Range Status  08/07/2021 61 0 - 99 mg/dL Final   HDL  Date Value Ref Range Status  08/07/2021 77 >39 mg/dL Final   Triglycerides  Date Value Ref Range Status  08/07/2021 63 0 - 149 mg/dL Final   Triglycerides Piccolo,Waived  Date Value Ref Range Status  09/05/2016 46 <150 mg/dL Final    Comment:                            Normal                   <150                         Borderline High     150 - 199                         High                200 - 499                         Very High                >499          Passed - AST in normal range and within 360 days    AST  Date Value  Ref Range Status  08/07/2021 25 0 - 40 IU/L Final         Passed - ALT in normal range and within 360 days    ALT  Date Value Ref Range Status  08/07/2021 21 0 - 32 IU/L Final         Passed - Patient is not pregnant      Passed - Valid encounter within last 12 months    Recent Outpatient Visits          4 months ago Acute pain of right shoulder   [facility] Jon Billings, NP   4 months ago Annual physical exam   [facility] Jon Billings, NP   5 months ago Acute pain of right shoulder   [facility] Jon Billings, NP   7 months ago Gastroesophageal reflux disease, unspecified whether esophagitis present   [facility] Jon Billings, NP  1 year ago Nausea   [place], NP              apixaban (ELIQUIS) 5 MG TABS tablet 100 tablet 0    Sig: Take 1 tablet (5 mg total) by mouth 2 (two) times daily.     Hematology:  Anticoagulants - apixaban Failed - 12/25/2021  3:33 PM      Failed - Cr in normal range and within 360 days    Creatinine, Ser  Date Value Ref Range Status  08/07/2021 1.02 (H) 0.57 - 1.00 mg/dL Final         Passed - PLT in normal range and within 360 days    Platelets  Date Value Ref Range Status  08/07/2021 161 150 - 450 x10E3/uL Final         Passed - HGB in normal range and within 360 days    Hemoglobin  Date Value Ref Range Status  08/07/2021 11.8 11.1 - 15.9 g/dL Final         Passed - HCT in normal range and within 360 days    Hematocrit  Date Value Ref Range Status  08/07/2021 36.0 34.0 - 46.6 % Final         Passed - AST in normal range and within 360 days    AST  Date Value Ref Range Status  08/07/2021 25 0 - 40 IU/L Final         Passed - ALT in normal range and within 360 days    ALT  Date Value Ref Range Status  08/07/2021 21 0 - 32 IU/L Final         Passed - Valid encounter within last 12 months    Recent Outpatient Visits           4 months ago Acute pain of right shoulder   [facility] Jon Billings, NP   4 months ago Annual physical exam   [facility] Jon Billings, NP   5 months ago Acute pain of right shoulder   [facility] Jon Billings, NP   7 months ago Gastroesophageal reflux disease, unspecified whether esophagitis present   [facility] Jon Billings, NP   1 year ago Nausea   [place], Santiago Glad, NP              olmesartan-hydrochlorothiazide (BENICAR HCT) 20-12.5 MG tablet 100 tablet 0    Sig: Take 1 tablet by mouth daily.     Cardiovascular: ARB + Diuretic Combos Failed - 12/25/2021  3:33 PM      Failed - Cr in normal range and within 180 days    Creatinine, Ser  Date Value Ref Range Status  08/07/2021 1.02 (H) 0.57 - 1.00 mg/dL Final         Passed - K in normal range and within 180 days    Potassium  Date Value Ref Range Status  08/07/2021 4.0 3.5 - 5.2 mmol/L Final         Passed - Na in normal range and within 180 days    Sodium  Date Value Ref Range Status  08/07/2021 144 134 - 144 mmol/L Final         Passed - eGFR is 10 or above and within 180 days    GFR calc Af Amer  Date Value Ref Range Status  01/28/2020 53 (L) >59 mL/min/1.73 Final    Comment:    **In accordance with recommendations  from the NKF-ASN Task force,**   [facility] is in the process of updating its eGFR calculation to the   2021 CKD-EPI creatinine equation that estimates kidney function   without a race variable.    GFR calc non Af Amer  Date Value Ref Range Status  01/28/2020 46 (L) >59 mL/min/1.73 Final   eGFR  Date Value Ref Range Status  08/07/2021 57 (L) >59 mL/min/1.73 Final         Passed - Patient is not pregnant      Passed - Last BP in normal range    BP Readings from Last 1 Encounters:  08/22/21 138/82         Passed - Valid encounter within last 6 months    Recent Outpatient Visits           4 months ago Acute pain of right shoulder   [facility] Jon Billings, NP   4 months ago Annual physical exam   [facility] Jon Billings, NP   5 months ago Acute pain of right shoulder   [facility] Jon Billings, NP   7 months ago Gastroesophageal reflux disease, unspecified whether esophagitis present   [facility] Jon Billings, NP   1 year ago Nausea   [facility] Jon Billings, NP

## 2022-01-22 ENCOUNTER — Other Ambulatory Visit: Payer: Self-pay | Admitting: Nurse Practitioner

## 2022-01-23 NOTE — Telephone Encounter (Signed)
Requested Prescriptions  Pending Prescriptions Disp Refills  . atorvastatin (LIPITOR) 20 MG tablet [Pharmacy Med Name: ATORVASTATIN '20MG'$  TABLETS] 90 tablet     Sig: TAKE 1 TABLET(20 MG) BY MOUTH DAILY     Cardiovascular:  Antilipid - Statins Failed - 01/22/2022  4:48 PM      Failed - Lipid Panel in normal range within the last 12 months    Cholesterol, Total  Date Value Ref Range Status  08/07/2021 151 100 - 199 mg/dL Final   Cholesterol Piccolo, Waived  Date Value Ref Range Status  09/05/2016 154 <200 mg/dL Final    Comment:                            Desirable                <200                         Borderline High      200- 239                         High                     >239    LDL Chol Calc (NIH)  Date Value Ref Range Status  08/07/2021 61 0 - 99 mg/dL Final   HDL  Date Value Ref Range Status  08/07/2021 77 >39 mg/dL Final   Triglycerides  Date Value Ref Range Status  08/07/2021 63 0 - 149 mg/dL Final   Triglycerides Piccolo,Waived  Date Value Ref Range Status  09/05/2016 46 <150 mg/dL Final    Comment:                            Normal                   <150                         Borderline High     150 - 199                         High                200 - 499                         Very High                >499          Passed - Patient is not pregnant      Passed - Valid encounter within last 12 months    Recent Outpatient Visits          5 months ago Acute pain of right shoulder   [place], NP   5 months ago Annual physical exam   [facility] Jon Billings, NP   6 months ago Acute pain of right shoulder   [facility] Jon Billings, NP   8 months ago Gastroesophageal reflux disease, unspecified whether esophagitis present   [facility] Jon Billings, NP   1 year ago Nausea   [facility] Jon Billings, NP

## 2022-01-30 ENCOUNTER — Encounter (HOSPITAL_BASED_OUTPATIENT_CLINIC_OR_DEPARTMENT_OTHER): Payer: Self-pay

## 2022-01-30 DIAGNOSIS — G4733 Obstructive sleep apnea (adult) (pediatric): Secondary | ICD-10-CM

## 2022-02-07 ENCOUNTER — Other Ambulatory Visit: Payer: Self-pay | Admitting: Nurse Practitioner

## 2022-02-07 DIAGNOSIS — Z1231 Encounter for screening mammogram for malignant neoplasm of breast: Secondary | ICD-10-CM

## 2022-02-21 ENCOUNTER — Telehealth: Payer: Self-pay | Admitting: Nurse Practitioner

## 2022-02-21 NOTE — Telephone Encounter (Signed)
N/A unable to leave a message for patient to call back and schedule the Medicare Annual Wellness Visit (AWV) virtually or by telephone.  Last AWV 02/22/21  Please schedule at anytime with [facility]Nurse Health Advisor.    Any questions, please call me at [phone]

## 2022-02-28 ENCOUNTER — Telehealth: Payer: Self-pay | Admitting: Nurse Practitioner

## 2022-02-28 MED ORDER — LABETALOL HCL 200 MG PO TABS: ORAL_TABLET | ORAL | 0 refills | Status: DC

## 2022-02-28 NOTE — Telephone Encounter (Signed)
What other pharmacy can patient go to?

## 2022-02-28 NOTE — Progress Notes (Unsigned)
There were no vitals taken for this visit.   Subjective:    Patient ID: Michelle Jenkins, female    DOB: 27-Aug-1945, 76 y.o.   MRN: [id number]  HPI: Michelle Jenkins is a 76 y.o. female presenting on 03/01/2022 for comprehensive medical examination. Current medical complaints include:{Blank single:19197::"none","***"}  She currently lives with: Menopausal Symptoms: {Blank single:19197::"yes","no"}  Functional Status Survey:       08/07/2021   10:31 AM 07/19/2021   10:03 AM 02/22/2021   11:08 AM 02/19/2020    1:04 PM 12/10/2019    1:19 PM  Fall Risk   Falls in the past year? 0 0 0 0 0  Number falls in past yr: 0 0 0  0  Injury with Fall? 0 0 0  0  Risk for fall due to : No Fall Risks No Fall Risks  Medication side effect;Impaired mobility   Follow up Falls evaluation completed Falls evaluation completed Falls evaluation completed;Falls prevention discussed Falls evaluation completed;Education provided;Falls prevention discussed     Depression Screen    02/22/2021   11:01 AM 02/19/2020    1:05 PM 02/12/2019   10:23 AM 01/16/2018    9:53 AM 12/12/2017   10:49 AM  Depression screen PHQ 2/9  Decreased Interest 0 0 0 2 0  Down, Depressed, Hopeless 0 0 0 2 0  PHQ - 2 Score 0 0 0 4 0  Altered sleeping    1 2  Tired, decreased energy    1 2  Change in appetite    3 3  Feeling bad or failure about yourself     1 3  Trouble concentrating    0 0  Moving slowly or fidgety/restless    0 0  Suicidal thoughts    0 0  PHQ-9 Score    10 10  Difficult doing work/chores    Not difficult at all      Advanced Directives Does patient have a HCPOA?    {Blank single:19197::"yes","no"} If yes, name and contact information:  Does patient have a living will or MOST form?  {Blank single:19197::"yes","no"}  Past Medical History:  Past Medical History:  Diagnosis Date   Anginal pain ([place])    Asthma    Bronchitis    CAD (coronary artery disease)    CHF (congestive heart failure) (HCC)    CVA  (cerebral infarction)    Dysrhythmia    GERD (gastroesophageal reflux disease)    History of chronic atrial fibrillation    Hypercholesteremia    Hypertension    Idiopathic pulmonary hypertension (H[facility])    Myocardial infarction ([place])    Obesity    OSA (obstructive sleep apnea)    Pre-diabetes    Pulmonary artery hypertension (HCC)    Shortness of breath dyspnea    Sleep apnea    Stroke [facility])     Surgical History:  Past Surgical History:  Procedure Laterality Date   BREAST BIOPSY Right 11/15/2015   stereo, COMPLEX SCLEROSING LESION WITH INTRADUCTAL PAPILLOMA COMPONENT AND    BREAST EXCISIONAL BIOPSY Right 02/27/2016   NEG   BREAST LUMPECTOMY WITH NEEDLE LOCALIZATION Right 02/27/2016   Procedure: BREAST LUMPECTOMY WITH NEEDLE LOCALIZATION;  Surgeon: Christene Lye, MD;  Location: [facility] ORS;  Service: General;  Laterality: Right;   CARDIAC CATHETERIZATION     CARDIAC CATHETERIZATION Right 12/07/2014   Procedure: Right and Left Heart Cath with Coronary Angiography;  Surgeon: Teodoro Spray, MD;  Location: [place] [facility] LAB;  Service: Cardiovascular;  Laterality: Right;   CARPAL TUNNEL RELEASE  1998  and 1999   CATARACT EXTRACTION W/PHACO Right 09/24/2019   Procedure: CATARACT EXTRACTION PHACO AND INTRAOCULAR LENS PLACEMENT ([place]) RIGHT;  Surgeon: Birder Robson, MD;  Location: [facility] ORS;  Service: Ophthalmology;  Laterality: Right;  cde00:44.8 us6.96 [id number] h   COLONOSCOPY  10/31/1998   DILATION AND CURETTAGE OF UTERUS     FOOT SURGERY Right    HYSTEROSCOPY     JOINT REPLACEMENT Left    KNEE ARTHROSCOPY AND ARTHROTOMY Right    lt knee replacement     TONSILLECTOMY     UVULOPALATOPHARYNGOPLASTY      Medications:  Current Outpatient Medications on File Prior to Visit  Medication Sig   ambrisentan (LETAIRIS) 10 MG tablet Take 10 mg by mouth daily.   apixaban (ELIQUIS) 5 MG TABS tablet Take 1 tablet (5 mg total) by mouth 2 (two) times daily.   atorvastatin  (LIPITOR) 20 MG tablet TAKE 1 TABLET(20 MG) BY MOUTH DAILY   ELIQUIS 5 MG TABS tablet TAKE 1 TABLET BY MOUTH TWICE DAILY   ezetimibe (ZETIA) 10 MG tablet TAKE 1 TABLET(10 MG) BY MOUTH DAILY   furosemide (LASIX) 20 MG tablet Take 1 tablet (20 mg total) by mouth 2 (two) times daily. TAKE 1 TABLET(20 MG) BY MOUTH TWICE DAILY AS NEEDED   gabapentin (NEURONTIN) 300 MG capsule TAKE 2 CAPSULES(600 MG) BY MOUTH AT BEDTIME AS NEEDED   labetalol (NORMODYNE) 200 MG tablet TAKE 1 TABLET(200 MG) BY MOUTH TWICE DAILY   olmesartan-hydrochlorothiazide (BENICAR HCT) 20-12.5 MG tablet Take 1 tablet by mouth daily.   Omega-3 Fatty Acids (FISH OIL PO) Take 1 Dose by mouth daily.   ondansetron (ZOFRAN ODT) 4 MG disintegrating tablet Take 1 tablet (4 mg total) by mouth every 8 (eight) hours as needed for nausea or vomiting.   pantoprazole (PROTONIX) 40 MG tablet TAKE 1 TABLET(40 MG) BY MOUTH DAILY   potassium chloride (KLOR-CON) 10 MEQ tablet TAKE 1 TABLET(10 MEQ) BY MOUTH EVERY DAY   Psyllium (METAMUCIL FIBER PO) Take 1 Dose by mouth. Patient uses Metamucil Powder in the AM   VENTOLIN HFA 108 (90 Base) MCG/ACT inhaler    XYLIMELTS 550 MG DISK DISSOLVE 1 TABLET ON THE TONGUE EVERY 8 HOURS AS NEEDED FOR DRY MOUTH.   No current facility-administered medications on file prior to visit.    Allergies:  Allergies  Allergen Reactions   Ace Inhibitors Cough   Nyquil Multi-Symptom [Pseudoeph-Doxylamine-Dm-Apap] Itching   Tylenol [Acetaminophen] Itching    Social History:  Social History   Socioeconomic History   Marital status: Single    Spouse name: Not on file   Number of children: Not on file   Years of education: Not on file   Highest education level: 8th grade  Occupational History   Occupation: retired  Tobacco Use   Smoking status: Former    Packs/day: 0.25    Types: Cigarettes    Quit date: 06/15/2014    Years since quitting: 7.7   Smokeless tobacco: Never   Tobacco comments:    4-5 cigarettes a  day when she smoked  Vaping Use   Vaping Use: Never used  Substance and Sexual Activity   Alcohol use: Yes    Alcohol/week: 0.0 - 1.0 standard drinks of alcohol    Comment: occasionally   Drug use: No   Sexual activity: Not Currently  Other Topics Concern   Not on file  Social History Narrative   Not on file  Social Determinants of Health   Financial Resource Strain: Low Risk  (02/22/2021)   Overall Financial Resource Strain (CARDIA)    Difficulty of Paying Living Expenses: Not very hard  Food Insecurity: No Food Insecurity (02/22/2021)   Hunger Vital Sign    Worried About Running Out of Food in the Last Year: Never true    Ran Out of Food in the Last Year: Never true  Transportation Needs: No Transportation Needs (02/22/2021)   PRAPARE - Hydrologist (Medical): No    Lack of Transportation (Non-Medical): No  Physical Activity: Inactive (02/22/2021)   Exercise Vital Sign    Days of Exercise per Week: 0 days    Minutes of Exercise per Session: 0 min  Stress: No Stress Concern Present (02/22/2021)   [place]    Feeling of Stress : Not at all  Social Connections: Unknown (02/22/2021)   Social Connection and Isolation Panel [NHANES]    Frequency of Communication with Friends and Family: Three times a week    Frequency of Social Gatherings with Friends and Family: Three times a week    Attends Religious Services: 1 to 4 times per year    Active Member of Clubs or Organizations: No    Attends Archivist Meetings: Never    Marital Status: Not on file  Intimate Partner Violence: Not At Risk (02/22/2021)   Humiliation, Afraid, Rape, and Kick questionnaire    Fear of Current or Ex-Partner: No    Emotionally Abused: No    Physically Abused: No    Sexually Abused: No   Social History   Tobacco Use  Smoking Status Former   Packs/day: 0.25   Types: Cigarettes   Quit date:  06/15/2014   Years since quitting: 7.7  Smokeless Tobacco Never  Tobacco Comments   4-5 cigarettes a day when she smoked   Social History   Substance and Sexual Activity  Alcohol Use Yes   Alcohol/week: 0.0 - 1.0 standard drinks of alcohol   Comment: occasionally    Family History:  Family History  Problem Relation Age of Onset   Diabetes Mother    Alzheimer's disease Mother    Throat cancer Brother    Diabetes Brother    Cancer Brother        lung   Hypertension Brother    Cancer Daughter 91       metastatic uterine PECOMA to the liver and lungs   Rheumatic fever Daughter    Diabetes Sister    Hypertension Sister    COPD Neg Hx    Heart disease Neg Hx    Stroke Neg Hx    Breast cancer Neg Hx     Past medical history, surgical history, medications, allergies, family history and social history reviewed with patient today and changes made to appropriate areas of the chart.   ROS  All other ROS negative except what is listed above and in the HPI.      Objective:    There were no vitals taken for this visit.  Wt Readings from Last 3 Encounters:  08/22/21 (!) 301 lb 9.6 oz (136.8 kg)  08/07/21 297 lb 9.6 oz (135 kg)  07/19/21 (!) 302 lb 6.4 oz (137.2 kg)    No results found.  Physical Exam     02/19/2020    1:10 PM 01/16/2018    9:56 AM 12/13/2016   10:36 AM  6CIT Screen  What Year? 0 points 0 points 0 points  What month? 0 points 0 points 0 points  What time? 0 points 0 points 0 points  Count back from 20 0 points 0 points 0 points  Months in reverse 2 points 0 points 0 points  Repeat phrase 8 points 2 points 4 points  Total Score 10 points 2 points 4 points    Cognitive Testing - 6-CIT  Correct? Score   What year is it? {YES NO:22349} {Numbers; 0-4:31231} Yes = 0    No = 4  What month is it? {YES NO:22349} {Numbers; 0-4:31231} Yes = 0    No = 3  Remember:     Pia [address]au, [place], [place]     What time is it? {YES NO:22349} {Numbers; 0-4:31231}  Yes = 0    No = 3  Count backwards from 20 to 1 {YES NO:22349} {Numbers; 0-4:31231} Correct = 0    1 error = 2   More than 1 error = 4  Say the months of the year in reverse. {YES NO:22349} {Numbers; 0-4:31231} Correct = 0    1 error = 2   More than 1 error = 4  What address did I ask you to remember? {YES NO:22349} {NUMBERS; 0-10:5044} Correct = 0  1 error = 2    2 error = 4    3 error = 6    4 error = 8    All wrong = 10       TOTAL SCORE  {Numbers; 1-60:10932}/35   Interpretation:  {Desc; normal/abnormal:11317::"Normal"}  Normal (0-7) Abnormal (8-28)   Results for orders placed or performed in visit on 08/07/21  HgB A1c  Result Value Ref Range   Hgb A1c MFr Bld 6.2 (H) 4.8 - 5.6 %   Est. average glucose Bld gHb Est-mCnc 131 mg/dL  CBC with Differential/Platelet  Result Value Ref Range   WBC 6.6 3.4 - 10.8 x10E3/uL   RBC 3.75 (L) 3.77 - 5.28 x10E6/uL   Hemoglobin 11.8 11.1 - 15.9 g/dL   Hematocrit 36.0 34.0 - 46.6 %   MCV 96 79 - 97 fL   MCH 31.5 26.6 - 33.0 pg   MCHC 32.8 31.5 - 35.7 g/dL   RDW 13.2 11.7 - 15.4 %   Platelets 161 150 - 450 x10E3/uL   Neutrophils 53 Not Estab. %   Lymphs 33 Not Estab. %   Monocytes 10 Not Estab. %   Eos 3 Not Estab. %   Basos 1 Not Estab. %   Neutrophils Absolute 3.5 1.4 - 7.0 x10E3/uL   Lymphocytes Absolute 2.2 0.7 - 3.1 x10E3/uL   Monocytes Absolute 0.7 0.1 - 0.9 x10E3/uL   EOS (ABSOLUTE) 0.2 0.0 - 0.4 x10E3/uL   Basophils Absolute 0.0 0.0 - 0.2 x10E3/uL   Immature Granulocytes 0 Not Estab. %   Immature Grans (Abs) 0.0 0.0 - 0.1 x10E3/uL  Comprehensive metabolic panel  Result Value Ref Range   Glucose 106 (H) 70 - 99 mg/dL   BUN 25 8 - 27 mg/dL   Creatinine, Ser 1.02 (H) 0.57 - 1.00 mg/dL   eGFR 57 (L) >59 mL/min/1.73   BUN/Creatinine Ratio 25 12 - 28   Sodium 144 134 - 144 mmol/L   Potassium 4.0 3.5 - 5.2 mmol/L   Chloride 104 96 - 106 mmol/L   CO2 25 20 - 29 mmol/L   Calcium 9.3 8.7 - 10.3 mg/dL   Total Protein 6.4 6.0 - 8.5  g/dL   Albumin 4.2 3.7 - 4.7 g/dL   Globulin, Total 2.2 1.5 - 4.5 g/dL   Albumin/Globulin Ratio 1.9 1.2 - 2.2   Bilirubin Total 0.6 0.0 - 1.2 mg/dL   Alkaline Phosphatase 66 44 - 121 IU/L   AST 25 0 - 40 IU/L   ALT 21 0 - 32 IU/L  Lipid panel  Result Value Ref Range   Cholesterol, Total 151 100 - 199 mg/dL   Triglycerides 63 0 - 149 mg/dL   HDL 77 >39 mg/dL   VLDL Cholesterol Cal 13 5 - 40 mg/dL   LDL Chol Calc (NIH) 61 0 - 99 mg/dL   Chol/HDL Ratio 2.0 0.0 - 4.4 ratio  TSH  Result Value Ref Range   TSH 3.180 0.450 - 4.500 uIU/mL  Urinalysis, Routine w reflex microscopic  Result Value Ref Range   Specific Gravity, UA 1.020 1.005 - 1.030   pH, UA 5.5 5.0 - 7.5   Color, UA Yellow Yellow   Appearance Ur Cloudy (A) Clear   Leukocytes,UA Negative Negative   Protein,UA Negative Negative/Trace   Glucose, UA Negative Negative   Ketones, UA Negative Negative   RBC, UA Negative Negative   Bilirubin, UA Negative Negative   Urobilinogen, Ur 0.2 0.2 - 1.0 mg/dL   Nitrite, UA Negative Negative      Assessment & Plan:   Problem List Items Addressed This Visit       Cardiovascular and Mediastinum   Hypertension   Pulmonary artery hypertension (HCC)   Atrial fibrillation (HCC)   CHF (congestive heart failure) (HCC)     Endocrine   IFG (impaired fasting glucose)     Other   Hypercholesteremia   Other Visit Diagnoses     Encounter for Medicare annual wellness exam    -  Primary   Advanced care planning/counseling discussion            Preventative Services:  AAA screening:  Health Risk Assessment and Personalized Prevention Plan: Bone Mass Measurements: Breast Cancer Screening: CVD Screening:  Cervical Cancer Screening: Colon Cancer Screening:  Depression Screening:  Diabetes Screening:  Glaucoma Screening:  Hepatitis B vaccine: Hepatitis C screening:  HIV Screening: Flu Vaccine: Lung cancer Screening: Obesity Screening:  Pneumonia Vaccines (2): STI  Screening:  Follow up plan: No follow-ups on file.   LABORATORY TESTING:  - Pap smear: {Blank KMQKMM:38177::"NHA done","not applicable","up to date","done elsewhere"}  IMMUNIZATIONS:   - Tdap: Tetanus vaccination status reviewed: {tetanus status:315746}. - Influenza: {Blank single:19197::"Up to date","Administered today","Postponed to flu season","Refused","Given elsewhere"} - Pneumovax: {Blank single:19197::"Up to date","Administered today","Not applicable","Refused","Given elsewhere"} - Prevnar: {Blank single:19197::"Up to date","Administered today","Not applicable","Refused","Given elsewhere"} - Zostavax vaccine: {Blank single:19197::"Up to date","Administered today","Not applicable","Refused","Given elsewhere"}  SCREENING: -Mammogram: {Blank single:19197::"Up to date","Ordered today","Not applicable","Refused","Done elsewhere"}  - Colonoscopy: {Blank single:19197::"Up to date","Ordered today","Not applicable","Refused","Done elsewhere"}  - Bone Density: {Blank single:19197::"Up to date","Ordered today","Not applicable","Refused","Done elsewhere"}  -Hearing Test: {Blank single:19197::"Up to date","Ordered today","Not applicable","Refused","Done elsewhere"}  -Spirometry: {Blank single:19197::"Up to date","Ordered today","Not applicable","Refused","Done elsewhere"}   PATIENT COUNSELING:   Advised to take 1 mg of folate supplement per day if capable of pregnancy.   Sexuality: Discussed sexually transmitted diseases, partner selection, use of condoms, avoidance of unintended pregnancy  and contraceptive alternatives.   Advised to avoid cigarette smoking.  I discussed with the patient that most people either abstain from alcohol or drink within safe limits (<=14/week and <=4 drinks/occasion for males, <=7/weeks and <= 3 drinks/occasion for females) and that the risk for alcohol disorders and  other health effects rises proportionally with the number of drinks per week and how often a  drinker exceeds daily limits.  Discussed cessation/primary prevention of drug use and availability of treatment for abuse.   Diet: Encouraged to adjust caloric intake to maintain  or achieve ideal body weight, to reduce intake of dietary saturated fat and total fat, to limit sodium intake by avoiding high sodium foods and not adding table salt, and to maintain adequate dietary potassium and calcium preferably from fresh fruits, vegetables, and low-fat dairy products.    stressed the importance of regular exercise  Injury prevention: Discussed safety belts, safety helmets, smoke detector, smoking near bedding or upholstery.   Dental health: Discussed importance of regular tooth brushing, flossing, and dental visits.    NEXT PREVENTATIVE PHYSICAL DUE IN 1 YEAR. No follow-ups on file.

## 2022-02-28 NOTE — Telephone Encounter (Signed)
Pt called to report that her pharmacy does not have her medication in stock.  labetalol (NORMODYNE) 200 MG tablet

## 2022-02-28 NOTE — Telephone Encounter (Signed)
Medication sent to the pharmacy.

## 2022-02-28 NOTE — Telephone Encounter (Signed)
Attempted to call patient to advise her that it looks like her labetalol is most likely out of refills, and to get clarification as to her pharmacy. [place]

## 2022-02-28 NOTE — Telephone Encounter (Signed)
Patient called back, states pharmacy has told her to call PCP since they "have no more" for her. Last fill 11/21/2021 #180 with 0 RF. I have scheduled pt for follow up since she is 6 mo overdue. Scheduled F/Up for 03/01/2022 at 10 AM.

## 2022-02-28 NOTE — Telephone Encounter (Signed)
Pt wants a call back from clinic regarding what to do next

## 2022-02-28 NOTE — Telephone Encounter (Signed)
[facility] please.

## 2022-03-01 ENCOUNTER — Ambulatory Visit (INDEPENDENT_AMBULATORY_CARE_PROVIDER_SITE_OTHER): Payer: Medicare Other | Admitting: Nurse Practitioner

## 2022-03-01 ENCOUNTER — Encounter: Payer: Self-pay | Admitting: Nurse Practitioner

## 2022-03-01 VITALS — BP 136/70 | HR 59 | Temp 98.6°F | Wt 293.9 lb

## 2022-03-01 DIAGNOSIS — I10 Essential (primary) hypertension: Secondary | ICD-10-CM

## 2022-03-01 DIAGNOSIS — I2721 Secondary pulmonary arterial hypertension: Secondary | ICD-10-CM

## 2022-03-01 DIAGNOSIS — Z7189 Other specified counseling: Secondary | ICD-10-CM

## 2022-03-01 DIAGNOSIS — R7301 Impaired fasting glucose: Secondary | ICD-10-CM | POA: Diagnosis not present

## 2022-03-01 DIAGNOSIS — Z0000 Encounter for general adult medical examination without abnormal findings: Secondary | ICD-10-CM

## 2022-03-01 DIAGNOSIS — I509 Heart failure, unspecified: Secondary | ICD-10-CM | POA: Diagnosis not present

## 2022-03-01 DIAGNOSIS — I4891 Unspecified atrial fibrillation: Secondary | ICD-10-CM | POA: Diagnosis not present

## 2022-03-01 DIAGNOSIS — M25511 Pain in right shoulder: Secondary | ICD-10-CM

## 2022-03-01 DIAGNOSIS — G8929 Other chronic pain: Secondary | ICD-10-CM

## 2022-03-01 DIAGNOSIS — F17210 Nicotine dependence, cigarettes, uncomplicated: Secondary | ICD-10-CM

## 2022-03-01 DIAGNOSIS — E7800 Pure hypercholesterolemia, unspecified: Secondary | ICD-10-CM

## 2022-03-01 MED ORDER — PANTOPRAZOLE SODIUM 40 MG PO TBEC: DELAYED_RELEASE_TABLET | ORAL | 1 refills | Status: DC

## 2022-03-01 MED ORDER — FUROSEMIDE 20 MG PO TABS: 20.0000 mg | ORAL_TABLET | Freq: Two times a day (BID) | ORAL | 1 refills | Status: DC

## 2022-03-01 MED ORDER — APIXABAN 5 MG PO TABS: 5.0000 mg | ORAL_TABLET | Freq: Two times a day (BID) | ORAL | 1 refills | Status: DC

## 2022-03-01 MED ORDER — OLMESARTAN MEDOXOMIL-HCTZ 20-12.5 MG PO TABS: 1.0000 | ORAL_TABLET | Freq: Every day | ORAL | 1 refills | Status: DC

## 2022-03-01 MED ORDER — EZETIMIBE 10 MG PO TABS: ORAL_TABLET | ORAL | 1 refills | Status: DC

## 2022-03-01 NOTE — Assessment & Plan Note (Signed)
A voluntary discussion about advance care planning including the explanation and discussion of advance directives was extensively discussed  with the patient for 10 minutes with patient.  Explanation about the health care proxy and Living will was reviewed and packet with forms with explanation of how to fill them out was given.  During this discussion, the patient was able to identify a health care proxy as her daughter and does not plan to fill out the paperwork required.  Patient was offered a separate [address] visit for further assistance with forms.

## 2022-03-01 NOTE — Assessment & Plan Note (Signed)
Chronic.  Controlled.  Continue with current medication regimen of Olmesartan, HCTZ, Labetolol, and Lasix.  Refill sent.  Labs ordered today.  Return to clinic in 3 months for reevaluation.  Call sooner if concerns arise.

## 2022-03-01 NOTE — Assessment & Plan Note (Signed)
Chronic. Stable at 3L.  Seeing pulmonary and Cardiology.  Continue to collaborate with Specialists.

## 2022-03-01 NOTE — Assessment & Plan Note (Signed)
Labs ordered at visit today.  Will make recommendations based on lab results.   

## 2022-03-01 NOTE — Assessment & Plan Note (Signed)
Chronic.  Controlled.  Continue with current medication regimen.  Continue to collaborate with Cardiology.  Labs ordered today.  Return to clinic in 3 months for reevaluation.  Call sooner if concerns arise.

## 2022-03-01 NOTE — Assessment & Plan Note (Signed)
Chronic.  Controlled.  Continue with current medication regimen.  Continue with Eliquis.  Continue to follow up with Cardiology.  Labs ordered today.  Return to clinic in 3 months for reevaluation.  Call sooner if concerns arise.

## 2022-03-01 NOTE — Assessment & Plan Note (Signed)
Chronic.  Controlled.  Continue with current medication regimen of Atorvastatin '20mg'$  daily.  Labs ordered today.  Return to clinic in 6 months for reevaluation.  Call sooner if concerns arise.

## 2022-03-02 ENCOUNTER — Telehealth: Payer: Self-pay

## 2022-03-02 LAB — COMPREHENSIVE METABOLIC PANEL WITH GFR
ALT: 10 IU/L (ref 0–32)
AST: 18 IU/L (ref 0–40)
Albumin/Globulin Ratio: 1.7 (ref 1.2–2.2)
Albumin: 4.2 g/dL (ref 3.8–4.8)
Alkaline Phosphatase: 68 IU/L (ref 44–121)
BUN/Creatinine Ratio: 19 (ref 12–28)
BUN: 20 mg/dL (ref 8–27)
Bilirubin Total: 0.7 mg/dL (ref 0.0–1.2)
CO2: 30 mmol/L — ABNORMAL HIGH (ref 20–29)
Calcium: 9.3 mg/dL (ref 8.7–10.3)
Chloride: 98 mmol/L (ref 96–106)
Creatinine, Ser: 1.07 mg/dL — ABNORMAL HIGH (ref 0.57–1.00)
Globulin, Total: 2.5 g/dL (ref 1.5–4.5)
Glucose: 87 mg/dL (ref 70–99)
Potassium: 3.6 mmol/L (ref 3.5–5.2)
Sodium: 143 mmol/L (ref 134–144)
Total Protein: 6.7 g/dL (ref 6.0–8.5)
eGFR: 53 mL/min/1.73 — ABNORMAL LOW

## 2022-03-02 LAB — LIPID PANEL
Chol/HDL Ratio: 1.9 ratio (ref 0.0–4.4)
Cholesterol, Total: 156 mg/dL (ref 100–199)
HDL: 81 mg/dL (ref >39)
LDL Chol Calc (NIH): 64 mg/dL (ref 0–99)
Triglycerides: 50 mg/dL (ref 0–149)
VLDL Cholesterol Cal: 11 mg/dL (ref 5–40)

## 2022-03-02 LAB — HEMOGLOBIN A1C
Est. average glucose Bld gHb Est-mCnc: 134 mg/dL
Hgb A1c MFr Bld: 6.3 % — ABNORMAL HIGH (ref 4.8–5.6)

## 2022-03-02 NOTE — Progress Notes (Signed)
Please let patient know that her lab work looks good.  Kidney function remains stable.  A1c is in the prediabetic range but it is getting closer to the diabetic range.  I recommend following a low carb diet.

## 2022-03-02 NOTE — Telephone Encounter (Signed)
Patient called and asked what her A1C is on her latest labs, advised 6.3. She asked about the glucose, advised her average on A1C labs is 134. She asked if something on a Low Carb diet could be mailed to her home address on file, advised I will send this to someone who will be able to mail out the information.

## 2022-03-02 NOTE — Telephone Encounter (Signed)
LOW CARB DIET INFORMATION MAILED OUT TO ADDRESS ON FILE.

## 2022-03-13 ENCOUNTER — Ambulatory Visit
Admission: RE | Admit: 2022-03-13 | Discharge: 2022-03-13 | Disposition: A | Discharge status: Home / Self Care | Payer: Medicare Other | Source: Ambulatory Visit | Attending: Nurse Practitioner | Admitting: Nurse Practitioner

## 2022-03-13 DIAGNOSIS — Z1231 Encounter for screening mammogram for malignant neoplasm of breast: Secondary | ICD-10-CM | POA: Insufficient documentation

## 2022-03-14 ENCOUNTER — Other Ambulatory Visit: Payer: Self-pay | Admitting: Nurse Practitioner

## 2022-03-14 DIAGNOSIS — N6489 Other specified disorders of breast: Secondary | ICD-10-CM

## 2022-03-14 DIAGNOSIS — R928 Other abnormal and inconclusive findings on diagnostic imaging of breast: Secondary | ICD-10-CM

## 2022-03-14 NOTE — Progress Notes (Signed)
Please let patient know that her mammogram was abnormal.  There was asymmetry of her right breast.  The radiologist would like to do a diagnostic mammogram.  I have ordered this, please make sure she calls to get this scheduled.

## 2022-03-19 ENCOUNTER — Ambulatory Visit: Payer: Self-pay | Admitting: *Deleted

## 2022-03-19 NOTE — Telephone Encounter (Signed)
  Chief Complaint: medication assistance Symptoms: shoulder pain and pinched nerve Frequency:  Pertinent Negatives: NA Disposition: '[]'$ ED /'[]'$ Urgent Care (no appt availability in office) / '[]'$ Appointment(In office/virtual)/ '[]'$  [facility]/ '[x]'$ Home Care/ '[]'$ Refused Recommended Disposition /'[]'$ [facility]/ '[]'$  Follow-up with PCP Additional Notes: pt advised of interactions below and advised to monitor drowsiness and breathing while taking medication and if noticing any changes to call PCP. Pt verbalized understanding. Pt states rx is for '500mg'$  2 tabs QID prn.      Summary: concerns about new Rx    The patient shares that the name of the medication they would like to check on is Methocarbamol 500 MG  Please contact further when possible  ----- Message from [facility] sent at 03/19/2022 11:17 AM EST ----- Pt stated she was given muscle relaxer and she would like to discuss if it will interfere with her current medications. Pt requests return call. Cb# [phone]           Per [facility]: All current medications entered into listing for interactions: Gabapentin and methocarbamol:   Concurrent use of GABAPENTIN and CNS DEPRESSANTS may result in respiratory depression.    *Only interaction listed with methocarbamol     Reason for Disposition  Caller has medicine question only, adult not sick, AND triager answers question  Answer Assessment - Initial Assessment Questions 1. NAME of MEDICINE: "What medicine(s) are you calling about?"     Methocarbamol  2. QUESTION: "What is your question?" (e.g., double dose of medicine, side effect)     Wanting to see if any interactions between medcations 3. PRESCRIBER: "Who prescribed the medicine?" Reason: if prescribed by specialist, call should be referred to that group.     Ortho provider  4. SYMPTOMS: "Do you have any symptoms?" If Yes, ask: "What symptoms are you having?"  "How bad are the symptoms (e.g., mild, moderate,  severe)     Shoulder pain and pinched nerve  Protocols used: Medication Question Call-A-AH

## 2022-03-19 NOTE — Telephone Encounter (Signed)
Summary: concerns about new Rx   The patient shares that the name of the medication they would like to check on is Methocarbamol 500 MG  Please contact further when possible  ----- Message from [facility] sent at 03/19/2022 11:17 AM EST ----- Pt stated she was given muscle relaxer and she would like to discuss if it will interfere with her current medications. Pt requests return call. Cb# [phone]         Attempted to call patient- no answer- message- call can not be completed at this time message   FYI: Per [facility]: All current medications entered into listing for interactions: Gabapentin and methocarbamol:   Concurrent use of GABAPENTIN and CNS DEPRESSANTS may result in respiratory depression.   *Only interaction listed with methocarbamol

## 2022-03-19 NOTE — Telephone Encounter (Signed)
Second attempt to contact patient regarding her medication question- left message to call office

## 2022-03-23 ENCOUNTER — Ambulatory Visit: Payer: Self-pay

## 2022-03-23 NOTE — Telephone Encounter (Signed)
Message from Roslynn Amble sent at 03/23/2022 11:00 AM EST  Summary: Possible infection   The patient called in stating she has been smelling a weird smell for about 5 weeks now. She says it smells like an infection somewhere but is not sure where it is coming from. She has smelled something like this in the past and was found to have an infection somewhere. She says there looked like a little blood when she dabbed around the vaginal area. Please assist patient further         Chief Complaint: foul odor Symptoms: small amount of dk red blood when she wipes. Frequency: 3 weeks (discharge last week) Pertinent Negatives: Patient denies abdominal pain, fever, itching, pain with urination, injury or foreign body to vaginal area Disposition: '[]'$ ED /'[]'$ Urgent Care (no appt availability in office) / '[x]'$ Appointment(In office/virtual)/ '[]'$  [place] [facility]/ '[]'$ Home Care/ '[]'$ Refused Recommended Disposition /'[]'$ [place] [facility]/ '[]'$  Follow-up with PCP Additional Notes:   Reason for Disposition  Bad smelling vaginal discharge  Answer Assessment - Initial Assessment Questions 1. DISCHARGE: "Describe the discharge." (e.g., white, yellow, green, gray, foamy, cottage cheese-like)     Dark red  2. ODOR: "Is there a bad odor?"     yes 3. ONSET: "When did the discharge begin?"     Last week  4. RASH: "Is there a rash in the genital area?" If Yes, ask: "Describe it." (e.g., redness, blisters, sores, bumps)     no 5. ABDOMEN PAIN: "Are you having any abdomen pain?" If Yes, ask: "What does it feel like? " (e.g., crampy, dull, intermittent, constant)     no 6. ABDOMEN PAIN SEVERITY: If present, ask: "How bad is it?" (e.g., Scale 1-10; mild, moderate, or severe)   - MILD (1-3): Doesn't interfere with normal activities, abdomen soft and not tender to touch.    - MODERATE (4-7): Interferes with normal activities or awakens from sleep, abdomen tender to touch.    - SEVERE (8-10): Excruciating pain,  doubled over, unable to do any normal activities. (R/O peritonitis)      No pain 7. CAUSE: "What do you think is causing the discharge?" "Have you had the same problem before? What happened then?"     unsure 8. OTHER SYMPTOMS: "Do you have any other symptoms?" (e.g., fever, itching, vaginal bleeding, pain with urination, injury to genital area, vaginal foreign body)     Smell, dk red vaginal bleeding when wipes 9. PREGNANCY: "Is there any chance you are pregnant?" "When was your last menstrual period?"     N/a  Protocols used: Vaginal Discharge-A-AH

## 2022-03-27 ENCOUNTER — Encounter: Payer: Self-pay | Admitting: Unknown Physician Specialty

## 2022-03-27 ENCOUNTER — Ambulatory Visit (INDEPENDENT_AMBULATORY_CARE_PROVIDER_SITE_OTHER): Payer: Medicare Other | Admitting: Unknown Physician Specialty

## 2022-03-27 VITALS — BP 105/60 | HR 64 | Temp 99.1°F | Wt 288.1 lb

## 2022-03-27 DIAGNOSIS — N898 Other specified noninflammatory disorders of vagina: Secondary | ICD-10-CM | POA: Diagnosis not present

## 2022-03-27 DIAGNOSIS — I2721 Secondary pulmonary arterial hypertension: Secondary | ICD-10-CM

## 2022-03-27 DIAGNOSIS — N760 Acute vaginitis: Secondary | ICD-10-CM

## 2022-03-27 DIAGNOSIS — B9689 Other specified bacterial agents as the cause of diseases classified elsewhere: Secondary | ICD-10-CM

## 2022-03-27 MED ORDER — METRONIDAZOLE 1 % EX GEL: Freq: Every day | CUTANEOUS | 0 refills | Status: DC

## 2022-03-27 NOTE — Assessment & Plan Note (Signed)
Stable.  Handicapped application signed

## 2022-03-27 NOTE — Progress Notes (Signed)
 BP 105/60   Pulse 64   Temp 99.1 F (37.3 C) (Oral)   Wt 288 lb 1.6 oz (130.7 kg)   SpO2 96%   BMI 47.94 kg/m    Subjective:    Patient ID: Michelle Jenkins, female    DOB: 02/06/1946, 76 y.o.   MRN: [id number]  HPI: Michelle Jenkins is a 76 y.o. female  Chief Complaint  Patient presents with   Vaginal Discharge   Vaginal Bleeding   Vaginal Odor    Patient says she is notices a foul odor and says when she wipes she notices what looks like infection. Patient says in the past she was seeing a GYN specialist and prescribed a vaginal cream to insert into the vaginal area.    Pt states she has noticed an odor but no discharge.  States she "went up there" and noticed brown stained discharge.  No itching. States she had some cream given at night once before which cleared it up.  No pain or recent sexual history  Handicapped placard - Unable to walk more than 200 feet.  On continuous O2.  Needs her placard signed.    Relevant past medical, surgical, family and social history reviewed and updated as indicated. Interim medical history since our last visit reviewed. Allergies and medications reviewed and updated.  Review of Systems  Per HPI unless specifically indicated above     Objective:    BP 105/60   Pulse 64   Temp 99.1 F (37.3 C) (Oral)   Wt 288 lb 1.6 oz (130.7 kg)   SpO2 96%   BMI 47.94 kg/m   Wt Readings from Last 3 Encounters:  03/27/22 288 lb 1.6 oz (130.7 kg)  03/01/22 293 lb 14.4 oz (133.3 kg)  08/22/21 (!) 301 lb 9.6 oz (136.8 kg)    Physical Exam Constitutional:      General: She is not in acute distress.    Appearance: Normal appearance. She is well-developed.  HENT:     Head: Normocephalic and atraumatic.  Eyes:     General: Lids are normal. No scleral icterus.       Right eye: No discharge.        Left eye: No discharge.     Conjunctiva/sclera: Conjunctivae normal.  Cardiovascular:     Rate and Rhythm: Normal rate.  Pulmonary:     Effort:  Pulmonary effort is normal.     Comments: On O2 Abdominal:     Palpations: There is no hepatomegaly or splenomegaly.  Genitourinary:    Pubic Area: No rash.      Labia:        Right: No rash.      Urethra: No urethral swelling.     Vagina: Normal. No vaginal discharge.  Musculoskeletal:        General: Normal range of motion.  Skin:    Coloration: Skin is not pale.     Findings: No rash.  Neurological:     Mental Status: She is alert and oriented to person, place, and time.  Psychiatric:        Behavior: Behavior normal.        Thought Content: Thought content normal.        Judgment: Judgment normal.     Results for orders placed or performed in visit on 03/01/22  Comp Met (CMET)  Result Value Ref Range   Glucose 87 70 - 99 mg/dL   BUN 20 8 - 27 mg/dL   Creatinine,   Ser 1.07 (H) 0.57 - 1.00 mg/dL   eGFR 53 (L) >59 mL/min/1.73   BUN/Creatinine Ratio 19 12 - 28   Sodium 143 134 - 144 mmol/L   Potassium 3.6 3.5 - 5.2 mmol/L   Chloride 98 96 - 106 mmol/L   CO2 30 (H) 20 - 29 mmol/L   Calcium 9.3 8.7 - 10.3 mg/dL   Total Protein 6.7 6.0 - 8.5 g/dL   Albumin 4.2 3.8 - 4.8 g/dL   Globulin, Total 2.5 1.5 - 4.5 g/dL   Albumin/Globulin Ratio 1.7 1.2 - 2.2   Bilirubin Total 0.7 0.0 - 1.2 mg/dL   Alkaline Phosphatase 68 44 - 121 IU/L   AST 18 0 - 40 IU/L   ALT 10 0 - 32 IU/L  Lipid Profile  Result Value Ref Range   Cholesterol, Total 156 100 - 199 mg/dL   Triglycerides 50 0 - 149 mg/dL   HDL 81 >39 mg/dL   VLDL Cholesterol Cal 11 5 - 40 mg/dL   LDL Chol Calc (NIH) 64 0 - 99 mg/dL   Chol/HDL Ratio 1.9 0.0 - 4.4 ratio  HgB A1c  Result Value Ref Range   Hgb A1c MFr Bld 6.3 (H) 4.8 - 5.6 %   Est. average glucose Bld gHb Est-mCnc 134 mg/dL      Assessment & Plan:   Problem List Items Addressed This Visit       Unprioritized   Pulmonary artery hypertension ([place])    Stable.  Handicapped application signed        Other Visit Diagnoses     Vaginal odor    -  Primary    no real discharge or bleeding with further discussion.  symptoms consistent with BV.  Rx for Metronidazole.  RTC if no improvement   Bacterial vaginosis       Rx for Metrondazole to use QHS for 5 days.        Follow up plan: Return if symptoms worsen or fail to improve.

## 2022-03-28 ENCOUNTER — Other Ambulatory Visit: Payer: Self-pay | Admitting: Unknown Physician Specialty

## 2022-03-28 ENCOUNTER — Other Ambulatory Visit: Payer: Medicare Other

## 2022-03-28 ENCOUNTER — Encounter (HOSPITAL_BASED_OUTPATIENT_CLINIC_OR_DEPARTMENT_OTHER): Payer: Self-pay

## 2022-03-28 ENCOUNTER — Ambulatory Visit: Payer: Self-pay | Admitting: *Deleted

## 2022-03-28 DIAGNOSIS — G4733 Obstructive sleep apnea (adult) (pediatric): Secondary | ICD-10-CM

## 2022-03-28 MED ORDER — METRONIDAZOLE 0.75 % VA GEL: 1.0000 | Freq: Every day | VAGINAL | 0 refills | Status: DC

## 2022-03-28 NOTE — Telephone Encounter (Signed)
Called patient and advise that new prescription was sent to pharmacy.  Patient acknowledged understanding.

## 2022-03-28 NOTE — Telephone Encounter (Signed)
Summary: Pt feels the wrong Rx was sent in to her pharmacy   Pt stated a Rx for metroNIDAZOLE (METROGEL) 1 % gel was sent to her pharmacy. Pt stated nothing is wrong with her face so she does not know why this Rx was prescribed to her. Pt requests call back to discuss Rx as she feels the wrong Rx may have been sent in. Cb# [phone]          Chief Complaint: Medication problem Symptoms: NA Frequency: NA Pertinent Negatives: Patient denies NA Disposition: '[]'$ ED /'[]'$ Urgent Care (no appt availability in office) / '[]'$ Appointment(In office/virtual)/ '[]'$  [place] [facility]/ '[]'$ Home Care/ '[]'$ Refused Recommended Disposition /'[]'$ [facility]/ '[x]'$  Follow-up with PCP Additional Notes: Pt seen yesterday, BV.  Pt states picked up med yesterday, "Had to call them they forgot the applicator." States pharmacy told her it was the wrong medication for vaginal use and to call PCP. metroNIDAZOLE (METROGEL) 1 % gel  Apply topically daily. Dispense: 45 g, Refills: 0 ordered    Please advise. Reason for Disposition  [1] Caller has URGENT medicine question about med that PCP or specialist prescribed AND [2] triager unable to answer question  Answer Assessment - Initial Assessment Questions 1. NAME of MEDICINE: "What medicine(s) are you calling about?"     Metronidazole gel 2. QUESTION: "What is your question?" (e.g., double dose of medicine, side effect)     Wrong medicine? 3. PRESCRIBER: "Who prescribed the medicine?" Reason: if prescribed by specialist, call should be referred to that group.     Katheren Puller  Protocols used: Medication Question Call-A-AH

## 2022-03-30 ENCOUNTER — Ambulatory Visit: Payer: Self-pay | Admitting: *Deleted

## 2022-03-30 NOTE — Telephone Encounter (Signed)
Summary: she blows her nose there is a lot of mucus    Pt called saying she has burning in her eye and nose.  She said when she blows her nose there is a lot of mucus.. It started yesterday.  I tried to schedule an appt but there was not anything until the 20th. Asking for med to be sent to pharmacy. [facility] [place], [place] AT [facility] [address] Phone: [phone] Fax: [phone]     Attempted to call patient regarding her symptoms- left message to call office.

## 2022-03-30 NOTE — Telephone Encounter (Signed)
Please call and schedule the patient an appointment per Dr. Wynetta Emery.

## 2022-03-30 NOTE — Telephone Encounter (Signed)
Pt scheduled 12/18

## 2022-03-30 NOTE — Telephone Encounter (Signed)
Reason for Disposition  [1] SEVERE pain AND [2] not improved 2 hours after pain medicine  Answer Assessment - Initial Assessment Questions 1. LOCATION: "Where does it hurt?"      Right side of my nose is burning and it makes my right eye burn.   It's really running.    It's a lot of mucus from my nose started yesterday evening. 2. ONSET: "When did the sinus pain start?"  (e.g., hours, days)      Yesterday evening.   The mucus started.    3. SEVERITY: "How bad is the pain?"   (Scale 1-10; mild, moderate or severe)   - MILD (1-3): doesn't interfere with normal activities    - MODERATE (4-7): interferes with normal activities (e.g., work or school) or awakens from sleep   - SEVERE (8-10): excruciating pain and patient unable to do any normal activities        Right nose burning.   Right eye watery but no pus.    4. RECURRENT SYMPTOM: "Have you ever had sinus problems before?" If Yes, ask: "When was the last time?" and "What happened that time?"      No 5. NASAL CONGESTION: "Is the nose blocked?" If Yes, ask: "Can you open it or must you breathe through your mouth?"     It's real runny.    I've haven't taken any decongestant.     6. NASAL DISCHARGE: "Do you have discharge from your nose?" If so ask, "What color?"     Yes a lot of mucus. 7. FEVER: "Do you have a fever?" If Yes, ask: "What is it, how was it measured, and when did it start?"      I feel fine except my nose burns and my right eye is running and burning.   I only have my right eye.    8. OTHER SYMPTOMS: "Do you have any other symptoms?" (e.g., sore throat, cough, earache, difficulty breathing)     No coughing or sore throat. 9. PREGNANCY: "Is there any chance you are pregnant?" "When was your last menstrual period?"     N/A  Protocols used: Sinus Pain or Congestion-A-AH  Chief Complaint: Right side of her nose is burning causing her right eye to be watery.   Only has one eye.   Getting a lot of mucus from the right side of my  nose. Symptoms: Burning on right side of nose with a lot of mucus.   Left side is fine.    No other symptoms like coughing, fever, sore throat.   No fever. Frequency: Yesterday started. Pertinent Negatives: Patient denies see above Disposition: '[]'$ ED /'[]'$ Urgent Care (no appt availability in office) / '[]'$ Appointment(In office/virtual)/ '[]'$  [place] [facility]/ '[]'$ Home Care/ '[]'$ Refused Recommended Disposition /'[]'$ [place] [facility]/ '[x]'$  Follow-up with PCP Additional Notes: My does not have transportation to the office and does not use computers.   She has asked if Jon Billings can prescribe or recommend something for her burning nose.   "I don't want infection getting into my right eye.    It's the only one I have. Pt. Agreeable to someone calling her back.

## 2022-03-30 NOTE — Telephone Encounter (Signed)
Unfortunately will need appointment.

## 2022-03-30 NOTE — Telephone Encounter (Signed)
Can anyone advise on this since the patient's PCP is out of office? Any suggestions for the patient?

## 2022-03-31 ENCOUNTER — Emergency Department: Payer: Medicare Other

## 2022-03-31 ENCOUNTER — Other Ambulatory Visit: Payer: Self-pay

## 2022-03-31 ENCOUNTER — Emergency Department
Admission: EM | Admit: 2022-03-31 | Discharge: 2022-03-31 | Disposition: A | Discharge status: Home / Self Care | Payer: Medicare Other | Attending: Emergency Medicine | Admitting: Emergency Medicine

## 2022-03-31 DIAGNOSIS — I10 Essential (primary) hypertension: Secondary | ICD-10-CM | POA: Diagnosis not present

## 2022-03-31 DIAGNOSIS — Z20822 Contact with and (suspected) exposure to covid-19: Secondary | ICD-10-CM | POA: Insufficient documentation

## 2022-03-31 DIAGNOSIS — J449 Chronic obstructive pulmonary disease, unspecified: Secondary | ICD-10-CM | POA: Insufficient documentation

## 2022-03-31 DIAGNOSIS — B309 Viral conjunctivitis, unspecified: Secondary | ICD-10-CM | POA: Insufficient documentation

## 2022-03-31 DIAGNOSIS — J0100 Acute maxillary sinusitis, unspecified: Secondary | ICD-10-CM

## 2022-03-31 LAB — RESP PANEL BY RT-PCR (RSV, FLU A&B, COVID)  RVPGX2
Influenza A by PCR: NEGATIVE
Influenza B by PCR: NEGATIVE
Resp Syncytial Virus by PCR: NEGATIVE
SARS Coronavirus 2 by RT PCR: NEGATIVE

## 2022-03-31 MED ORDER — SULFACETAMIDE SODIUM 10 % OP SOLN: 2.0000 [drp] | Freq: Four times a day (QID) | OPHTHALMIC | 0 refills | Status: AC

## 2022-03-31 MED ORDER — CEPHALEXIN 500 MG PO CAPS: 500.0000 mg | ORAL_CAPSULE | Freq: Three times a day (TID) | ORAL | 0 refills | Status: DC

## 2022-03-31 NOTE — ED Triage Notes (Signed)
Pt to ED for R nose burning and R eye watering since 2 days ago. No cough or fever. States "it all started when I was eating some food, then I coughed, then I had a piece of snot come out my nose, and I haven't been right ever since."  Pt has only 1 eye. Pt wears 3L at baseline.

## 2022-03-31 NOTE — ED Notes (Signed)
Discharge instructions explained to patient and family at this time. Patient and family state they understand and agree. Patient left via wheelchair from ed.

## 2022-03-31 NOTE — ED Provider Notes (Signed)
[facility] Provider Note    Event Date/Time   First MD Initiated Contact with Patient 03/31/22 1701     (approximate)   History   eye watering and nose burning   HPI  Michelle Jenkins is a 76 y.o. female with history of COPD hypertension and hyperlipidemia presents emergency department complaining of her right sided of her nose burning in her right eye watering for 2 days.  No cough or fever.  Patient does wear O2 at home.  She was eating food when she coughed and then had a piece of mucus come out of her nose.  Unsure if she inhaled any food.  She states the eye has been watering and nose has been running since then.  States it saw that is only on the right side of her face.      Physical Exam   Triage Vital Signs: ED Triage Vitals  Enc Vitals Group     BP 03/31/22 1523 128/69     Pulse Rate 03/31/22 1523 70     Resp 03/31/22 1523 20     Temp 03/31/22 1523 98.4 F (36.9 C)     Temp Source 03/31/22 1523 Oral     SpO2 03/31/22 1523 96 %     Weight 03/31/22 1519 286 lb 9.6 oz (130 kg)     Height 03/31/22 1519 '5\' 4"'$  (1.626 m)     Head Circumference --      Peak Flow --      Pain Score 03/31/22 1519 10     Pain Loc --      Pain Edu? --      Excl. in [place]? --     Most recent vital signs: Vitals:   03/31/22 1523  BP: 128/69  Pulse: 70  Resp: 20  Temp: 98.4 F (36.9 C)  SpO2: 96%     General: Awake, no distress.   CV:  Good peripheral perfusion. regular rate and  rhythm Resp:  Normal effort. Lungs CTA Abd:  No distention.   Other:  ENT: Right eyes injected, right side of the nose does not show foreign body or any inflammation, throat is normal, neck is supple   ED Results / Procedures / Treatments   Labs (all labs ordered are listed, but only abnormal results are displayed) Labs Reviewed  RESP PANEL BY RT-PCR (RSV, FLU A&B, COVID)  [id number]     EKG     RADIOLOGY X-ray chest    PROCEDURES:   Procedures   MEDICATIONS  ORDERED IN ED: Medications - No data to display   IMPRESSION / MDM / [place] / ED COURSE  I reviewed the triage vital signs and the nursing notes.                              Differential diagnosis includes, but is not limited to, aspiration pneumonia, acute sinusitis, COVID, flu, RSV  Patient's presentation is most consistent with acute complicated illness / injury requiring diagnostic workup.   Due to the patient's high risk of COPD along with some URI symptoms I feel that a respiratory panel is warranted.  Will also do a chest x-ray to ensure she is not aspirated any food.   Chest x-ray independently reviewed and interpreted by me as being negative  Respiratory  panel is reassuring  I did explain the findings to the patient.  She is placed on amoxicillin and  Bleph-10.  She is to follow-up with her regular doctor.  I do feel this is more of a sinusitis and conjunctivitis.  She is in agreement treatment plan.  She is discharged stable condition.     FINAL CLINICAL IMPRESSION(S) / ED DIAGNOSES   Final diagnoses:  Acute maxillary sinusitis, recurrence not specified  Acute viral conjunctivitis of right eye     Rx / DC Orders   ED Discharge Orders          Ordered    cephALEXin (KEFLEX) 500 MG capsule  3 times daily        03/31/22 1948    sulfacetamide (BLEPH-10) 10 % ophthalmic solution  4 times daily        03/31/22 1948             Note:  This document was prepared using Dragon voice recognition software and may include unintentional dictation errors.    Versie Starks, PA-C 03/31/22 1950    Lucillie Garfinkel, MD 04/01/22 1150

## 2022-04-01 NOTE — Patient Instructions (Incomplete)
Photophobia Photophobia is an eye condition that means your eyes are very sensitive to light. You may feel like your eyes hurt after exposure to sunlight or light from light bulbs. Other symptoms include: Squinting or covering your eyes, even in dim light. Taking a long time for your eyes to adjust from low light to bright light. This condition can happen in one eye or both eyes. It may be caused by eye inflammation from infections or other disorders, migraines, or neurological problems such as tumors. Your health care provider will do an eye exam and a physical exam. Other testing may include blood tests or imaging tests such as a CT scan or an MRI to find the cause. Follow these instructions at home: Pay attention to any changes in your symptoms. Take these actions to help with your discomfort: Take or apply over-the-counter or prescription medicines only as told by your health care provider. This includes eye drops. Stay away from flickering lights. Avoid bright lights. Protect your eyes from the light. When you are outside, consider wearing: Dark sunglasses. A hat that has a wide brim. An eye patch. Keep all follow-up visits. This is important. Where to find support: Photophobic Society of America: www.photophobics.org Contact a health care provider if: Your eyes feel dry or itchy. You have eye pain in one eye or both eyes. You have discharge coming from one eye or both eyes. Your condition does not improve. You have photophobia and you also have: Blurry vision. A headache. Red eyes. Get help right away if: You have severe eye pain even when you are not exposed to bright lights. You have severe neck pain or stiffness. These symptoms may be an emergency. Get help right away. Call 911. Do not wait to see if the symptoms will go away. Do not drive yourself to the hospital. Summary Photophobia is an eye condition that means your eyes are very sensitive to light. It can be caused by  a number of disorders including eye and neurological problems. When you are outside, consider wearing dark sunglasses, a hat, or an eye patch. Take or apply over-the-counter or prescription medicines only as told by your health care provider. This includes eye drops. This information is not intended to replace advice given to you by your health care provider. Make sure you discuss any questions you have with your health care provider. Document Revised: 11/29/2020 Document Reviewed: 11/29/2020 Elsevier Patient Education  2023 Elsevier Inc.  

## 2022-04-02 ENCOUNTER — Ambulatory Visit
Admission: RE | Admit: 2022-04-02 | Discharge: 2022-04-02 | Disposition: A | Discharge status: Home / Self Care | Payer: Medicare Other | Source: Ambulatory Visit | Attending: Nurse Practitioner | Admitting: Nurse Practitioner

## 2022-04-02 ENCOUNTER — Telehealth: Payer: Self-pay

## 2022-04-02 ENCOUNTER — Ambulatory Visit: Payer: Medicare Other | Admitting: Nurse Practitioner

## 2022-04-02 DIAGNOSIS — N6489 Other specified disorders of breast: Secondary | ICD-10-CM

## 2022-04-02 DIAGNOSIS — R928 Other abnormal and inconclusive findings on diagnostic imaging of breast: Secondary | ICD-10-CM

## 2022-04-02 NOTE — Telephone Encounter (Signed)
Transition Care Management Unsuccessful Follow-up Telephone Call  Date of discharge and from where:  04/01/22 [facility]  Attempts:  1st Attempt  Reason for unsuccessful TCM follow-up call:  Left voice message

## 2022-04-03 NOTE — Progress Notes (Signed)
Please let patient know her Mammogram did not show any evidence of a malignancy.  The recommendation is to repeat the Mammogram in 1 year.  

## 2022-04-05 ENCOUNTER — Encounter: Payer: Self-pay | Admitting: Nurse Practitioner

## 2022-04-05 ENCOUNTER — Ambulatory Visit (INDEPENDENT_AMBULATORY_CARE_PROVIDER_SITE_OTHER): Payer: Medicare Other | Admitting: Nurse Practitioner

## 2022-04-05 VITALS — BP 155/78 | HR 73 | Temp 98.9°F | Wt 284.9 lb

## 2022-04-05 DIAGNOSIS — J0110 Acute frontal sinusitis, unspecified: Secondary | ICD-10-CM

## 2022-04-05 MED ORDER — METRONIDAZOLE 0.75 % VA GEL: 1.0000 | Freq: Every day | VAGINAL | 0 refills | Status: DC

## 2022-04-05 NOTE — Progress Notes (Signed)
BP (!) 155/78   Pulse 73   Temp 98.9 F (37.2 C) (Oral)   Wt 284 lb 14.4 oz (129.2 kg)   SpO2 96%   BMI 48.90 kg/m    Subjective:    Patient ID: Michelle Jenkins, female    DOB: 05-17-1945, 76 y.o.   MRN: [id number]  HPI: Michelle Jenkins is a 76 y.o. female  Chief Complaint  Patient presents with   Sinus Problem   ACUTE SINUSITIS Patient states she was seen in the ER for acute sinusitis and eye burning.  She was given Keflex and eye drops.  She is still taking the medications and she is feeling better now.  Denies concerns at visit today.    Relevant past medical, surgical, family and social history reviewed and updated as indicated. Interim medical history since our last visit reviewed. Allergies and medications reviewed and updated.  Review of Systems  All other systems reviewed and are negative.   Per HPI unless specifically indicated above     Objective:    BP (!) 155/78   Pulse 73   Temp 98.9 F (37.2 C) (Oral)   Wt 284 lb 14.4 oz (129.2 kg)   SpO2 96%   BMI 48.90 kg/m   Wt Readings from Last 3 Encounters:  04/05/22 284 lb 14.4 oz (129.2 kg)  03/31/22 286 lb 9.6 oz (130 kg)  03/27/22 288 lb 1.6 oz (130.7 kg)    Physical Exam Vitals and nursing note reviewed.  Constitutional:      General: She is not in acute distress.    Appearance: Normal appearance. She is obese. She is not ill-appearing, toxic-appearing or diaphoretic.  HENT:     Head: Normocephalic.     Right Ear: External ear normal.     Left Ear: External ear normal.     Nose: Nose normal.     Mouth/Throat:     Mouth: Mucous membranes are moist.     Pharynx: Oropharynx is clear.  Eyes:     General:        Right eye: No discharge.        Left eye: No discharge.     Extraocular Movements: Extraocular movements intact.     Conjunctiva/sclera: Conjunctivae normal.     Pupils: Pupils are equal, round, and reactive to light.  Cardiovascular:     Rate and Rhythm: Normal rate and regular  rhythm.     Heart sounds: No murmur heard. Pulmonary:     Effort: Pulmonary effort is normal. No respiratory distress.     Breath sounds: Normal breath sounds. No wheezing or rales.  Musculoskeletal:     Cervical back: Normal range of motion and neck supple.  Skin:    General: Skin is warm and dry.     Capillary Refill: Capillary refill takes less than 2 seconds.  Neurological:     General: No focal deficit present.     Mental Status: She is alert and oriented to person, place, and time. Mental status is at baseline.  Psychiatric:        Mood and Affect: Mood normal.        Behavior: Behavior normal.        Thought Content: Thought content normal.        Judgment: Judgment normal.     Results for orders placed or performed during the hospital encounter of 03/31/22  Resp panel by RT-PCR (RSV, Flu A&B, Covid) Anterior Nasal Swab   Specimen: Anterior Nasal  Swab  Result Value Ref Range   SARS Coronavirus 2 by RT PCR NEGATIVE NEGATIVE   Influenza A by PCR NEGATIVE NEGATIVE   Influenza B by PCR NEGATIVE NEGATIVE   Resp Syncytial Virus by PCR NEGATIVE NEGATIVE      Assessment & Plan:   Problem List Items Addressed This Visit   None Visit Diagnoses     Acute non-recurrent frontal sinusitis    -  Primary   Treated in the ER with Keflex.  Complete course of antibiotics.  Follow up if symptoms not improved.        Follow up plan: Return if symptoms worsen or fail to improve.

## 2022-04-24 ENCOUNTER — Encounter (HOSPITAL_BASED_OUTPATIENT_CLINIC_OR_DEPARTMENT_OTHER): Payer: Medicare Other | Admitting: Internal Medicine

## 2022-04-30 ENCOUNTER — Ambulatory Visit: Payer: Self-pay | Admitting: *Deleted

## 2022-04-30 ENCOUNTER — Telehealth: Payer: Self-pay | Admitting: Nurse Practitioner

## 2022-04-30 MED ORDER — METRONIDAZOLE 500 MG PO TABS: 500.0000 mg | ORAL_TABLET | Freq: Two times a day (BID) | ORAL | 0 refills | Status: AC

## 2022-04-30 NOTE — Telephone Encounter (Signed)
Patient notified of medication instructions and verbalized understanding.

## 2022-04-30 NOTE — Telephone Encounter (Signed)
Called and LVM notifed patient of Karen's message.

## 2022-04-30 NOTE — Telephone Encounter (Signed)
Pt stated that she finished metroNIDAZOLE (METROGEL) 0.75 % vaginal gel [[id number]]  And the smell has not gotten any better / she asked if there is something stronger to help with the issue / please advise

## 2022-04-30 NOTE — Telephone Encounter (Signed)
I sent in oral flagyl for patient to take twice daily for 7 days.

## 2022-04-30 NOTE — Telephone Encounter (Signed)
Summary: rx concern   The patient has picked up their prescription for metroNIDAZOLE (FLAGYL) 500 MG tablet [[id number]]  The patient is concerned with the directions that state that they are not supposed to take the medication with any other medications  The patient has not taken the medication at the time of call with agent  Please contact the patient further when possible to review       Reason for Disposition  [1] Caller has URGENT medicine question about med that PCP or specialist prescribed AND [2] triager unable to answer question  Answer Assessment - Initial Assessment Questions 1. NAME of MEDICINE: "What medicine(s) are you calling about?"     Metronidazole  2. QUESTION: "What is your question?" (e.g., double dose of medicine, side effect)     Patient states she is concerned about warning on bottle- she would like for her provider to instruct her on when to take medication.  3. PRESCRIBER: "Who prescribed the medicine?" Reason: if prescribed by specialist, call should be referred to that group.     PCP  Patient states instructions states do not take with other medications- she takes morning and evening medications. Patient advised the main SE with this drug are stomach upset and alcohol interaction. Patient may be advised to take after meals- will send message to provider for more specific instruction  Protocols used: Medication Question Call-A-AH

## 2022-04-30 NOTE — Telephone Encounter (Signed)
Medication is to be taken once in the morning and once in the evening.

## 2022-04-30 NOTE — Telephone Encounter (Signed)
  Chief Complaint: medication question  Symptoms: patient wants specific instructions on when to take the Metronidazole Frequency: twice daily for 7 days Pertinent Negatives: Patient denies   Disposition: '[]'$ ED /'[]'$ Urgent Care (no appt availability in office) / '[]'$ Appointment(In office/virtual)/ '[]'$  [facility]/ '[]'$ Home Care/ '[]'$ Refused Recommended Disposition /'[]'$ [facility]/ '[x]'$  Follow-up with PCP Additional Notes: Patient is requesting specific time to take medication- she is concerned about it interfereing with her daily medications

## 2022-05-10 ENCOUNTER — Telehealth: Payer: Self-pay | Admitting: Nurse Practitioner

## 2022-05-10 NOTE — Telephone Encounter (Signed)
Pt is calling to report that she has finshed the medication metroNIDAZOLE (FLAGYL) 500 MG tablet [[id number]] and the smell has stopped. Please advise [phone]- [phone]

## 2022-05-15 ENCOUNTER — Ambulatory Visit (INDEPENDENT_AMBULATORY_CARE_PROVIDER_SITE_OTHER): Payer: 59 | Admitting: Nurse Practitioner

## 2022-05-15 ENCOUNTER — Encounter: Payer: Self-pay | Admitting: Nurse Practitioner

## 2022-05-15 VITALS — BP 129/77 | HR 66 | Temp 99.4°F

## 2022-05-15 DIAGNOSIS — B9689 Other specified bacterial agents as the cause of diseases classified elsewhere: Secondary | ICD-10-CM | POA: Diagnosis not present

## 2022-05-15 DIAGNOSIS — N760 Acute vaginitis: Secondary | ICD-10-CM | POA: Diagnosis not present

## 2022-05-15 DIAGNOSIS — N898 Other specified noninflammatory disorders of vagina: Secondary | ICD-10-CM | POA: Diagnosis not present

## 2022-05-15 MED ORDER — METRONIDAZOLE 500 MG PO TABS: 500.0000 mg | ORAL_TABLET | Freq: Two times a day (BID) | ORAL | 0 refills | Status: AC

## 2022-05-15 NOTE — Progress Notes (Signed)
BP 129/77   Pulse 66   Temp 99.4 F (37.4 C) (Oral)   SpO2 96%    Subjective:    Patient ID: Michelle Jenkins, female    DOB: [birth year], 77 y.o.   MRN: [id number]  HPI: Michelle Jenkins is a 77 y.o. female  Chief Complaint  Patient presents with   Vaginal Odor    Pt states she is having vaginal odor again. States the oral Flagyl took care of the odor but she has since developed an odor again.    VAGINAL DISCHARGE Pt states she is having vaginal odor again. States the oral Flagyl took care of the odor but she has since developed an odor again.  Duration: weeks Discharge description:  none   Pruritus: no Dysuria: no Malodorous: yes Urinary frequency: yes   Relevant past medical, surgical, family and social history reviewed and updated as indicated. Interim medical history since our last visit reviewed. Allergies and medications reviewed and updated.  Review of Systems  Constitutional:  Negative for fever.  Gastrointestinal:  Negative for abdominal pain and vomiting.  Genitourinary:  Negative for decreased urine volume, dysuria, flank pain, frequency, hematuria and urgency.       Vaginal odor  Musculoskeletal:  Negative for back pain.    Per HPI unless specifically indicated above     Objective:    BP 129/77   Pulse 66   Temp 99.4 F (37.4 C) (Oral)   SpO2 96%   Wt Readings from Last 3 Encounters:  04/05/22 284 lb 14.4 oz (129.2 kg)  03/31/22 286 lb 9.6 oz (130 kg)  03/27/22 288 lb 1.6 oz (130.7 kg)    Physical Exam Vitals and nursing note reviewed.  Constitutional:      General: She is not in acute distress.    Appearance: Normal appearance. She is normal weight. She is not ill-appearing, toxic-appearing or diaphoretic.  HENT:     Head: Normocephalic.     Right Ear: External ear normal.     Left Ear: External ear normal.     Nose: Nose normal.     Mouth/Throat:     Mouth: Mucous membranes are moist.     Pharynx: Oropharynx is clear.  Eyes:      General:        Right eye: No discharge.        Left eye: No discharge.     Extraocular Movements: Extraocular movements intact.     Conjunctiva/sclera: Conjunctivae normal.     Pupils: Pupils are equal, round, and reactive to light.  Cardiovascular:     Rate and Rhythm: Normal rate and regular rhythm.     Heart sounds: No murmur heard. Pulmonary:     Effort: Pulmonary effort is normal. No respiratory distress.     Breath sounds: Normal breath sounds. No wheezing or rales.  Musculoskeletal:     Cervical back: Normal range of motion and neck supple.  Skin:    General: Skin is warm and dry.     Capillary Refill: Capillary refill takes less than 2 seconds.  Neurological:     General: No focal deficit present.     Mental Status: She is alert and oriented to person, place, and time. Mental status is at baseline.  Psychiatric:        Mood and Affect: Mood normal.        Behavior: Behavior normal.        Thought Content: Thought content normal.  Judgment: Judgment normal.     Results for orders placed or performed during the hospital encounter of 03/31/22  Resp panel by RT-PCR (RSV, Flu A&B, Covid) Anterior Nasal Swab   Specimen: Anterior Nasal Swab  Result Value Ref Range   SARS Coronavirus 2 by RT PCR NEGATIVE NEGATIVE   Influenza A by PCR NEGATIVE NEGATIVE   Influenza B by PCR NEGATIVE NEGATIVE   Resp Syncytial Virus by PCR NEGATIVE NEGATIVE      Assessment & Plan:   Problem List Items Addressed This Visit   None Visit Diagnoses     Bacterial vaginosis    -  Primary   Clue cells found on Wet prep.  Urinalysis was neg. Will treat with another round of Flagyl.  If symptoms return, will treat with clindamycin.   Relevant Medications   metroNIDAZOLE (FLAGYL) 500 MG tablet   Vaginal odor       Relevant Orders   WET PREP FOR TRICH, YEAST, CLUE   Urinalysis, Routine w reflex microscopic        Follow up plan: Return if symptoms worsen or fail to  improve.

## 2022-05-16 LAB — URINALYSIS, ROUTINE W REFLEX MICROSCOPIC
Bilirubin, UA: NEGATIVE
Glucose, UA: NEGATIVE
Ketones, UA: NEGATIVE
Leukocytes,UA: NEGATIVE
Nitrite, UA: NEGATIVE
Protein,UA: NEGATIVE
RBC, UA: NEGATIVE
Specific Gravity, UA: 1.015 (ref 1.005–1.030)
Urobilinogen, Ur: 0.2 mg/dL (ref 0.2–1.0)
pH, UA: 5.5 (ref 5.0–7.5)

## 2022-05-16 LAB — WET PREP FOR TRICH, YEAST, CLUE
Clue Cell Exam: POSITIVE — AB
Trichomonas Exam: NEGATIVE
Yeast Exam: NEGATIVE

## 2022-05-16 NOTE — Progress Notes (Signed)
Results discussed with patient during visit.

## 2022-06-04 ENCOUNTER — Other Ambulatory Visit: Payer: Self-pay | Admitting: Nurse Practitioner

## 2022-06-06 NOTE — Telephone Encounter (Signed)
Requested Prescriptions  Pending Prescriptions Disp Refills   labetalol (NORMODYNE) 200 MG tablet [Pharmacy Med Name: LABETALOL 200MG TABLETS] 180 tablet 0    Sig: TAKE 1 TABLET(200 MG) BY MOUTH TWICE DAILY     Cardiovascular:  Beta Blockers Passed - 06/04/2022  4:22 PM      Passed - Last BP in normal range    BP Readings from Last 1 Encounters:  05/15/22 129/77         Passed - Last Heart Rate in normal range    Pulse Readings from Last 1 Encounters:  05/15/22 66         Passed - Valid encounter within last 6 months    Recent Outpatient Visits           3 weeks ago Bacterial vaginosis   [place], NP   2 months ago Acute non-recurrent frontal sinusitis   [place], NP   2 months ago Vaginal odor   [place] Kathrine Haddock, NP   3 months ago Encounter for [employer] annual wellness exam   [place] Jon Billings, NP   9 months ago Acute pain of right shoulder   [place] Jon Billings, NP       Future Appointments             In 2 months Jon Billings, NP [place]

## 2022-07-02 ENCOUNTER — Ambulatory Visit (INDEPENDENT_AMBULATORY_CARE_PROVIDER_SITE_OTHER): Payer: 59 | Admitting: Internal Medicine

## 2022-07-02 VITALS — BP 123/68 | HR 57 | Resp 16 | Ht 65.0 in | Wt 284.0 lb

## 2022-07-02 DIAGNOSIS — I270 Primary pulmonary hypertension: Secondary | ICD-10-CM | POA: Diagnosis not present

## 2022-07-02 DIAGNOSIS — G4733 Obstructive sleep apnea (adult) (pediatric): Secondary | ICD-10-CM

## 2022-07-02 NOTE — Patient Instructions (Signed)
Living With Sleep Apnea Sleep apnea is a condition in which breathing pauses or becomes shallow during sleep. Sleep apnea is most commonly caused by a collapsed or blocked airway. People with sleep apnea usually snore loudly. They may have times when they gasp and stop breathing for 10 seconds or more during sleep. This may happen many times during the night. The breaks in breathing also interrupt the deep sleep that you need to feel rested. Even if you do not completely wake up from the gaps in breathing, your sleep may not be restful and you feel tired during the day. You may also have a headache in the morning and low energy during the day, and you may feel anxious or depressed. How can sleep apnea affect me? Sleep apnea increases your chances of extreme tiredness during the day (daytime fatigue). It can also increase your risk for health conditions, such as: Heart attack. Stroke. Obesity. Type 2 diabetes. Heart failure. Irregular heartbeat. High blood pressure. If you have daytime fatigue as a result of sleep apnea, you may be more likely to: Perform poorly at school or work. Fall asleep while driving. Have difficulty with attention. Develop depression or anxiety. Have sexual dysfunction. What actions can I take to manage sleep apnea? Sleep apnea treatment  If you were given a device to open your airway while you sleep, use it only as told by your health care provider. You may be given: An oral appliance. This is a custom-made mouthpiece that shifts your lower jaw forward. A continuous positive airway pressure (CPAP) device. This device blows air through a mask when you breathe out (exhale). A nasal expiratory positive airway pressure (EPAP) device. This device has valves that you put into each nostril. A bi-level positive airway pressure (BIPAP) device. This device blows air through a mask when you breathe in (inhale) and breathe out (exhale). You may need surgery if other treatments  do not work for you. Sleep habits Go to sleep and wake up at the same time every day. This helps set your internal clock (circadian rhythm) for sleeping. If you stay up later than usual, such as on weekends, try to get up in the morning within 2 hours of your normal wake time. Try to get at least 7-9 hours of sleep each night. Stop using a computer, tablet, and mobile phone a few hours before bedtime. Do not take long naps during the day. If you nap, limit it to 30 minutes. Have a relaxing bedtime routine. Reading or listening to music may relax you and help you sleep. Use your bedroom only for sleep. Keep your television and computer out of your bedroom. Keep your bedroom cool, dark, and quiet. Use a supportive mattress and pillows. Follow your health care provider's instructions for other changes to sleep habits. Nutrition Do not eat heavy meals in the evening. Do not have caffeine in the later part of the day. The effects of caffeine can last for more than 5 hours. Follow your health care provider's or dietitian's instructions for any diet changes. Lifestyle     Do not drink alcohol before bedtime. Alcohol can cause you to fall asleep at first, but then it can cause you to wake up in the middle of the night and have trouble getting back to sleep. Do not use any products that contain nicotine or tobacco. These products include cigarettes, chewing tobacco, and vaping devices, such as e-cigarettes. If you need help quitting, ask your health care provider. Medicines Take   over-the-counter and prescription medicines only as told by your health care provider. Do not use over-the-counter sleep medicine. You can become dependent on this medicine, and it can make sleep apnea worse. Do not use medicines, such as sedatives and narcotics, unless told by your health care provider. Activity Exercise on most days, but avoid exercising in the evening. Exercising near bedtime can interfere with  sleeping. If possible, spend time outside every day. Natural light helps regulate your circadian rhythm. General information Lose weight if you need to, and maintain a healthy weight. Keep all follow-up visits. This is important. If you are having surgery, make sure to tell your health care provider that you have sleep apnea. You may need to bring your device with you. Where to find more information Learn more about sleep apnea and daytime fatigue from: American Sleep Association: sleepassociation.org National Sleep Foundation: sleepfoundation.org National Heart, Lung, and Blood Institute: nhlbi.nih.gov Summary Sleep apnea is a condition in which breathing pauses or becomes shallow during sleep. Sleep apnea can cause daytime fatigue and other serious health conditions. You may need to wear a device while sleeping to help keep your airway open. If you are having surgery, make sure to tell your health care provider that you have sleep apnea. You may need to bring your device with you. Making changes to sleep habits, diet, lifestyle, and activity can help you manage sleep apnea. This information is not intended to replace advice given to you by your health care provider. Make sure you discuss any questions you have with your health care provider. Document Revised: 11/09/2020 Document Reviewed: 03/11/2020 Elsevier Patient Education  2023 [employer]  

## 2022-07-02 NOTE — Progress Notes (Signed)
Sleep Medicine   Office Visit  Patient Name: Michelle Jenkins DOB: 16-Dec-1945 MRN [id number]    Chief Complaint: establish care, history of OSA  Brief History:  Michelle Jenkins presents with a 10 year history of sleep apnea. The patient was diagnosed with sleep apnea but never started on PAP therapy.   Sleep quality is poor. This is noted most nights. The patient's bed partner have not reported any symptoms at night. The patient relates the following symptoms: Insomnia, frequent awakenings, and brain fog are also present. The patient goes to sleep at 11:30 pm and wakes up at 10 am.   Sleep quality is same when outside home environment.  Patient has noted restlessness and cramping of her legs at night.  The patient  relates no unusual behavior during the night.  The patient denies  a history of psychiatric problems. The Epworth Sleepiness Score is 0 out of 24 .  The patient relates  Cardiovascular risk factors include: pulmonary hypertension and hypertension.      ROS  General: (-) fever, (-) chills, (-) night sweat Nose and Sinuses: (-) nasal stuffiness or itchiness, (-) postnasal drip, (-) nosebleeds, (-) sinus trouble. Mouth and Throat: (-) sore throat, (-) hoarseness. Neck: (-) swollen glands, (-) enlarged thyroid, (-) neck pain. Respiratory: + cough, + shortness of breath, + wheezing. Neurologic: - numbness, - tingling. Psychiatric: - anxiety, - depression Sleep behavior: -sleep paralysis -hypnogogic hallucinations -dream enactment      -vivid dreams -cataplexy -night terrors -sleep walking   Current Medication: Outpatient Encounter Medications as of 07/02/2022  Medication Sig   ambrisentan (LETAIRIS) 10 MG tablet Take 10 mg by mouth daily.   apixaban (ELIQUIS) 5 MG TABS tablet Take 1 tablet (5 mg total) by mouth 2 (two) times daily.   atorvastatin (LIPITOR) 20 MG tablet TAKE 1 TABLET(20 MG) BY MOUTH DAILY   ezetimibe (ZETIA) 10 MG tablet TAKE 1 TABLET(10 MG) BY MOUTH DAILY   furosemide  (LASIX) 20 MG tablet Take 1 tablet (20 mg total) by mouth 2 (two) times daily. TAKE 1 TABLET(20 MG) BY MOUTH TWICE DAILY AS NEEDED   gabapentin (NEURONTIN) 300 MG capsule TAKE 2 CAPSULES(600 MG) BY MOUTH AT BEDTIME AS NEEDED   labetalol (NORMODYNE) 200 MG tablet TAKE 1 TABLET(200 MG) BY MOUTH TWICE DAILY   methocarbamol (ROBAXIN) 500 MG tablet    olmesartan-hydrochlorothiazide (BENICAR HCT) 20-12.5 MG tablet Take 1 tablet by mouth daily.   Omega-3 Fatty Acids (FISH OIL PO) Take 1 Dose by mouth daily.   ondansetron (ZOFRAN ODT) 4 MG disintegrating tablet Take 1 tablet (4 mg total) by mouth every 8 (eight) hours as needed for nausea or vomiting.   pantoprazole (PROTONIX) 40 MG tablet TAKE 1 TABLET(40 MG) BY MOUTH DAILY   potassium chloride (KLOR-CON) 10 MEQ tablet TAKE 1 TABLET(10 MEQ) BY MOUTH EVERY DAY   Psyllium (METAMUCIL FIBER PO) Take 1 Dose by mouth. Patient uses Metamucil Powder in the AM   VENTOLIN HFA 108 (90 Base) MCG/ACT inhaler    XYLIMELTS 550 MG DISK DISSOLVE 1 TABLET ON THE TONGUE EVERY 8 HOURS AS NEEDED FOR DRY MOUTH.   No facility-administered encounter medications on file as of 07/02/2022.    Surgical History: Past Surgical History:  Procedure Laterality Date   BREAST BIOPSY Right 11/15/2015   stereo, COMPLEX SCLEROSING LESION WITH INTRADUCTAL PAPILLOMA COMPONENT AND    BREAST EXCISIONAL BIOPSY Right 02/27/2016   NEG   BREAST LUMPECTOMY WITH NEEDLE LOCALIZATION Right 02/27/2016   Procedure: BREAST LUMPECTOMY  WITH NEEDLE LOCALIZATION;  Surgeon: Christene Lye, MD;  Location: [facility];  Service: General;  Laterality: Right;   CARDIAC CATHETERIZATION     CARDIAC CATHETERIZATION Right 12/07/2014   Procedure: Right and Left Heart Cath with Coronary Angiography;  Surgeon: Teodoro Spray, MD;  Location: [facility];  Service: Cardiovascular;  Laterality: Right;   CARPAL TUNNEL RELEASE  1998  and 1999   CATARACT EXTRACTION W/PHACO Right 09/24/2019   Procedure:  CATARACT EXTRACTION PHACO AND INTRAOCULAR LENS PLACEMENT ([place]) RIGHT;  Surgeon: Birder Robson, MD;  Location: [facility];  Service: Ophthalmology;  Laterality: Right;  [id number] [id number]   COLONOSCOPY  10/31/1998   DILATION AND CURETTAGE OF UTERUS     FOOT SURGERY Right    HYSTEROSCOPY     JOINT REPLACEMENT Left    KNEE ARTHROSCOPY AND ARTHROTOMY Right    lt knee replacement     TONSILLECTOMY     UVULOPALATOPHARYNGOPLASTY      Medical History: Past Medical History:  Diagnosis Date   Anginal pain (HCC)    Asthma    Bronchitis    CAD (coronary artery disease)    CHF (congestive heart failure) (HCC)    CVA (cerebral infarction)    Dysrhythmia    GERD (gastroesophageal reflux disease)    History of chronic atrial fibrillation    Hypercholesteremia    Hypertension    Idiopathic pulmonary hypertension (HCC)    Myocardial infarction (HCC)    Obesity    OSA (obstructive sleep apnea)    Pre-diabetes    Pulmonary artery hypertension (HCC)    Shortness of breath dyspnea    Sleep apnea    Stroke ([place])     Family History: Non contributory to the present illness  Social History: Social History   Socioeconomic History   Marital status: Single    Spouse name: Not on file   Number of children: Not on file   Years of education: Not on file   Highest education level: 8th grade  Occupational History   Occupation: retired  Tobacco Use   Smoking status: Former    Packs/day: .25    Types: Cigarettes    Quit date: 06/15/2014    Years since quitting: 8.0   Smokeless tobacco: Never   Tobacco comments:    4-5 cigarettes a day when she smoked  Vaping Use   Vaping Use: Never used  Substance and Sexual Activity   Alcohol use: Yes    Alcohol/week: 0.0 - 1.0 standard drinks of alcohol    Comment: occasionally   Drug use: No   Sexual activity: Not Currently  Other Topics Concern   Not on file  Social History Narrative   Not on file   Social Determinants of Health    Financial Resource Strain: Low Risk  (02/22/2021)   Overall Financial Resource Strain (CARDIA)    Difficulty of Paying Living Expenses: Not very hard  Food Insecurity: No Food Insecurity (02/22/2021)   Hunger Vital Sign    Worried About Running Out of Food in the Last Year: Never true    Ran Out of Food in the Last Year: Never true  Transportation Needs: No Transportation Needs (02/22/2021)   PRAPARE - Hydrologist (Medical): No    Lack of Transportation (Non-Medical): No  Physical Activity: Inactive (02/22/2021)   Exercise Vital Sign    Days of Exercise per Week: 0 days    Minutes of Exercise per Session: 0 min  Stress: No Stress Concern Present (02/22/2021)   [place]    Feeling of Stress : Not at all  Social Connections: Unknown (02/22/2021)   Social Connection and Isolation Panel [NHANES]    Frequency of Communication with Friends and Family: Three times a week    Frequency of Social Gatherings with Friends and Family: Three times a week    Attends Religious Services: 1 to 4 times per year    Active Member of Clubs or Organizations: No    Attends Archivist Meetings: Never    Marital Status: Not on file  Intimate Partner Violence: Not At Risk (02/22/2021)   Humiliation, Afraid, Rape, and Kick questionnaire    Fear of Current or Ex-Partner: No    Emotionally Abused: No    Physically Abused: No    Sexually Abused: No    Vital Signs: Blood pressure 123/68, pulse (!) 57, resp. rate 16, height 5\' 5"  (1.651 m), weight 284 lb (128.8 kg), SpO2 94 %. Body mass index is 47.26 kg/m.   Examination: General Appearance: The patient is well-developed, well-nourished, and in no distress. Neck Circumference: 40 cm Skin: Gross inspection of skin unremarkable. Head: normocephalic, no gross deformities. Eyes: no gross deformities noted. ENT: ears appear grossly normal Neurologic: Alert  and oriented. No involuntary movements.    STOP BANG RISK ASSESSMENT S (snore) Have you been told that you snore?     NO   T (tired) Are you often tired, fatigued, or sleepy during the day?   NO  O (obstruction) Do you stop breathing, choke, or gasp during sleep? NO   P (pressure) Do you have or are you being treated for high blood pressure? YES   B (BMI) Is your body index greater than 35 kg/m? YES   A (age) Are you 28 years old or older? YES   N (neck) Do you have a neck circumference greater than 16 inches?   YES   G (gender) Are you a female? NO   TOTAL STOP/BANG "YES" ANSWERS 4                                                               A STOP-Bang score of 2 or less is considered low risk, and a score of 5 or more is high risk for having either moderate or severe OSA. For people who score 3 or 4, doctors may need to perform further assessment to determine how likely they are to have OSA.         EPWORTH SLEEPINESS SCALE:  Scale:  (0)= no chance of dozing; (1)= slight chance of dozing; (2)= moderate chance of dozing; (3)= high chance of dozing  Chance  Situtation    Sitting and reading: 0    Watching TV: 0    Sitting Inactive in public: 0    As a passenger in car: 0      Lying down to rest: 0    Sitting and talking: 0    Sitting quielty after lunch: 0    In a car, stopped in traffic: 0   TOTAL SCORE:   0 out of 24    SLEEP STUDIES:  SPLIT (04/24/12)  AHI 107.4, min SPO2 85%, recommended BIPAP titration.  NOT enough sleep time during diagnostic portion for insurance coverage, TST 71.5 minutes. TITRATION (04/26/12) recommended Auto BIPAP at EPAP min 16, IPAP min 4, max pressure 25.  Avoid sleeping supine.   LABS: Recent Results (from the past 2160 hour(s))  WET PREP FOR TRICH, YEAST, CLUE     Status: Abnormal   Collection Time: 05/15/22  4:15 PM   Specimen: Urine   Urine  Result Value Ref Range   Trichomonas Exam Negative Negative   Yeast Exam  Negative Negative   Clue Cell Exam Positive (A) Negative  Urinalysis, Routine w reflex microscopic     Status: None   Collection Time: 05/15/22  4:15 PM  Result Value Ref Range   Specific Gravity, UA 1.015 1.005 - 1.030   pH, UA 5.5 5.0 - 7.5   Color, UA Yellow Yellow   Appearance Ur Clear Clear   Leukocytes,UA Negative Negative   Protein,UA Negative Negative/Trace   Glucose, UA Negative Negative   Ketones, UA Negative Negative   RBC, UA Negative Negative   Bilirubin, UA Negative Negative   Urobilinogen, Ur 0.2 0.2 - 1.0 mg/dL   Nitrite, UA Negative Negative   Microscopic Examination Comment     Comment: Microscopic not indicated and not performed.    Radiology: MM DIAG BREAST TOMO UNI RIGHT  Result Date: 04/02/2022 CLINICAL DATA:  Screening recall for a right breast asymmetry with calcifications. EXAM: DIGITAL DIAGNOSTIC UNILATERAL RIGHT MAMMOGRAM WITH TOMOSYNTHESIS TECHNIQUE: Right digital diagnostic mammography and breast tomosynthesis was performed. COMPARISON:  Previous exam(s). ACR Breast Density Category b: There are scattered areas of fibroglandular density. FINDINGS: Spot compression tomosynthesis images through the superior posterior right breast demonstrates a stable asymmetry compared with multiple prior mammograms. There are no suspicious grouped calcifications within this area. IMPRESSION: Stable asymmetry in the superior posterior right breast. No suspicious associated calcifications. RECOMMENDATION: Screening mammogram in one year.(Code:SM-B-01Y) I have discussed the findings and recommendations with the patient. If applicable, a reminder letter will be sent to the patient regarding the next appointment. BI-RADS CATEGORY  2: Benign. Electronically Signed   By: Ammie Ferrier M.D.   On: 04/02/2022 14:55    No results found.  No results found.    Assessment and Plan: Patient Active Problem List   Diagnosis Date Noted   Advanced care planning/counseling discussion  03/01/2022   IFG (impaired fasting glucose) 07/10/2019   Peripheral neuropathy 05/01/2018   GERD (gastroesophageal reflux disease) 12/12/2017   Atrial fibrillation ([place]) 08/08/2016   Hypercholesteremia    Hypertension    Pulmonary artery hypertension (HCC)    Primary pulmonary hypertension ([place]) 11/30/2014   CAD in native artery 11/30/2014   Obstructive apnea 07/30/2014   CHF (congestive heart failure) ([place]) 07/30/2014   PMB (postmenopausal bleeding) 04/20/2014   1. OSA (obstructive sleep apnea) PLAN OSA:   Patient evaluation suggests high risk of sleep disordered breathing due to history of severe OSA (AHI was 107.4 on split study in 2014, but sleep time was not adequate to qualify)  Patient has comorbid cardiovascular risk factors including: hypertension,  pulmonary hypertension and atrial fibrillation  which could be exacerbated by pathologic sleep-disordered breathing.  Suggest: Split study  to assess/treat the patient's sleep disordered breathing. She states Michelle Jenkins is using a machine nightly but does not have her records. She states she needs a new machine. The patient was also counselled on weight loss to optimize sleep health.   2. Primary pulmonary hypertension ([place]) Being followed by cardiology and pulmonary.  General Counseling: I have discussed the findings of the evaluation and examination with Michelle Jenkins.  I have also discussed any further diagnostic evaluation thatmay be needed or ordered today. Michelle Jenkins verbalizes understanding of the findings of todays visit. We also reviewed her medications today and discussed drug interactions and side effects including but not limited excessive drowsiness and altered mental states. We also discussed that there is always a risk not just to her but also people around her. she has been encouraged to call the office with any questions or concerns that should arise related to todays visit.  No orders of the defined types were placed in this  encounter.       I have personally obtained a history, evaluated the patient, evaluated pertinent data, formulated the assessment and plan and placed orders.   This patient was seen today by Tressie Ellis, PA-C in collaboration with Dr. Devona Konig.   Allyne Gee, MD [facility] Diplomate ABMS Pulmonary and Critical Care Medicine Sleep medicine

## 2022-08-15 ENCOUNTER — Other Ambulatory Visit: Payer: Self-pay | Admitting: Nurse Practitioner

## 2022-08-16 NOTE — Telephone Encounter (Signed)
Requested Prescriptions  Pending Prescriptions Disp Refills   ezetimibe (ZETIA) 10 MG tablet [Pharmacy Med Name: EZETIMIBE 10MG  TABLETS] 90 tablet 1    Sig: TAKE 1 TABLET(10 MG) BY MOUTH DAILY     Cardiovascular:  Antilipid - Sterol Transport Inhibitors Failed - 08/15/2022  8:08 PM      Failed - Lipid Panel in normal range within the last 12 months    Cholesterol, Total  Date Value Ref Range Status  03/01/2022 156 100 - 199 mg/dL Final   Cholesterol Piccolo, Waived  Date Value Ref Range Status  09/05/2016 154 <200 mg/dL Final    Comment:                            Desirable                <200                         Borderline High      200- 239                         High                     >239    LDL Chol Calc (NIH)  Date Value Ref Range Status  03/01/2022 64 0 - 99 mg/dL Final   HDL  Date Value Ref Range Status  03/01/2022 81 >39 mg/dL Final   Triglycerides  Date Value Ref Range Status  03/01/2022 50 0 - 149 mg/dL Final   Triglycerides Piccolo,Waived  Date Value Ref Range Status  09/05/2016 46 <150 mg/dL Final    Comment:                            Normal                   <150                         Borderline High     150 - 199                         High                200 - 499                         Very High                >499          Passed - AST in normal range and within 360 days    AST  Date Value Ref Range Status  03/01/2022 18 0 - 40 IU/L Final         Passed - ALT in normal range and within 360 days    ALT  Date Value Ref Range Status  03/01/2022 10 0 - 32 IU/L Final         Passed - Patient is not pregnant      Passed - Valid encounter within last 12 months    Recent Outpatient Visits           3 months ago Bacterial vaginosis   [place] [facility] Larae Grooms, NP  4 months ago Acute non-recurrent frontal sinusitis   [place] [facility] Larae Grooms, NP   4 months ago Vaginal odor    [place] [facility] Gabriel Cirri, NP   5 months ago Encounter for [employer] annual wellness exam   [place] [facility] Larae Grooms, NP   11 months ago Acute pain of right shoulder   [place] [facility] Larae Grooms, NP       Future Appointments             In 2 weeks Larae Grooms, NP  [facility]

## 2022-08-30 ENCOUNTER — Encounter: Payer: Self-pay | Admitting: Nurse Practitioner

## 2022-08-30 ENCOUNTER — Ambulatory Visit (INDEPENDENT_AMBULATORY_CARE_PROVIDER_SITE_OTHER): Payer: 59 | Admitting: Nurse Practitioner

## 2022-08-30 VITALS — BP 110/73 | HR 63 | Temp 98.3°F | Ht 64.0 in | Wt 288.3 lb

## 2022-08-30 DIAGNOSIS — I4891 Unspecified atrial fibrillation: Secondary | ICD-10-CM | POA: Diagnosis not present

## 2022-08-30 DIAGNOSIS — I270 Primary pulmonary hypertension: Secondary | ICD-10-CM | POA: Diagnosis not present

## 2022-08-30 DIAGNOSIS — I509 Heart failure, unspecified: Secondary | ICD-10-CM | POA: Diagnosis not present

## 2022-08-30 DIAGNOSIS — Z0000 Encounter for general adult medical examination without abnormal findings: Secondary | ICD-10-CM | POA: Diagnosis not present

## 2022-08-30 DIAGNOSIS — I10 Essential (primary) hypertension: Secondary | ICD-10-CM

## 2022-08-30 DIAGNOSIS — R7301 Impaired fasting glucose: Secondary | ICD-10-CM | POA: Diagnosis not present

## 2022-08-30 DIAGNOSIS — E7800 Pure hypercholesterolemia, unspecified: Secondary | ICD-10-CM

## 2022-08-30 DIAGNOSIS — G609 Hereditary and idiopathic neuropathy, unspecified: Secondary | ICD-10-CM

## 2022-08-30 DIAGNOSIS — D649 Anemia, unspecified: Secondary | ICD-10-CM

## 2022-08-30 DIAGNOSIS — G4733 Obstructive sleep apnea (adult) (pediatric): Secondary | ICD-10-CM

## 2022-08-30 LAB — URINALYSIS, ROUTINE W REFLEX MICROSCOPIC
Bilirubin, UA: NEGATIVE
Glucose, UA: NEGATIVE
Ketones, UA: NEGATIVE
Leukocytes,UA: NEGATIVE
Nitrite, UA: NEGATIVE
Protein,UA: NEGATIVE
RBC, UA: NEGATIVE
Specific Gravity, UA: 1.015 (ref 1.005–1.030)
Urobilinogen, Ur: 0.2 mg/dL (ref 0.2–1.0)
pH, UA: 5.5 (ref 5.0–7.5)

## 2022-08-30 MED ORDER — PANTOPRAZOLE SODIUM 40 MG PO TBEC: DELAYED_RELEASE_TABLET | ORAL | 1 refills | Status: DC

## 2022-08-30 MED ORDER — FUROSEMIDE 20 MG PO TABS: 20.0000 mg | ORAL_TABLET | Freq: Two times a day (BID) | ORAL | 1 refills | Status: DC

## 2022-08-30 MED ORDER — OLMESARTAN MEDOXOMIL-HCTZ 20-12.5 MG PO TABS: 1.0000 | ORAL_TABLET | Freq: Every day | ORAL | 1 refills | Status: DC

## 2022-08-30 MED ORDER — EZETIMIBE 10 MG PO TABS: ORAL_TABLET | ORAL | 1 refills | Status: DC

## 2022-08-30 MED ORDER — LABETALOL HCL 200 MG PO TABS: ORAL_TABLET | ORAL | 1 refills | Status: DC

## 2022-08-30 MED ORDER — ATORVASTATIN CALCIUM 20 MG PO TABS: ORAL_TABLET | ORAL | 1 refills | Status: DC

## 2022-08-30 MED ORDER — GABAPENTIN 300 MG PO CAPS: ORAL_CAPSULE | ORAL | 3 refills | Status: DC

## 2022-08-30 MED ORDER — APIXABAN 5 MG PO TABS: 5.0000 mg | ORAL_TABLET | Freq: Two times a day (BID) | ORAL | 1 refills | Status: DC

## 2022-08-30 NOTE — Assessment & Plan Note (Signed)
Chronic.  Controlled.  Continue with current medication regimen of Atorvastatin 20mg daily.  Refills sent today.  Labs ordered today.  Return to clinic in 6 months for reevaluation.  Call sooner if concerns arise.  ? ?

## 2022-08-30 NOTE — Progress Notes (Signed)
BP 110/73   Pulse 63   Temp 98.3 F (36.8 C) (Oral)   Ht 5\' 4"  (1.626 m)   Wt 288 lb 4.8 oz (130.8 kg)   SpO2 92%   BMI 49.49 kg/m    Subjective:    Patient ID: Michelle Jenkins, female    DOB: March 31, 1946, 77 y.o.   MRN: [id number]  HPI: Michelle Jenkins is a 77 y.o. female presenting on 08/30/2022 for comprehensive medical examination. Current medical complaints include:none  She currently lives with: Menopausal Symptoms: no  HYPERTENSION / HYPERLIPIDEMIA Satisfied with current treatment? yes Duration of hypertension: years BP monitoring frequency: not checking BP range:  BP medication side effects: no Past BP meds: olmesartan-HCTZ, Lasix Duration of hyperlipidemia: years Cholesterol medication side effects: no Cholesterol supplements: none Past cholesterol medications: atorvastain (lipitor) Medication compliance: excellent compliance Aspirin: no Recent stressors: no Recurrent headaches: no Visual changes: no Palpitations: no Dyspnea: yes Chest pain: no Lower extremity edema: no Dizzy/lightheaded: no  COPD See's pulmonology on May 21. COPD status: controlled Satisfied with current treatment?: yes Oxygen use: yes (3L) Dyspnea frequency: SOB Cough frequency: Sometimes Rescue inhaler frequency:   Limitation of activity: yes Productive cough: yes Last Spirometry:  Pneumovax: Up to Date Influenza: Up to Date  Depression Screen done today and results listed below:     03/01/2022   10:05 AM 02/22/2021   11:01 AM 02/19/2020    1:05 PM 02/12/2019   10:23 AM 01/16/2018    9:53 AM  Depression screen PHQ 2/9  Decreased Interest 3 0 0 0 2  Down, Depressed, Hopeless 0 0 0 0 2  PHQ - 2 Score 3 0 0 0 4  Altered sleeping 1    1  Tired, decreased energy 0    1  Change in appetite 2    3  Feeling bad or failure about yourself  0    1  Trouble concentrating 0    0  Moving slowly or fidgety/restless 3    0  Suicidal thoughts 0    0  PHQ-9 Score 9    10  Difficult  doing work/chores     Not difficult at all    The patient does not have a history of falls. I did complete a risk assessment for falls. A plan of care for falls was documented.   Past Medical History:  Past Medical History:  Diagnosis Date   Anginal pain (HCC)    Asthma    Bronchitis    CAD (coronary artery disease)    CHF (congestive heart failure) (HCC)    CVA (cerebral infarction)    Dysrhythmia    GERD (gastroesophageal reflux disease)    History of chronic atrial fibrillation    Hypercholesteremia    Hypertension    Idiopathic pulmonary hypertension (HCC)    Myocardial infarction (HCC)    Obesity    OSA (obstructive sleep apnea)    Pre-diabetes    Pulmonary artery hypertension (HCC)    Shortness of breath dyspnea    Sleep apnea    Stroke [facility])     Surgical History:  Past Surgical History:  Procedure Laterality Date   BREAST BIOPSY Right 11/15/2015   stereo, COMPLEX SCLEROSING LESION WITH INTRADUCTAL PAPILLOMA COMPONENT AND    BREAST EXCISIONAL BIOPSY Right 02/27/2016   NEG   BREAST LUMPECTOMY WITH NEEDLE LOCALIZATION Right 02/27/2016   Procedure: BREAST LUMPECTOMY WITH NEEDLE LOCALIZATION;  Surgeon: Kieth Brightly, MD;  Location: [facility] ORS;  Service: General;  Laterality: Right;   CARDIAC CATHETERIZATION     CARDIAC CATHETERIZATION Right 12/07/2014   Procedure: Right and Left Heart Cath with Coronary Angiography;  Surgeon: Dalia Heading, MD;  Location: [facility] INVASIVE CV LAB;  Service: Cardiovascular;  Laterality: Right;   CARPAL TUNNEL RELEASE  1998  and 1999   CATARACT EXTRACTION W/PHACO Right 09/24/2019   Procedure: CATARACT EXTRACTION PHACO AND INTRAOCULAR LENS PLACEMENT (IOC) RIGHT;  Surgeon: Galen Manila, MD;  Location: [facility] ORS;  Service: Ophthalmology;  Laterality: Right;  [id number] [id number]   COLONOSCOPY  10/31/1998   DILATION AND CURETTAGE OF UTERUS     FOOT SURGERY Right    HYSTEROSCOPY     JOINT REPLACEMENT Left    KNEE  ARTHROSCOPY AND ARTHROTOMY Right    lt knee replacement     TONSILLECTOMY     UVULOPALATOPHARYNGOPLASTY      Medications:  Current Outpatient Medications on File Prior to Visit  Medication Sig   ambrisentan (LETAIRIS) 10 MG tablet Take 10 mg by mouth daily.   methocarbamol (ROBAXIN) 500 MG tablet    Omega-3 Fatty Acids (FISH OIL PO) Take 1 Dose by mouth daily.   ondansetron (ZOFRAN ODT) 4 MG disintegrating tablet Take 1 tablet (4 mg total) by mouth every 8 (eight) hours as needed for nausea or vomiting.   potassium chloride (KLOR-CON) 10 MEQ tablet TAKE 1 TABLET(10 MEQ) BY MOUTH EVERY DAY   Psyllium (METAMUCIL FIBER PO) Take 1 Dose by mouth. Patient uses Metamucil Powder in the AM   VENTOLIN HFA 108 (90 Base) MCG/ACT inhaler    XYLIMELTS 550 MG DISK DISSOLVE 1 TABLET ON THE TONGUE EVERY 8 HOURS AS NEEDED FOR DRY MOUTH.   No current facility-administered medications on file prior to visit.    Allergies:  Allergies  Allergen Reactions   Ace Inhibitors Cough   Nyquil Multi-Symptom [Pseudoeph-Doxylamine-Dm-Apap] Itching   Tylenol [Acetaminophen] Itching    Social History:  Social History   Socioeconomic History   Marital status: Single    Spouse name: Not on file   Number of children: Not on file   Years of education: Not on file   Highest education level: 8th grade  Occupational History   Occupation: retired  Tobacco Use   Smoking status: Former    Packs/day: .25    Types: Cigarettes    Quit date: 06/15/2014    Years since quitting: 8.2   Smokeless tobacco: Never   Tobacco comments:    4-5 cigarettes a day when she smoked  Vaping Use   Vaping Use: Never used  Substance and Sexual Activity   Alcohol use: Yes    Alcohol/week: 0.0 - 1.0 standard drinks of alcohol    Comment: occasionally   Drug use: No   Sexual activity: Not Currently  Other Topics Concern   Not on file  Social History Narrative   Not on file   Social Determinants of Health   Financial Resource  Strain: Low Risk  (02/22/2021)   Overall Financial Resource Strain (CARDIA)    Difficulty of Paying Living Expenses: Not very hard  Food Insecurity: No Food Insecurity (02/22/2021)   Hunger Vital Sign    Worried About Running Out of Food in the Last Year: Never true    Ran Out of Food in the Last Year: Never true  Transportation Needs: No Transportation Needs (02/22/2021)   PRAPARE - Administrator, Civil Service (Medical): No    Lack of Transportation (Non-Medical): No  Physical Activity:  Inactive (02/22/2021)   Exercise Vital Sign    Days of Exercise per Week: 0 days    Minutes of Exercise per Session: 0 min  Stress: No Stress Concern Present (02/22/2021)   [employer] of Occupational Health - Occupational Stress Questionnaire    Feeling of Stress : Not at all  Social Connections: Unknown (02/22/2021)   Social Connection and Isolation Panel [NHANES]    Frequency of Communication with Friends and Family: Three times a week    Frequency of Social Gatherings with Friends and Family: Three times a week    Attends Religious Services: 1 to 4 times per year    Active Member of Clubs or Organizations: No    Attends Banker Meetings: Never    Marital Status: Not on file  Intimate Partner Violence: Not At Risk (02/22/2021)   Humiliation, Afraid, Rape, and Kick questionnaire    Fear of Current or Ex-Partner: No    Emotionally Abused: No    Physically Abused: No    Sexually Abused: No   Social History   Tobacco Use  Smoking Status Former   Packs/day: .25   Types: Cigarettes   Quit date: 06/15/2014   Years since quitting: 8.2  Smokeless Tobacco Never  Tobacco Comments   4-5 cigarettes a day when she smoked   Social History   Substance and Sexual Activity  Alcohol Use Yes   Alcohol/week: 0.0 - 1.0 standard drinks of alcohol   Comment: occasionally    Family History:  Family History  Problem Relation Age of Onset   Diabetes Mother    Alzheimer's  disease Mother    Throat cancer Brother    Diabetes Brother    Cancer Brother        lung   Hypertension Brother    Cancer Daughter 82       metastatic uterine PECOMA to the liver and lungs   Rheumatic fever Daughter    Diabetes Sister    Hypertension Sister    COPD Neg Hx    Heart disease Neg Hx    Stroke Neg Hx    Breast cancer Neg Hx     Past medical history, surgical history, medications, allergies, family history and social history reviewed with patient today and changes made to appropriate areas of the chart.   Review of Systems  HENT:         Denies vision changes.  Eyes:  Negative for blurred vision and double vision.  Respiratory:  Negative for shortness of breath.   Cardiovascular:  Negative for chest pain, palpitations and leg swelling.  Neurological:  Negative for dizziness, tingling and headaches.  Endo/Heme/Allergies:  Negative for polydipsia.       Denies Polyuria   All other ROS negative except what is listed above and in the HPI.      Objective:    BP 110/73   Pulse 63   Temp 98.3 F (36.8 C) (Oral)   Ht 5\' 4"  (1.626 m)   Wt 288 lb 4.8 oz (130.8 kg)   SpO2 92%   BMI 49.49 kg/m   Wt Readings from Last 3 Encounters:  08/30/22 288 lb 4.8 oz (130.8 kg)  07/02/22 284 lb (128.8 kg)  04/05/22 284 lb 14.4 oz (129.2 kg)    Physical Exam Vitals and nursing note reviewed.  Constitutional:      General: She is awake. She is not in acute distress.    Appearance: Normal appearance. She is well-developed. She is obese.  She is not ill-appearing.  HENT:     Head: Normocephalic and atraumatic.     Right Ear: Hearing, tympanic membrane, ear canal and external ear normal. No drainage.     Left Ear: Hearing, tympanic membrane, ear canal and external ear normal. No drainage.     Nose: Nose normal.     Right Sinus: No maxillary sinus tenderness or frontal sinus tenderness.     Left Sinus: No maxillary sinus tenderness or frontal sinus tenderness.      Mouth/Throat:     Mouth: Mucous membranes are moist.     Pharynx: Oropharynx is clear. Uvula midline. No pharyngeal swelling, oropharyngeal exudate or posterior oropharyngeal erythema.  Eyes:     General: Lids are normal.        Right eye: No discharge.        Left eye: No discharge.     Extraocular Movements: Extraocular movements intact.     Conjunctiva/sclera: Conjunctivae normal.     Pupils: Pupils are equal, round, and reactive to light.     Visual Fields: Right eye visual fields normal and left eye visual fields normal.     Comments: Left eye covered with eye patch  Neck:     Thyroid: No thyromegaly.     Vascular: No carotid bruit.     Trachea: Trachea normal.  Cardiovascular:     Rate and Rhythm: Normal rate and regular rhythm.     Heart sounds: Normal heart sounds. No murmur heard.    No gallop.  Pulmonary:     Effort: Pulmonary effort is normal. No accessory muscle usage or respiratory distress.     Breath sounds: Normal breath sounds.     Comments: On 3 L O2 Chest:  Breasts:    Right: Normal.     Left: Normal.  Abdominal:     General: Bowel sounds are normal.     Palpations: Abdomen is soft. There is no hepatomegaly or splenomegaly.     Tenderness: There is no abdominal tenderness.  Musculoskeletal:        General: Normal range of motion.     Cervical back: Normal range of motion and neck supple.     Right lower leg: No edema.     Left lower leg: No edema.  Lymphadenopathy:     Head:     Right side of head: No submental, submandibular, tonsillar, preauricular or posterior auricular adenopathy.     Left side of head: No submental, submandibular, tonsillar, preauricular or posterior auricular adenopathy.     Cervical: No cervical adenopathy.     Upper Body:     Right upper body: No supraclavicular, axillary or pectoral adenopathy.     Left upper body: No supraclavicular, axillary or pectoral adenopathy.  Skin:    General: Skin is warm and dry.     Capillary  Refill: Capillary refill takes less than 2 seconds.     Findings: No rash.  Neurological:     Mental Status: She is alert and oriented to person, place, and time.     Gait: Gait is intact.     Deep Tendon Reflexes: Reflexes are normal and symmetric.     Reflex Scores:      Brachioradialis reflexes are 2+ on the right side and 2+ on the left side.      Patellar reflexes are 2+ on the right side and 2+ on the left side. Psychiatric:        Attention and Perception: Attention normal.  Mood and Affect: Mood normal.        Speech: Speech normal.        Behavior: Behavior normal. Behavior is cooperative.        Thought Content: Thought content normal.        Judgment: Judgment normal.     Results for orders placed or performed in visit on 05/15/22  WET PREP FOR TRICH, YEAST, CLUE   Specimen: Urine   Urine  Result Value Ref Range   Trichomonas Exam Negative Negative   Yeast Exam Negative Negative   Clue Cell Exam Positive (A) Negative  Urinalysis, Routine w reflex microscopic  Result Value Ref Range   Specific Gravity, UA 1.015 1.005 - 1.030   pH, UA 5.5 5.0 - 7.5   Color, UA Yellow Yellow   Appearance Ur Clear Clear   Leukocytes,UA Negative Negative   Protein,UA Negative Negative/Trace   Glucose, UA Negative Negative   Ketones, UA Negative Negative   RBC, UA Negative Negative   Bilirubin, UA Negative Negative   Urobilinogen, Ur 0.2 0.2 - 1.0 mg/dL   Nitrite, UA Negative Negative   Microscopic Examination Comment       Assessment & Plan:   Problem List Items Addressed This Visit       Cardiovascular and Mediastinum   Primary pulmonary hypertension (HCC)    Stable at 3L.  Seeing pulmonary.  Next appt is May 21 with pulmonology.       Relevant Medications   apixaban (ELIQUIS) 5 MG TABS tablet   atorvastatin (LIPITOR) 20 MG tablet   ezetimibe (ZETIA) 10 MG tablet   furosemide (LASIX) 20 MG tablet   labetalol (NORMODYNE) 200 MG tablet    olmesartan-hydrochlorothiazide (BENICAR HCT) 20-12.5 MG tablet   Hypertension    Chronic.  Controlled.  Continue with current medication regimen of Olmesartan, HCTZ, Labetolol, and Lasix.  Refill sent.  Labs ordered today.  Return to clinic in 6 months for reevaluation.  Call sooner if concerns arise.        Relevant Medications   apixaban (ELIQUIS) 5 MG TABS tablet   atorvastatin (LIPITOR) 20 MG tablet   ezetimibe (ZETIA) 10 MG tablet   furosemide (LASIX) 20 MG tablet   labetalol (NORMODYNE) 200 MG tablet   olmesartan-hydrochlorothiazide (BENICAR HCT) 20-12.5 MG tablet   Atrial fibrillation (HCC)    Chronic.  Controlled.  Continue with current medication regimen.  Continue with Eliquis.  Continue to follow up with Cardiology.  Refills sent today.  Labs ordered today.  Return to clinic in 6 months for reevaluation.  Call sooner if concerns arise.        Relevant Medications   apixaban (ELIQUIS) 5 MG TABS tablet   atorvastatin (LIPITOR) 20 MG tablet   ezetimibe (ZETIA) 10 MG tablet   furosemide (LASIX) 20 MG tablet   labetalol (NORMODYNE) 200 MG tablet   olmesartan-hydrochlorothiazide (BENICAR HCT) 20-12.5 MG tablet   CHF (congestive heart failure) (HCC)    Chronic.  Controlled.  Continue with current medication regimen.  Continue to collaborate with Cardiology.  Labs ordered today.  Return to clinic in 6 months for reevaluation.  Call sooner if concerns arise.        Relevant Medications   apixaban (ELIQUIS) 5 MG TABS tablet   atorvastatin (LIPITOR) 20 MG tablet   ezetimibe (ZETIA) 10 MG tablet   furosemide (LASIX) 20 MG tablet   labetalol (NORMODYNE) 200 MG tablet   olmesartan-hydrochlorothiazide (BENICAR HCT) 20-12.5 MG tablet  Respiratory   Obstructive apnea    Endorses 100% use of CPAP.        Endocrine   IFG (impaired fasting glucose)    Labs ordered at visit today.  Will make recommendations based on lab results.        Relevant Orders   HgB A1c      Nervous and Auditory   Peripheral neuropathy   Relevant Medications   gabapentin (NEURONTIN) 300 MG capsule     Other   Hypercholesteremia    Chronic.  Controlled.  Continue with current medication regimen of Atorvastatin 20mg  daily.  Refills sent today.  Labs ordered today.  Return to clinic in 6 months for reevaluation.  Call sooner if concerns arise.        Relevant Medications   apixaban (ELIQUIS) 5 MG TABS tablet   atorvastatin (LIPITOR) 20 MG tablet   ezetimibe (ZETIA) 10 MG tablet   furosemide (LASIX) 20 MG tablet   labetalol (NORMODYNE) 200 MG tablet   olmesartan-hydrochlorothiazide (BENICAR HCT) 20-12.5 MG tablet   Other Relevant Orders   Lipid panel   Other Visit Diagnoses     Annual physical exam    -  Primary   Health maintenance reviewed during visit today.  Labs ordered.  Vaccines up to date.   Relevant Orders   CBC with Differential/Platelet   Comprehensive metabolic panel   Lipid panel   TSH   Urinalysis, Routine w reflex microscopic   HgB A1c        Follow up plan: Return in about 6 months (around 03/02/2023) for HTN, HLD, DM2 FU.   LABORATORY TESTING:  - Pap smear: not applicable  IMMUNIZATIONS:   - Tdap: Tetanus vaccination status reviewed: NA. - Influenza: Postponed to flu season - Pneumovax: Up to date - Prevnar: Up to date - COVID: Up to date - HPV: Not applicable - Shingrix vaccine: Up to date  SCREENING: -Mammogram: Not applicable  - Colonoscopy: Not applicable  - Bone Density: Not applicable  -Hearing Test: Not applicable  -Spirometry: Not applicable   PATIENT COUNSELING:   Advised to take 1 mg of folate supplement per day if capable of pregnancy.   Sexuality: Discussed sexually transmitted diseases, partner selection, use of condoms, avoidance of unintended pregnancy  and contraceptive alternatives.   Advised to avoid cigarette smoking.  I discussed with the patient that most people either abstain from alcohol or drink within  safe limits (<=14/week and <=4 drinks/occasion for males, <=7/weeks and <= 3 drinks/occasion for females) and that the risk for alcohol disorders and other health effects rises proportionally with the number of drinks per week and how often a drinker exceeds daily limits.  Discussed cessation/primary prevention of drug use and availability of treatment for abuse.   Diet: Encouraged to adjust caloric intake to maintain  or achieve ideal body weight, to reduce intake of dietary saturated fat and total fat, to limit sodium intake by avoiding high sodium foods and not adding table salt, and to maintain adequate dietary potassium and calcium preferably from fresh fruits, vegetables, and low-fat dairy products.    stressed the importance of regular exercise  Injury prevention: Discussed safety belts, safety helmets, smoke detector, smoking near bedding or upholstery.   Dental health: Discussed importance of regular tooth brushing, flossing, and dental visits.    NEXT PREVENTATIVE PHYSICAL DUE IN 1 YEAR. Return in about 6 months (around 03/02/2023) for HTN, HLD, DM2 FU.

## 2022-08-30 NOTE — Assessment & Plan Note (Signed)
Stable at 3L.  Seeing pulmonary.  Next appt is May 21 with pulmonology.

## 2022-08-30 NOTE — Assessment & Plan Note (Signed)
Chronic.  Controlled.  Continue with current medication regimen.  Continue to collaborate with Cardiology.  Labs ordered today.  Return to clinic in 6 months for reevaluation.  Call sooner if concerns arise.

## 2022-08-30 NOTE — Assessment & Plan Note (Signed)
Chronic.  Controlled.  Continue with current medication regimen of Olmesartan, HCTZ, Labetolol, and Lasix.  Refill sent.  Labs ordered today.  Return to clinic in 6 months for reevaluation.  Call sooner if concerns arise.

## 2022-08-30 NOTE — Assessment & Plan Note (Signed)
Chronic.  Controlled.  Continue with current medication regimen.  Continue with Eliquis.  Continue to follow up with Cardiology.  Refills sent today.  Labs ordered today.  Return to clinic in 6 months for reevaluation.  Call sooner if concerns arise.

## 2022-08-30 NOTE — Assessment & Plan Note (Signed)
Labs ordered at visit today.  Will make recommendations based on lab results.   

## 2022-08-30 NOTE — Assessment & Plan Note (Signed)
Endorses 100% use of CPAP. 

## 2022-08-31 ENCOUNTER — Telehealth: Payer: Self-pay

## 2022-08-31 LAB — HEMOGLOBIN A1C
Est. average glucose Bld gHb Est-mCnc: 128 mg/dL
Hgb A1c MFr Bld: 6.1 % — ABNORMAL HIGH (ref 4.8–5.6)

## 2022-08-31 LAB — COMPREHENSIVE METABOLIC PANEL
ALT: 11 IU/L (ref 0–32)
AST: 17 IU/L (ref 0–40)
Albumin/Globulin Ratio: 1.8 (ref 1.2–2.2)
Albumin: 4.2 g/dL (ref 3.8–4.8)
Alkaline Phosphatase: 65 IU/L (ref 44–121)
BUN/Creatinine Ratio: 17 (ref 12–28)
BUN: 18 mg/dL (ref 8–27)
Bilirubin Total: 0.6 mg/dL (ref 0.0–1.2)
CO2: 28 mmol/L (ref 20–29)
Calcium: 9.3 mg/dL (ref 8.7–10.3)
Chloride: 103 mmol/L (ref 96–106)
Creatinine, Ser: 1.05 mg/dL — ABNORMAL HIGH (ref 0.57–1.00)
Globulin, Total: 2.3 g/dL (ref 1.5–4.5)
Glucose: 102 mg/dL — ABNORMAL HIGH (ref 70–99)
Potassium: 3.2 mmol/L — ABNORMAL LOW (ref 3.5–5.2)
Sodium: 145 mmol/L — ABNORMAL HIGH (ref 134–144)
Total Protein: 6.5 g/dL (ref 6.0–8.5)
eGFR: 54 mL/min/{1.73_m2} — ABNORMAL LOW (ref >59)

## 2022-08-31 LAB — CBC WITH DIFFERENTIAL/PLATELET
Basophils Absolute: 0 10*3/uL (ref 0.0–0.2)
Basos: 0 %
EOS (ABSOLUTE): 0.2 10*3/uL (ref 0.0–0.4)
Eos: 4 %
Hematocrit: 33 % — ABNORMAL LOW (ref 34.0–46.6)
Hemoglobin: 10.6 g/dL — ABNORMAL LOW (ref 11.1–15.9)
Immature Grans (Abs): 0 10*3/uL (ref 0.0–0.1)
Immature Granulocytes: 0 %
Lymphocytes Absolute: 1.9 10*3/uL (ref 0.7–3.1)
Lymphs: 42 %
MCH: 31.3 pg (ref 26.6–33.0)
MCHC: 32.1 g/dL (ref 31.5–35.7)
MCV: 97 fL (ref 79–97)
Monocytes Absolute: 0.5 10*3/uL (ref 0.1–0.9)
Monocytes: 10 %
Neutrophils Absolute: 2 10*3/uL (ref 1.4–7.0)
Neutrophils: 44 %
Platelets: 165 10*3/uL (ref 150–450)
RBC: 3.39 x10E6/uL — ABNORMAL LOW (ref 3.77–5.28)
RDW: 13.5 % (ref 11.7–15.4)
WBC: 4.5 10*3/uL (ref 3.4–10.8)

## 2022-08-31 LAB — LIPID PANEL
Chol/HDL Ratio: 1.8 ratio (ref 0.0–4.4)
Cholesterol, Total: 159 mg/dL (ref 100–199)
HDL: 87 mg/dL (ref >39)
LDL Chol Calc (NIH): 62 mg/dL (ref 0–99)
Triglycerides: 47 mg/dL (ref 0–149)
VLDL Cholesterol Cal: 10 mg/dL (ref 5–40)

## 2022-08-31 LAB — TSH: TSH: 2.61 u[IU]/mL (ref 0.450–4.500)

## 2022-08-31 NOTE — Progress Notes (Signed)
Please let patient know that overall her lab work looks good.  She does have some anemia.  I would like her to come back in 1 month and repeat this to make sure it isn't worsening.  Her potassium is also low.  I recommend increasing her potassium intake with bananas, potatoes and citrus.  We will also repeat this in 1 month.    Cholesterol looks good.  Her A1c is well controlled at 6.1%.

## 2022-08-31 NOTE — Telephone Encounter (Signed)
Pt given lab results per notes of Clydie Braun, NP on 08/31/22. Pt verbalized understanding.    Michelle Grooms, NP 08/31/2022  8:11 AM EDT     Please let patient know that overall her lab work looks good.  She does have some anemia.  I would like her to come back in 1 month and repeat this to make sure it isn't worsening.  Her potassium is also low.  I recommend increasing her potassium intake with bananas, potatoes and citrus.  We will also repeat this in 1 month.     Cholesterol looks good.  Her A1c is well controlled at 6.1%.

## 2022-08-31 NOTE — Addendum Note (Signed)
Addended by: Larae Grooms on: 08/31/2022 08:11 AM   Modules accepted: Orders

## 2022-10-02 ENCOUNTER — Other Ambulatory Visit: Payer: 59

## 2022-10-02 DIAGNOSIS — I10 Essential (primary) hypertension: Secondary | ICD-10-CM

## 2022-10-02 DIAGNOSIS — D649 Anemia, unspecified: Secondary | ICD-10-CM

## 2022-10-03 LAB — COMPREHENSIVE METABOLIC PANEL
ALT: 10 IU/L (ref 0–32)
AST: 16 IU/L (ref 0–40)
Albumin: 4.5 g/dL (ref 3.8–4.8)
Alkaline Phosphatase: 69 IU/L (ref 44–121)
BUN/Creatinine Ratio: 22 (ref 12–28)
BUN: 28 mg/dL — ABNORMAL HIGH (ref 8–27)
Bilirubin Total: 0.4 mg/dL (ref 0.0–1.2)
CO2: 27 mmol/L (ref 20–29)
Calcium: 9.2 mg/dL (ref 8.7–10.3)
Chloride: 100 mmol/L (ref 96–106)
Creatinine, Ser: 1.27 mg/dL — ABNORMAL HIGH (ref 0.57–1.00)
Globulin, Total: 2.1 g/dL (ref 1.5–4.5)
Glucose: 91 mg/dL (ref 70–99)
Potassium: 4.3 mmol/L (ref 3.5–5.2)
Sodium: 140 mmol/L (ref 134–144)
Total Protein: 6.6 g/dL (ref 6.0–8.5)
eGFR: 43 mL/min/{1.73_m2} — ABNORMAL LOW (ref >59)

## 2022-10-03 LAB — ANEMIA PROFILE B
Basophils Absolute: 0 10*3/uL (ref 0.0–0.2)
Basos: 1 %
EOS (ABSOLUTE): 0.3 10*3/uL (ref 0.0–0.4)
Eos: 5 %
Ferritin: 91 ng/mL (ref 15–150)
Folate: 12.5 ng/mL (ref >3.0)
Hematocrit: 32.9 % — ABNORMAL LOW (ref 34.0–46.6)
Hemoglobin: 10.8 g/dL — ABNORMAL LOW (ref 11.1–15.9)
Immature Grans (Abs): 0 10*3/uL (ref 0.0–0.1)
Immature Granulocytes: 0 %
Iron Saturation: 29 % (ref 15–55)
Iron: 72 ug/dL (ref 27–139)
Lymphocytes Absolute: 2.4 10*3/uL (ref 0.7–3.1)
Lymphs: 44 %
MCH: 31.7 pg (ref 26.6–33.0)
MCHC: 32.8 g/dL (ref 31.5–35.7)
MCV: 97 fL (ref 79–97)
Monocytes Absolute: 0.5 10*3/uL (ref 0.1–0.9)
Monocytes: 9 %
Neutrophils Absolute: 2.3 10*3/uL (ref 1.4–7.0)
Neutrophils: 41 %
Platelets: 157 10*3/uL (ref 150–450)
RBC: 3.41 x10E6/uL — ABNORMAL LOW (ref 3.77–5.28)
RDW: 13.3 % (ref 11.7–15.4)
Retic Ct Pct: 1.3 % (ref 0.6–2.6)
Total Iron Binding Capacity: 246 ug/dL — ABNORMAL LOW (ref 250–450)
UIBC: 174 ug/dL (ref 118–369)
Vitamin B-12: 556 pg/mL (ref 232–1245)
WBC: 5.6 10*3/uL (ref 3.4–10.8)

## 2022-10-03 NOTE — Progress Notes (Signed)
Please let patient know that her anemia has improved from prior.  Her potassium also improved.  I recommend increasing water intake to help make sure she is well hydrated.  Otherwise, lab work looks good.

## 2022-11-21 ENCOUNTER — Encounter: Payer: Self-pay | Admitting: Family Medicine

## 2022-11-21 ENCOUNTER — Ambulatory Visit (INDEPENDENT_AMBULATORY_CARE_PROVIDER_SITE_OTHER): Payer: 59 | Admitting: Family Medicine

## 2022-11-21 VITALS — BP 114/73 | HR 81 | Temp 98.6°F | Wt 283.4 lb

## 2022-11-21 DIAGNOSIS — M25561 Pain in right knee: Secondary | ICD-10-CM

## 2022-11-21 MED ORDER — PREDNISONE 10 MG PO TABS: ORAL_TABLET | ORAL | 0 refills | Status: DC

## 2022-11-21 MED ORDER — TRIAMCINOLONE ACETONIDE 40 MG/ML IJ SUSP
40.0000 mg | Freq: Once | INTRAMUSCULAR | Status: AC
  Administered 2022-11-21: 40 mg via INTRAMUSCULAR

## 2022-11-21 NOTE — Progress Notes (Unsigned)
BP 114/73   Pulse 81   Temp 98.6 F (37 C) (Oral)   Wt 283 lb 6.4 oz (128.5 kg)   SpO2 96%   BMI 48.65 kg/m    Subjective:    Patient ID: Michelle Jenkins, female    DOB: 26-Mar-1946, 77 y.o.   MRN: [id number]  HPI: Michelle Jenkins is a 77 y.o. female  Chief Complaint  Patient presents with  . Knee Pain    Pt states she has been having R knee pain for a while. States the pain has gotten worse over the last couple of days. States she mainly feels the pain when she tries to walk, states she knows she has bone on bone in that knee.    KNEE PAIN Duration: chronic, but got a lot worse yesterday Involved knee: right Mechanism of injury: unknown Location:diffuse Onset: sudden Severity: severe  Quality:  tender  Frequency: with movement Radiation: no Aggravating factors: weight bearing, walking, and movement  Alleviating factors: rest  Status: stable Treatments attempted: rest  Relief with NSAIDs?:  No NSAIDs Taken Weakness with weight bearing or walking: yes Sensation of giving way: yes Locking: no Popping: no Bruising: no Swelling: no Redness: no Paresthesias/decreased sensation: no Fevers: no  Relevant past medical, surgical, family and social history reviewed and updated as indicated. Interim medical history since our last visit reviewed. Allergies and medications reviewed and updated.  Review of Systems  Per HPI unless specifically indicated above     Objective:    BP 114/73   Pulse 81   Temp 98.6 F (37 C) (Oral)   Wt 283 lb 6.4 oz (128.5 kg)   SpO2 96%   BMI 48.65 kg/m   Wt Readings from Last 3 Encounters:  11/21/22 283 lb 6.4 oz (128.5 kg)  08/30/22 288 lb 4.8 oz (130.8 kg)  07/02/22 284 lb (128.8 kg)    Physical Exam Vitals and nursing note reviewed.  Constitutional:      General: She is not in acute distress.    Appearance: Normal appearance. She is obese. She is not ill-appearing, toxic-appearing or diaphoretic.  HENT:     Head:  Normocephalic and atraumatic.     Right Ear: External ear normal.     Left Ear: External ear normal.     Nose: Nose normal.     Mouth/Throat:     Mouth: Mucous membranes are moist.     Pharynx: Oropharynx is clear.  Eyes:     General: No scleral icterus.       Right eye: No discharge.        Left eye: No discharge.     Extraocular Movements: Extraocular movements intact.     Conjunctiva/sclera: Conjunctivae normal.     Pupils: Pupils are equal, round, and reactive to light.  Cardiovascular:     Rate and Rhythm: Normal rate and regular rhythm.     Pulses: Normal pulses.     Heart sounds: Normal heart sounds. No murmur heard.    No friction rub. No gallop.  Pulmonary:     Effort: Pulmonary effort is normal. No respiratory distress.     Breath sounds: Normal breath sounds. No stridor. No wheezing, rhonchi or rales.  Chest:     Chest wall: No tenderness.  Musculoskeletal:        General: Tenderness (R knee) present.     Cervical back: Normal range of motion and neck supple.  Skin:    General: Skin is warm and dry.  Capillary Refill: Capillary refill takes less than 2 seconds.     Coloration: Skin is not jaundiced or pale.     Findings: No bruising, erythema, lesion or rash.  Neurological:     General: No focal deficit present.     Mental Status: She is alert and oriented to person, place, and time. Mental status is at baseline.  Psychiatric:        Mood and Affect: Mood normal.        Behavior: Behavior normal.        Thought Content: Thought content normal.        Judgment: Judgment normal.    Results for orders placed or performed in visit on 10/02/22  Comp Met (CMET)  Result Value Ref Range   Glucose 91 70 - 99 mg/dL   BUN 28 (H) 8 - 27 mg/dL   Creatinine, Ser 4.09 (H) 0.57 - 1.00 mg/dL   eGFR 43 (L) >81 XB/JYN/8.29   BUN/Creatinine Ratio 22 12 - 28   Sodium 140 134 - 144 mmol/L   Potassium 4.3 3.5 - 5.2 mmol/L   Chloride 100 96 - 106 mmol/L   CO2 27 20 - 29  mmol/L   Calcium 9.2 8.7 - 10.3 mg/dL   Total Protein 6.6 6.0 - 8.5 g/dL   Albumin 4.5 3.8 - 4.8 g/dL   Globulin, Total 2.1 1.5 - 4.5 g/dL   Bilirubin Total 0.4 0.0 - 1.2 mg/dL   Alkaline Phosphatase 69 44 - 121 IU/L   AST 16 0 - 40 IU/L   ALT 10 0 - 32 IU/L  Anemia Profile B  Result Value Ref Range   Total Iron Binding Capacity 246 (L) 250 - 450 ug/dL   UIBC 562 130 - 865 ug/dL   Iron 72 27 - 784 ug/dL   Iron Saturation 29 15 - 55 %   Ferritin 91 15 - 150 ng/mL   Vitamin B-12 556 232 - 1,245 pg/mL   Folate 12.5 >3.0 ng/mL   WBC 5.6 3.4 - 10.8 x10E3/uL   RBC 3.41 (L) 3.77 - 5.28 x10E6/uL   Hemoglobin 10.8 (L) 11.1 - 15.9 g/dL   Hematocrit 69.6 (L) 29.5 - 46.6 %   MCV 97 79 - 97 fL   MCH 31.7 26.6 - 33.0 pg   MCHC 32.8 31.5 - 35.7 g/dL   RDW 28.4 13.2 - 44.0 %   Platelets 157 150 - 450 x10E3/uL   Neutrophils 41 Not Estab. %   Lymphs 44 Not Estab. %   Monocytes 9 Not Estab. %   Eos 5 Not Estab. %   Basos 1 Not Estab. %   Neutrophils Absolute 2.3 1.4 - 7.0 x10E3/uL   Lymphocytes Absolute 2.4 0.7 - 3.1 x10E3/uL   Monocytes Absolute 0.5 0.1 - 0.9 x10E3/uL   EOS (ABSOLUTE) 0.3 0.0 - 0.4 x10E3/uL   Basophils Absolute 0.0 0.0 - 0.2 x10E3/uL   Immature Granulocytes 0 Not Estab. %   Immature Grans (Abs) 0.0 0.0 - 0.1 x10E3/uL   Retic Ct Pct 1.3 0.6 - 2.6 %      Assessment & Plan:   Problem List Items Addressed This Visit   None    Follow up plan: No follow-ups on file.

## 2022-11-28 ENCOUNTER — Ambulatory Visit
Admission: RE | Admit: 2022-11-28 | Discharge: 2022-11-28 | Disposition: A | Discharge status: Home / Self Care | Payer: 59 | Attending: Family Medicine | Admitting: Family Medicine

## 2022-11-28 ENCOUNTER — Ambulatory Visit
Admission: RE | Admit: 2022-11-28 | Discharge: 2022-11-28 | Disposition: A | Discharge status: Home / Self Care | Payer: 59 | Source: Ambulatory Visit | Attending: Family Medicine | Admitting: Family Medicine

## 2022-11-28 DIAGNOSIS — M25561 Pain in right knee: Secondary | ICD-10-CM | POA: Diagnosis not present

## 2022-12-07 ENCOUNTER — Encounter: Payer: Self-pay | Admitting: Family Medicine

## 2022-12-07 ENCOUNTER — Ambulatory Visit (INDEPENDENT_AMBULATORY_CARE_PROVIDER_SITE_OTHER): Payer: 59 | Admitting: Family Medicine

## 2022-12-07 VITALS — BP 118/77 | HR 68 | Ht 64.0 in | Wt 282.0 lb

## 2022-12-07 DIAGNOSIS — M25561 Pain in right knee: Secondary | ICD-10-CM | POA: Diagnosis not present

## 2022-12-07 DIAGNOSIS — N950 Postmenopausal bleeding: Secondary | ICD-10-CM | POA: Diagnosis not present

## 2022-12-07 NOTE — Assessment & Plan Note (Signed)
Will get her set up for transvaginal ultrasound. Await results.

## 2022-12-07 NOTE — Progress Notes (Signed)
BP 118/77   Pulse 68   Ht 5\' 4"  (1.626 m)   Wt 282 lb (127.9 kg)   SpO2 98% Comment: 3L P  BMI 48.41 kg/m    Subjective:    Patient ID: Michelle Jenkins, female    DOB: Oct 03, 1945, 77 y.o.   MRN: [id number]  HPI: Michelle Jenkins is a 77 y.o. female  Chief Complaint  Patient presents with   R knee pain   KNEE PAIN Duration: about 2 weeks Involved knee: right Mechanism of injury: unknown Location:diffuse Onset: sudden Severity: severe  Quality:  aching and sore Frequency: intermittent Radiation: no Aggravating factors: walking, movement  Alleviating factors: steroids  Status: better Treatments attempted: steroids  Relief with NSAIDs?:  No NSAIDs Taken Weakness with weight bearing or walking: no Sensation of giving way: no Locking: no Popping: no Bruising: no Swelling: no Redness: no Paresthesias/decreased sensation: no Fevers: no  POST-MENOPAUSAL BLEEDING Duration: 6 days Flow: spotting only Dysmenorrhea: no History of sexually transmitted diseases: no History GYN procedures: no Abnormal pap smears: no   Dyspareunia: no Vaginal discharge:no Abdominal pain: no Galactorrhea: no Hirsuitism: no Frequent bruising/mucosal bleeding: no Double vision:no Hot flashes: no   Relevant past medical, surgical, family and social history reviewed and updated as indicated. Interim medical history since our last visit reviewed. Allergies and medications reviewed and updated.  Review of Systems  Constitutional: Negative.   Respiratory: Negative.    Cardiovascular: Negative.   Gastrointestinal: Negative.   Genitourinary:  Positive for vaginal bleeding. Negative for decreased urine volume, difficulty urinating, dyspareunia, dysuria, enuresis, flank pain, frequency, genital sores, hematuria, menstrual problem, pelvic pain, urgency, vaginal discharge and vaginal pain.  Musculoskeletal:  Positive for arthralgias. Negative for back pain, gait problem, joint swelling,  myalgias, neck pain and neck stiffness.  Skin: Negative.   Neurological: Negative.   Psychiatric/Behavioral: Negative.      Per HPI unless specifically indicated above     Objective:    BP 118/77   Pulse 68   Ht 5\' 4"  (1.626 m)   Wt 282 lb (127.9 kg)   SpO2 98% Comment: 3L P  BMI 48.41 kg/m   Wt Readings from Last 3 Encounters:  12/07/22 282 lb (127.9 kg)  11/21/22 283 lb 6.4 oz (128.5 kg)  08/30/22 288 lb 4.8 oz (130.8 kg)    Physical Exam Vitals and nursing note reviewed.  Constitutional:      General: She is not in acute distress.    Appearance: Normal appearance. She is obese. She is not ill-appearing, toxic-appearing or diaphoretic.  HENT:     Head: Normocephalic and atraumatic.     Right Ear: External ear normal.     Left Ear: External ear normal.     Nose: Nose normal.     Mouth/Throat:     Mouth: Mucous membranes are moist.     Pharynx: Oropharynx is clear.  Eyes:     General: No scleral icterus.       Right eye: No discharge.        Left eye: No discharge.     Extraocular Movements: Extraocular movements intact.     Conjunctiva/sclera: Conjunctivae normal.     Pupils: Pupils are equal, round, and reactive to light.  Cardiovascular:     Rate and Rhythm: Normal rate and regular rhythm.     Pulses: Normal pulses.     Heart sounds: Normal heart sounds. No murmur heard.    No friction rub. No gallop.  Pulmonary:  Effort: Pulmonary effort is normal. No respiratory distress.     Breath sounds: Normal breath sounds. No stridor. No wheezing, rhonchi or rales.  Chest:     Chest wall: No tenderness.  Musculoskeletal:        General: Tenderness (R knee) present.     Cervical back: Normal range of motion and neck supple.  Skin:    General: Skin is warm and dry.     Capillary Refill: Capillary refill takes less than 2 seconds.     Coloration: Skin is not jaundiced or pale.     Findings: No bruising, erythema, lesion or rash.  Neurological:     General: No  focal deficit present.     Mental Status: She is alert and oriented to person, place, and time. Mental status is at baseline.  Psychiatric:        Mood and Affect: Mood normal.        Behavior: Behavior normal.        Thought Content: Thought content normal.        Judgment: Judgment normal.     Results for orders placed or performed in visit on 10/02/22  Comp Met (CMET)  Result Value Ref Range   Glucose 91 70 - 99 mg/dL   BUN 28 (H) 8 - 27 mg/dL   Creatinine, Ser 0.96 (H) 0.57 - 1.00 mg/dL   eGFR 43 (L) >04 VW/UJW/1.19   BUN/Creatinine Ratio 22 12 - 28   Sodium 140 134 - 144 mmol/L   Potassium 4.3 3.5 - 5.2 mmol/L   Chloride 100 96 - 106 mmol/L   CO2 27 20 - 29 mmol/L   Calcium 9.2 8.7 - 10.3 mg/dL   Total Protein 6.6 6.0 - 8.5 g/dL   Albumin 4.5 3.8 - 4.8 g/dL   Globulin, Total 2.1 1.5 - 4.5 g/dL   Bilirubin Total 0.4 0.0 - 1.2 mg/dL   Alkaline Phosphatase 69 44 - 121 IU/L   AST 16 0 - 40 IU/L   ALT 10 0 - 32 IU/L  Anemia Profile B  Result Value Ref Range   Total Iron Binding Capacity 246 (L) 250 - 450 ug/dL   UIBC 147 829 - 562 ug/dL   Iron 72 27 - 130 ug/dL   Iron Saturation 29 15 - 55 %   Ferritin 91 15 - 150 ng/mL   Vitamin B-12 556 232 - 1,245 pg/mL   Folate 12.5 >3.0 ng/mL   WBC 5.6 3.4 - 10.8 x10E3/uL   RBC 3.41 (L) 3.77 - 5.28 x10E6/uL   Hemoglobin 10.8 (L) 11.1 - 15.9 g/dL   Hematocrit 86.5 (L) 78.4 - 46.6 %   MCV 97 79 - 97 fL   MCH 31.7 26.6 - 33.0 pg   MCHC 32.8 31.5 - 35.7 g/dL   RDW 69.6 29.5 - 28.4 %   Platelets 157 150 - 450 x10E3/uL   Neutrophils 41 Not Estab. %   Lymphs 44 Not Estab. %   Monocytes 9 Not Estab. %   Eos 5 Not Estab. %   Basos 1 Not Estab. %   Neutrophils Absolute 2.3 1.4 - 7.0 x10E3/uL   Lymphocytes Absolute 2.4 0.7 - 3.1 x10E3/uL   Monocytes Absolute 0.5 0.1 - 0.9 x10E3/uL   EOS (ABSOLUTE) 0.3 0.0 - 0.4 x10E3/uL   Basophils Absolute 0.0 0.0 - 0.2 x10E3/uL   Immature Granulocytes 0 Not Estab. %   Immature Grans (Abs) 0.0  0.0 - 0.1 x10E3/uL   Retic Ct Pct 1.3  0.6 - 2.6 %      Assessment & Plan:   Problem List Items Addressed This Visit       Other   PMB (postmenopausal bleeding)    Will get her set up for transvaginal ultrasound. Await results.       Relevant Orders   US Pelvic Complete With Transvaginal   Other Visit Diagnoses     Acute pain of right knee    -  Primary   Significantly better after steroid. Will give steroid injection in knee today as below. Call with any concerns.      Procedure: Right  Knee Intraarticular Steroid Injection        Diagnosis:   ICD-10-CM   1. Acute pain of right knee  M25.561    Significantly better after steroid. Will give steroid injection in knee today as below. Call with any concerns.    2. PMB (postmenopausal bleeding)  N95.0 US Pelvic Complete With Transvaginal      Physician: MPJ Consent:  Risks, benefits, and alternative treatments discussed and all questions were answered.  Patient elected to proceed and verbal consent obtained.  Description: Area prepped and draped using  semi-sterile technique.  Using a anterior/lateral approach, a mixture of 4 cc of  1% lidocaine & 1 cc of Kenalog 40 was injected into knee joint.  A bandage was then placed over the injection site. Complications: none Post Procedure Instructions: Wound care instructions discussed and patient was instructed to keep area clean and dry.  Signs and symptoms of infection discussed, patient agrees to contact the office ASAP should they occur.  Follow Up: Return As scheduled with Clydie Braun.   Follow up plan: Return As scheduled with Clydie Braun.

## 2022-12-21 ENCOUNTER — Ambulatory Visit
Admission: RE | Admit: 2022-12-21 | Discharge: 2022-12-21 | Disposition: A | Discharge status: Home / Self Care | Payer: 59 | Source: Ambulatory Visit | Attending: Family Medicine | Admitting: Family Medicine

## 2022-12-21 DIAGNOSIS — N950 Postmenopausal bleeding: Secondary | ICD-10-CM | POA: Insufficient documentation

## 2023-01-06 ENCOUNTER — Other Ambulatory Visit: Payer: Self-pay | Admitting: Family Medicine

## 2023-01-06 DIAGNOSIS — N950 Postmenopausal bleeding: Secondary | ICD-10-CM

## 2023-01-16 ENCOUNTER — Telehealth: Payer: Self-pay | Admitting: Obstetrics

## 2023-01-16 NOTE — Telephone Encounter (Signed)
Per ABC. The patient needs to see MD only for this appointment. She was scheduled for Thursday, 10/3 at 9:15 am with Helmut Muster Copland. The patient's appointment has changed to Thursday, 10/3 at 9:35 am with Dr. Julieanne Manson.  I left generic message asking the patient to contact our office due to scheduling change to confirm new provider and time.

## 2023-01-16 NOTE — Telephone Encounter (Signed)
The patient contacted the office and confirmed scheduling changes

## 2023-01-17 ENCOUNTER — Other Ambulatory Visit (HOSPITAL_COMMUNITY)
Admission: RE | Admit: 2023-01-17 | Discharge: 2023-01-17 | Disposition: A | Discharge status: Home / Self Care | Payer: 59 | Source: Ambulatory Visit | Attending: Obstetrics and Gynecology | Admitting: Obstetrics and Gynecology

## 2023-01-17 ENCOUNTER — Encounter: Payer: Self-pay | Admitting: Obstetrics

## 2023-01-17 ENCOUNTER — Ambulatory Visit: Payer: 59 | Admitting: Obstetrics

## 2023-01-17 ENCOUNTER — Encounter: Payer: 59 | Admitting: Obstetrics and Gynecology

## 2023-01-17 VITALS — BP 152/65 | HR 71 | Ht 65.0 in | Wt 285.0 lb

## 2023-01-17 DIAGNOSIS — R87613 High grade squamous intraepithelial lesion on cytologic smear of cervix (HGSIL): Secondary | ICD-10-CM

## 2023-01-17 DIAGNOSIS — D219 Benign neoplasm of connective and other soft tissue, unspecified: Secondary | ICD-10-CM

## 2023-01-17 DIAGNOSIS — N950 Postmenopausal bleeding: Secondary | ICD-10-CM | POA: Insufficient documentation

## 2023-01-17 NOTE — Progress Notes (Signed)
   GYNECOLOGY PROGRESS NOTE  Subjective:    Patient ID: Michelle Jenkins, female    DOB: Aug 23, 1945, 77 y.o.   MRN: [id number]  HPI  Patient is a 77 y.o. [id number] who was referred to [place] today by her PCP, Dr. Olevia Perches, for post menopausal bleeding, needs endometrial biopsy. She reports she had some spotting for about 6 days. This was the first episode for her. Is not currently on HRT. TVUS obtained on 12/21/22 and showed EMT of 5mm, 3 discrete fibroids, and a cystic structure in the canal warranting the sampling today.   Of note, pt's BP was 152/65 and has known HTN, did take her BP medications today and denies headache, vision changes, dizziness, palpitations.   The following portions of the patient's history were reviewed and updated as appropriate: allergies, current medications, past family history, past medical history, past social history, past surgical history, and problem list.  Review of Systems Pertinent items are noted in HPI.   Objective:   Blood pressure (!) 152/65, pulse 71, height 5\' 5"  (1.651 m), weight 285 lb (129.3 kg). Body mass index is 47.43 kg/m.  General appearance: alert, cooperative, and morbidly obese Respiratory: on Oxygen, normal work of breathing Abdomen:  soft, non-tender, large pannus Pelvic:  vulva normal, vaginal mucosa with mild atrophy, cervix normal appearing and mid position. No lesions, growths.  Extremities: extremities normal, atraumatic, no cyanosis or edema Neurologic: Grossly normal  TVUS 12/21/22 IMPRESSION: 1. Endometrium measures 5 mm. Small cystic area noted in the endometrial canal, nonspecific. In the setting of post-menopausal bleeding, endometrial sampling is indicated to exclude carcinoma. If results are benign, sonohysterogram should be considered for focal lesion work-up. (Ref: Radiological Reasoning: Algorithmic Workup of Abnormal Vaginal Bleeding with Endovaginal Sonography and Sonohysterography. AJR 2008; 607:P71-06) 2. Three discrete  uterine fibroids, 1 being submucosal possibly the source of postmenopausal bleeding. 3. Neither ovary visualized.  No adnexal masses.  Endometrial Biopsy Procedure Note  The patient is positioned on the exam table in the dorsal lithotomy position. Bimanual exam confirms uterine position and size. A Graves speculum is placed into the vagina. A single toothed tenaculum is placed onto the anterior lip of the cervix. Dilators were used on the stenosed cervix. The pipette was placed into the endocervical canal and advanced to the uterine fundus. Using a piston like technique, with vacuum created by withdrawing the stylus, the endometrial specimen is obtained and transferred to the biopsy container for a total of 2 passes. Minimal bleeding is encountered. The procedure is well tolerated.   Uterine Position:mid    Uterine Length: 8cm   Uterine Specimen: Scant   Assessment:   1. PMB (postmenopausal bleeding)   2. Fibroids    Plan:  26RS [id number] with first episode of PMB, completed EMB as above. Discussed home care and return precautions. RTC in 2 weeks for results visit, sooner prn.   Julieanne Manson, DO [facility]

## 2023-01-21 LAB — SURGICAL PATHOLOGY

## 2023-01-24 ENCOUNTER — Telehealth: Payer: Self-pay | Admitting: Obstetrics

## 2023-01-24 NOTE — Telephone Encounter (Signed)
-----   Message from [facility] Jessica P sent at 01/24/2023  1:45 PM EDT ----- Called pt and left voice message to return my call. ----- Message ----- From: Julieanne Manson, MD Sent: 01/24/2023  12:20 PM EDT To: Cornelius Moras, CMA  Please let pt know her biopsy came back and there was no evidence of cancer. Results were discussed first with Dr. Valentino Saxon. No further follow up required at this time.

## 2023-01-25 NOTE — Progress Notes (Signed)
Called pt to discuss results, no answer, left voice message to return call

## 2023-01-28 ENCOUNTER — Other Ambulatory Visit: Payer: Self-pay | Admitting: Nurse Practitioner

## 2023-01-29 NOTE — Telephone Encounter (Signed)
Requested Prescriptions  Pending Prescriptions Disp Refills   pantoprazole (PROTONIX) 40 MG tablet [Pharmacy Med Name: PANTOPRAZOLE 40MG  TABLETS] 180 tablet 1    Sig: TAKE 1 TABLET BY MOUTH EVERY DAY     Gastroenterology: Proton Pump Inhibitors Passed - 01/28/2023  7:08 AM      Passed - Valid encounter within last 12 months    Recent Outpatient Visits           1 month ago Acute pain of right knee   [place] [facility], Megan P, DO   2 months ago Acute pain of right knee   [place] [facility] [place], Megan P, DO   5 months ago Annual physical exam   [place] [facility] Larae Grooms, NP   8 months ago Bacterial vaginosis   [place] [facility] Larae Grooms, NP   9 months ago Acute non-recurrent frontal sinusitis   [place] [facility] Larae Grooms, NP       Future Appointments             In 1 month Larae Grooms, NP  [facility]

## 2023-01-31 ENCOUNTER — Telehealth: Payer: Self-pay

## 2023-01-31 NOTE — Telephone Encounter (Signed)
Called pt to advise of results, no answer and no voice mailbox set up

## 2023-02-08 ENCOUNTER — Other Ambulatory Visit: Payer: Self-pay | Admitting: Nurse Practitioner

## 2023-02-08 NOTE — Telephone Encounter (Signed)
Requested Prescriptions  Pending Prescriptions Disp Refills   labetalol (NORMODYNE) 200 MG tablet [Pharmacy Med Name: LABETALOL 200MG  TABLETS] 180 tablet 1    Sig: TAKE 1 TABLET BY MOUTH TWICE DAILY     Cardiovascular:  Beta Blockers Failed - 02/08/2023  7:07 AM      Failed - Last BP in normal range    BP Readings from Last 1 Encounters:  01/17/23 (!) 152/65         Passed - Last Heart Rate in normal range    Pulse Readings from Last 1 Encounters:  01/17/23 71         Passed - Valid encounter within last 6 months    Recent Outpatient Visits           2 months ago Acute pain of right knee   [place] [facility] [place], Megan P, DO   2 months ago Acute pain of right knee   [place] [facility] [place], Megan P, DO   5 months ago Annual physical exam   [place] [facility] Larae Grooms, NP   8 months ago Bacterial vaginosis   [place] [facility] Larae Grooms, NP   10 months ago Acute non-recurrent frontal sinusitis   [place] [facility] Larae Grooms, NP       Future Appointments             In 3 weeks Larae Grooms, NP [place] [facility]

## 2023-02-26 ENCOUNTER — Telehealth: Payer: Self-pay | Admitting: Nurse Practitioner

## 2023-02-26 NOTE — Telephone Encounter (Signed)
Copied from CRM [id number]. Topic: Medicare AWV >> Feb 26, 2023 12:59 PM Payton Doughty wrote: Reason for CRM: Called 02/26/2023 to sched Annual Wellness Visit - NO VOICEMAIL  Verlee Rossetti; Care Guide Ambulatory Clinical Support [place] [facility] Direct Dial: [phone]

## 2023-03-04 ENCOUNTER — Encounter: Payer: Self-pay | Admitting: Nurse Practitioner

## 2023-03-04 ENCOUNTER — Ambulatory Visit: Payer: 59 | Admitting: Nurse Practitioner

## 2023-03-04 VITALS — BP 114/78 | HR 63 | Temp 97.8°F | Ht 65.0 in | Wt 283.8 lb

## 2023-03-04 DIAGNOSIS — E7800 Pure hypercholesterolemia, unspecified: Secondary | ICD-10-CM

## 2023-03-04 DIAGNOSIS — I4891 Unspecified atrial fibrillation: Secondary | ICD-10-CM | POA: Diagnosis not present

## 2023-03-04 DIAGNOSIS — R7301 Impaired fasting glucose: Secondary | ICD-10-CM | POA: Diagnosis not present

## 2023-03-04 DIAGNOSIS — I509 Heart failure, unspecified: Secondary | ICD-10-CM | POA: Diagnosis not present

## 2023-03-04 DIAGNOSIS — I10 Essential (primary) hypertension: Secondary | ICD-10-CM

## 2023-03-04 DIAGNOSIS — Z720 Tobacco use: Secondary | ICD-10-CM

## 2023-03-04 DIAGNOSIS — I2721 Secondary pulmonary arterial hypertension: Secondary | ICD-10-CM

## 2023-03-04 DIAGNOSIS — Z0000 Encounter for general adult medical examination without abnormal findings: Secondary | ICD-10-CM | POA: Diagnosis not present

## 2023-03-04 DIAGNOSIS — Z7189 Other specified counseling: Secondary | ICD-10-CM

## 2023-03-04 DIAGNOSIS — G4733 Obstructive sleep apnea (adult) (pediatric): Secondary | ICD-10-CM

## 2023-03-04 LAB — URINALYSIS, ROUTINE W REFLEX MICROSCOPIC
Bilirubin, UA: NEGATIVE
Glucose, UA: NEGATIVE
Ketones, UA: NEGATIVE
Leukocytes,UA: NEGATIVE
Nitrite, UA: NEGATIVE
Protein,UA: NEGATIVE
RBC, UA: NEGATIVE
Specific Gravity, UA: 1.015 (ref 1.005–1.030)
Urobilinogen, Ur: 0.2 mg/dL (ref 0.2–1.0)
pH, UA: 6.5 (ref 5.0–7.5)

## 2023-03-04 MED ORDER — PANTOPRAZOLE SODIUM 40 MG PO TBEC: DELAYED_RELEASE_TABLET | ORAL | 1 refills | Status: DC

## 2023-03-04 MED ORDER — APIXABAN 5 MG PO TABS: 5.0000 mg | ORAL_TABLET | Freq: Two times a day (BID) | ORAL | 1 refills | Status: DC

## 2023-03-04 MED ORDER — OLMESARTAN MEDOXOMIL 20 MG PO TABS: 20.0000 mg | ORAL_TABLET | Freq: Every day | ORAL | 1 refills | Status: DC

## 2023-03-04 MED ORDER — ATORVASTATIN CALCIUM 20 MG PO TABS: ORAL_TABLET | ORAL | 1 refills | Status: DC

## 2023-03-04 MED ORDER — GABAPENTIN 300 MG PO CAPS: ORAL_CAPSULE | ORAL | 3 refills | Status: DC

## 2023-03-04 MED ORDER — LABETALOL HCL 200 MG PO TABS: ORAL_TABLET | ORAL | 1 refills | Status: DC

## 2023-03-04 MED ORDER — FUROSEMIDE 20 MG PO TABS: 20.0000 mg | ORAL_TABLET | Freq: Two times a day (BID) | ORAL | 1 refills | Status: DC

## 2023-03-04 MED ORDER — EZETIMIBE 10 MG PO TABS: ORAL_TABLET | ORAL | 1 refills | Status: DC

## 2023-03-04 NOTE — Assessment & Plan Note (Signed)
Chronic.  Controlled.  Continue with current medication regimen.  Has not followed up with Cardiology since December 2023.  New referral placed at visit today.  Refills sent today.  Labs ordered today.  Return to clinic in 6 months for reevaluation.  Call sooner if concerns arise.

## 2023-03-04 NOTE — Assessment & Plan Note (Signed)
Chronic.  Controlled.  Continue with current medication regimen of Olmesartan, Labetolol, and Lasix.  Was on Olmesartan/hydrochlorothiazide and Lasix.  Stopped hydrochlorothiazide and just sent single tab of Olmesartan. Refill sent.  Labs ordered today.  Return to clinic in 6 months for reevaluation.  Call sooner if concerns arise.

## 2023-03-04 NOTE — Assessment & Plan Note (Signed)
Chronic.  Controlled.  Continue with current medication regimen of Atorvastatin 20mg daily.  Refills sent today.  Labs ordered today.  Return to clinic in 6 months for reevaluation.  Call sooner if concerns arise.   

## 2023-03-04 NOTE — Assessment & Plan Note (Signed)
Has requested new supplies from pulmonology.  Waiting for them to be delivered.

## 2023-03-04 NOTE — Assessment & Plan Note (Signed)
A voluntary discussion about advance care planning including the explanation and discussion of advance directives was extensively discussed  with the patient for 5 minutes with patient.  Explanation about the health care proxy and Living will was reviewed and packet with forms with explanation of how to fill them out was given.  During this discussion, the patient was able to identify a health care proxy as her daughter and does not plan to fill out the paperwork required.  Patient was offered a separate Advance Care Planning visit for further assistance with forms.

## 2023-03-04 NOTE — Progress Notes (Signed)
BP 114/78 (BP Location: Left Arm, Patient Position: Sitting, Cuff Size: Large)   Pulse 63   Temp 97.8 F (36.6 C) (Oral)   Ht 5\' 5"  (1.651 m)   Wt 283 lb 12.8 oz (128.7 kg)   SpO2 96%   BMI 47.23 kg/m    Subjective:    Patient ID: Michelle Jenkins, female    DOB: 01-Feb-1946, 77 y.o.   MRN: [id number]  HPI: KATTALEIA LATSHAW is a 77 y.o. female presenting on 03/04/2023 for comprehensive medical examination. Current medical complaints include:none   She currently lives with: Menopausal Symptoms: no  HYPERTENSION / HYPERLIPIDEMIA Satisfied with current treatment? yes Duration of hypertension: years BP monitoring frequency: not checking BP range:  BP medication side effects: no Past BP meds: labetalol, olmesartan lasix Duration of hyperlipidemia: years Cholesterol medication side effects: no Cholesterol supplements: none Past cholesterol medications: atorvastain (lipitor) Medication compliance: excellent compliance Aspirin: no Recent stressors: no Recurrent headaches: no Visual changes: no Palpitations: no Dyspnea: no Chest pain: no Lower extremity edema: yes Dizzy/lightheaded: no   Functional Status Survey: Is the patient deaf or have difficulty hearing?: No Does the patient have difficulty seeing, even when wearing glasses/contacts?: Yes Does the patient have difficulty concentrating, remembering, or making decisions?: No Does the patient have difficulty walking or climbing stairs?: No Does the patient have difficulty dressing or bathing?: No Does the patient have difficulty doing errands alone such as visiting a doctor's office or shopping?: No     11/21/2022    4:03 PM 03/01/2022   10:05 AM 08/07/2021   10:31 AM 07/19/2021   10:03 AM 02/22/2021   11:08 AM  Fall Risk   Falls in the past year? 0 0 0 0 0  Number falls in past yr: 0 0 0 0 0  Injury with Fall? 0 0 0 0 0  Risk for fall due to : Impaired balance/gait;Impaired mobility No Fall Risks No Fall Risks No  Fall Risks   Follow up Falls evaluation completed Falls evaluation completed Falls evaluation completed Falls evaluation completed Falls evaluation completed;Falls prevention discussed    Depression Screen    03/01/2022   10:05 AM 02/22/2021   11:01 AM 02/19/2020    1:05 PM 02/12/2019   10:23 AM 01/16/2018    9:53 AM  Depression screen PHQ 2/9  Decreased Interest 3 0 0 0 2  Down, Depressed, Hopeless 0 0 0 0 2  PHQ - 2 Score 3 0 0 0 4  Altered sleeping 1    1  Tired, decreased energy 0    1  Change in appetite 2    3  Feeling bad or failure about yourself  0    1  Trouble concentrating 0    0  Moving slowly or fidgety/restless 3    0  Suicidal thoughts 0    0  PHQ-9 Score 9    10  Difficult doing work/chores     Not difficult at all     Advanced Directives Does patient have a HCPOA?    no If yes, name and contact information: Her Daughter Does patient have a living will or MOST form?  no  Past Medical History:  Past Medical History:  Diagnosis Date   Anginal pain (HCC)    Asthma    Bronchitis    CAD (coronary artery disease)    CHF (congestive heart failure) (HCC)    CVA (cerebral infarction)    Dysrhythmia    GERD (gastroesophageal reflux  disease)    History of chronic atrial fibrillation    Hypercholesteremia    Hypertension    Idiopathic pulmonary hypertension (HCC)    Myocardial infarction (HCC)    Obesity    OSA (obstructive sleep apnea)    Pre-diabetes    Pulmonary artery hypertension (HCC)    Shortness of breath dyspnea    Sleep apnea    Stroke [facility])     Surgical History:  Past Surgical History:  Procedure Laterality Date   BREAST BIOPSY Right 11/15/2015   stereo, COMPLEX SCLEROSING LESION WITH INTRADUCTAL PAPILLOMA COMPONENT AND    BREAST EXCISIONAL BIOPSY Right 02/27/2016   NEG   BREAST LUMPECTOMY WITH NEEDLE LOCALIZATION Right 02/27/2016   Procedure: BREAST LUMPECTOMY WITH NEEDLE LOCALIZATION;  Surgeon: Kieth Brightly, MD;  Location: [facility]  ORS;  Service: General;  Laterality: Right;   CARDIAC CATHETERIZATION     CARDIAC CATHETERIZATION Right 12/07/2014   Procedure: Right and Left Heart Cath with Coronary Angiography;  Surgeon: Dalia Heading, MD;  Location: [facility] INVASIVE CV LAB;  Service: Cardiovascular;  Laterality: Right;   CARPAL TUNNEL RELEASE  1998  and 1999   CATARACT EXTRACTION W/PHACO Right 09/24/2019   Procedure: CATARACT EXTRACTION PHACO AND INTRAOCULAR LENS PLACEMENT (IOC) RIGHT;  Surgeon: Galen Manila, MD;  Location: [facility] ORS;  Service: Ophthalmology;  Laterality: Right;  [id number]:44.8 us6.96 [id number]   COLONOSCOPY  10/31/1998   DILATION AND CURETTAGE OF UTERUS     FOOT SURGERY Right    HYSTEROSCOPY     JOINT REPLACEMENT Left    KNEE ARTHROSCOPY AND ARTHROTOMY Right    lt knee replacement     TONSILLECTOMY     UVULOPALATOPHARYNGOPLASTY      Medications:  Current Outpatient Medications on File Prior to Visit  Medication Sig   ambrisentan (LETAIRIS) 10 MG tablet Take 10 mg by mouth daily.   methocarbamol (ROBAXIN) 500 MG tablet    Omega-3 Fatty Acids (FISH OIL PO) Take 1 Dose by mouth daily.   ondansetron (ZOFRAN ODT) 4 MG disintegrating tablet Take 1 tablet (4 mg total) by mouth every 8 (eight) hours as needed for nausea or vomiting.   potassium chloride (KLOR-CON) 10 MEQ tablet TAKE 1 TABLET(10 MEQ) BY MOUTH EVERY DAY   Psyllium (METAMUCIL FIBER PO) Take 1 Dose by mouth. Patient uses Metamucil Powder in the AM   VENTOLIN HFA 108 (90 Base) MCG/ACT inhaler    XYLIMELTS 550 MG DISK DISSOLVE 1 TABLET ON THE TONGUE EVERY 8 HOURS AS NEEDED FOR DRY MOUTH.   No current facility-administered medications on file prior to visit.    Allergies:  Allergies  Allergen Reactions   Ace Inhibitors Cough   Nyquil Multi-Symptom [Pseudoeph-Doxylamine-Dm-Apap] Itching   Tylenol [Acetaminophen] Itching    Social History:  Social History   Socioeconomic History   Marital status: Single    Spouse name: Not on file    Number of children: Not on file   Years of education: Not on file   Highest education level: 8th grade  Occupational History   Occupation: retired  Tobacco Use   Smoking status: Former    Current packs/day: 0.00    Types: Cigarettes    Quit date: 06/15/2014    Years since quitting: 8.7   Smokeless tobacco: Never   Tobacco comments:    4-5 cigarettes a day when she smoked  Vaping Use   Vaping status: Never Used  Substance and Sexual Activity   Alcohol use: Yes    Alcohol/week: 0.0 -  1.0 standard drinks of alcohol    Comment: occasionally   Drug use: No   Sexual activity: Not Currently  Other Topics Concern   Not on file  Social History Narrative   Not on file   Social Determinants of Health   Financial Resource Strain: Low Risk  (02/22/2021)   Overall Financial Resource Strain (CARDIA)    Difficulty of Paying Living Expenses: Not very hard  Food Insecurity: No Food Insecurity (02/22/2021)   Hunger Vital Sign    Worried About Running Out of Food in the Last Year: Never true    Ran Out of Food in the Last Year: Never true  Transportation Needs: No Transportation Needs (02/22/2021)   PRAPARE - Administrator, Civil Service (Medical): No    Lack of Transportation (Non-Medical): No  Physical Activity: Inactive (02/22/2021)   Exercise Vital Sign    Days of Exercise per Week: 0 days    Minutes of Exercise per Session: 0 min  Stress: No Stress Concern Present (02/22/2021)   [employer] of Occupational Health - Occupational Stress Questionnaire    Feeling of Stress : Not at all  Social Connections: Unknown (02/22/2021)   Social Connection and Isolation Panel [NHANES]    Frequency of Communication with Friends and Family: Three times a week    Frequency of Social Gatherings with Friends and Family: Three times a week    Attends Religious Services: 1 to 4 times per year    Active Member of Clubs or Organizations: No    Attends Banker Meetings: Never     Marital Status: Not on file  Intimate Partner Violence: Not At Risk (02/22/2021)   Humiliation, Afraid, Rape, and Kick questionnaire    Fear of Current or Ex-Partner: No    Emotionally Abused: No    Physically Abused: No    Sexually Abused: No   Social History   Tobacco Use  Smoking Status Former   Current packs/day: 0.00   Types: Cigarettes   Quit date: 06/15/2014   Years since quitting: 8.7  Smokeless Tobacco Never  Tobacco Comments   4-5 cigarettes a day when she smoked   Social History   Substance and Sexual Activity  Alcohol Use Yes   Alcohol/week: 0.0 - 1.0 standard drinks of alcohol   Comment: occasionally    Family History:  Family History  Problem Relation Age of Onset   Diabetes Mother    Alzheimer's disease Mother    Throat cancer Brother    Diabetes Brother    Cancer Brother        lung   Hypertension Brother    Cancer Daughter 45       metastatic uterine PECOMA to the liver and lungs   Rheumatic fever Daughter    Diabetes Sister    Hypertension Sister    COPD Neg Hx    Heart disease Neg Hx    Stroke Neg Hx    Breast cancer Neg Hx     Past medical history, surgical history, medications, allergies, family history and social history reviewed with patient today and changes made to appropriate areas of the chart.   Review of Systems  Eyes:  Negative for blurred vision and double vision.  Respiratory:  Negative for shortness of breath.   Cardiovascular:  Negative for chest pain, palpitations and leg swelling.  Musculoskeletal:        Right shoulder pain  Neurological:  Negative for dizziness and headaches.    All  other ROS negative except what is listed above and in the HPI.      Objective:    BP 114/78 (BP Location: Left Arm, Patient Position: Sitting, Cuff Size: Large)   Pulse 63   Temp 97.8 F (36.6 C) (Oral)   Ht 5\' 5"  (1.651 m)   Wt 283 lb 12.8 oz (128.7 kg)   SpO2 96%   BMI 47.23 kg/m   Wt Readings from Last 3 Encounters:   03/04/23 283 lb 12.8 oz (128.7 kg)  01/17/23 285 lb (129.3 kg)  12/07/22 282 lb (127.9 kg)    No results found.  Physical Exam Vitals and nursing note reviewed.  Constitutional:      General: She is awake. She is not in acute distress.    Appearance: Normal appearance. She is well-developed. She is obese. She is not ill-appearing.  HENT:     Head: Normocephalic and atraumatic.     Right Ear: Hearing, tympanic membrane, ear canal and external ear normal. No drainage.     Left Ear: Hearing, tympanic membrane, ear canal and external ear normal. No drainage.     Nose: Nose normal.     Right Sinus: No maxillary sinus tenderness or frontal sinus tenderness.     Left Sinus: No maxillary sinus tenderness or frontal sinus tenderness.     Mouth/Throat:     Mouth: Mucous membranes are moist.     Pharynx: Oropharynx is clear. Uvula midline. No pharyngeal swelling, oropharyngeal exudate or posterior oropharyngeal erythema.  Eyes:     General: Lids are normal.        Right eye: No discharge.        Left eye: No discharge.     Extraocular Movements: Extraocular movements intact.     Conjunctiva/sclera: Conjunctivae normal.     Pupils: Pupils are equal, round, and reactive to light.     Visual Fields: Right eye visual fields normal and left eye visual fields normal.  Neck:     Thyroid: No thyromegaly.     Vascular: No carotid bruit.     Trachea: Trachea normal.  Cardiovascular:     Rate and Rhythm: Normal rate and regular rhythm.     Heart sounds: Normal heart sounds. No murmur heard.    No gallop.  Pulmonary:     Effort: Pulmonary effort is normal. No accessory muscle usage or respiratory distress.     Breath sounds: Normal breath sounds.  Chest:  Breasts:    Right: Normal.     Left: Normal.  Abdominal:     General: Bowel sounds are normal.     Palpations: Abdomen is soft. There is no hepatomegaly or splenomegaly.     Tenderness: There is no abdominal tenderness.  Musculoskeletal:         General: Normal range of motion.     Cervical back: Normal range of motion and neck supple.     Right lower leg: No edema.     Left lower leg: No edema.  Lymphadenopathy:     Head:     Right side of head: No submental, submandibular, tonsillar, preauricular or posterior auricular adenopathy.     Left side of head: No submental, submandibular, tonsillar, preauricular or posterior auricular adenopathy.     Cervical: No cervical adenopathy.     Upper Body:     Right upper body: No supraclavicular, axillary or pectoral adenopathy.     Left upper body: No supraclavicular, axillary or pectoral adenopathy.  Skin:  General: Skin is warm and dry.     Capillary Refill: Capillary refill takes less than 2 seconds.     Findings: No rash.  Neurological:     Mental Status: She is alert and oriented to person, place, and time.     Gait: Gait is intact.  Psychiatric:        Attention and Perception: Attention normal.        Mood and Affect: Mood normal.        Speech: Speech normal.        Behavior: Behavior normal. Behavior is cooperative.        Thought Content: Thought content normal.        Judgment: Judgment normal.        03/04/2023   10:41 AM 02/19/2020    1:10 PM 01/16/2018    9:56 AM 12/13/2016   10:36 AM  6CIT Screen  What Year? 0 points 0 points 0 points 0 points  What month? 0 points 0 points 0 points 0 points  What time? 0 points 0 points 0 points 0 points  Count back from 20 0 points 0 points 0 points 0 points  Months in reverse 0 points 2 points 0 points 0 points  Repeat phrase 4 points 8 points 2 points 4 points  Total Score 4 points 10 points 2 points 4 points     Results for orders placed or performed in visit on 03/04/23  Urinalysis, Routine w reflex microscopic  Result Value Ref Range   Specific Gravity, UA 1.015 1.005 - 1.030   pH, UA 6.5 5.0 - 7.5   Color, UA Yellow Yellow   Appearance Ur Clear Clear   Leukocytes,UA Negative Negative   Protein,UA  Negative Negative/Trace   Glucose, UA Negative Negative   Ketones, UA Negative Negative   RBC, UA Negative Negative   Bilirubin, UA Negative Negative   Urobilinogen, Ur 0.2 0.2 - 1.0 mg/dL   Nitrite, UA Negative Negative   Microscopic Examination Comment       Assessment & Plan:   Problem List Items Addressed This Visit       Cardiovascular and Mediastinum   Hypertension    Chronic.  Controlled.  Continue with current medication regimen of Olmesartan, Labetolol, and Lasix.  Was on Olmesartan/hydrochlorothiazide and Lasix.  Stopped hydrochlorothiazide and just sent single tab of Olmesartan. Refill sent.  Labs ordered today.  Return to clinic in 6 months for reevaluation.  Call sooner if concerns arise.       Relevant Medications   apixaban (ELIQUIS) 5 MG TABS tablet   atorvastatin (LIPITOR) 20 MG tablet   ezetimibe (ZETIA) 10 MG tablet   furosemide (LASIX) 20 MG tablet   labetalol (NORMODYNE) 200 MG tablet   olmesartan (BENICAR) 20 MG tablet   Pulmonary artery hypertension (HCC)    Chronic.  Followed by Pulmonology.  On 3L.  Continue to follow up with specialist.      Relevant Medications   apixaban (ELIQUIS) 5 MG TABS tablet   atorvastatin (LIPITOR) 20 MG tablet   ezetimibe (ZETIA) 10 MG tablet   furosemide (LASIX) 20 MG tablet   labetalol (NORMODYNE) 200 MG tablet   olmesartan (BENICAR) 20 MG tablet   Other Relevant Orders   Ambulatory referral to Cardiology   Atrial fibrillation (HCC)    Chronic.  Controlled.  Continue with current medication regimen.  Continue with Eliquis.  Has not followed up with Cardiology since December 2023.  New referral placed at visit  today.  Refills sent today.  Labs ordered today.  Return to clinic in 6 months for reevaluation.  Call sooner if concerns arise.        Relevant Medications   apixaban (ELIQUIS) 5 MG TABS tablet   atorvastatin (LIPITOR) 20 MG tablet   ezetimibe (ZETIA) 10 MG tablet   furosemide (LASIX) 20 MG tablet    labetalol (NORMODYNE) 200 MG tablet   olmesartan (BENICAR) 20 MG tablet   Other Relevant Orders   CBC with Differential/Platelet   CHF (congestive heart failure) (HCC)    Chronic.  Controlled.  Continue with current medication regimen.  Has not followed up with Cardiology since December 2023.  New referral placed at visit today.  Refills sent today.  Labs ordered today.  Return to clinic in 6 months for reevaluation.  Call sooner if concerns arise.       Relevant Medications   apixaban (ELIQUIS) 5 MG TABS tablet   atorvastatin (LIPITOR) 20 MG tablet   ezetimibe (ZETIA) 10 MG tablet   furosemide (LASIX) 20 MG tablet   labetalol (NORMODYNE) 200 MG tablet   olmesartan (BENICAR) 20 MG tablet   Other Relevant Orders   CBC with Differential/Platelet   Ambulatory referral to Cardiology     Respiratory   Obstructive apnea    Has requested new supplies from pulmonology.  Waiting for them to be delivered.       Relevant Orders   CBC with Differential/Platelet     Endocrine   IFG (impaired fasting glucose)    Labs ordered at visit today.  Will make recommendations based on lab results.        Relevant Orders   HgB A1c     Other   Hypercholesteremia    Chronic.  Controlled.  Continue with current medication regimen of Atorvastatin 20mg  daily.  Refills sent today.  Labs ordered today.  Return to clinic in 6 months for reevaluation.  Call sooner if concerns arise.        Relevant Medications   apixaban (ELIQUIS) 5 MG TABS tablet   atorvastatin (LIPITOR) 20 MG tablet   ezetimibe (ZETIA) 10 MG tablet   furosemide (LASIX) 20 MG tablet   labetalol (NORMODYNE) 200 MG tablet   olmesartan (BENICAR) 20 MG tablet   Other Relevant Orders   Lipid panel   Advanced care planning/counseling discussion    A voluntary discussion about advance care planning including the explanation and discussion of advance directives was extensively discussed  with the patient for 5 minutes with patient.   Explanation about the health care proxy and Living will was reviewed and packet with forms with explanation of how to fill them out was given.  During this discussion, the patient was able to identify a health care proxy as her daughter and does not plan to fill out the paperwork required.  Patient was offered a separate Advance Care Planning visit for further assistance with forms.         Other Visit Diagnoses     Encounter for annual wellness exam in Medicare patient    -  Primary   Annual physical exam       Health maintenance reviewed during visit today.  Labs ordered.  Vaccines up to date.   Relevant Orders   CBC with Differential/Platelet   Comprehensive metabolic panel   Lipid panel   TSH   Urinalysis, Routine w reflex microscopic (Completed)   HgB A1c   Tobacco abuse  Relevant Orders   Ambulatory Referral Lung Cancer Screening [facility]         Preventative Services:  AAA screening: NA Health Risk Assessment and Personalized Prevention Plan: Up to date Bone Mass Measurements: Up to date Breast Cancer Screening: up to date CVD Screening: up to date Cervical Cancer Screening: NA Colon Cancer Screening: NA Depression Screening: Done today Diabetes Screening: Done today Glaucoma Screening: Up to date Hepatitis B vaccine: NA Hepatitis C screening: Up to date HIV Screening: Up to date Flu Vaccine: Up to date Lung cancer Screening: Ordered today Obesity Screening: Up to date Pneumonia Vaccines (2): Up to date STI Screening:NA  Follow up plan: Return in about 6 months (around 09/01/2023) for HTN, HLD, DM2 FU.   LABORATORY TESTING:  - Pap smear: not applicable  IMMUNIZATIONS:   - Tdap: Tetanus vaccination status reviewed: Medicare. - Influenza: Up to date - Pneumovax: Up to date - Prevnar: Up to date - Zostavax vaccine: Up to date  SCREENING: -Mammogram: up to date - Colonoscopy: Not applicable  - Bone Density: Not applicable  -Hearing Test:  Not applicable  -Spirometry: Not applicable   PATIENT COUNSELING:   Advised to take 1 mg of folate supplement per day if capable of pregnancy.   Sexuality: Discussed sexually transmitted diseases, partner selection, use of condoms, avoidance of unintended pregnancy  and contraceptive alternatives.   Advised to avoid cigarette smoking.  I discussed with the patient that most people either abstain from alcohol or drink within safe limits (<=14/week and <=4 drinks/occasion for males, <=7/weeks and <= 3 drinks/occasion for females) and that the risk for alcohol disorders and other health effects rises proportionally with the number of drinks per week and how often a drinker exceeds daily limits.  Discussed cessation/primary prevention of drug use and availability of treatment for abuse.   Diet: Encouraged to adjust caloric intake to maintain  or achieve ideal body weight, to reduce intake of dietary saturated fat and total fat, to limit sodium intake by avoiding high sodium foods and not adding table salt, and to maintain adequate dietary potassium and calcium preferably from fresh fruits, vegetables, and low-fat dairy products.    stressed the importance of regular exercise  Injury prevention: Discussed safety belts, safety helmets, smoke detector, smoking near bedding or upholstery.   Dental health: Discussed importance of regular tooth brushing, flossing, and dental visits.    NEXT PREVENTATIVE PHYSICAL DUE IN 1 YEAR. Return in about 6 months (around 09/01/2023) for HTN, HLD, DM2 FU.

## 2023-03-04 NOTE — Assessment & Plan Note (Signed)
Labs ordered at visit today.  Will make recommendations based on lab results.   

## 2023-03-04 NOTE — Assessment & Plan Note (Signed)
Chronic.  Followed by Pulmonology.  On 3L.  Continue to follow up with specialist.

## 2023-03-04 NOTE — Patient Instructions (Addendum)
Stop Olmesartan with Hydrochlorothiazide. I sent you a prescription for just Olmesartan.  You are already taking Lasix (Furosemide) which is a diuretic.  You do not need to diuretics (fluid pills).

## 2023-03-04 NOTE — Assessment & Plan Note (Signed)
Chronic.  Controlled.  Continue with current medication regimen.  Continue with Eliquis.  Has not followed up with Cardiology since December 2023.  New referral placed at visit today.  Refills sent today.  Labs ordered today.  Return to clinic in 6 months for reevaluation.  Call sooner if concerns arise.

## 2023-03-05 LAB — COMPREHENSIVE METABOLIC PANEL
ALT: 11 [IU]/L (ref 0–32)
AST: 20 [IU]/L (ref 0–40)
Albumin: 4.3 g/dL (ref 3.8–4.8)
Alkaline Phosphatase: 62 [IU]/L (ref 44–121)
BUN/Creatinine Ratio: 17 (ref 12–28)
BUN: 21 mg/dL (ref 8–27)
Bilirubin Total: 0.7 mg/dL (ref 0.0–1.2)
CO2: 25 mmol/L (ref 20–29)
Calcium: 9.4 mg/dL (ref 8.7–10.3)
Chloride: 104 mmol/L (ref 96–106)
Creatinine, Ser: 1.23 mg/dL — ABNORMAL HIGH (ref 0.57–1.00)
Globulin, Total: 2.1 g/dL (ref 1.5–4.5)
Glucose: 99 mg/dL (ref 70–99)
Potassium: 3.4 mmol/L — ABNORMAL LOW (ref 3.5–5.2)
Sodium: 145 mmol/L — ABNORMAL HIGH (ref 134–144)
Total Protein: 6.4 g/dL (ref 6.0–8.5)
eGFR: 45 mL/min/{1.73_m2} — ABNORMAL LOW (ref >59)

## 2023-03-05 LAB — HEMOGLOBIN A1C
Est. average glucose Bld gHb Est-mCnc: 126 mg/dL
Hgb A1c MFr Bld: 6 % — ABNORMAL HIGH (ref 4.8–5.6)

## 2023-03-05 LAB — TSH: TSH: 2.82 u[IU]/mL (ref 0.450–4.500)

## 2023-03-05 LAB — CBC WITH DIFFERENTIAL/PLATELET
Basophils Absolute: 0 10*3/uL (ref 0.0–0.2)
Basos: 1 %
EOS (ABSOLUTE): 0.1 10*3/uL (ref 0.0–0.4)
Eos: 3 %
Hematocrit: 34.4 % (ref 34.0–46.6)
Hemoglobin: 11.1 g/dL (ref 11.1–15.9)
Immature Grans (Abs): 0 10*3/uL (ref 0.0–0.1)
Immature Granulocytes: 0 %
Lymphocytes Absolute: 1.8 10*3/uL (ref 0.7–3.1)
Lymphs: 42 %
MCH: 31.8 pg (ref 26.6–33.0)
MCHC: 32.3 g/dL (ref 31.5–35.7)
MCV: 99 fL — ABNORMAL HIGH (ref 79–97)
Monocytes Absolute: 0.4 10*3/uL (ref 0.1–0.9)
Monocytes: 10 %
Neutrophils Absolute: 2 10*3/uL (ref 1.4–7.0)
Neutrophils: 44 %
Platelets: 146 10*3/uL — ABNORMAL LOW (ref 150–450)
RBC: 3.49 x10E6/uL — ABNORMAL LOW (ref 3.77–5.28)
RDW: 13.4 % (ref 11.7–15.4)
WBC: 4.4 10*3/uL (ref 3.4–10.8)

## 2023-03-05 LAB — LIPID PANEL
Chol/HDL Ratio: 1.8 ratio (ref 0.0–4.4)
Cholesterol, Total: 168 mg/dL (ref 100–199)
HDL: 95 mg/dL (ref >39)
LDL Chol Calc (NIH): 63 mg/dL (ref 0–99)
Triglycerides: 44 mg/dL (ref 0–149)
VLDL Cholesterol Cal: 10 mg/dL (ref 5–40)

## 2023-03-06 ENCOUNTER — Telehealth: Payer: Self-pay

## 2023-03-06 NOTE — Telephone Encounter (Signed)
Patient returned call and notified of lab result and provider recommendation.

## 2023-03-06 NOTE — Telephone Encounter (Signed)
-----   Message from Larae Grooms sent at 03/05/2023 10:02 AM EST ----- Please let patient know that her lab work looks good. Her kidney function and anemia are well controlled.  Her prediabetes is also controlled, no need for medication.  No concerns at this time.  Continue with current medication regimen.  Follow up as discussed.

## 2023-03-22 ENCOUNTER — Emergency Department: Payer: 59

## 2023-03-22 ENCOUNTER — Encounter: Payer: Self-pay | Admitting: Emergency Medicine

## 2023-03-22 ENCOUNTER — Other Ambulatory Visit: Payer: Self-pay

## 2023-03-22 ENCOUNTER — Inpatient Hospital Stay
Admission: EM | Admit: 2023-03-22 | Discharge: 2023-03-26 | DRG: 291 | Disposition: A | Discharge status: Home Health | Payer: 59 | Attending: Student | Admitting: Student

## 2023-03-22 DIAGNOSIS — I3139 Other pericardial effusion (noninflammatory): Secondary | ICD-10-CM | POA: Diagnosis present

## 2023-03-22 DIAGNOSIS — I110 Hypertensive heart disease with heart failure: Secondary | ICD-10-CM | POA: Diagnosis not present

## 2023-03-22 DIAGNOSIS — R7303 Prediabetes: Secondary | ICD-10-CM | POA: Diagnosis present

## 2023-03-22 DIAGNOSIS — Z7984 Long term (current) use of oral hypoglycemic drugs: Secondary | ICD-10-CM

## 2023-03-22 DIAGNOSIS — I2510 Atherosclerotic heart disease of native coronary artery without angina pectoris: Secondary | ICD-10-CM | POA: Diagnosis present

## 2023-03-22 DIAGNOSIS — G4733 Obstructive sleep apnea (adult) (pediatric): Secondary | ICD-10-CM | POA: Diagnosis present

## 2023-03-22 DIAGNOSIS — I2721 Secondary pulmonary arterial hypertension: Secondary | ICD-10-CM | POA: Diagnosis present

## 2023-03-22 DIAGNOSIS — I270 Primary pulmonary hypertension: Secondary | ICD-10-CM | POA: Diagnosis present

## 2023-03-22 DIAGNOSIS — Z8249 Family history of ischemic heart disease and other diseases of the circulatory system: Secondary | ICD-10-CM

## 2023-03-22 DIAGNOSIS — Z8673 Personal history of transient ischemic attack (TIA), and cerebral infarction without residual deficits: Secondary | ICD-10-CM

## 2023-03-22 DIAGNOSIS — E66813 Obesity, class 3: Secondary | ICD-10-CM | POA: Insufficient documentation

## 2023-03-22 DIAGNOSIS — Q2112 Patent foramen ovale: Secondary | ICD-10-CM

## 2023-03-22 DIAGNOSIS — I4891 Unspecified atrial fibrillation: Secondary | ICD-10-CM | POA: Diagnosis present

## 2023-03-22 DIAGNOSIS — I5033 Acute on chronic diastolic (congestive) heart failure: Secondary | ICD-10-CM | POA: Diagnosis not present

## 2023-03-22 DIAGNOSIS — I482 Chronic atrial fibrillation, unspecified: Secondary | ICD-10-CM | POA: Diagnosis present

## 2023-03-22 DIAGNOSIS — J449 Chronic obstructive pulmonary disease, unspecified: Secondary | ICD-10-CM

## 2023-03-22 DIAGNOSIS — Z96652 Presence of left artificial knee joint: Secondary | ICD-10-CM | POA: Diagnosis present

## 2023-03-22 DIAGNOSIS — I517 Cardiomegaly: Secondary | ICD-10-CM

## 2023-03-22 DIAGNOSIS — I252 Old myocardial infarction: Secondary | ICD-10-CM

## 2023-03-22 DIAGNOSIS — Z833 Family history of diabetes mellitus: Secondary | ICD-10-CM

## 2023-03-22 DIAGNOSIS — D649 Anemia, unspecified: Secondary | ICD-10-CM

## 2023-03-22 DIAGNOSIS — I10 Essential (primary) hypertension: Secondary | ICD-10-CM | POA: Diagnosis present

## 2023-03-22 DIAGNOSIS — D638 Anemia in other chronic diseases classified elsewhere: Secondary | ICD-10-CM | POA: Diagnosis present

## 2023-03-22 DIAGNOSIS — Z555 Less than a high school diploma: Secondary | ICD-10-CM

## 2023-03-22 DIAGNOSIS — Z6841 Body Mass Index (BMI) 40.0 and over, adult: Secondary | ICD-10-CM

## 2023-03-22 DIAGNOSIS — J9621 Acute and chronic respiratory failure with hypoxia: Secondary | ICD-10-CM

## 2023-03-22 DIAGNOSIS — Z7901 Long term (current) use of anticoagulants: Secondary | ICD-10-CM

## 2023-03-22 DIAGNOSIS — K219 Gastro-esophageal reflux disease without esophagitis: Secondary | ICD-10-CM | POA: Diagnosis present

## 2023-03-22 DIAGNOSIS — I509 Heart failure, unspecified: Secondary | ICD-10-CM

## 2023-03-22 DIAGNOSIS — Z1152 Encounter for screening for COVID-19: Secondary | ICD-10-CM

## 2023-03-22 DIAGNOSIS — J441 Chronic obstructive pulmonary disease with (acute) exacerbation: Principal | ICD-10-CM

## 2023-03-22 DIAGNOSIS — F17210 Nicotine dependence, cigarettes, uncomplicated: Secondary | ICD-10-CM | POA: Diagnosis present

## 2023-03-22 DIAGNOSIS — E7800 Pure hypercholesterolemia, unspecified: Secondary | ICD-10-CM | POA: Diagnosis present

## 2023-03-22 DIAGNOSIS — Z79899 Other long term (current) drug therapy: Secondary | ICD-10-CM

## 2023-03-22 DIAGNOSIS — R0602 Shortness of breath: Secondary | ICD-10-CM | POA: Diagnosis not present

## 2023-03-22 LAB — BASIC METABOLIC PANEL
Anion gap: 9 (ref 5–15)
BUN: 14 mg/dL (ref 8–23)
CO2: 33 mmol/L — ABNORMAL HIGH (ref 22–32)
Calcium: 8.9 mg/dL (ref 8.9–10.3)
Chloride: 102 mmol/L (ref 98–111)
Creatinine, Ser: 1.06 mg/dL — ABNORMAL HIGH (ref 0.44–1.00)
GFR, Estimated: 53 mL/min — ABNORMAL LOW (ref >60)
Glucose, Bld: 105 mg/dL — ABNORMAL HIGH (ref 70–99)
Potassium: 3.8 mmol/L (ref 3.5–5.1)
Sodium: 144 mmol/L (ref 135–145)

## 2023-03-22 LAB — BRAIN NATRIURETIC PEPTIDE: B Natriuretic Peptide: 474.5 pg/mL — ABNORMAL HIGH (ref 0.0–100.0)

## 2023-03-22 LAB — CBC
HCT: 30 % — ABNORMAL LOW (ref 36.0–46.0)
Hemoglobin: 9.5 g/dL — ABNORMAL LOW (ref 12.0–15.0)
MCH: 32 pg (ref 26.0–34.0)
MCHC: 31.7 g/dL (ref 30.0–36.0)
MCV: 101 fL — ABNORMAL HIGH (ref 80.0–100.0)
Platelets: 150 10*3/uL (ref 150–400)
RBC: 2.97 MIL/uL — ABNORMAL LOW (ref 3.87–5.11)
RDW: 15.2 % (ref 11.5–15.5)
WBC: 4.7 10*3/uL (ref 4.0–10.5)
nRBC: 0 % (ref 0.0–0.2)

## 2023-03-22 LAB — RESP PANEL BY RT-PCR (RSV, FLU A&B, COVID)  RVPGX2
Influenza A by PCR: NEGATIVE
Influenza B by PCR: NEGATIVE
Resp Syncytial Virus by PCR: NEGATIVE
SARS Coronavirus 2 by RT PCR: NEGATIVE

## 2023-03-22 LAB — IRON AND TIBC
Iron: 43 ug/dL (ref 28–170)
Saturation Ratios: 15 % (ref 10.4–31.8)
TIBC: 297 ug/dL (ref 250–450)
UIBC: 254 ug/dL

## 2023-03-22 LAB — TROPONIN I (HIGH SENSITIVITY)
Troponin I (High Sensitivity): 12 ng/L (ref <18)
Troponin I (High Sensitivity): 12 ng/L (ref <18)

## 2023-03-22 LAB — RETICULOCYTES
Immature Retic Fract: 25.1 % — ABNORMAL HIGH (ref 2.3–15.9)
RBC.: 3.26 MIL/uL — ABNORMAL LOW (ref 3.87–5.11)
Retic Count, Absolute: 67.5 10*3/uL (ref 19.0–186.0)
Retic Ct Pct: 2.1 % (ref 0.4–3.1)

## 2023-03-22 LAB — HEPATIC FUNCTION PANEL
ALT: 31 U/L (ref 0–44)
AST: 29 U/L (ref 15–41)
Albumin: 4.1 g/dL (ref 3.5–5.0)
Alkaline Phosphatase: 59 U/L (ref 38–126)
Bilirubin, Direct: 0.2 mg/dL (ref 0.0–0.2)
Indirect Bilirubin: 0.8 mg/dL (ref 0.3–0.9)
Total Bilirubin: 1 mg/dL (ref <1.2)
Total Protein: 7.3 g/dL (ref 6.5–8.1)

## 2023-03-22 LAB — VITAMIN B12: Vitamin B-12: 407 pg/mL (ref 180–914)

## 2023-03-22 LAB — FOLATE: Folate: 15.4 ng/mL (ref >5.9)

## 2023-03-22 LAB — FERRITIN: Ferritin: 74 ng/mL (ref 11–307)

## 2023-03-22 MED ORDER — FUROSEMIDE 10 MG/ML IJ SOLN
40.0000 mg | Freq: Two times a day (BID) | INTRAMUSCULAR | Status: DC
  Administered 2023-03-22 – 2023-03-24 (×5): 40 mg via INTRAVENOUS
  Filled 2023-03-22 (×6): qty 4

## 2023-03-22 MED ORDER — GUAIFENESIN ER 600 MG PO TB12
600.0000 mg | ORAL_TABLET | Freq: Two times a day (BID) | ORAL | Status: DC
  Administered 2023-03-22 – 2023-03-26 (×8): 600 mg via ORAL
  Filled 2023-03-22 (×8): qty 1

## 2023-03-22 MED ORDER — METOPROLOL TARTRATE 25 MG PO TABS
12.5000 mg | ORAL_TABLET | Freq: Two times a day (BID) | ORAL | Status: DC
  Administered 2023-03-22 – 2023-03-25 (×6): 12.5 mg via ORAL
  Filled 2023-03-22 (×6): qty 1

## 2023-03-22 MED ORDER — MORPHINE SULFATE (PF) 2 MG/ML IV SOLN: 2.0000 mg | INTRAVENOUS | Status: DC | PRN

## 2023-03-22 MED ORDER — ONDANSETRON HCL 4 MG PO TABS: 4.0000 mg | ORAL_TABLET | Freq: Four times a day (QID) | ORAL | Status: DC | PRN

## 2023-03-22 MED ORDER — ATORVASTATIN CALCIUM 20 MG PO TABS
20.0000 mg | ORAL_TABLET | Freq: Every day | ORAL | Status: DC
  Administered 2023-03-23 – 2023-03-26 (×4): 20 mg via ORAL
  Filled 2023-03-22 (×2): qty 1
  Filled 2023-03-22: qty 2
  Filled 2023-03-22: qty 1

## 2023-03-22 MED ORDER — ALBUTEROL SULFATE (2.5 MG/3ML) 0.083% IN NEBU: 2.5000 mg | INHALATION_SOLUTION | RESPIRATORY_TRACT | Status: DC | PRN

## 2023-03-22 MED ORDER — AMBRISENTAN 5 MG PO TABS: 10.0000 mg | ORAL_TABLET | Freq: Every day | ORAL | Status: DC

## 2023-03-22 MED ORDER — METHYLPREDNISOLONE SODIUM SUCC 40 MG IJ SOLR
40.0000 mg | Freq: Two times a day (BID) | INTRAMUSCULAR | Status: AC
  Administered 2023-03-23 (×2): 40 mg via INTRAVENOUS
  Filled 2023-03-22 (×2): qty 1

## 2023-03-22 MED ORDER — ACETAMINOPHEN 325 MG PO TABS: 650.0000 mg | ORAL_TABLET | Freq: Four times a day (QID) | ORAL | Status: DC | PRN

## 2023-03-22 MED ORDER — IPRATROPIUM-ALBUTEROL 0.5-2.5 (3) MG/3ML IN SOLN
3.0000 mL | Freq: Once | RESPIRATORY_TRACT | Status: AC
  Administered 2023-03-22: 3 mL via RESPIRATORY_TRACT
  Filled 2023-03-22: qty 9

## 2023-03-22 MED ORDER — HYDROCODONE-ACETAMINOPHEN 5-325 MG PO TABS: 1.0000 | ORAL_TABLET | ORAL | Status: DC | PRN

## 2023-03-22 MED ORDER — IPRATROPIUM-ALBUTEROL 0.5-2.5 (3) MG/3ML IN SOLN
3.0000 mL | Freq: Once | RESPIRATORY_TRACT | Status: AC
  Administered 2023-03-22: 3 mL via RESPIRATORY_TRACT

## 2023-03-22 MED ORDER — APIXABAN 5 MG PO TABS
5.0000 mg | ORAL_TABLET | Freq: Two times a day (BID) | ORAL | Status: DC
  Administered 2023-03-22 – 2023-03-26 (×8): 5 mg via ORAL
  Filled 2023-03-22 (×8): qty 1

## 2023-03-22 MED ORDER — ONDANSETRON HCL 4 MG/2ML IJ SOLN: 4.0000 mg | Freq: Four times a day (QID) | INTRAMUSCULAR | Status: DC | PRN

## 2023-03-22 MED ORDER — PREDNISONE 20 MG PO TABS
40.0000 mg | ORAL_TABLET | Freq: Every day | ORAL | Status: DC
  Administered 2023-03-24 – 2023-03-26 (×3): 40 mg via ORAL
  Filled 2023-03-22 (×3): qty 2

## 2023-03-22 MED ORDER — IPRATROPIUM-ALBUTEROL 0.5-2.5 (3) MG/3ML IN SOLN
3.0000 mL | Freq: Four times a day (QID) | RESPIRATORY_TRACT | Status: DC
  Administered 2023-03-22 – 2023-03-23 (×2): 3 mL via RESPIRATORY_TRACT
  Filled 2023-03-22 (×2): qty 3

## 2023-03-22 MED ORDER — AMBRISENTAN 5 MG PO TABS
10.0000 mg | ORAL_TABLET | Freq: Every day | ORAL | Status: DC
  Administered 2023-03-23 – 2023-03-26 (×3): 10 mg via ORAL
  Filled 2023-03-22 (×4): qty 2

## 2023-03-22 MED ORDER — METHYLPREDNISOLONE SODIUM SUCC 125 MG IJ SOLR
125.0000 mg | Freq: Once | INTRAMUSCULAR | Status: AC
  Administered 2023-03-22: 125 mg via INTRAVENOUS
  Filled 2023-03-22: qty 2

## 2023-03-22 MED ORDER — IRBESARTAN 150 MG PO TABS
75.0000 mg | ORAL_TABLET | Freq: Every day | ORAL | Status: DC
  Administered 2023-03-23 – 2023-03-26 (×4): 75 mg via ORAL
  Filled 2023-03-22 (×4): qty 1

## 2023-03-22 MED ORDER — ACETAMINOPHEN 650 MG RE SUPP: 650.0000 mg | Freq: Four times a day (QID) | RECTAL | Status: DC | PRN

## 2023-03-22 MED ORDER — FUROSEMIDE 10 MG/ML IJ SOLN
40.0000 mg | Freq: Once | INTRAMUSCULAR | Status: AC
  Administered 2023-03-22: 40 mg via INTRAVENOUS
  Filled 2023-03-22: qty 4

## 2023-03-22 NOTE — Assessment & Plan Note (Addendum)
Continue telmisartan.  Adding metoprolol

## 2023-03-22 NOTE — ED Provider Triage Note (Signed)
Emergency Medicine Provider Triage Evaluation Note  Michelle Jenkins , a 77 y.o. female  was evaluated in triage.  Pt complains of sob and pain, swelling in legs, swelling in breasts, hx copd.  Review of Systems  Positive:  Negative:   Physical Exam  There were no vitals taken for this visit. Gen:   Awake, no distress   Resp:  Normal effort  MSK:   Moves extremities without difficulty  Other:    Medical Decision Making  Medically screening exam initiated at 12:51 PM.  Appropriate orders placed.  Michelle Jenkins was informed that the remainder of the evaluation will be completed by another provider, this initial triage assessment does not replace that evaluation, and the importance of remaining in the ED until their evaluation is complete.     Faythe Ghee, PA-C 03/22/23 1252

## 2023-03-22 NOTE — Assessment & Plan Note (Signed)
Obstructive sleep apnea Morbid obesity with BMI 47 Continue Ambrisentan Continue CPAP nightly Complicating factors to overall prognosis and care Patient follows with pulmonology last seen in May 2024

## 2023-03-22 NOTE — Assessment & Plan Note (Addendum)
Cardiomegaly versus pericardial effusion on chest x-ray Pulmonary vascular congestion on chest x-ray with BNP 474 and large cardiac silhouette on CXR IV Lasix No recent echo.  No recent cardiology follow-up since 2022 Echo ordered Continue olmesartan, add metoprolol Daily weights with intake and output monitor

## 2023-03-22 NOTE — Assessment & Plan Note (Signed)
Likely multifactorial related to exacerbation of CHF and COPD with underlying OSA and PAH as well as concern for enlarged cardiac silhouette (cardiomegaly versus pericardial effusion) Refer to respective problems for management of underlying suspected etiologies Continue supplemental oxygen and wean to baseline as tolerated

## 2023-03-22 NOTE — Assessment & Plan Note (Signed)
Chronic anticoagulation Continue A-fib and labetalol

## 2023-03-22 NOTE — ED Triage Notes (Signed)
Pt to ED via POV from [facility]. Pt reports left sided non radiating CP and increased SOB x1 wk. Pt reports normally on 2L [place] at baseline but increased to 3L [place]. KC states oxygen 83-90%. Pt w/ hx COPD. Pt also reports cough.

## 2023-03-22 NOTE — Assessment & Plan Note (Signed)
History of CVA Continue atorvastatin, beta-blocker, Eliquis

## 2023-03-22 NOTE — H&P (Signed)
History and Physical    Patient: Michelle Jenkins NFA:[id number] DOB: Aug 07, 1945 DOA: 03/22/2023 DOS: the patient was seen and examined on 03/22/2023 PCP: Larae Grooms, NP  Patient coming from: Home  Chief Complaint:  Chief Complaint  Patient presents with   Shortness of Breath    HPI: Michelle Jenkins is a 77 y.o. female with medical history significant for A-fib on Eliquis, diastolic CHF, CAD, h/o CVA, OSA on CPAP, morbid obesity, pulmonary artery hypertension followed by pulmonology on ambrisentanon home O2 at 2 L, HTN who presents to the ED with a 1 week history of shortness of breath on exertion.  She has been turning her oxygen up from 2 L to 3 L due to increased shortness of breath.  At the [facility] clinic O2 sats were 83 to 90% on 2 L.  She endorses a cough but denies fever or chills.  Also endorses lower extremity edema..  Denies chest pain and denies lower extremity pain.  She is compliant with her Eliquis. ED course and data review: Occasional tachypnea and satting to the high 90s on 3 L. Labs pertinent for troponin of 12 and BNP 474. Hemoglobin 9.5, down from 11 just 2 weeks prior Creatinine 1.06 which is about baseline WBC normal at 4700 and respiratory viral panel negative for COVID flu and RSV. EKG, personally viewed and interpreted showing A-fib at 79 with nonspecific ST-T wave changes Chest x-ray with enlarged cardiopericardial silhouette and pulmonary venous congestion as follows: IMPRESSION: Both views are technique degraded as detailed above. Marked enlargement of the cardiopericardial silhouette could represent cardiomegaly and/or pericardial effusion. This is relatively similar.  Pulmonary venous congestion without overt congestive failure.  Patient was found to be wheezing in the ED and started on DuoNebs and methylprednisolone and was also administered a dose of IV Lasix Hospitalist consulted for admission.   Review of Systems: As mentioned in the history of  present illness. All other systems reviewed and are negative.  Past Medical History:  Diagnosis Date   Anginal pain (HCC)    Asthma    Bronchitis    CAD (coronary artery disease)    CHF (congestive heart failure) (HCC)    CVA (cerebral infarction)    Dysrhythmia    GERD (gastroesophageal reflux disease)    History of chronic atrial fibrillation    Hypercholesteremia    Hypertension    Idiopathic pulmonary hypertension (HCC)    Myocardial infarction (HCC)    Obesity    OSA (obstructive sleep apnea)    Pre-diabetes    Pulmonary artery hypertension (HCC)    Shortness of breath dyspnea    Sleep apnea    Stroke [facility])    Past Surgical History:  Procedure Laterality Date   BREAST BIOPSY Right 11/15/2015   stereo, COMPLEX SCLEROSING LESION WITH INTRADUCTAL PAPILLOMA COMPONENT AND    BREAST EXCISIONAL BIOPSY Right 02/27/2016   NEG   BREAST LUMPECTOMY WITH NEEDLE LOCALIZATION Right 02/27/2016   Procedure: BREAST LUMPECTOMY WITH NEEDLE LOCALIZATION;  Surgeon: Kieth Brightly, MD;  Location: [facility] ORS;  Service: General;  Laterality: Right;   CARDIAC CATHETERIZATION     CARDIAC CATHETERIZATION Right 12/07/2014   Procedure: Right and Left Heart Cath with Coronary Angiography;  Surgeon: Dalia Heading, MD;  Location: [facility] INVASIVE CV LAB;  Service: Cardiovascular;  Laterality: Right;   CARPAL TUNNEL RELEASE  1998  and 1999   CATARACT EXTRACTION W/PHACO Right 09/24/2019   Procedure: CATARACT EXTRACTION PHACO AND INTRAOCULAR LENS PLACEMENT (IOC) RIGHT;  Surgeon:  Galen Manila, MD;  Location: [facility] ORS;  Service: Ophthalmology;  Laterality: Right;  [id number]:44.8 us6.96 [id number]   COLONOSCOPY  10/31/1998   DILATION AND CURETTAGE OF UTERUS     FOOT SURGERY Right    HYSTEROSCOPY     JOINT REPLACEMENT Left    KNEE ARTHROSCOPY AND ARTHROTOMY Right    lt knee replacement     TONSILLECTOMY     UVULOPALATOPHARYNGOPLASTY     Social History:  reports that she quit smoking about 8 years  ago. Her smoking use included cigarettes. She has never used smokeless tobacco. She reports current alcohol use. She reports that she does not use drugs.  Allergies  Allergen Reactions   Ace Inhibitors Cough   Nyquil Multi-Symptom [Pseudoeph-Doxylamine-Dm-Apap] Itching   Tylenol [Acetaminophen] Itching    Family History  Problem Relation Age of Onset   Diabetes Mother    Alzheimer's disease Mother    Throat cancer Brother    Diabetes Brother    Cancer Brother        lung   Hypertension Brother    Cancer Daughter 5       metastatic uterine PECOMA to the liver and lungs   Rheumatic fever Daughter    Diabetes Sister    Hypertension Sister    COPD Neg Hx    Heart disease Neg Hx    Stroke Neg Hx    Breast cancer Neg Hx     Prior to Admission medications   Medication Sig Start Date End Date Taking? Authorizing Provider  ambrisentan (LETAIRIS) 10 MG tablet Take 10 mg by mouth daily. 05/09/16   [provider]  apixaban (ELIQUIS) 5 MG TABS tablet Take 1 tablet (5 mg total) by mouth 2 (two) times daily. 03/04/23   Larae Grooms, NP  atorvastatin (LIPITOR) 20 MG tablet Take 1 tablet daily 03/04/23   Larae Grooms, NP  ezetimibe (ZETIA) 10 MG tablet Take by mouth daily 03/04/23   Larae Grooms, NP  furosemide (LASIX) 20 MG tablet Take 1 tablet (20 mg total) by mouth 2 (two) times daily. TAKE 1 TABLET(20 MG) BY MOUTH TWICE DAILY AS NEEDED 03/04/23   Larae Grooms, NP  gabapentin (NEURONTIN) 300 MG capsule Take 2 capsules at bedtime. 03/04/23   Larae Grooms, NP  labetalol (NORMODYNE) 200 MG tablet Take 1 tablet by mouth twice daily 03/04/23   Larae Grooms, NP  methocarbamol (ROBAXIN) 500 MG tablet  03/16/22   [provider]  olmesartan (BENICAR) 20 MG tablet Take 1 tablet (20 mg total) by mouth daily. 03/04/23   Larae Grooms, NP  Omega-3 Fatty Acids (FISH OIL PO) Take 1 Dose by mouth daily.    [provider]  ondansetron (ZOFRAN  ODT) 4 MG disintegrating tablet Take 1 tablet (4 mg total) by mouth every 8 (eight) hours as needed for nausea or vomiting. 12/14/20   Larae Grooms, NP  pantoprazole (PROTONIX) 40 MG tablet TAKE 1 TABLET BY MOUTH EVERY DAY 03/04/23   Larae Grooms, NP  potassium chloride (KLOR-CON) 10 MEQ tablet TAKE 1 TABLET(10 MEQ) BY MOUTH EVERY DAY 05/09/20   [provider]  Psyllium (METAMUCIL FIBER PO) Take 1 Dose by mouth. Patient uses Metamucil Powder in the AM    [provider]  VENTOLIN HFA 108 (90 Base) MCG/ACT inhaler  12/02/16   [provider]  XYLIMELTS 550 MG DISK DISSOLVE 1 TABLET ON THE TONGUE EVERY 8 HOURS AS NEEDED FOR DRY MOUTH. 11/26/20   [provider]    Physical  Exam: Vitals:   03/22/23 1900 03/22/23 1930 03/22/23 1934 03/22/23 1945  BP: (!) 132/91 (!) 161/79  (!) 159/76  Pulse: 69 80  88  Resp: (!) 22   20  Temp:      TempSrc:      SpO2: 100% 96%  100%  Weight:   129 kg   Height:   5\' 5"  (1.651 m)    Physical Exam Vitals and nursing note reviewed.  Constitutional:      General: She is not in acute distress. HENT:     Head: Normocephalic and atraumatic.  Cardiovascular:     Rate and Rhythm: Normal rate and regular rhythm.     Heart sounds: Normal heart sounds.  Pulmonary:     Effort: Tachypnea present.     Breath sounds: Normal breath sounds.  Abdominal:     Palpations: Abdomen is soft.     Tenderness: There is no abdominal tenderness.  Neurological:     Mental Status: Mental status is at baseline.     Labs on Admission: I have personally reviewed following labs and imaging studies  CBC: Recent Labs  Lab 03/22/23 1230  WBC 4.7  HGB 9.5*  HCT 30.0*  MCV 101.0*  PLT 150   Basic Metabolic Panel: Recent Labs  Lab 03/22/23 1230  NA 144  K 3.8  CL 102  CO2 33*  GLUCOSE 105*  BUN 14  CREATININE 1.06*  CALCIUM 8.9   GFR: Estimated Creatinine Clearance: 58.3 mL/min (A) (by C-G formula based on SCr of 1.06  mg/dL (H)). Liver Function Tests: No results for input(s): "AST", "ALT", "ALKPHOS", "BILITOT", "PROT", "ALBUMIN" in the last 168 hours. No results for input(s): "LIPASE", "AMYLASE" in the last 168 hours. No results for input(s): "AMMONIA" in the last 168 hours. Coagulation Profile: No results for input(s): "INR", "PROTIME" in the last 168 hours. Cardiac Enzymes: No results for input(s): "CKTOTAL", "CKMB", "CKMBINDEX", "TROPONINI" in the last 168 hours. BNP (last 3 results) No results for input(s): "PROBNP" in the last 8760 hours. HbA1C: No results for input(s): "HGBA1C" in the last 72 hours. CBG: No results for input(s): "GLUCAP" in the last 168 hours. Lipid Profile: No results for input(s): "CHOL", "HDL", "LDLCALC", "TRIG", "CHOLHDL", "LDLDIRECT" in the last 72 hours. Thyroid Function Tests: No results for input(s): "TSH", "T4TOTAL", "FREET4", "T3FREE", "THYROIDAB" in the last 72 hours. Anemia Panel: No results for input(s): "VITAMINB12", "FOLATE", "FERRITIN", "TIBC", "IRON", "RETICCTPCT" in the last 72 hours. Urine analysis:    Component Value Date/Time   APPEARANCEUR Clear 03/04/2023 1052   GLUCOSEU Negative 03/04/2023 1052   BILIRUBINUR Negative 03/04/2023 1052   PROTEINUR Negative 03/04/2023 1052   NITRITE Negative 03/04/2023 1052   LEUKOCYTESUR Negative 03/04/2023 1052    Radiological Exams on Admission: DG Chest 2 View  Result Date: 03/22/2023 CLINICAL DATA:  Chest pain.  Shortness of breath. EXAM: CHEST - 2 VIEW COMPARISON:  03/31/2022 FINDINGS: Lateral view degraded by patient arm position. Frontal view degraded by patient body habitus. Apical lordotic positioning. AP technique. Midline trachea. Marked enlargement of the cardiac/pericardial shadow. No gross pleural effusion. No pneumothorax. Pulmonary venous congestion. No overt congestive failure. No lobar consolidation. IMPRESSION: Both views are technique degraded as detailed above. Marked enlargement of the  cardiopericardial silhouette could represent cardiomegaly and/or pericardial effusion. This is relatively similar. Pulmonary venous congestion without overt congestive failure. Electronically Signed   By: Jeronimo Greaves M.D.   On: 03/22/2023 14:48     Data Reviewed: Relevant notes from primary care  and specialist visits, past discharge summaries as available in EHR, including Care Everywhere. Prior diagnostic testing as pertinent to current admission diagnoses Updated medications and problem lists for reconciliation ED course, including vitals, labs, imaging, treatment and response to treatment Triage notes, nursing and pharmacy notes and ED provider's notes Notable results as noted in HPI   Assessment and Plan: * Acute on chronic diastolic CHF (congestive heart failure) (HCC) Cardiomegaly versus pericardial effusion on chest x-ray Pulmonary vascular congestion on chest x-ray with BNP 474 and large cardiac silhouette on CXR IV Lasix No recent echo.  No recent cardiology follow-up since 2022 Echo ordered Continue olmesartan, add metoprolol Daily weights with intake and output monitor  Acute on chronic respiratory failure with hypoxia (HCC) Likely multifactorial related to exacerbation of CHF and COPD with underlying OSA and PAH as well as concern for enlarged cardiac silhouette (cardiomegaly versus pericardial effusion) Refer to respective problems for management of underlying suspected etiologies Continue supplemental oxygen and wean to baseline as tolerated  COPD with acute exacerbation (HCC) Scheduled and as needed nebulized bronchodilators IV steroids, antitussives Flutter valve Respiratory viral panel was negative.  Anemia Uncertain acuity.  Hemoglobin 9.5 down from 11 a couple weeks prior No reported bleeding Anemia panel Trend H&H Most recent colonoscopy seen was in 2000  Primary pulmonary hypertension (HCC) Obstructive sleep apnea Morbid obesity with BMI 47 Continue  Ambrisentan Continue CPAP nightly Complicating factors to overall prognosis and care Patient follows with pulmonology last seen in May 2024  Atrial fibrillation (HCC) Chronic anticoagulation Continue A-fib and labetalol  Hypertension Continue telmisartan.  Adding metoprolol  CAD in native artery History of CVA Continue atorvastatin, beta-blocker, Eliquis    DVT prophylaxis: eliquis  Consults: cardiology  Advance Care Planning:   Code Status: Prior   Family Communication: none  Disposition Plan: Back to previous home environment  Severity of Illness: The appropriate patient status for this patient is INPATIENT. Inpatient status is judged to be reasonable and necessary in order to provide the required intensity of service to ensure the patient's safety. The patient's presenting symptoms, physical exam findings, and initial radiographic and laboratory data in the context of their chronic comorbidities is felt to place them at high risk for further clinical deterioration. Furthermore, it is not anticipated that the patient will be medically stable for discharge from the hospital within 2 midnights of admission.   * I certify that at the point of admission it is my clinical judgment that the patient will require inpatient hospital care spanning beyond 2 midnights from the point of admission due to high intensity of service, high risk for further deterioration and high frequency of surveillance required.*  Author: Andris Baumann, MD 03/22/2023 8:30 PM  For on call review [email or web address].

## 2023-03-22 NOTE — Assessment & Plan Note (Signed)
Uncertain acuity.  Hemoglobin 9.5 down from 11 a couple weeks prior No reported bleeding Anemia panel Trend H&H Most recent colonoscopy seen was in 2000

## 2023-03-22 NOTE — Assessment & Plan Note (Signed)
Scheduled and as needed nebulized bronchodilators IV steroids, antitussives Flutter valve Respiratory viral panel was negative.

## 2023-03-22 NOTE — ED Notes (Addendum)
Pt reports shob x1 week. Wears 3 L [place] at home. Denies pain. Reports generalized swelling. Denies missing any doses of diuretics.  Alert and oriented. Call bell in reach. Stretcher locked in lowest position

## 2023-03-22 NOTE — ED Provider Notes (Signed)
[facility] Provider Note    Event Date/Time   First MD Initiated Contact with Patient 03/22/23 1803     (approximate)   History   Shortness of Breath   HPI  Michelle Jenkins is a 77 y.o. female with history of COPD on 3 L who comes in with concerns for worsening shortness of breath over the past week.  Patient reports increasing shortness of breath over the past 1 week.  She states that she typically just uses her oxygen with ambulation and uses a sleep machine at nighttime.  She states that is atypical for her to use oxygen at rest because she has been so short of breath she is maybe using it at rest.  She also reports being compliant with her blood thinner, Eliquis that she takes for her A-fib.  She also reports be compliant with her diuretics however she has had increasing swelling in her legs.  Physical Exam   Triage Vital Signs: ED Triage Vitals  Encounter Vitals Group     BP 03/22/23 1253 (!) 143/81     Systolic BP Percentile --      Diastolic BP Percentile --      Pulse Rate 03/22/23 1253 78     Resp 03/22/23 1253 20     Temp 03/22/23 1253 98 F (36.7 C)     Temp Source 03/22/23 1253 Oral     SpO2 03/22/23 1253 96 %     Weight --      Height --      Head Circumference --      Peak Flow --      Pain Score 03/22/23 1254 0     Pain Loc --      Pain Education --      Exclude from Growth Chart --     Most recent vital signs: Vitals:   03/22/23 1253 03/22/23 1626  BP: (!) 143/81 (!) 141/90  Pulse: 78 64  Resp: 20 (!) 21  Temp: 98 F (36.7 C) 98.7 F (37.1 C)  SpO2: 96% 97%     General: Awake, no distress.  CV:  Good peripheral perfusion.  Resp:  Normal effort.  Wheezing noted bilaterally Abd:  No distention.  Other:  1+ edema bilaterally   ED Results / Procedures / Treatments   Labs (all labs ordered are listed, but only abnormal results are displayed) Labs Reviewed  BRAIN NATRIURETIC PEPTIDE - Abnormal; Notable for the  following components:      Result Value   B Natriuretic Peptide 474.5 (*)    All other components within normal limits  BASIC METABOLIC PANEL - Abnormal; Notable for the following components:   CO2 33 (*)    Glucose, Bld 105 (*)    Creatinine, Ser 1.06 (*)    GFR, Estimated 53 (*)    All other components within normal limits  CBC - Abnormal; Notable for the following components:   RBC 2.97 (*)    Hemoglobin 9.5 (*)    HCT 30.0 (*)    MCV 101.0 (*)    All other components within normal limits  RESP PANEL BY RT-PCR (RSV, FLU A&B, COVID)  [id number]  TROPONIN I (HIGH SENSITIVITY)  TROPONIN I (HIGH SENSITIVITY)     EKG  My interpretation of EKG:  Atrial fibrillation with a rate of 79 without any ST elevation or T wave inversions, normal intervals  RADIOLOGY I have reviewed the xray personally and interpreted and patient has some  enlarged heart with some pulmonary vascular congestion  PROCEDURES:  Critical Care performed: No  .1-3 Lead EKG Interpretation  Performed by: Concha Se, MD Authorized by: Concha Se, MD     Interpretation: abnormal     ECG rate:  60   ECG rate assessment: normal     Rhythm: atrial fibrillation     Ectopy: none     Conduction: normal      MEDICATIONS ORDERED IN ED: Medications  ipratropium-albuterol (DUONEB) 0.5-2.5 (3) MG/3ML nebulizer solution 3 mL (3 mLs Nebulization Given 03/22/23 1834)  ipratropium-albuterol (DUONEB) 0.5-2.5 (3) MG/3ML nebulizer solution 3 mL (3 mLs Nebulization Given 03/22/23 1834)  ipratropium-albuterol (DUONEB) 0.5-2.5 (3) MG/3ML nebulizer solution 3 mL (3 mLs Nebulization Given 03/22/23 1834)  methylPREDNISolone sodium succinate (SOLU-MEDROL) 125 mg/2 mL injection 125 mg (125 mg Intravenous Given 03/22/23 1830)  furosemide (LASIX) injection 40 mg (40 mg Intravenous Given 03/22/23 1832)     IMPRESSION / MDM / ASSESSMENT AND PLAN / ED COURSE  I reviewed the triage vital signs and the nursing notes.   Patient's  presentation is most consistent with acute presentation with potential threat to life or bodily function.   Patient comes in with known A-fib but currently in A-fib but rate controlled on Eliquis so doubt PE with worsening shortness of breath.  Suspect CHF versus COPD given examination.  Cardiac markers ordered evaluate for ACS  Trop X 2.  COVID, flu are negative.  BNP is elevated and BMP shows stable creatinine.  CBC shows slight drop in her hemoglobin to 9.5.  Stray is concerning for some pulmonary venous congestion and cardiomegaly.  I do not see the patient had a recent echocardiogram.  The patient was given dose of Lasix and treatments.  She reports feeling better we discussed admission versus going home but she did not feel comfortable going home.  She reports continued shortness of breath with ambulation therefore will discuss with hospital team for admission.  The patient is on the cardiac monitor to evaluate for evidence of arrhythmia and/or significant heart rate changes.      FINAL CLINICAL IMPRESSION(S) / ED DIAGNOSES   Final diagnoses:  COPD exacerbation (HCC)  Acute on chronic congestive heart failure, unspecified heart failure type (HCC)     Rx / DC Orders   ED Discharge Orders     None        Note:  This document was prepared using Dragon voice recognition software and may include unintentional dictation errors.   Concha Se, MD 03/22/23 [id number]

## 2023-03-22 NOTE — ED Notes (Signed)
Patient ambulated to restroom in the room and back to bed without incident, no decrease in O2 sats or work of breathing. Patient endorsed feeling okay getting up and getting back to bed. Patient endorses being worried of going home and getting worse. Notified Funke, MD that she may need to go speak with patient and family.

## 2023-03-23 ENCOUNTER — Observation Stay
Admit: 2023-03-23 | Discharge: 2023-03-23 | Disposition: A | Discharge status: Home / Self Care | Payer: 59 | Attending: Internal Medicine | Admitting: Internal Medicine

## 2023-03-23 ENCOUNTER — Observation Stay
Admit: 2023-03-23 | Discharge: 2023-03-23 | Disposition: A | Discharge status: Home / Self Care | Payer: 59 | Attending: Internal Medicine

## 2023-03-23 ENCOUNTER — Observation Stay: Payer: 59

## 2023-03-23 DIAGNOSIS — I5033 Acute on chronic diastolic (congestive) heart failure: Secondary | ICD-10-CM | POA: Diagnosis not present

## 2023-03-23 LAB — BASIC METABOLIC PANEL
Anion gap: 12 (ref 5–15)
BUN: 16 mg/dL (ref 8–23)
CO2: 32 mmol/L (ref 22–32)
Calcium: 8.7 mg/dL — ABNORMAL LOW (ref 8.9–10.3)
Chloride: 98 mmol/L (ref 98–111)
Creatinine, Ser: 0.98 mg/dL (ref 0.44–1.00)
GFR, Estimated: 59 mL/min — ABNORMAL LOW (ref >60)
Glucose, Bld: 141 mg/dL — ABNORMAL HIGH (ref 70–99)
Potassium: 3.6 mmol/L (ref 3.5–5.1)
Sodium: 142 mmol/L (ref 135–145)

## 2023-03-23 LAB — CBC
HCT: 31 % — ABNORMAL LOW (ref 36.0–46.0)
Hemoglobin: 10.1 g/dL — ABNORMAL LOW (ref 12.0–15.0)
MCH: 32.3 pg (ref 26.0–34.0)
MCHC: 32.6 g/dL (ref 30.0–36.0)
MCV: 99 fL (ref 80.0–100.0)
Platelets: 164 10*3/uL (ref 150–400)
RBC: 3.13 MIL/uL — ABNORMAL LOW (ref 3.87–5.11)
RDW: 14.8 % (ref 11.5–15.5)
WBC: 5.1 10*3/uL (ref 4.0–10.5)
nRBC: 0 % (ref 0.0–0.2)

## 2023-03-23 LAB — MAGNESIUM: Magnesium: 2.1 mg/dL (ref 1.7–2.4)

## 2023-03-23 LAB — PHOSPHORUS: Phosphorus: 4 mg/dL (ref 2.5–4.6)

## 2023-03-23 MED ORDER — POTASSIUM CHLORIDE CRYS ER 20 MEQ PO TBCR
40.0000 meq | EXTENDED_RELEASE_TABLET | Freq: Once | ORAL | Status: AC
  Administered 2023-03-23: 40 meq via ORAL
  Filled 2023-03-23: qty 2

## 2023-03-23 NOTE — Progress Notes (Signed)
  Echocardiogram 2D Echocardiogram has been performed.  Michelle Jenkins 03/23/2023, 8:44 PM

## 2023-03-23 NOTE — Consult Note (Signed)
[facility] CLINIC CARDIOLOGY CONSULT NOTE       Patient ID: [name] MRN: [id number] DOB/AGE: 79-Nov-1947 79 y.o.  Admit date: 03/22/2023 Referring Physician Dr Lucianne Muss Primary Physician Larae Grooms, NP Primary Cardiologist kc Reason for Consultation AECHF  HPI: Michelle Jenkins is a 77 y.o. female  with a past medical history of A-fib on Eliquis, HFpE, CAD, h/o CVA, OSA on CPAP, PAH on ambrisentanon and home o2 at 2 L  who presented to the ED on 03/22/2023 for DOE. Patient has been having increasing oxygen demands with hypoxia on pulse ox. Additionally, she is complaining of LE edema, orthopnea, and PND. Patient is compliant with her Eliquis and has not missed any doses.   Review of systems complete and found to be negative unless listed above     Past Medical History:  Diagnosis Date   Anginal pain (HCC)    Asthma    Bronchitis    CAD (coronary artery disease)    CHF (congestive heart failure) (HCC)    CVA (cerebral infarction)    Dysrhythmia    GERD (gastroesophageal reflux disease)    History of chronic atrial fibrillation    Hypercholesteremia    Hypertension    Idiopathic pulmonary hypertension (HCC)    Myocardial infarction (HCC)    Obesity    OSA (obstructive sleep apnea)    Pre-diabetes    Pulmonary artery hypertension (HCC)    Shortness of breath dyspnea    Sleep apnea    Stroke [facility])     Past Surgical History:  Procedure Laterality Date   BREAST BIOPSY Right 11/15/2015   stereo, COMPLEX SCLEROSING LESION WITH INTRADUCTAL PAPILLOMA COMPONENT AND    BREAST EXCISIONAL BIOPSY Right 02/27/2016   NEG   BREAST LUMPECTOMY WITH NEEDLE LOCALIZATION Right 02/27/2016   Procedure: BREAST LUMPECTOMY WITH NEEDLE LOCALIZATION;  Surgeon: Kieth Brightly, MD;  Location: [facility] ORS;  Service: General;  Laterality: Right;   CARDIAC CATHETERIZATION     CARDIAC CATHETERIZATION Right 12/07/2014   Procedure: Right and Left Heart Cath with Coronary Angiography;   Surgeon: Dalia Heading, MD;  Location: [facility] INVASIVE CV LAB;  Service: Cardiovascular;  Laterality: Right;   CARPAL TUNNEL RELEASE  1998  and 1999   CATARACT EXTRACTION W/PHACO Right 09/24/2019   Procedure: CATARACT EXTRACTION PHACO AND INTRAOCULAR LENS PLACEMENT (IOC) RIGHT;  Surgeon: Galen Manila, MD;  Location: [facility] ORS;  Service: Ophthalmology;  Laterality: Right;  [id number]:44.8 us6.96 [id number]   COLONOSCOPY  10/31/1998   DILATION AND CURETTAGE OF UTERUS     FOOT SURGERY Right    HYSTEROSCOPY     JOINT REPLACEMENT Left    KNEE ARTHROSCOPY AND ARTHROTOMY Right    lt knee replacement     TONSILLECTOMY     UVULOPALATOPHARYNGOPLASTY      Medications Prior to Admission  Medication Sig Dispense Refill Last Dose   ambrisentan (LETAIRIS) 10 MG tablet Take 10 mg by mouth daily.      apixaban (ELIQUIS) 5 MG TABS tablet Take 1 tablet (5 mg total) by mouth 2 (two) times daily. 180 tablet 1    atorvastatin (LIPITOR) 20 MG tablet Take 1 tablet daily 90 tablet 1    ezetimibe (ZETIA) 10 MG tablet Take by mouth daily 90 tablet 1    furosemide (LASIX) 20 MG tablet Take 1 tablet (20 mg total) by mouth 2 (two) times daily. TAKE 1 TABLET(20 MG) BY MOUTH TWICE DAILY AS NEEDED 180 tablet 1    gabapentin (NEURONTIN) 300 MG  capsule Take 2 capsules at bedtime. 180 capsule 3    labetalol (NORMODYNE) 200 MG tablet Take 1 tablet by mouth twice daily 180 tablet 1    methocarbamol (ROBAXIN) 500 MG tablet       olmesartan (BENICAR) 20 MG tablet Take 1 tablet (20 mg total) by mouth daily. 90 tablet 1    Omega-3 Fatty Acids (FISH OIL PO) Take 1 Dose by mouth daily.      ondansetron (ZOFRAN ODT) 4 MG disintegrating tablet Take 1 tablet (4 mg total) by mouth every 8 (eight) hours as needed for nausea or vomiting. 20 tablet 0    pantoprazole (PROTONIX) 40 MG tablet TAKE 1 TABLET BY MOUTH EVERY DAY 180 tablet 1    potassium chloride (KLOR-CON) 10 MEQ tablet TAKE 1 TABLET(10 MEQ) BY MOUTH EVERY DAY      Psyllium  (METAMUCIL FIBER PO) Take 1 Dose by mouth. Patient uses Metamucil Powder in the AM      VENTOLIN HFA 108 (90 Base) MCG/ACT inhaler       XYLIMELTS 550 MG DISK DISSOLVE 1 TABLET ON THE TONGUE EVERY 8 HOURS AS NEEDED FOR DRY MOUTH.      Social History   Socioeconomic History   Marital status: Single    Spouse name: Not on file   Number of children: Not on file   Years of education: Not on file   Highest education level: 8th grade  Occupational History   Occupation: retired  Tobacco Use   Smoking status: Former    Current packs/day: 0.00    Types: Cigarettes    Quit date: 06/15/2014    Years since quitting: 8.7   Smokeless tobacco: Never   Tobacco comments:    4-5 cigarettes a day when she smoked  Vaping Use   Vaping status: Never Used  Substance and Sexual Activity   Alcohol use: Yes    Alcohol/week: 0.0 - 1.0 standard drinks of alcohol    Comment: occasionally   Drug use: No   Sexual activity: Not Currently  Other Topics Concern   Not on file  Social History Narrative   Not on file   Social Determinants of Health   Financial Resource Strain: Low Risk  (02/22/2021)   Overall Financial Resource Strain (CARDIA)    Difficulty of Paying Living Expenses: Not very hard  Food Insecurity: No Food Insecurity (03/22/2023)   Hunger Vital Sign    Worried About Running Out of Food in the Last Year: Never true    Ran Out of Food in the Last Year: Never true  Transportation Needs: No Transportation Needs (03/22/2023)   PRAPARE - Administrator, Civil Service (Medical): No    Lack of Transportation (Non-Medical): No  Physical Activity: Inactive (02/22/2021)   Exercise Vital Sign    Days of Exercise per Week: 0 days    Minutes of Exercise per Session: 0 min  Stress: No Stress Concern Present (02/22/2021)   [employer] of Occupational Health - Occupational Stress Questionnaire    Feeling of Stress : Not at all  Social Connections: Unknown (02/22/2021)   Social Connection  and Isolation Panel [NHANES]    Frequency of Communication with Friends and Family: Three times a week    Frequency of Social Gatherings with Friends and Family: Three times a week    Attends Religious Services: 1 to 4 times per year    Active Member of Clubs or Organizations: No    Attends Banker Meetings:  Never    Marital Status: Not on file  Intimate Partner Violence: Not At Risk (03/22/2023)   Humiliation, Afraid, Rape, and Kick questionnaire    Fear of Current or Ex-Partner: No    Emotionally Abused: No    Physically Abused: No    Sexually Abused: No    Family History  Problem Relation Age of Onset   Diabetes Mother    Alzheimer's disease Mother    Throat cancer Brother    Diabetes Brother    Cancer Brother        lung   Hypertension Brother    Cancer Daughter 36       metastatic uterine PECOMA to the liver and lungs   Rheumatic fever Daughter    Diabetes Sister    Hypertension Sister    COPD Neg Hx    Heart disease Neg Hx    Stroke Neg Hx    Breast cancer Neg Hx      Vitals:   03/23/23 0022 03/23/23 0313 03/23/23 0800 03/23/23 0849  BP: 131/69 (!) 155/77 (!) 149/77   Pulse: 82 81 (!) 57 80  Resp: 20 20 16 18   Temp: 97.9 F (36.6 C) 98.1 F (36.7 C) 97.9 F (36.6 C)   TempSrc:  Oral    SpO2: 100% 92% 98% 94%  Weight:      Height:        PHYSICAL EXAM General: awake, well nourished, in no acute distress. HEENT: Normocephalic and atraumatic. Neck: No JVD.  Lungs: Normal respiratory effort. Clear bilaterally to auscultation. No wheezes, crackles, rhonchi.  Heart: HRRR. Normal S1 and S2 without gallops or murmurs.  Abdomen: Non-distended appearing.  Msk: Normal strength and tone for age. Extremities: Warm and well perfused. No clubbing, cyanosis. 2+ edema.  Neuro: Alert and oriented X 3. Psych: Answers questions appropriately.   Labs: Basic Metabolic Panel: Recent Labs    03/22/23 1230 03/23/23 0527  NA 144 142  K 3.8 3.6  CL 102 98   CO2 33* 32  GLUCOSE 105* 141*  BUN 14 16  CREATININE 1.06* 0.98  CALCIUM 8.9 8.7*  MG  --  2.1  PHOS  --  4.0   Liver Function Tests: Recent Labs    03/22/23 2117  AST 29  ALT 31  ALKPHOS 59  BILITOT 1.0  PROT 7.3  ALBUMIN 4.1   No results for input(s): "LIPASE", "AMYLASE" in the last 72 hours. CBC: Recent Labs    03/22/23 1230 03/23/23 0527  WBC 4.7 5.1  HGB 9.5* 10.1*  HCT 30.0* 31.0*  MCV 101.0* 99.0  PLT 150 164   Cardiac Enzymes: Recent Labs    03/22/23 1230 03/22/23 1632  TROPONINIHS 12 12   BNP: Recent Labs    03/22/23 1230  BNP 474.5*   D-Dimer: No results for input(s): "DDIMER" in the last 72 hours. Hemoglobin A1C: No results for input(s): "HGBA1C" in the last 72 hours. Fasting Lipid Panel: No results for input(s): "CHOL", "HDL", "LDLCALC", "TRIG", "CHOLHDL", "LDLDIRECT" in the last 72 hours. Thyroid Function Tests: No results for input(s): "TSH", "T4TOTAL", "T3FREE", "THYROIDAB" in the last 72 hours.  Invalid input(s): "FREET3" Anemia Panel: Recent Labs    03/22/23 2117  VITAMINB12 407  FOLATE 15.4  FERRITIN 74  TIBC 297  IRON 43  RETICCTPCT 2.1     Radiology: DG Chest 2 View  Result Date: 03/22/2023 CLINICAL DATA:  Chest pain.  Shortness of breath. EXAM: CHEST - 2 VIEW COMPARISON:  03/31/2022 FINDINGS: Lateral  view degraded by patient arm position. Frontal view degraded by patient body habitus. Apical lordotic positioning. AP technique. Midline trachea. Marked enlargement of the cardiac/pericardial shadow. No gross pleural effusion. No pneumothorax. Pulmonary venous congestion. No overt congestive failure. No lobar consolidation. IMPRESSION: Both views are technique degraded as detailed above. Marked enlargement of the cardiopericardial silhouette could represent cardiomegaly and/or pericardial effusion. This is relatively similar. Pulmonary venous congestion without overt congestive failure. Electronically Signed   By: Jeronimo Greaves  M.D.   On: 03/22/2023 14:48    ECHO pending  TELEMETRY reviewed by me [facility]) 03/23/2023 : Afib CVR  EKG reviewed by me: Afib, rate controlled at 79 bpm. Non-specific ST & T wave abnl  Data reviewed by me [facility]) 03/23/2023: last 24h vitals tele labs imaging I/O provider notes  Principal Problem:   Acute on chronic diastolic CHF (congestive heart failure) (HCC) Active Problems:   Obstructive apnea   Primary pulmonary hypertension (HCC)   CAD in native artery   Hypertension   Atrial fibrillation (HCC)   Cardiomegaly vs pericardial effusion on CXR   COPD with acute exacerbation (HCC)   Acute on chronic respiratory failure with hypoxia (HCC)   Obesity, Class III, BMI 40-49.9 (morbid obesity) (HCC)   Chronic anticoagulation   Anemia   History of CVA (cerebrovascular accident)    ASSESSMENT AND PLAN:   HFpEF, currently decompensated Continue IV diuresis at this time. Could change to PO Lasix on Monday is significant net negative fluid balance over weekend. Her creatinine appears to be at baseline. Echocardiogram pending. Optimize lytes, K>4 and Mag>2. Low sodium diet, strict I&Os. Trend daily weights and fluid intake.   Afib CVR Continue Eliquis and BB Monitor on telemetry   Hypertension CAD H/o CVA No evidence of ACS at this time Restart home cardiac meds   A/C hypoxic respiratory failure likely multifactorial Patient has PAH, COPD and OSA in addition to AECHF at this time. Would appreciate pulmonology recommendations    Signed: Clotilde Dieter, DO 03/23/2023, 10:30 AM [facility] Cardiology

## 2023-03-23 NOTE — Progress Notes (Signed)
Monitoring by Pharmacy for Pulmonary Hypertension Treatment   Indication - Continuation of prior to admission medication   Patient is 77 y.o.  with history of PAH on chronic ambrisentan (LETAIRIS) PTA and will be continued while hospitalized.   Continuing this medication order as an inpatient requires that monitoring parameters per REMS requirements must be met.  Chronic therapy is under the supervision of [employer] (?) (add provider name) who is enrolled in the REMS program (REMS Provider ID ) and is being notified of continuation of therapy. A staff message in EPIC has been sent notifying the certified prescriber.  The patient's authorization code or REMS patient ID as provided by the REMS program is . Per patient report has previously been educated on Pregnancy risk and Hepatotoxicity.  On admission pregnancy risk has been assessed and Patient is female and cannot get pregnant, and birth control option is appropriately not indicated as an option has been confirmed as birth control option for this patient.  Hepatic function has been evaluated. AST / ALT appropriate to continue medication at this time.     Latest Ref Rng & Units 03/22/2023    9:17 PM 03/04/2023   10:52 AM 10/02/2022    3:44 PM  Hepatic Function  Total Protein 6.5 - 8.1 g/dL 7.3  6.4  6.6   Albumin 3.5 - 5.0 g/dL 4.1  4.3  4.5   AST 15 - 41 U/L 29  20  16    ALT 0 - 44 U/L 31  11  10    Alk Phosphatase 38 - 126 U/L 59  62  69   Total Bilirubin <1.2 mg/dL 1.0  0.7  0.4   Bilirubin, Direct 0.0 - 0.2 mg/dL 0.2       If any question arise or pregnancy is identified during hospitalization, contact for bosentan: [phone]; macitentan: [phone]; ambrisentan: [phone].  Thank for you allowing [place] to participate in the care of this patient.  Juelz Claar D 03/23/2023, 12:53 AM Clinical Pharmacist  Guides for Female Patient:  ambrisentan (LETAIRIS), macitentan (OPSUMIT), bosentan (TRACLEER).

## 2023-03-23 NOTE — Plan of Care (Signed)

## 2023-03-23 NOTE — Progress Notes (Signed)
Echo attempted. Patient unavailable.   Dondra Prader RVT RCS

## 2023-03-23 NOTE — Plan of Care (Signed)
  Problem: Education: Goal: Knowledge of General Education information will improve Description: Including pain rating scale, medication(s)/side effects and non-pharmacologic comfort measures Outcome: Progressing   Problem: Health Behavior/Discharge Planning: Goal: Ability to manage health-related needs will improve Outcome: Progressing   Problem: Clinical Measurements: Goal: Ability to maintain clinical measurements within normal limits will improve Outcome: Progressing Goal: Will remain free from infection Outcome: Progressing Goal: Diagnostic test results will improve Outcome: Progressing Goal: Respiratory complications will improve Outcome: Progressing Goal: Cardiovascular complication will be avoided Outcome: Progressing   Problem: Activity: Goal: Risk for activity intolerance will decrease Outcome: Progressing   Problem: Nutrition: Goal: Adequate nutrition will be maintained Outcome: Progressing   Problem: Coping: Goal: Level of anxiety will decrease Outcome: Progressing   Problem: Elimination: Goal: Will not experience complications related to bowel motility Outcome: Progressing Goal: Will not experience complications related to urinary retention Outcome: Progressing   Problem: Pain Management: Goal: General experience of comfort will improve Outcome: Progressing   Problem: Safety: Goal: Ability to remain free from injury will improve Outcome: Progressing   Problem: Skin Integrity: Goal: Risk for impaired skin integrity will decrease Outcome: Progressing   Problem: Education: Goal: Ability to demonstrate management of disease process will improve Outcome: Progressing Goal: Ability to verbalize understanding of medication therapies will improve Outcome: Progressing Goal: Individualized Educational Video(s) Outcome: Progressing   Problem: Activity: Goal: Capacity to carry out activities will improve Outcome: Progressing   Problem: Cardiac: Goal:  Ability to achieve and maintain adequate cardiopulmonary perfusion will improve Outcome: Progressing   Problem: Education: Goal: Knowledge of disease or condition will improve Outcome: Progressing Goal: Knowledge of the prescribed therapeutic regimen will improve Outcome: Progressing Goal: Individualized Educational Video(s) Outcome: Progressing   Problem: Activity: Goal: Ability to tolerate increased activity will improve Outcome: Progressing Goal: Will verbalize the importance of balancing activity with adequate rest periods Outcome: Progressing   Problem: Respiratory: Goal: Ability to maintain a clear airway will improve Outcome: Progressing Goal: Levels of oxygenation will improve Outcome: Progressing Goal: Ability to maintain adequate ventilation will improve Outcome: Progressing

## 2023-03-23 NOTE — Progress Notes (Signed)
[facility] Progress Note  Patient: Michelle Jenkins    HYQ:[id number]  DOA: 03/22/2023     Date of Service: the patient was seen and examined on 03/23/2023  Chief Complaint  Patient presents with   Shortness of Breath   Brief hospital course: Michelle Jenkins is a 77 y.o. female with medical history significant for A-fib on Eliquis, diastolic CHF, CAD, h/o CVA, OSA on CPAP, morbid obesity, pulmonary artery hypertension followed by pulmonology on ambrisentanon home O2 at 2 L, HTN who presents to the ED with a 1 week history of shortness of breath on exertion.  She has been turning her oxygen up from 2 L to 3 L due to increased shortness of breath.  At the [facility] clinic O2 sats were 83 to 90% on 2 L.  She endorses a cough but denies fever or chills.  Also endorses lower extremity edema..  Denies chest pain and denies lower extremity pain.  She is compliant with her Eliquis. ED course and data review: Occasional tachypnea and satting to the high 90s on 3 L. Labs pertinent for troponin of 12 and BNP 474. Hemoglobin 9.5, down from 11 just 2 weeks prior Creatinine 1.06 which is about baseline WBC normal at 4700 and respiratory viral panel negative for COVID flu and RSV. EKG, personally viewed and interpreted showing A-fib at 79 with nonspecific ST-T wave changes Chest x-ray with enlarged cardiopericardial silhouette and pulmonary venous congestion as follows: IMPRESSION: Both views are technique degraded as detailed above. Marked enlargement of the cardiopericardial silhouette could represent cardiomegaly and/or pericardial effusion. This is relatively similar. Pulmonary venous congestion without overt congestive failure.   Patient was found to be wheezing in the ED and started on DuoNebs and methylprednisolone and was also administered a dose of IV Lasix Hospitalist consulted for admission.    Assessment and Plan: Acute on chronic diastolic CHF (congestive heart failure)  (HCC) Cardiomegaly versus pericardial effusion on chest x-ray Pulmonary vascular congestion on chest x-ray with BNP 474 and large cardiac silhouette on CXR Continue IV Lasix 40 mg every 12 hourly No recent echo.  No recent cardiology follow-up since 2022 Echo ordered Continue olmesartan, add metoprolol Daily weights with intake and output monitor Cardiology consult appreciated  Acute on chronic respiratory failure with hypoxia (HCC) Likely multifactorial related to exacerbation of CHF and COPD with underlying OSA and PAH as well as concern for enlarged cardiac silhouette (cardiomegaly versus pericardial effusion) Refer to respective problems for management of underlying suspected etiologies Continue supplemental oxygen and wean to baseline as tolerated CT chest: . Right lower lobe consolidation. Follow up to confirm resolution recommended. 2. Cardiomegaly. Small pericardial effusion. 3. Aortic and coronary artery atherosclerosis Clinically no signs of pneumonia, monitor off antibiotics for now  COPD with acute exacerbation (HCC) Scheduled and as needed nebulized bronchodilators IV steroids, antitussives Flutter valve Respiratory viral panel was negative.   Anemia Uncertain acuity.  Hemoglobin 9.5 down from 11 a couple weeks prior No reported bleeding Anemia panel iron, folate and B12 within normal range Trend H&H Most recent colonoscopy seen was in 2000   Primary pulmonary hypertension (HCC) Obstructive sleep apnea Morbid obesity with BMI 47 Continue Ambrisentan Continue CPAP nightly Complicating factors to overall prognosis and care Patient follows with pulmonology last seen in May 2024   Atrial fibrillation (HCC) Chronic anticoagulation Continue A-fib and labetalol   Hypertension Continue telmisartan.  Adding metoprolol   CAD in native artery History of CVA Continue atorvastatin, beta-blocker, Eliquis  Morbid obesity Body mass index is 51.32 kg/m.   Interventions:  Diet: Heart healthy diet DVT Prophylaxis: Therapeutic Anticoagulation with Eliquis    Advance goals of care discussion: Full code  Family Communication: family was not present at bedside, at the time of interview.  The pt provided permission to discuss medical plan with the family. Opportunity was given to ask question and all questions were answered satisfactorily.   Disposition:  Pt is from Home, admitted with shortness of breath, dyspnea on exertion, still has shortness of breath, which precludes a safe discharge. Discharge to home, when stable and cleared by cardiology.  Subjective: No significant events overnight, patient still has his on exertion, short of breath, lower extremity edema.  Denies any chest pain or palpitations.   Physical Exam: General: NAD, lying comfortably Appear in no distress, affect appropriate Eyes: PERRLA ENT: Oral Mucosa Clear, moist  Neck: no JVD,  Cardiovascular: S1 and S2 Present, no Murmur,  Respiratory: good respiratory effort, Bilateral Air entry equal and Decreased, no Crackles, no wheezes Abdomen: Bowel Sound present, Soft and no tenderness,  Skin: no rashes Extremities: 3-4+ Pedal edema, no calf tenderness Neurologic: without any new focal findings Gait not checked due to patient safety concerns  Vitals:   03/23/23 0313 03/23/23 0800 03/23/23 0849 03/23/23 1330  BP: (!) 155/77 (!) 149/77  (!) 142/72  Pulse: 81 (!) 57 80 (!) 59  Resp: 20 16 18 17   Temp: 98.1 F (36.7 C) 97.9 F (36.6 C)  97.6 F (36.4 C)  TempSrc: Oral     SpO2: 92% 98% 94% 98%  Weight:      Height:        Intake/Output Summary (Last 24 hours) at 03/23/2023 1528 Last data filed at 03/23/2023 0700 Gross per 24 hour  Intake 600 ml  Output 2375 ml  Net -1775 ml   Filed Weights   03/22/23 1934 03/22/23 2041  Weight: 129 kg (!) 139.9 kg    Data Reviewed: I have personally reviewed and interpreted daily labs, tele strips, imagings as discussed  above. I reviewed all nursing notes, pharmacy notes, vitals, pertinent old records I have discussed plan of care as described above with RN and patient/family.  CBC: Recent Labs  Lab 03/22/23 1230 03/23/23 0527  WBC 4.7 5.1  HGB 9.5* 10.1*  HCT 30.0* 31.0*  MCV 101.0* 99.0  PLT 150 164   Basic Metabolic Panel: Recent Labs  Lab 03/22/23 1230 03/23/23 0527  NA 144 142  K 3.8 3.6  CL 102 98  CO2 33* 32  GLUCOSE 105* 141*  BUN 14 16  CREATININE 1.06* 0.98  CALCIUM 8.9 8.7*  MG  --  2.1  PHOS  --  4.0    Studies: CT CHEST WO CONTRAST  Result Date: 03/23/2023 CLINICAL DATA:  Dyspnea. EXAM: CT CHEST WITHOUT CONTRAST TECHNIQUE: Multidetector CT imaging of the chest was performed following the standard protocol without IV contrast. RADIATION DOSE REDUCTION: This exam was performed according to the departmental dose-optimization program which includes automated exposure control, adjustment of the mA and/or kV according to patient size and/or use of iterative reconstruction technique. COMPARISON:  None Available. FINDINGS: Cardiovascular: Cardiomegaly. Small pericardial effusion. Atheromatous calcifications of the aorta and coronary arteries. Mediastinum/Nodes: Postop changes consistent with right thyroidectomy. No suspicious mediastinal, hilar or axillary adenopathy identified. Right paratracheal 9 mm node seen. Lungs/Pleura: Dependent subsegmental atelectasis at the lung bases. Alveolar consolidation right lower lobe could represent a pneumonia. Follow up to confirm resolution recommended. Upper  Abdomen: No acute abnormality. Musculoskeletal: No chest wall mass or suspicious bone lesions identified. IMPRESSION: 1. Right lower lobe consolidation. Follow up to confirm resolution recommended. 2. Cardiomegaly. Small pericardial effusion. 3. Aortic and coronary artery atherosclerosis (ICD10-I70.0). Electronically Signed   By: Layla Maw M.D.   On: 03/23/2023 10:41    Scheduled Meds:   ambrisentan  10 mg Oral Daily   apixaban  5 mg Oral BID   atorvastatin  20 mg Oral Daily   furosemide  40 mg Intravenous Q12H   guaiFENesin  600 mg Oral BID   irbesartan  75 mg Oral Daily   methylPREDNISolone (SOLU-MEDROL) injection  40 mg Intravenous Q12H   Followed by   Melene Muller ON 03/24/2023] predniSONE  40 mg Oral Q breakfast   metoprolol tartrate  12.5 mg Oral BID   Continuous Infusions: PRN Meds: acetaminophen **OR** acetaminophen, albuterol, HYDROcodone-acetaminophen, morphine injection, ondansetron **OR** ondansetron (ZOFRAN) IV  Time spent: 35 minutes  Author: Gillis Santa. MD [facility] 03/23/2023 3:28 PM  To reach On-call, see care teams to locate the attending and reach out to them via [email or web address]. If 7PM-7AM, please contact night-coverage If you still have difficulty reaching the attending provider, please page the [facility] (Director on Call) for [facility] on amion for assistance.

## 2023-03-24 DIAGNOSIS — Z8249 Family history of ischemic heart disease and other diseases of the circulatory system: Secondary | ICD-10-CM | POA: Diagnosis not present

## 2023-03-24 DIAGNOSIS — Z7901 Long term (current) use of anticoagulants: Secondary | ICD-10-CM | POA: Diagnosis not present

## 2023-03-24 DIAGNOSIS — D638 Anemia in other chronic diseases classified elsewhere: Secondary | ICD-10-CM | POA: Diagnosis present

## 2023-03-24 DIAGNOSIS — E7800 Pure hypercholesterolemia, unspecified: Secondary | ICD-10-CM | POA: Diagnosis present

## 2023-03-24 DIAGNOSIS — I110 Hypertensive heart disease with heart failure: Secondary | ICD-10-CM | POA: Diagnosis present

## 2023-03-24 DIAGNOSIS — Z7984 Long term (current) use of oral hypoglycemic drugs: Secondary | ICD-10-CM | POA: Diagnosis not present

## 2023-03-24 DIAGNOSIS — Z96652 Presence of left artificial knee joint: Secondary | ICD-10-CM | POA: Diagnosis present

## 2023-03-24 DIAGNOSIS — R0602 Shortness of breath: Secondary | ICD-10-CM | POA: Diagnosis present

## 2023-03-24 DIAGNOSIS — I252 Old myocardial infarction: Secondary | ICD-10-CM | POA: Diagnosis not present

## 2023-03-24 DIAGNOSIS — Z6841 Body Mass Index (BMI) 40.0 and over, adult: Secondary | ICD-10-CM | POA: Diagnosis not present

## 2023-03-24 DIAGNOSIS — I482 Chronic atrial fibrillation, unspecified: Secondary | ICD-10-CM | POA: Diagnosis present

## 2023-03-24 DIAGNOSIS — F17210 Nicotine dependence, cigarettes, uncomplicated: Secondary | ICD-10-CM | POA: Diagnosis present

## 2023-03-24 DIAGNOSIS — I5033 Acute on chronic diastolic (congestive) heart failure: Secondary | ICD-10-CM | POA: Diagnosis present

## 2023-03-24 DIAGNOSIS — E66813 Obesity, class 3: Secondary | ICD-10-CM | POA: Diagnosis present

## 2023-03-24 DIAGNOSIS — I2721 Secondary pulmonary arterial hypertension: Secondary | ICD-10-CM | POA: Diagnosis present

## 2023-03-24 DIAGNOSIS — I2510 Atherosclerotic heart disease of native coronary artery without angina pectoris: Secondary | ICD-10-CM | POA: Diagnosis present

## 2023-03-24 DIAGNOSIS — Z79899 Other long term (current) drug therapy: Secondary | ICD-10-CM | POA: Diagnosis not present

## 2023-03-24 DIAGNOSIS — Z8673 Personal history of transient ischemic attack (TIA), and cerebral infarction without residual deficits: Secondary | ICD-10-CM | POA: Diagnosis not present

## 2023-03-24 DIAGNOSIS — G4733 Obstructive sleep apnea (adult) (pediatric): Secondary | ICD-10-CM | POA: Diagnosis present

## 2023-03-24 DIAGNOSIS — Q2112 Patent foramen ovale: Secondary | ICD-10-CM | POA: Diagnosis not present

## 2023-03-24 DIAGNOSIS — J9621 Acute and chronic respiratory failure with hypoxia: Secondary | ICD-10-CM | POA: Diagnosis present

## 2023-03-24 DIAGNOSIS — J441 Chronic obstructive pulmonary disease with (acute) exacerbation: Secondary | ICD-10-CM | POA: Diagnosis present

## 2023-03-24 DIAGNOSIS — Z1152 Encounter for screening for COVID-19: Secondary | ICD-10-CM | POA: Diagnosis not present

## 2023-03-24 DIAGNOSIS — I3139 Other pericardial effusion (noninflammatory): Secondary | ICD-10-CM | POA: Diagnosis present

## 2023-03-24 LAB — CBC
HCT: 29.4 % — ABNORMAL LOW (ref 36.0–46.0)
Hemoglobin: 10 g/dL — ABNORMAL LOW (ref 12.0–15.0)
MCH: 32.8 pg (ref 26.0–34.0)
MCHC: 34 g/dL (ref 30.0–36.0)
MCV: 96.4 fL (ref 80.0–100.0)
Platelets: 183 10*3/uL (ref 150–400)
RBC: 3.05 MIL/uL — ABNORMAL LOW (ref 3.87–5.11)
RDW: 15 % (ref 11.5–15.5)
WBC: 8.4 10*3/uL (ref 4.0–10.5)
nRBC: 0 % (ref 0.0–0.2)

## 2023-03-24 LAB — BASIC METABOLIC PANEL
Anion gap: 11 (ref 5–15)
BUN: 22 mg/dL (ref 8–23)
CO2: 34 mmol/L — ABNORMAL HIGH (ref 22–32)
Calcium: 8.9 mg/dL (ref 8.9–10.3)
Chloride: 96 mmol/L — ABNORMAL LOW (ref 98–111)
Creatinine, Ser: 1.04 mg/dL — ABNORMAL HIGH (ref 0.44–1.00)
GFR, Estimated: 55 mL/min — ABNORMAL LOW (ref >60)
Glucose, Bld: 127 mg/dL — ABNORMAL HIGH (ref 70–99)
Potassium: 3.7 mmol/L (ref 3.5–5.1)
Sodium: 141 mmol/L (ref 135–145)

## 2023-03-24 LAB — PHOSPHORUS: Phosphorus: 3.9 mg/dL (ref 2.5–4.6)

## 2023-03-24 LAB — MAGNESIUM: Magnesium: 2.3 mg/dL (ref 1.7–2.4)

## 2023-03-24 NOTE — Evaluation (Signed)
Physical Therapy Evaluation Patient Details Name: Michelle Jenkins MRN: [id number] DOB: 1946-02-29 Today's Date: 03/24/2023  History of Present Illness  Michelle Jenkins is a 77 y.o. female with medical history significant for A-fib on Eliquis, diastolic CHF, CAD, h/o CVA, OSA on CPAP, morbid obesity, pulmonary artery hypertension followed by pulmonology on ambrisentanon home O2 at 2 L, HTN who presents to the ED with a 1 week history of shortness of breath on exertion.  She has been turning her oxygen up from 2 L to 3 L due to increased shortness of breath.  At the [facility] clinic O2 sats were 83 to 90% on 2 L.  She endorses a cough but denies fever or chills.  Also endorses lower extremity edema..  Denies chest pain and denies lower extremity pain.  She is compliant with her Eliquis.  Clinical Impression  Pt pleasant and interactive t/o session.  She reports no O2 at rest at home, but always using 3L during activity.  She was able to ambulate ~173ft (mostly with walker, ~25 ft with little to no UE use on hallway rail), SpO2 dropped from high to low 90s on 3L, pt does endorse some fatigue with the walk; states "I never really walk this far."  Pt did well, does not feel that she is generally at her baseline, especially regarding breathing but overall showed safe and appropriate mobility and gait for return home once medically cleared. Pt will benefit from PT to address functional limitations.        If plan is discharge home, recommend the following: A lot of help with bathing/dressing/bathroom;Assistance with cooking/housework;Assist for transportation   Can travel by private vehicle        Equipment Recommendations None recommended by PT  Recommendations for Other Services       Functional Status Assessment Patient has had a recent decline in their functional status and/or demonstrates limited ability to make significant improvements in function in a reasonable and predictable amount of time      Precautions / Restrictions Precautions Precautions: Fall Restrictions Weight Bearing Restrictions: No      Mobility  Bed Mobility               General bed mobility comments: Pt received seated in recliner at beginning of session.    Transfers Overall transfer level: Modified independent Equipment used: Rolling walker (2 wheels)               General transfer comment: Pt able to rise with good confidence and safety, no LOBs and minimal UE reliance    Ambulation/Gait Ambulation/Gait assistance: Supervision Gait Distance (Feet): 150 Feet Assistive device: Rolling walker (2 wheels), None         General Gait Details: ~125 ft with FWW and ~25 ft with little to no UE support along hallway rail.  On 3L O2 t/o the effort with SpO2 slowly dropping from high 90s to low 90s and HR creeping up near 100 bpm by the end  Stairs            Wheelchair Mobility     Tilt Bed    Modified Rankin (Stroke Patients Only)       Balance Overall balance assessment: Mild deficits observed, not formally tested  Pertinent Vitals/Pain Pain Assessment Pain Assessment: No/denies pain    Home Living Family/patient expects to be discharged to:: Private residence Living Arrangements: Alone Available Help at Discharge: Family;Available PRN/intermittently Type of Home: Apartment Home Access: Level entry       Home Layout: One level Home Equipment: Agricultural consultant (2 wheels);Other (comment);Cane - single point Additional Comments: Pt sleeps in regular bed with several pillows elevating UB.    Prior Function Prior Level of Function : Independent/Modified Independent             Mobility Comments: Pt uses RW first thing in the morning to get to the bathroom, then typically does not need it the rest of the day. Uses grocery store scooter if going to the grocery store. Denies falls. ADLs Comments: (I)  BADLs. Sponge bathes at baseline. Does not drive; either uses transport or her sister takes her places. (I) meal preparation, laundry, and medications. Assist for groceries.     Extremity/Trunk Assessment   Upper Extremity Assessment Upper Extremity Assessment: Overall WFL for tasks assessed    Lower Extremity Assessment Lower Extremity Assessment: Overall WFL for tasks assessed    Cervical / Trunk Assessment Cervical / Trunk Assessment: Normal  Communication   Communication Communication: No apparent difficulties  Cognition Arousal: Alert Behavior During Therapy: WFL for tasks assessed/performed Overall Cognitive Status: Within Functional Limits for tasks assessed                                 General Comments: Pleasant and motivated.        General Comments General comments (skin integrity, edema, etc.): On 3L throughout session; O2 in mid-90's, although dropped to 88% with leaning forward for LB ADLs at EOB.    Exercises     Assessment/Plan    PT Assessment Patient needs continued PT services  PT Problem List Decreased strength;Decreased activity tolerance;Decreased balance;Decreased safety awareness;Decreased knowledge of use of DME;Cardiopulmonary status limiting activity       PT Treatment Interventions DME instruction;Gait training;Functional mobility training;Therapeutic activities;Therapeutic exercise;Balance training;Patient/family education    PT Goals (Current goals can be found in the Care Plan section)  Acute Rehab PT Goals Patient Stated Goal: go home tomorrow PT Goal Formulation: With patient Time For Goal Achievement: 04/06/23 Potential to Achieve Goals: Good    Frequency Min 1X/week     Co-evaluation               AM-PAC PT "6 Clicks" Mobility  Outcome Measure Help needed turning from your back to your side while in a flat bed without using bedrails?: None Help needed moving from lying on your back to sitting on the  side of a flat bed without using bedrails?: None Help needed moving to and from a bed to a chair (including a wheelchair)?: None Help needed standing up from a chair using your arms (e.g., wheelchair or bedside chair)?: None Help needed to walk in hospital room?: A Little Help needed climbing 3-5 steps with a railing? : A Little 6 Click Score: 22    End of Session Equipment Utilized During Treatment: Gait belt;Oxygen Activity Tolerance: Patient limited by fatigue;Patient tolerated treatment well Patient left: in chair;with call bell/phone within reach Nurse Communication: Mobility status PT Visit Diagnosis: Muscle weakness (generalized) (M62.81);Difficulty in walking, not elsewhere classified (R26.2)    Time: 7829-5621 PT Time Calculation (min) (ACUTE ONLY): 24 min   Charges:   PT Evaluation $PT Eval  Low Complexity: 1 Low PT Treatments $Gait Training: 8-22 mins PT General Charges $$ ACUTE PT VISIT: 1 Visit         Malachi Pro, DPT 03/24/2023, 4:58 PM

## 2023-03-24 NOTE — Plan of Care (Signed)
  Problem: Clinical Measurements: Goal: Will remain free from infection Outcome: Progressing Note: No signs of infection noted. Patient has been afebrile.   Problem: Activity: Goal: Risk for activity intolerance will decrease Outcome: Progressing Note: Patient independently ambulate to [facility] with walker for ADLs.

## 2023-03-24 NOTE — Progress Notes (Signed)
[facility] Progress Note  Patient: Michelle Jenkins    WUJ:[id number]  DOA: 03/22/2023     Date of Service: the patient was seen and examined on 03/24/2023  Chief Complaint  Patient presents with   Shortness of Breath   Brief hospital course: Shamaya INAYA KIGHT is a 77 y.o. female with medical history significant for A-fib on Eliquis, diastolic CHF, CAD, h/o CVA, OSA on CPAP, morbid obesity, pulmonary artery hypertension followed by pulmonology on ambrisentanon home O2 at 2 L, HTN who presents to the ED with a 1 week history of shortness of breath on exertion.  She has been turning her oxygen up from 2 L to 3 L due to increased shortness of breath.  At the [facility] clinic O2 sats were 83 to 90% on 2 L.  She endorses a cough but denies fever or chills.  Also endorses lower extremity edema..  Denies chest pain and denies lower extremity pain.  She is compliant with her Eliquis. ED course and data review: Occasional tachypnea and satting to the high 90s on 3 L. Labs pertinent for troponin of 12 and BNP 474. Hemoglobin 9.5, down from 11 just 2 weeks prior Creatinine 1.06 which is about baseline WBC normal at 4700 and respiratory viral panel negative for COVID flu and RSV. EKG, personally viewed and interpreted showing A-fib at 79 with nonspecific ST-T wave changes Chest x-ray with enlarged cardiopericardial silhouette and pulmonary venous congestion as follows: IMPRESSION: Both views are technique degraded as detailed above. Marked enlargement of the cardiopericardial silhouette could represent cardiomegaly and/or pericardial effusion. This is relatively similar. Pulmonary venous congestion without overt congestive failure.   Patient was found to be wheezing in the ED and started on DuoNebs and methylprednisolone and was also administered a dose of IV Lasix Hospitalist consulted for admission.    Assessment and Plan: # Acute on chronic diastolic CHF (congestive heart failure)  Cardiomegaly  versus pericardial effusion on chest x-ray Pulmonary vascular congestion on chest x-ray with BNP 474 and large cardiac silhouette on CXR Continue IV Lasix 40 mg every 12 hourly No recent echo.  No recent cardiology follow-up since 2022 Echo ordered, still pending Continue olmesartan, add metoprolol Daily weights with intake and output monitor Cardiology consult appreciated  Acute on chronic respiratory failure with hypoxia (HCC) Likely multifactorial related to exacerbation of CHF and COPD with underlying OSA and PAH as well as concern for enlarged cardiac silhouette (cardiomegaly versus pericardial effusion) Refer to respective problems for management of underlying suspected etiologies Continue supplemental oxygen and wean to baseline as tolerated CT chest: . Right lower lobe consolidation. Follow up to confirm resolution recommended. 2. Cardiomegaly. Small pericardial effusion. 3. Aortic and coronary artery atherosclerosis Clinically no signs of pneumonia, monitor off antibiotics for now  COPD with acute exacerbation (HCC) Scheduled and as needed nebulized bronchodilators S/p IV steroids 40 every 12 x 2 doses, followed by prednisone 40 mg p.o. daily for 4 days Continue antitussives prn Flutter valve Respiratory viral panel was negative.   Anemia Uncertain acuity.  Hemoglobin 9.5 down from 11 a couple weeks prior No reported bleeding Anemia panel iron, folate and B12 within normal range Trend H&H Most recent colonoscopy seen was in 2000     Atrial fibrillation, CVR Chronic anticoagulation Eliquis 5 mg p.o. twice daily Lopressor 12.5 mg p.o. twice daily   Hypertension Continue telmisartan.  Adding metoprolol   CAD in native artery History of CVA Continue atorvastatin, beta-blocker, Eliquis   # Primary pulmonary hypertension  Obstructive sleep apnea Continue Ambrisentan Continue CPAP nightly Patient follows with pulmonology last seen in May 2024  # Morbid obesity Body  mass index is 50.26 kg/m.  Interventions: Calorie restricted diet and daily exercise advised to lose body weight.  Lifestyle modification discussed.   Diet: Heart healthy diet DVT Prophylaxis: Therapeutic Anticoagulation with Eliquis    Advance goals of care discussion: Full code  Family Communication: family was not present at bedside, at the time of interview.  The pt provided permission to discuss medical plan with the family. Opportunity was given to ask question and all questions were answered satisfactorily.   Disposition:  Pt is from Home, admitted with shortness of breath, dyspnea on exertion, still has shortness of breath, which precludes a safe discharge. Discharge to home, when stable and cleared by cardiology.  Subjective: No significant events overnight, patient feels little bit improvement but still has cough with phlegm production, shortness of breath, dyspnea on exertion.    Physical Exam: General: NAD, lying comfortably Appear in no distress, affect appropriate Eyes: PERRLA ENT: Oral Mucosa Clear, moist  Neck: no JVD,  Cardiovascular: S1 and S2 Present, no Murmur,  Respiratory: good respiratory effort, Bilateral Air entry equal and Decreased, no Crackles, no wheezes Abdomen: Bowel Sound present, Soft and no tenderness,  Skin: no rashes Extremities: 3-4+ R>L Pedal edema, no calf tenderness Neurologic: without any new focal findings Gait not checked due to patient safety concerns  Vitals:   03/23/23 2337 03/24/23 0454 03/24/23 0836 03/24/23 1219  BP: (!) 141/76 (!) 168/81 (!) 153/78 134/70  Pulse: 76 70 60 (!) 58  Resp: 18 18 16 15   Temp: 98.6 F (37 C) 97.9 F (36.6 C) 97.9 F (36.6 C) 98.2 F (36.8 C)  TempSrc: Oral     SpO2: 98% 92% 98% 98%  Weight:  (!) 137 kg    Height:        Intake/Output Summary (Last 24 hours) at 03/24/2023 1315 Last data filed at 03/24/2023 3086 Gross per 24 hour  Intake 240 ml  Output 1400 ml  Net -1160 ml   Filed  Weights   03/22/23 1934 03/22/23 2041 03/24/23 0454  Weight: 129 kg (!) 139.9 kg (!) 137 kg    Data Reviewed: I have personally reviewed and interpreted daily labs, tele strips, imagings as discussed above. I reviewed all nursing notes, pharmacy notes, vitals, pertinent old records I have discussed plan of care as described above with RN and patient/family.  CBC: Recent Labs  Lab 03/22/23 1230 03/23/23 0527 03/24/23 0636  WBC 4.7 5.1 8.4  HGB 9.5* 10.1* 10.0*  HCT 30.0* 31.0* 29.4*  MCV 101.0* 99.0 96.4  PLT 150 164 183   Basic Metabolic Panel: Recent Labs  Lab 03/22/23 1230 03/23/23 0527 03/24/23 0636  NA 144 142 141  K 3.8 3.6 3.7  CL 102 98 96*  CO2 33* 32 34*  GLUCOSE 105* 141* 127*  BUN 14 16 22   CREATININE 1.06* 0.98 1.04*  CALCIUM 8.9 8.7* 8.9  MG  --  2.1 2.3  PHOS  --  4.0 3.9    Studies: No results found.  Scheduled Meds:  ambrisentan  10 mg Oral Daily   apixaban  5 mg Oral BID   atorvastatin  20 mg Oral Daily   furosemide  40 mg Intravenous Q12H   guaiFENesin  600 mg Oral BID   irbesartan  75 mg Oral Daily   metoprolol tartrate  12.5 mg Oral BID   predniSONE  40 mg Oral Q breakfast   Continuous Infusions: PRN Meds: acetaminophen **OR** acetaminophen, albuterol, HYDROcodone-acetaminophen, morphine injection, ondansetron **OR** ondansetron (ZOFRAN) IV  Time spent: 40 minutes  Author: Gillis Santa. MD [facility] 03/24/2023 1:15 PM  To reach On-call, see care teams to locate the attending and reach out to them via [email or web address]. If 7PM-7AM, please contact night-coverage If you still have difficulty reaching the attending provider, please page the [facility] (Director on Call) for [facility] on amion for assistance.

## 2023-03-24 NOTE — Evaluation (Signed)
Occupational Therapy Evaluation Patient Details Name: Michelle Jenkins MRN: [id number] DOB: [birth year] Today's Date: 03/24/2023   History of Present Illness Bernestine SUKARI DOERFLINGER is a 77 y.o. female with medical history significant for A-fib on Eliquis, diastolic CHF, CAD, h/o CVA, OSA on CPAP, morbid obesity, pulmonary artery hypertension followed by pulmonology on ambrisentanon home O2 at 2 L, HTN who presents to the ED with a 1 week history of shortness of breath on exertion.  She has been turning her oxygen up from 2 L to 3 L due to increased shortness of breath.  At the [facility] clinic O2 sats were 83 to 90% on 2 L.  She endorses a cough but denies fever or chills.  Also endorses lower extremity edema..  Denies chest pain and denies lower extremity pain.  She is compliant with her Eliquis.   Clinical Impression   Patient received for OT evaluation. See flowsheet below for details of function. Generally, patient requiring supervision and assist with O2 tank for functional mobility, and (I)-MIN A for ADLs. On 3L per baseline at home.  Patient will benefit from continued OT while in acute care.       If plan is discharge home, recommend the following: A little help with bathing/dressing/bathroom;Assistance with cooking/housework;Assist for transportation    Functional Status Assessment  Patient has had a recent decline in their functional status and demonstrates the ability to make significant improvements in function in a reasonable and predictable amount of time.  Equipment Recommendations  None recommended by OT    Recommendations for Other Services       Precautions / Restrictions Precautions Precautions: Fall Restrictions Weight Bearing Restrictions: No      Mobility Bed Mobility               General bed mobility comments: Pt received seated at EOB at beginning of session.    Transfers Overall transfer level: Modified independent Equipment used: Rolling walker (2  wheels)               General transfer comment: Pt stood with RW; supervision and assist with O2 tank for pt to walk to hallway and back (approx 30 feet) and around bed without RW to the chair. No loss of balance with or without RW. Pt moving slowly but steadily.      Balance Overall balance assessment: Mild deficits observed, not formally tested                                         ADL either performed or assessed with clinical judgement   ADL Overall ADL's : Needs assistance/impaired   Eating/Feeding Details (indicate cue type and reason): anticipate (I)   Grooming Details (indicate cue type and reason): anticipate (I)   Upper Body Bathing Details (indicate cue type and reason): anticipate (I) from seated   Lower Body Bathing Details (indicate cue type and reason): anticipate (I) from seated (sponge bathing)     Lower Body Dressing: Minimal assistance Lower Body Dressing Details (indicate cue type and reason): Pt doffed non-skid socks; daughter assisted with donning socks; pt donned shoes and tied shoes. O2 dropping to high 80's with leaning forward; recovered to 94% with cues for pursed lip breathing (pt on 3L throughout session). Pt likely could have donned own socks with extra time.   Toilet Transfer Details (indicate cue type and reason): anticipate (I); pt with  BSC next to bed and states she has been using that.   Toileting - Clothing Manipulation Details (indicate cue type and reason): Anticipate (I)   Tub/Shower Transfer Details (indicate cue type and reason): not performed at home. Functional mobility during ADLs: Supervision/safety;Rolling walker (2 wheels)       Vision Baseline Vision/History: 1 Wears glasses Patient Visual Report: No change from baseline (Pt with a dark patch on glasses over L eye. Hx of CVA.)       Perception         Praxis         Pertinent Vitals/Pain Pain Assessment Pain Assessment: No/denies pain      Extremity/Trunk Assessment Upper Extremity Assessment Upper Extremity Assessment: Overall WFL for tasks assessed   Lower Extremity Assessment Lower Extremity Assessment: Overall WFL for tasks assessed   Cervical / Trunk Assessment Cervical / Trunk Assessment: Normal   Communication Communication Communication: No apparent difficulties   Cognition Arousal: Alert Behavior During Therapy: WFL for tasks assessed/performed Overall Cognitive Status: Within Functional Limits for tasks assessed                                 General Comments: Pleasant and motivated.     General Comments  On 3L throughout session; O2 in mid-90's, although dropped to 88% with leaning forward for LB ADLs at EOB.    Exercises     Shoulder Instructions      Home Living Family/patient expects to be discharged to:: Private residence Living Arrangements: Alone Available Help at Discharge: Family;Available PRN/intermittently (adult daughter) Type of Home: Apartment Home Access: Level entry     Home Layout: One level (one bedroom)     Bathroom Shower/Tub: Chief Strategy Officer: Standard     Home Equipment: Agricultural consultant (2 wheels);Other (comment) (small oxygen tank)   Additional Comments: Pt sleeps in regular bed with several pillows elevating UB.      Prior Functioning/Environment Prior Level of Function : Independent/Modified Independent             Mobility Comments: Pt uses RW first thing in the morning to get to the bathroom, then typically does not need it the rest of the day. Uses grocery store scooter if going to the grocery store. Denies falls. ADLs Comments: (I) BADLs. Sponge bathes at baseline. Does not drive; either uses transport or her sister takes her places. (I) meal preparation, laundry, and medications. Assist for groceries.        OT Problem List: Decreased activity tolerance;Impaired balance (sitting and/or standing)      OT  Treatment/Interventions: Self-care/ADL training;Therapeutic activities;Therapeutic exercise;Patient/family education    OT Goals(Current goals can be found in the care plan section) Acute Rehab OT Goals Patient Stated Goal: Get back to normal OT Goal Formulation: With patient Time For Goal Achievement: 04/07/23 Potential to Achieve Goals: Good ADL Goals Additional ADL Goal #1: Patient will perform BADL routine with energy conservation strategies and maintaining O2 saturation on 3L using LRAD/AE by d/c.  OT Frequency: Min 1X/week    Co-evaluation              AM-PAC OT "6 Clicks" Daily Activity     Outcome Measure Help from another person eating meals?: None Help from another person taking care of personal grooming?: None Help from another person toileting, which includes using toliet, bedpan, or urinal?: None Help from another person bathing (including washing, rinsing,  drying)?: None Help from another person to put on and taking off regular upper body clothing?: None Help from another person to put on and taking off regular lower body clothing?: A Little 6 Click Score: 23   End of Session Equipment Utilized During Treatment: Rolling walker (2 wheels);Oxygen Nurse Communication: Mobility status  Activity Tolerance: Patient tolerated treatment well Patient left: in chair;with call bell/phone within reach;with family/visitor present  OT Visit Diagnosis: Other (comment) (decreased activity tolerance)                Time: 1610-9604 OT Time Calculation (min): 27 min Charges:  OT General Charges $OT Visit: 1 Visit OT Evaluation $OT Eval Moderate Complexity: 1 Mod OT Treatments $Therapeutic Activity: 8-22 mins  Linward Foster, MS, OTR/L  Alvester Morin 03/24/2023, 3:59 PM

## 2023-03-25 ENCOUNTER — Other Ambulatory Visit (HOSPITAL_COMMUNITY): Payer: Self-pay

## 2023-03-25 ENCOUNTER — Telehealth (HOSPITAL_COMMUNITY): Payer: Self-pay | Admitting: Pharmacy Technician

## 2023-03-25 DIAGNOSIS — I5033 Acute on chronic diastolic (congestive) heart failure: Secondary | ICD-10-CM | POA: Diagnosis not present

## 2023-03-25 LAB — ECHOCARDIOGRAM COMPLETE
AR max vel: 1.79 cm2
AV Area VTI: 1.9 cm2
AV Area mean vel: 2.04 cm2
AV Mean grad: 10.5 mm[Hg]
AV Peak grad: 21.3 mm[Hg]
Ao pk vel: 2.31 m/s
Area-P 1/2: 4.46 cm2
Height: 65 in
MV VTI: 1.89 cm2
P 1/2 time: 418 ms
S' Lateral: 3.5 cm
Weight: 4934.78 [oz_av]

## 2023-03-25 LAB — CBC
HCT: 30.5 % — ABNORMAL LOW (ref 36.0–46.0)
Hemoglobin: 9.9 g/dL — ABNORMAL LOW (ref 12.0–15.0)
MCH: 31.7 pg (ref 26.0–34.0)
MCHC: 32.5 g/dL (ref 30.0–36.0)
MCV: 97.8 fL (ref 80.0–100.0)
Platelets: 170 10*3/uL (ref 150–400)
RBC: 3.12 MIL/uL — ABNORMAL LOW (ref 3.87–5.11)
RDW: 15 % (ref 11.5–15.5)
WBC: 6.8 10*3/uL (ref 4.0–10.5)
nRBC: 0 % (ref 0.0–0.2)

## 2023-03-25 LAB — BASIC METABOLIC PANEL
Anion gap: 13 (ref 5–15)
BUN: 24 mg/dL — ABNORMAL HIGH (ref 8–23)
CO2: 31 mmol/L (ref 22–32)
Calcium: 8.5 mg/dL — ABNORMAL LOW (ref 8.9–10.3)
Chloride: 97 mmol/L — ABNORMAL LOW (ref 98–111)
Creatinine, Ser: 1.04 mg/dL — ABNORMAL HIGH (ref 0.44–1.00)
GFR, Estimated: 55 mL/min — ABNORMAL LOW (ref >60)
Glucose, Bld: 100 mg/dL — ABNORMAL HIGH (ref 70–99)
Potassium: 3.3 mmol/L — ABNORMAL LOW (ref 3.5–5.1)
Sodium: 141 mmol/L (ref 135–145)

## 2023-03-25 LAB — PHOSPHORUS: Phosphorus: 4.1 mg/dL (ref 2.5–4.6)

## 2023-03-25 LAB — MAGNESIUM: Magnesium: 2.2 mg/dL (ref 1.7–2.4)

## 2023-03-25 MED ORDER — POTASSIUM CHLORIDE CRYS ER 20 MEQ PO TBCR
40.0000 meq | EXTENDED_RELEASE_TABLET | Freq: Once | ORAL | Status: AC
  Administered 2023-03-25: 40 meq via ORAL
  Filled 2023-03-25: qty 2

## 2023-03-25 MED ORDER — METOPROLOL SUCCINATE ER 25 MG PO TB24
12.5000 mg | ORAL_TABLET | Freq: Every day | ORAL | Status: DC
  Administered 2023-03-26: 12.5 mg via ORAL
  Filled 2023-03-25: qty 1

## 2023-03-25 MED ORDER — METOPROLOL SUCCINATE ER 25 MG PO TB24: 12.5000 mg | ORAL_TABLET | Freq: Every day | ORAL | Status: DC

## 2023-03-25 MED ORDER — EMPAGLIFLOZIN 10 MG PO TABS
10.0000 mg | ORAL_TABLET | Freq: Every day | ORAL | Status: DC
  Administered 2023-03-25 – 2023-03-26 (×2): 10 mg via ORAL
  Filled 2023-03-25 (×2): qty 1

## 2023-03-25 MED ORDER — TORSEMIDE 20 MG PO TABS
20.0000 mg | ORAL_TABLET | Freq: Once | ORAL | Status: AC
  Administered 2023-03-25: 20 mg via ORAL
  Filled 2023-03-25: qty 1

## 2023-03-25 MED ORDER — FUROSEMIDE 10 MG/ML IJ SOLN: 40.0000 mg | Freq: Once | INTRAMUSCULAR | Status: DC

## 2023-03-25 MED ORDER — FUROSEMIDE 40 MG PO TABS
40.0000 mg | ORAL_TABLET | Freq: Every day | ORAL | Status: DC
  Administered 2023-03-25 – 2023-03-26 (×2): 40 mg via ORAL
  Filled 2023-03-25 (×2): qty 1

## 2023-03-25 NOTE — Plan of Care (Signed)
  Problem: Education: Goal: Knowledge of General Education information will improve Description: Including pain rating scale, medication(s)/side effects and non-pharmacologic comfort measures Outcome: Progressing   Problem: Health Behavior/Discharge Planning: Goal: Ability to manage health-related needs will improve Outcome: Progressing   Problem: Clinical Measurements: Goal: Will remain free from infection Outcome: Progressing   Problem: Clinical Measurements: Goal: Respiratory complications will improve Outcome: Progressing   Problem: Activity: Goal: Risk for activity intolerance will decrease Outcome: Progressing   Problem: Coping: Goal: Level of anxiety will decrease Outcome: Progressing   Problem: Pain Management: Goal: General experience of comfort will improve Outcome: Progressing

## 2023-03-25 NOTE — Telephone Encounter (Signed)
Patient Product/process development scientist completed.    The patient is insured through [facility]. Patient has Medicare and is not eligible for a copay card, but may be able to apply for patient assistance, if available.    Ran test claim for Jardiance 10 mg and the current 30 day co-pay is $0.00.   This test claim was processed through [facility]- copay amounts may vary at other pharmacies due to pharmacy/plan contracts, or as the patient moves through the different stages of their insurance plan.     Roland Earl, CPHT Pharmacy Technician III Certified Patient Advocate [facility] Patient Advocate Team Direct Number: [phone]  Fax: [phone]

## 2023-03-25 NOTE — Plan of Care (Signed)
  Problem: Education: Goal: Knowledge of General Education information will improve Description: Including pain rating scale, medication(s)/side effects and non-pharmacologic comfort measures Outcome: Progressing   Problem: Health Behavior/Discharge Planning: Goal: Ability to manage health-related needs will improve Outcome: Progressing   Problem: Clinical Measurements: Goal: Ability to maintain clinical measurements within normal limits will improve Outcome: Progressing Goal: Will remain free from infection Outcome: Progressing Goal: Diagnostic test results will improve Outcome: Progressing Goal: Respiratory complications will improve Outcome: Progressing Goal: Cardiovascular complication will be avoided Outcome: Progressing   Problem: Activity: Goal: Risk for activity intolerance will decrease Outcome: Progressing   Problem: Nutrition: Goal: Adequate nutrition will be maintained Outcome: Progressing   Problem: Coping: Goal: Level of anxiety will decrease Outcome: Progressing   Problem: Elimination: Goal: Will not experience complications related to bowel motility Outcome: Progressing Goal: Will not experience complications related to urinary retention Outcome: Progressing   Problem: Pain Management: Goal: General experience of comfort will improve Outcome: Progressing   Problem: Safety: Goal: Ability to remain free from injury will improve Outcome: Progressing   Problem: Skin Integrity: Goal: Risk for impaired skin integrity will decrease Outcome: Progressing   Problem: Education: Goal: Ability to demonstrate management of disease process will improve Outcome: Progressing Goal: Ability to verbalize understanding of medication therapies will improve Outcome: Progressing Goal: Individualized Educational Video(s) Outcome: Progressing   Problem: Activity: Goal: Capacity to carry out activities will improve Outcome: Progressing   Problem: Cardiac: Goal:  Ability to achieve and maintain adequate cardiopulmonary perfusion will improve Outcome: Progressing   Problem: Education: Goal: Knowledge of disease or condition will improve Outcome: Progressing Goal: Knowledge of the prescribed therapeutic regimen will improve Outcome: Progressing Goal: Individualized Educational Video(s) Outcome: Progressing   Problem: Activity: Goal: Ability to tolerate increased activity will improve Outcome: Progressing Goal: Will verbalize the importance of balancing activity with adequate rest periods Outcome: Progressing   Problem: Respiratory: Goal: Ability to maintain a clear airway will improve Outcome: Progressing Goal: Levels of oxygenation will improve Outcome: Progressing Goal: Ability to maintain adequate ventilation will improve Outcome: Progressing

## 2023-03-25 NOTE — Progress Notes (Signed)
[facility] CLINIC CARDIOLOGY PROGRESS NOTE       Patient ID: Michelle Jenkins MRN: [id number] DOB/AGE: July 03, 1945 77 y.o.  Admit date: 03/22/2023 Referring Physician Dr Lucianne Muss Primary Physician Larae Grooms, NP Primary Cardiologist kc Reason for Consultation AECHF  HPI: Michelle Jenkins is a 77 y.o. female  with a past medical history of A-fib on Eliquis, HFpE, CAD, h/o CVA, OSA on CPAP, PAH on ambrisentanon and home o2 at 2 L  who presented to the ED on 03/22/2023 for DOE. Patient had been having increasing oxygen demands with hypoxia on pulse ox. Additionally, she is complaining of LE edema, orthopnea, and PND. Patient is compliant with her Eliquis and has not missed any doses.   Interval history: -Patient seen and examined this AM, reports she feels better overall.  -SOB improved, now on baseline O2 requirement.  -BP and HR controlled. Denies any chest pain, palpitations, dizziness.  -LE edema improved overall.   Review of systems complete and found to be negative unless listed above     Past Medical History:  Diagnosis Date   Anginal pain (HCC)    Asthma    Bronchitis    CAD (coronary artery disease)    CHF (congestive heart failure) (HCC)    CVA (cerebral infarction)    Dysrhythmia    GERD (gastroesophageal reflux disease)    History of chronic atrial fibrillation    Hypercholesteremia    Hypertension    Idiopathic pulmonary hypertension (HCC)    Myocardial infarction (HCC)    Obesity    OSA (obstructive sleep apnea)    Pre-diabetes    Pulmonary artery hypertension (HCC)    Shortness of breath dyspnea    Sleep apnea    Stroke [facility])     Past Surgical History:  Procedure Laterality Date   BREAST BIOPSY Right 11/15/2015   stereo, COMPLEX SCLEROSING LESION WITH INTRADUCTAL PAPILLOMA COMPONENT AND    BREAST EXCISIONAL BIOPSY Right 02/27/2016   NEG   BREAST LUMPECTOMY WITH NEEDLE LOCALIZATION Right 02/27/2016   Procedure: BREAST LUMPECTOMY WITH NEEDLE  LOCALIZATION;  Surgeon: Kieth Brightly, MD;  Location: [facility] ORS;  Service: General;  Laterality: Right;   CARDIAC CATHETERIZATION     CARDIAC CATHETERIZATION Right 12/07/2014   Procedure: Right and Left Heart Cath with Coronary Angiography;  Surgeon: Dalia Heading, MD;  Location: [facility] INVASIVE CV LAB;  Service: Cardiovascular;  Laterality: Right;   CARPAL TUNNEL RELEASE  1998  and 1999   CATARACT EXTRACTION W/PHACO Right 09/24/2019   Procedure: CATARACT EXTRACTION PHACO AND INTRAOCULAR LENS PLACEMENT (IOC) RIGHT;  Surgeon: Galen Manila, MD;  Location: [facility] ORS;  Service: Ophthalmology;  Laterality: Right;  [id number]:44.8 us6.96 [id number]   COLONOSCOPY  10/31/1998   DILATION AND CURETTAGE OF UTERUS     FOOT SURGERY Right    HYSTEROSCOPY     JOINT REPLACEMENT Left    KNEE ARTHROSCOPY AND ARTHROTOMY Right    lt knee replacement     TONSILLECTOMY     UVULOPALATOPHARYNGOPLASTY      Medications Prior to Admission  Medication Sig Dispense Refill Last Dose   ambrisentan (LETAIRIS) 10 MG tablet Take 10 mg by mouth daily.   Past Week   apixaban (ELIQUIS) 5 MG TABS tablet Take 1 tablet (5 mg total) by mouth 2 (two) times daily. 180 tablet 1 Past Week   atorvastatin (LIPITOR) 20 MG tablet Take 1 tablet daily 90 tablet 1 Past Week   ezetimibe (ZETIA) 10 MG tablet Take by mouth daily 90  tablet 1 Past Week   furosemide (LASIX) 20 MG tablet Take 1 tablet (20 mg total) by mouth 2 (two) times daily. TAKE 1 TABLET(20 MG) BY MOUTH TWICE DAILY AS NEEDED 180 tablet 1 Past Week   gabapentin (NEURONTIN) 300 MG capsule Take 2 capsules at bedtime. 180 capsule 3 Past Week   labetalol (NORMODYNE) 200 MG tablet Take 1 tablet by mouth twice daily 180 tablet 1 Past Week   olmesartan (BENICAR) 20 MG tablet Take 1 tablet (20 mg total) by mouth daily. 90 tablet 1 Past Week   pantoprazole (PROTONIX) 40 MG tablet TAKE 1 TABLET BY MOUTH EVERY DAY 180 tablet 1 Past Week   ondansetron (ZOFRAN ODT) 4 MG  disintegrating tablet Take 1 tablet (4 mg total) by mouth every 8 (eight) hours as needed for nausea or vomiting. (Patient not taking: Reported on 03/25/2023) 20 tablet 0 Not Taking   Psyllium (METAMUCIL FIBER PO) Take 1 Dose by mouth. Patient uses Metamucil Powder in the AM   prn   VENTOLIN HFA 108 (90 Base) MCG/ACT inhaler    prn   Social History   Socioeconomic History   Marital status: Single    Spouse name: Not on file   Number of children: Not on file   Years of education: Not on file   Highest education level: 8th grade  Occupational History   Occupation: retired  Tobacco Use   Smoking status: Former    Current packs/day: 0.00    Types: Cigarettes    Quit date: 06/15/2014    Years since quitting: 8.7   Smokeless tobacco: Never   Tobacco comments:    4-5 cigarettes a day when she smoked  Vaping Use   Vaping status: Never Used  Substance and Sexual Activity   Alcohol use: Yes    Alcohol/week: 0.0 - 1.0 standard drinks of alcohol    Comment: occasionally   Drug use: No   Sexual activity: Not Currently  Other Topics Concern   Not on file  Social History Narrative   Not on file   Social Determinants of Health   Financial Resource Strain: Low Risk  (02/22/2021)   Overall Financial Resource Strain (CARDIA)    Difficulty of Paying Living Expenses: Not very hard  Food Insecurity: No Food Insecurity (03/22/2023)   Hunger Vital Sign    Worried About Running Out of Food in the Last Year: Never true    Ran Out of Food in the Last Year: Never true  Transportation Needs: No Transportation Needs (03/22/2023)   PRAPARE - Administrator, Civil Service (Medical): No    Lack of Transportation (Non-Medical): No  Physical Activity: Inactive (02/22/2021)   Exercise Vital Sign    Days of Exercise per Week: 0 days    Minutes of Exercise per Session: 0 min  Stress: No Stress Concern Present (02/22/2021)   [employer] of Occupational Health - Occupational Stress  Questionnaire    Feeling of Stress : Not at all  Social Connections: Unknown (02/22/2021)   Social Connection and Isolation Panel [NHANES]    Frequency of Communication with Friends and Family: Three times a week    Frequency of Social Gatherings with Friends and Family: Three times a week    Attends Religious Services: 1 to 4 times per year    Active Member of Clubs or Organizations: No    Attends Banker Meetings: Never    Marital Status: Not on file  Intimate Partner Violence: Not At  Risk (03/22/2023)   Humiliation, Afraid, Rape, and Kick questionnaire    Fear of Current or Ex-Partner: No    Emotionally Abused: No    Physically Abused: No    Sexually Abused: No    Family History  Problem Relation Age of Onset   Diabetes Mother    Alzheimer's disease Mother    Throat cancer Brother    Diabetes Brother    Cancer Brother        lung   Hypertension Brother    Cancer Daughter 47       metastatic uterine PECOMA to the liver and lungs   Rheumatic fever Daughter    Diabetes Sister    Hypertension Sister    COPD Neg Hx    Heart disease Neg Hx    Stroke Neg Hx    Breast cancer Neg Hx      Vitals:   03/24/23 2349 03/25/23 0518 03/25/23 0734 03/25/23 1159  BP: (!) 151/86 (!) 144/81 123/64 138/85  Pulse: 73 (!) 56 64 (!) 53  Resp: 20 18 20 17   Temp: 98.5 F (36.9 C) 98.1 F (36.7 C) 98.2 F (36.8 C) 97.7 F (36.5 C)  TempSrc: Oral Oral Oral   SpO2: 97% 94% 100% 96%  Weight:      Height:        PHYSICAL EXAM General: awake, well nourished, in no acute distress sitting upright in hospital bed. HEENT: Normocephalic and atraumatic. Neck: No JVD.  Lungs: Normal respiratory effort. Clear bilaterally to auscultation. No wheezes, crackles, rhonchi.  Heart: HRRR. Normal S1 and S2 without gallops or murmurs.  Abdomen: Non-distended appearing.  Msk: Normal strength and tone for age. Extremities: Warm and well perfused. No clubbing, cyanosis. 1+ edema.  Neuro: Alert  and oriented X 3. Psych: Answers questions appropriately.   Labs: Basic Metabolic Panel: Recent Labs    03/24/23 0636 03/25/23 0613  NA 141 141  K 3.7 3.3*  CL 96* 97*  CO2 34* 31  GLUCOSE 127* 100*  BUN 22 24*  CREATININE 1.04* 1.04*  CALCIUM 8.9 8.5*  MG 2.3 2.2  PHOS 3.9 4.1   Liver Function Tests: Recent Labs    03/22/23 2117  AST 29  ALT 31  ALKPHOS 59  BILITOT 1.0  PROT 7.3  ALBUMIN 4.1   No results for input(s): "LIPASE", "AMYLASE" in the last 72 hours. CBC: Recent Labs    03/24/23 0636 03/25/23 0613  WBC 8.4 6.8  HGB 10.0* 9.9*  HCT 29.4* 30.5*  MCV 96.4 97.8  PLT 183 170   Cardiac Enzymes: Recent Labs    03/22/23 1230 03/22/23 1632  TROPONINIHS 12 12   BNP: Recent Labs    03/22/23 1230  BNP 474.5*   D-Dimer: No results for input(s): "DDIMER" in the last 72 hours. Hemoglobin A1C: No results for input(s): "HGBA1C" in the last 72 hours. Fasting Lipid Panel: No results for input(s): "CHOL", "HDL", "LDLCALC", "TRIG", "CHOLHDL", "LDLDIRECT" in the last 72 hours. Thyroid Function Tests: No results for input(s): "TSH", "T4TOTAL", "T3FREE", "THYROIDAB" in the last 72 hours.  Invalid input(s): "FREET3" Anemia Panel: Recent Labs    03/22/23 2117  VITAMINB12 407  FOLATE 15.4  FERRITIN 74  TIBC 297  IRON 43  RETICCTPCT 2.1     Radiology: ECHOCARDIOGRAM COMPLETE  Result Date: 03/25/2023    ECHOCARDIOGRAM REPORT   Patient Name:   Michelle Jenkins Date of Exam: 03/23/2023 Medical Rec #:  [id number]       Height:  65.0 in Accession #:    [id number]      Weight:       308.4 lb Date of Birth:  1945-05-30       BSA:          2.378 m Patient Age:    77 years        BP:           142/72 mmHg Patient Gender: F               HR:           81 bpm. Exam Location:  [facility] Procedure: 2D Echo, Cardiac Doppler and Color Doppler Indications:     CHF-Acute Diastolic I50.31  History:         Patient has no prior history of Echocardiogram examinations.                   CHF; CAD.  Sonographer:     Neysa Bonito Roar Referring Phys:  [id number] Andris Baumann Diagnosing Phys: Alwyn Pea MD IMPRESSIONS  1. Left ventricular ejection fraction, by estimation, is 60 to 65%. The left ventricle has normal function. The left ventricle has no regional wall motion abnormalities. The left ventricular internal cavity size was mildly dilated. There is mild concentric left ventricular hypertrophy. Left ventricular diastolic parameters are consistent with Grade I diastolic dysfunction (impaired relaxation).  2. Right ventricular systolic function is normal. The right ventricular size is normal.  3. Left atrial size was moderately dilated.  4. Right atrial size was mildly dilated.  5. The mitral valve is normal in structure. Mild to moderate mitral valve regurgitation.  6. The aortic valve is normal in structure. Aortic valve regurgitation is trivial.  7. Evidence of atrial level shunting detected by color flow Doppler. FINDINGS  Left Ventricle: Left ventricular ejection fraction, by estimation, is 60 to 65%. The left ventricle has normal function. The left ventricle has no regional wall motion abnormalities. The left ventricular internal cavity size was mildly dilated. There is  mild concentric left ventricular hypertrophy. Left ventricular diastolic parameters are consistent with Grade I diastolic dysfunction (impaired relaxation). Right Ventricle: The right ventricular size is normal. No increase in right ventricular wall thickness. Right ventricular systolic function is normal. Left Atrium: Left atrial size was moderately dilated. Right Atrium: Right atrial size was mildly dilated. Pericardium: There is no evidence of pericardial effusion. Mitral Valve: The mitral valve is normal in structure. Mild to moderate mitral valve regurgitation. MV peak gradient, 21.9 mmHg. The mean mitral valve gradient is 8.0 mmHg. Tricuspid Valve: The tricuspid valve is normal in structure. Tricuspid valve  regurgitation is mild. Aortic Valve: The aortic valve is normal in structure. Aortic valve regurgitation is trivial. Aortic regurgitation PHT measures 418 msec. Aortic valve mean gradient measures 10.5 mmHg. Aortic valve peak gradient measures 21.3 mmHg. Aortic valve area, by VTI measures 1.90 cm. Pulmonic Valve: The pulmonic valve was normal in structure. Pulmonic valve regurgitation is trivial. Aorta: The ascending aorta was not well visualized. IAS/Shunts: Evidence of atrial level shunting detected by color flow Doppler.  LEFT VENTRICLE PLAX 2D LVIDd:         5.30 cm   Diastology LVIDs:         3.50 cm   LV e' medial:    7.18 cm/s LV PW:         1.20 cm   LV E/e' medial:  23.8 LV IVS:        1.20  cm   LV e' lateral:   12.40 cm/s LVOT diam:     1.80 cm   LV E/e' lateral: 13.8 LV SV:         83 LV SV Index:   35 LVOT Area:     2.54 cm  RIGHT VENTRICLE RV Basal diam:  3.60 cm RV Mid diam:    3.30 cm RV S prime:     15.20 cm/s TAPSE (M-mode): 3.0 cm LEFT ATRIUM              Index        RIGHT ATRIUM           Index LA diam:        5.70 cm  2.40 cm/m   RA Area:     24.40 cm LA Vol (A2C):   194.0 ml 81.58 ml/m  RA Volume:   68.60 ml  28.85 ml/m LA Vol (A4C):   175.0 ml 73.59 ml/m LA Biplane Vol: 187.0 ml 78.64 ml/m  AORTIC VALVE                     PULMONIC VALVE AV Area (Vmax):    1.79 cm      PV Vmax:          1.17 m/s AV Area (Vmean):   2.04 cm      PV Peak grad:     5.5 mmHg AV Area (VTI):     1.90 cm      PR End Diast Vel: 7.84 msec AV Vmax:           230.50 cm/s   RVOT Peak grad:   3 mmHg AV Vmean:          145.000 cm/s AV VTI:            0.436 m AV Peak Grad:      21.3 mmHg AV Mean Grad:      10.5 mmHg LVOT Vmax:         162.00 cm/s LVOT Vmean:        116.000 cm/s LVOT VTI:          0.325 m LVOT/AV VTI ratio: 0.75 AI PHT:            418 msec  AORTA Ao Root diam: 2.80 cm Ao Asc diam:  3.20 cm MITRAL VALVE                TRICUSPID VALVE MV Area (PHT): 4.46 cm     TR Peak grad:   43.6 mmHg MV Area VTI:    1.89 cm     TR Vmax:        330.00 cm/s MV Peak grad:  21.9 mmHg MV Mean grad:  8.0 mmHg     SHUNTS MV Vmax:       2.34 m/s     Systemic VTI:  0.32 m MV Vmean:      124.0 cm/s   Systemic Diam: 1.80 cm MV Decel Time: 170 msec MV E velocity: 171.00 cm/s Alwyn Pea MD Electronically signed by Alwyn Pea MD Signature Date/Time: 03/25/2023/8:09:53 AM    Final    CT CHEST WO CONTRAST  Result Date: 03/23/2023 CLINICAL DATA:  Dyspnea. EXAM: CT CHEST WITHOUT CONTRAST TECHNIQUE: Multidetector CT imaging of the chest was performed following the standard protocol without IV contrast. RADIATION DOSE REDUCTION: This exam was performed according to the departmental dose-optimization program which includes automated exposure control, adjustment of the  mA and/or kV according to patient size and/or use of iterative reconstruction technique. COMPARISON:  None Available. FINDINGS: Cardiovascular: Cardiomegaly. Small pericardial effusion. Atheromatous calcifications of the aorta and coronary arteries. Mediastinum/Nodes: Postop changes consistent with right thyroidectomy. No suspicious mediastinal, hilar or axillary adenopathy identified. Right paratracheal 9 mm node seen. Lungs/Pleura: Dependent subsegmental atelectasis at the lung bases. Alveolar consolidation right lower lobe could represent a pneumonia. Follow up to confirm resolution recommended. Upper Abdomen: No acute abnormality. Musculoskeletal: No chest wall mass or suspicious bone lesions identified. IMPRESSION: 1. Right lower lobe consolidation. Follow up to confirm resolution recommended. 2. Cardiomegaly. Small pericardial effusion. 3. Aortic and coronary artery atherosclerosis (ICD10-I70.0). Electronically Signed   By: Layla Maw M.D.   On: 03/23/2023 10:41   DG Chest 2 View  Result Date: 03/22/2023 CLINICAL DATA:  Chest pain.  Shortness of breath. EXAM: CHEST - 2 VIEW COMPARISON:  03/31/2022 FINDINGS: Lateral view degraded by patient arm  position. Frontal view degraded by patient body habitus. Apical lordotic positioning. AP technique. Midline trachea. Marked enlargement of the cardiac/pericardial shadow. No gross pleural effusion. No pneumothorax. Pulmonary venous congestion. No overt congestive failure. No lobar consolidation. IMPRESSION: Both views are technique degraded as detailed above. Marked enlargement of the cardiopericardial silhouette could represent cardiomegaly and/or pericardial effusion. This is relatively similar. Pulmonary venous congestion without overt congestive failure. Electronically Signed   By: Jeronimo Greaves M.D.   On: 03/22/2023 14:48    ECHO as above  TELEMETRY reviewed by me [facility]) 03/25/2023 : atrial fibrillation rate 60-70s  EKG reviewed by me: Afib, rate controlled at 79 bpm. Non-specific ST & T wave abnl  Data reviewed by me [facility]) 03/25/2023: last 24h vitals tele labs imaging I/O provider notes  Principal Problem:   Acute on chronic diastolic CHF (congestive heart failure) (HCC) Active Problems:   Obstructive apnea   Primary pulmonary hypertension (HCC)   CAD in native artery   Hypertension   Atrial fibrillation (HCC)   Cardiomegaly vs pericardial effusion on CXR   COPD with acute exacerbation (HCC)   Acute on chronic respiratory failure with hypoxia (HCC)   Obesity, Class III, BMI 40-49.9 (morbid obesity) (HCC)   Chronic anticoagulation   Anemia   History of CVA (cerebrovascular accident)   Diastolic CHF, acute on chronic (HCC)    ASSESSMENT AND PLAN:   # Acute on chronic HFpEF Patient with worsening SOB, LE edema prior to admission. BNP elevated at 474. On chronic supplemental O2 but was noted to by hypoxic.  -Transition lasix to po today.  -Start jardiance 10 mg daily. Continue irbesartan 75 mg daily, metoprolol as below.  -Optimize lytes, K>4 and Mag>2. -Low sodium diet, strict I&Os. -Trend daily weights and fluid intake.  # Chronic atrial fibrillation Patient with hx of AF,  rates low in the 40s overnight.  -Decrease metoprolol to 12.5 once daily.  -Continue eliquis 5 mg twice daily. -Monitor on telemetry  # Hypertension # CAD # H/o CVA No evidence of ACS at this time -Continue atorvastatin 40 mg daily.   # A/C hypoxic respiratory failure likely multifactorial Patient has PAH, COPD and OSA in addition to AECHF at this time. -See plan for AoCHF above.   This patient's plan of care was discussed and created with Dr. Juliann Pares and he is in agreement.    Signed: Gale Journey, PA-C 03/25/2023, 12:01 PM [facility] Cardiology

## 2023-03-25 NOTE — Progress Notes (Addendum)
Heart Failure Stewardship Pharmacy Note  PCP: Larae Grooms, NP PCP-Cardiologist: None  HPI: Michelle Jenkins is a 77 y.o. female with A-fib on Eliquis, diastolic CHF, CAD, h/o CVA, OSA on CPAP, morbid obesity, pulmonary artery hypertension followed by pulmonology on ambrisentanon home O2 at 2 L, HTN who presented with shortness of breath. BNP on admission was 474.5. HS-troponin on admission was WNL. CXR on 12/624 notable for pulmonary venous congestion.  Pertinent cardiac history: PAH with PA pressures of 49/23 (34) noted on [facility] in 11/2014. Negative stress test in 10/2018 and 12/2018. Echo 03/23/23 showed LVEF of 60-65% with grade I diastolic dysfunction, normal RV function, and evidence of atrial level shunting.  Pertinent Lab Values: Creatinine, Ser  Date Value Ref Range Status  03/25/2023 1.04 (H) 0.44 - 1.00 mg/dL Final   BUN  Date Value Ref Range Status  03/25/2023 24 (H) 8 - 23 mg/dL Final  16/01/9603 21 8 - 27 mg/dL Final   Potassium  Date Value Ref Range Status  03/25/2023 3.3 (L) 3.5 - 5.1 mmol/L Final   Sodium  Date Value Ref Range Status  03/25/2023 141 135 - 145 mmol/L Final  03/04/2023 145 (H) 134 - 144 mmol/L Final   B Natriuretic Peptide  Date Value Ref Range Status  03/22/2023 474.5 (H) 0.0 - 100.0 pg/mL Final    Comment:    Performed at [facility], [address] [place], [place] [address]   Magnesium  Date Value Ref Range Status  03/25/2023 2.2 1.7 - 2.4 mg/dL Final    Comment:    Performed at [facility], [address] [place], [place] [address]   Hgb A1c MFr Bld  Date Value Ref Range Status  03/04/2023 6.0 (H) 4.8 - 5.6 % Final    Comment:             Prediabetes: 5.7 - 6.4          Diabetes: >6.4          Glycemic control for adults with diabetes: <7.0    TSH  Date Value Ref Range Status  03/04/2023 2.820 0.450 - 4.500 uIU/mL Final    Vital Signs: Admission weight: 308 lbs Temp:  [98.1 F (36.7 C)-98.5 F  (36.9 C)] 98.2 F (36.8 C) (12/09 0734) Pulse Rate:  [56-73] 64 (12/09 0734) Cardiac Rhythm: Atrial fibrillation (12/09 0700) Resp:  [15-20] 20 (12/09 0734) BP: (123-151)/(63-86) 123/64 (12/09 0734) SpO2:  [94 %-100 %] 100 % (12/09 0734)  Intake/Output Summary (Last 24 hours) at 03/25/2023 1009 Last data filed at 03/25/2023 0400 Gross per 24 hour  Intake 1080 ml  Output --  Net 1080 ml    Current Heart Failure Medications:  Loop diuretic: furosemide 40 mg PO daily Beta-Blocker: metoprolol succinate 12.5 mg daily ACEI/ARB/ARNI: irbesartan 75 mg daily MRA: none SGLT2i: none Other: Letairis 10 mg daily for PAH  Prior to admission Heart Failure Medications:  Loop diuretic: furosemide 20 mg bid Beta-Blocker: labetalol 200 mg bid ACEI/ARB/ARNI: olmesartan 20 mg daily MRA: none SGLT2i: none Other: Letairis 10 mg daily for PAH  Assessment: 1. Acute on chronic diastolic heart failure (LVEF 60-65%) with grade I diastolic dysfiunction and PAH, due to presumed NICM. NYHA class II-III symptoms.  -Symptoms: Reports symptoms have significantly improved. SOB is now only present with moderate exertion. LEE is still present, but improved from admission.  -Volume: Appears to still be relatively euvolemic on exam, though difficult to assess with body habitus. Transitioned to oral furosemide along  with the addition of Jardiance.  -Hemodynamics: BP is 120-150s/60-80s. HR is 50-60s, resulting in change of home BB to low dose metoprolol.  -SGLT2i: New start of Jardiance 10 mg daily today -MRA: Can consider adding spironolactone if creatinine is stable tomorrow. This would benefit HFpEF more than ARB, so if stopping is required from a BP perspective, that would be acceptable. Would also help maintain normokalemia. -ARNI: None at this time, currently on irbesartan 75 mg daily. - Appears to have been on sildenafil for PAH in the past and unsure why this was stopped base on chart review.   Plan: 1)  Medication changes recommended at this time: -Agree with adding Jardiance 10 mg daily  2) Patient assistance: -Jardiance copay is $0  3) Education: - Patient has been educated on current HF medications and potential additions to HF medication regimen - Patient verbalizes understanding that over the next few months, these medication doses may change and more medications may be added to optimize HF regimen - Patient has been educated on basic disease state pathophysiology and goals of therapy  Medication Assistance / Insurance Benefits Check: Does the patient have prescription insurance?    Type of insurance plan:  Does the patient qualify for medication assistance through manufacturers or grants? No  Outpatient Pharmacy: [facility]   Is the patient willing to utilize a [facility] pharmacy at discharge?: Yes  Please do not hesitate to reach out with questions or concerns,  Enos Fling, PharmD, CPP, BCPS Heart Failure Pharmacist  Phone - [phone] 03/25/2023 10:09 AM

## 2023-03-25 NOTE — Progress Notes (Signed)
Monitoring by Pharmacy for Pulmonary Hypertension Treatment   Indication - Continuation of prior to admission medication   Patient is 77 y.o.  with history of PAH on chronic ambrisentan (LETAIRIS) PTA and will be continued while hospitalized.   Continuing this medication order as an inpatient requires that monitoring parameters per REMS requirements must be met.  Chronic therapy is under the supervision of Herbon Meredeth Ide who is enrolled in the REMS program (REMS Provider ID [id number]) and is being notified of continuation of therapy. A staff message in EPIC has been sent notifying the certified prescriber.  The patient's authorization code or REMS patient ID as provided by the REMS program is [id number]. Per patient report has previously been educated on Pregnancy risk and Hepatotoxicity.  On admission pregnancy risk has been assessed and Patient is female and cannot get pregnant, and birth control option is appropriately not indicated as an option has been confirmed as birth control option for this patient.  Hepatic function has been evaluated. AST / ALT appropriate to continue medication at this time.     Latest Ref Rng & Units 03/22/2023    9:17 PM 03/04/2023   10:52 AM 10/02/2022    3:44 PM  Hepatic Function  Total Protein 6.5 - 8.1 g/dL 7.3  6.4  6.6   Albumin 3.5 - 5.0 g/dL 4.1  4.3  4.5   AST 15 - 41 U/L 29  20  16    ALT 0 - 44 U/L 31  11  10    Alk Phosphatase 38 - 126 U/L 59  62  69   Total Bilirubin <1.2 mg/dL 1.0  0.7  0.4   Bilirubin, Direct 0.0 - 0.2 mg/dL 0.2       If any question arise or pregnancy is identified during hospitalization, contact for bosentan: [phone]; macitentan: [phone]; ambrisentan: [phone].  Thank for you allowing [place] to participate in the care of this patient.  Michelle Jenkins 03/25/2023, 8:54 AM Clinical Pharmacist  Guides for Female Patient:  ambrisentan (LETAIRIS), macitentan (OPSUMIT), bosentan (TRACLEER).

## 2023-03-25 NOTE — Progress Notes (Signed)
[facility] Progress Note  Patient: Michelle Jenkins    OZH:[id number]  DOA: 03/22/2023     Date of Service: the patient was seen and examined on 03/25/2023  Chief Complaint  Patient presents with   Shortness of Breath   Brief hospital course: Michelle Jenkins is a 77 y.o. female with medical history significant for A-fib on Eliquis, diastolic CHF, CAD, h/o CVA, OSA on CPAP, morbid obesity, pulmonary artery hypertension followed by pulmonology on ambrisentanon home O2 at 2 L, HTN who presents to the ED with a 1 week history of shortness of breath on exertion.  She has been turning her oxygen up from 2 L to 3 L due to increased shortness of breath.  At the [facility] clinic O2 sats were 83 to 90% on 2 L.  She endorses a cough but denies fever or chills.  Also endorses lower extremity edema..  Denies chest pain and denies lower extremity pain.  She is compliant with her Eliquis. ED course and data review: Occasional tachypnea and satting to the high 90s on 3 L. Labs pertinent for troponin of 12 and BNP 474. Hemoglobin 9.5, down from 11 just 2 weeks prior Creatinine 1.06 which is about baseline WBC normal at 4700 and respiratory viral panel negative for COVID flu and RSV. EKG, personally viewed and interpreted showing A-fib at 79 with nonspecific ST-T wave changes Chest x-ray with enlarged cardiopericardial silhouette and pulmonary venous congestion as follows: IMPRESSION: Both views are technique degraded as detailed above. Marked enlargement of the cardiopericardial silhouette could represent cardiomegaly and/or pericardial effusion. This is relatively similar. Pulmonary venous congestion without overt congestive failure.   Patient was found to be wheezing in the ED and started on DuoNebs and methylprednisolone and was also administered a dose of IV Lasix Hospitalist consulted for admission.    Assessment and Plan: # Acute on chronic diastolic CHF (congestive heart failure)  Cardiomegaly  versus pericardial effusion on chest x-ray Pulmonary vascular congestion on chest x-ray with BNP 474 and large cardiac silhouette on CXR S/p IV Lasix 40 mg every 12 hourly, lost IV access on 12/9 No recent echo.  No recent cardiology follow-up since 2022 TTE LVEF 6065%, grade 1 diastolic dysfunction, PFO detected.  Recommend to follow-up with cardiology as an outpatient for further recommendation regarding PFO Continue olmesartan, add metoprolol Daily weights with intake and output monitor Cardiology consult, on 12/9 transition to oral Lasix 40 mg p.o. daily 12/9 we will give torsemide 20 mg nightly one-time dose, still has significant LE edema R>L   Acute on chronic respiratory failure with hypoxia Likely multifactorial related to exacerbation of CHF and COPD with underlying OSA and PAH as well as concern for enlarged cardiac silhouette (cardiomegaly versus pericardial effusion) Refer to respective problems for management of underlying suspected etiologies Continue supplemental oxygen and wean to baseline as tolerated CT chest: . Right lower lobe consolidation. Follow up to confirm resolution recommended. 2. Cardiomegaly. Small pericardial effusion. 3. Aortic and coronary artery atherosclerosis Clinically no signs of pneumonia, monitor off antibiotics for now  COPD with acute exacerbation (HCC) Scheduled and as needed nebulized bronchodilators S/p IV steroids 40 every 12 x 2 doses, followed by prednisone 40 mg p.o. daily for 4 days Continue antitussives prn Flutter valve Respiratory viral panel was negative.   Anemia Uncertain acuity.  Hemoglobin 9.9 down from 11 a couple weeks prior No reported bleeding Anemia panel iron, folate and B12 within normal range Trend H&H Most recent colonoscopy seen was in  2000     Atrial fibrillation, CVR Chronic anticoagulation Eliquis 5 mg p.o. twice daily Lopressor 12.5 mg p.o. twice daily   Hypertension Continue telmisartan.  Adding  metoprolol   CAD in native artery History of CVA Continue atorvastatin, beta-blocker, Eliquis   # Primary pulmonary hypertension Continue Ambrisentan # Obstructive sleep apnea Continue CPAP nightly Patient follows with pulmonology last seen in May 2024  # Morbid obesity Body mass index is 50.26 kg/m.  Interventions: Calorie restricted diet and daily exercise advised to lose body weight.  Lifestyle modification discussed.   Diet: Heart healthy diet DVT Prophylaxis: Therapeutic Anticoagulation with Eliquis    Advance goals of care discussion: Full code  Family Communication: family was not present at bedside, at the time of interview.  The pt provided permission to discuss medical plan with the family. Opportunity was given to ask question and all questions were answered satisfactorily.   Disposition:  Pt is from Home, admitted with shortness of breath, dyspnea on exertion, still has shortness of breath, which precludes a safe discharge. Discharge to home, when stable, most likely tomorrow a.m.  Subjective: No significant events overnight, patient breathing is improving, still has mild dyspnea on exertion, significant edema to bilateral lower extremities, right greater than left.  It has improved but he still has significant edema We will continue to diurese and plan for discharge tomorrow a.m.   Physical Exam: General: NAD, lying comfortably Appear in no distress, affect appropriate Eyes: PERRLA ENT: Oral Mucosa Clear, moist  Neck: no JVD,  Cardiovascular: S1 and S2 Present, no Murmur,  Respiratory: good respiratory effort, Bilateral Air entry equal and Decreased, no Crackles, no wheezes Abdomen: Bowel Sound present, Soft and no tenderness,  Skin: no rashes Extremities: 3-4+ R>L Pedal edema, no calf tenderness Neurologic: without any new focal findings Gait not checked due to patient safety concerns  Vitals:   03/24/23 2349 03/25/23 0518 03/25/23 0734 03/25/23 1159   BP: (!) 151/86 (!) 144/81 123/64 138/85  Pulse: 73 (!) 56 64 (!) 53  Resp: 20 18 20 17   Temp: 98.5 F (36.9 C) 98.1 F (36.7 C) 98.2 F (36.8 C) 97.7 F (36.5 C)  TempSrc: Oral Oral Oral   SpO2: 97% 94% 100% 96%  Weight:      Height:        Intake/Output Summary (Last 24 hours) at 03/25/2023 1514 Last data filed at 03/25/2023 1152 Gross per 24 hour  Intake 1080 ml  Output --  Net 1080 ml   Filed Weights   03/22/23 1934 03/22/23 2041 03/24/23 0454  Weight: 129 kg (!) 139.9 kg (!) 137 kg    Data Reviewed: I have personally reviewed and interpreted daily labs, tele strips, imagings as discussed above. I reviewed all nursing notes, pharmacy notes, vitals, pertinent old records I have discussed plan of care as described above with RN and patient/family.  CBC: Recent Labs  Lab 03/22/23 1230 03/23/23 0527 03/24/23 0636 03/25/23 0613  WBC 4.7 5.1 8.4 6.8  HGB 9.5* 10.1* 10.0* 9.9*  HCT 30.0* 31.0* 29.4* 30.5*  MCV 101.0* 99.0 96.4 97.8  PLT 150 164 183 170   Basic Metabolic Panel: Recent Labs  Lab 03/22/23 1230 03/23/23 0527 03/24/23 0636 03/25/23 0613  NA 144 142 141 141  K 3.8 3.6 3.7 3.3*  CL 102 98 96* 97*  CO2 33* 32 34* 31  GLUCOSE 105* 141* 127* 100*  BUN 14 16 22  24*  CREATININE 1.06* 0.98 1.04* 1.04*  CALCIUM 8.9 8.7*  8.9 8.5*  MG  --  2.1 2.3 2.2  PHOS  --  4.0 3.9 4.1    Studies: No results found.  Scheduled Meds:  ambrisentan  10 mg Oral Daily   apixaban  5 mg Oral BID   atorvastatin  20 mg Oral Daily   empagliflozin  10 mg Oral Daily   furosemide  40 mg Oral Daily   guaiFENesin  600 mg Oral BID   irbesartan  75 mg Oral Daily   [START ON 03/26/2023] metoprolol succinate  12.5 mg Oral Daily   predniSONE  40 mg Oral Q breakfast   Continuous Infusions: PRN Meds: acetaminophen **OR** acetaminophen, albuterol, HYDROcodone-acetaminophen, morphine injection, ondansetron **OR** ondansetron (ZOFRAN) IV  Time spent: 40  minutes  Author: Gillis Santa. MD [facility] 03/25/2023 3:14 PM  To reach On-call, see care teams to locate the attending and reach out to them via [email or web address]. If 7PM-7AM, please contact night-coverage If you still have difficulty reaching the attending provider, please page the [facility] (Director on Call) for [facility] on amion for assistance.

## 2023-03-25 NOTE — Telephone Encounter (Signed)
Patient Product/process development scientist completed.    The patient is insured through [facility]. Patient has Medicare and is not eligible for a copay card, but may be able to apply for patient assistance, if available.    Ran test claim for Farxiga 10 mg and the current 30 day co-pay is $0.00.  Ran test claim for Jardiance 10 mg and the current 30 day co-pay is $0.00.  This test claim was processed through [facility]- copay amounts may vary at other pharmacies due to pharmacy/plan contracts, or as the patient moves through the different stages of their insurance plan.     Roland Earl, CPHT Pharmacy Technician III Certified Patient Advocate [facility] Patient Advocate Team Direct Number: [phone]  Fax: [phone]

## 2023-03-25 NOTE — Progress Notes (Signed)
Heart Failure Navigator Progress Note  Assessed for Heart & Vascular TOC clinic readiness.  Patient does not meet criteria due to [facility] patient.   Navigator will sign off at this time.  Roxy Horseman, RN, BSN [facility] Heart Failure Navigator Secure Chat Only

## 2023-03-26 ENCOUNTER — Encounter: Payer: Self-pay | Admitting: Student

## 2023-03-26 ENCOUNTER — Other Ambulatory Visit: Payer: Self-pay

## 2023-03-26 DIAGNOSIS — I5033 Acute on chronic diastolic (congestive) heart failure: Secondary | ICD-10-CM | POA: Diagnosis not present

## 2023-03-26 LAB — CBC
HCT: 32.4 % — ABNORMAL LOW (ref 36.0–46.0)
Hemoglobin: 10.7 g/dL — ABNORMAL LOW (ref 12.0–15.0)
MCH: 31.9 pg (ref 26.0–34.0)
MCHC: 33 g/dL (ref 30.0–36.0)
MCV: 96.7 fL (ref 80.0–100.0)
Platelets: 187 10*3/uL (ref 150–400)
RBC: 3.35 MIL/uL — ABNORMAL LOW (ref 3.87–5.11)
RDW: 15 % (ref 11.5–15.5)
WBC: 7.6 10*3/uL (ref 4.0–10.5)
nRBC: 0 % (ref 0.0–0.2)

## 2023-03-26 LAB — MAGNESIUM: Magnesium: 2.3 mg/dL (ref 1.7–2.4)

## 2023-03-26 LAB — BASIC METABOLIC PANEL
Anion gap: 11 (ref 5–15)
BUN: 28 mg/dL — ABNORMAL HIGH (ref 8–23)
CO2: 37 mmol/L — ABNORMAL HIGH (ref 22–32)
Calcium: 8.6 mg/dL — ABNORMAL LOW (ref 8.9–10.3)
Chloride: 93 mmol/L — ABNORMAL LOW (ref 98–111)
Creatinine, Ser: 1.05 mg/dL — ABNORMAL HIGH (ref 0.44–1.00)
GFR, Estimated: 54 mL/min — ABNORMAL LOW (ref >60)
Glucose, Bld: 122 mg/dL — ABNORMAL HIGH (ref 70–99)
Potassium: 3.6 mmol/L (ref 3.5–5.1)
Sodium: 141 mmol/L (ref 135–145)

## 2023-03-26 LAB — PHOSPHORUS: Phosphorus: 3.9 mg/dL (ref 2.5–4.6)

## 2023-03-26 MED ORDER — METOPROLOL SUCCINATE ER 25 MG PO TB24
12.5000 mg | ORAL_TABLET | Freq: Every day | ORAL | 2 refills | Status: DC
  Filled 2023-03-26: qty 15, 30d supply, fill #0

## 2023-03-26 MED ORDER — FUROSEMIDE 40 MG PO TABS
40.0000 mg | ORAL_TABLET | Freq: Every day | ORAL | 2 refills | Status: DC
  Filled 2023-03-26: qty 30, 30d supply, fill #0

## 2023-03-26 MED ORDER — EMPAGLIFLOZIN 10 MG PO TABS
10.0000 mg | ORAL_TABLET | Freq: Every day | ORAL | 2 refills | Status: DC
  Filled 2023-03-26: qty 30, 30d supply, fill #0

## 2023-03-26 MED ORDER — POTASSIUM CHLORIDE CRYS ER 20 MEQ PO TBCR
40.0000 meq | EXTENDED_RELEASE_TABLET | Freq: Once | ORAL | Status: AC
  Administered 2023-03-26: 40 meq via ORAL
  Filled 2023-03-26: qty 2

## 2023-03-26 MED ORDER — POTASSIUM CHLORIDE CRYS ER 20 MEQ PO TBCR: 40.0000 meq | EXTENDED_RELEASE_TABLET | Freq: Once | ORAL | Status: DC

## 2023-03-26 NOTE — Progress Notes (Signed)
Heart Failure Stewardship Pharmacy Note  PCP: Larae Grooms, NP PCP-Cardiologist: None  HPI: Michelle Jenkins is a 77 y.o. female with A-fib on Eliquis, diastolic CHF, CAD, h/o CVA, OSA on CPAP, morbid obesity, pulmonary artery hypertension followed by pulmonology on ambrisentanon home O2 at 2 L, HTN who presented with shortness of breath. BNP on admission was 474.5. HS-troponin on admission was WNL. CXR on 12/624 notable for pulmonary venous congestion.  Pertinent cardiac history: PAH with PA pressures of 49/23 (34) noted on [facility] in 11/2014. Negative stress test in 10/2018 and 12/2018. Echo 03/23/23 showed LVEF of 60-65% with grade I diastolic dysfunction, normal RV function, and evidence of atrial level shunting.  Pertinent Lab Values: Creatinine, Ser  Date Value Ref Range Status  03/26/2023 1.05 (H) 0.44 - 1.00 mg/dL Final   BUN  Date Value Ref Range Status  03/26/2023 28 (H) 8 - 23 mg/dL Final  08/65/7846 21 8 - 27 mg/dL Final   Potassium  Date Value Ref Range Status  03/26/2023 3.6 3.5 - 5.1 mmol/L Final   Sodium  Date Value Ref Range Status  03/26/2023 141 135 - 145 mmol/L Final  03/04/2023 145 (H) 134 - 144 mmol/L Final   B Natriuretic Peptide  Date Value Ref Range Status  03/22/2023 474.5 (H) 0.0 - 100.0 pg/mL Final    Comment:    Performed at [facility], [address] [place], [place] [address]   Magnesium  Date Value Ref Range Status  03/26/2023 2.3 1.7 - 2.4 mg/dL Final    Comment:    Performed at [facility], [address] [place], [place] [address]   Hgb A1c MFr Bld  Date Value Ref Range Status  03/04/2023 6.0 (H) 4.8 - 5.6 % Final    Comment:             Prediabetes: 5.7 - 6.4          Diabetes: >6.4          Glycemic control for adults with diabetes: <7.0    TSH  Date Value Ref Range Status  03/04/2023 2.820 0.450 - 4.500 uIU/mL Final    Vital Signs: Admission weight: 308 lbs Temp:  [97.7 F (36.5 C)-98.6 F (37  C)] 98.6 F (37 C) (12/09 1720) Pulse Rate:  [53-92] 92 (12/09 1720) Cardiac Rhythm: Atrial fibrillation (12/09 1901) Resp:  [16-20] 16 (12/09 1720) BP: (121-138)/(64-85) 121/71 (12/09 1720) SpO2:  [96 %-100 %] 100 % (12/09 1720)  Intake/Output Summary (Last 24 hours) at 03/26/2023 0721 Last data filed at 03/26/2023 0600 Gross per 24 hour  Intake 960 ml  Output --  Net 960 ml    Current Heart Failure Medications:  Loop diuretic: furosemide 40 mg PO daily Beta-Blocker: metoprolol succinate 12.5 mg daily ACEI/ARB/ARNI: irbesartan 75 mg daily MRA: none SGLT2i: none Other: Letairis 10 mg daily for PAH  Prior to admission Heart Failure Medications:  Loop diuretic: furosemide 20 mg bid Beta-Blocker: labetalol 200 mg bid ACEI/ARB/ARNI: olmesartan 20 mg daily MRA: none SGLT2i: none Other: Letairis 10 mg daily for PAH  Assessment: 1. Acute on chronic diastolic heart failure (LVEF 60-65%) with grade I diastolic dysfiunction and PAH, due to presumed NICM. NYHA class II-III symptoms.  -Symptoms: Reports symptoms have significantly improved. SOB is now only present with moderate exertion. LEE is improved from admission. No weight yet today. -Volume: Appears to still be relatively euvolemic on exam, though difficult to assess with body habitus. Transitioned to oral furosemide along with  the addition of Jardiance. Creatinine is trending up along with BUN.  -Hemodynamics: BP is 120-150s/60-80s. HR is 50-60s, resulting in change of home BB to low dose metoprolol.  -SGLT2i: New start of Jardiance 10 mg daily today -MRA: Can consider adding spironolactone if creatinine is stable tomorrow. This would benefit HFpEF more than ARB, so if stopping is required from a BP perspective, that would be acceptable. Would also help maintain normokalemia. -ARNI: None at this time, currently on irbesartan 75 mg daily. - Appears to have been on sildenafil for PAH in the past and unsure the reason this was  stopped base on chart review.   Plan: 1) Medication changes recommended at this time: -Can consider adding spironolactone 12.5 mg daily outpatient. May require furosemide adjustment when started.  2) Patient assistance: -Jardiance copay is $0  3) Education: - Patient has been educated on current HF medications and potential additions to HF medication regimen - Patient verbalizes understanding that over the next few months, these medication doses may change and more medications may be added to optimize HF regimen - Patient has been educated on basic disease state pathophysiology and goals of therapy  Medication Assistance / Insurance Benefits Check: Does the patient have prescription insurance?    Type of insurance plan:  Does the patient qualify for medication assistance through manufacturers or grants? No  Outpatient Pharmacy: [facility] to admission [facility]   Is the patient willing to utilize a [facility] pharmacy at discharge?: Yes  Please do not hesitate to reach out with questions or concerns,  Enos Fling, PharmD, CPP, BCPS Heart Failure Pharmacist  Phone - [phone] 03/26/2023 7:21 AM

## 2023-03-26 NOTE — Progress Notes (Signed)
Physical Therapy Treatment Patient Details Name: Michelle Jenkins MRN: [id number] DOB: Apr 06, 1946 Today's Date: 03/26/2023   History of Present Illness Michelle Jenkins is a 77 y.o. female with medical history significant for A-fib on Eliquis, diastolic CHF, CAD, h/o CVA, OSA on CPAP, morbid obesity, pulmonary artery hypertension followed by pulmonology on ambrisentanon home O2 at 2 L, HTN who presents to the ED with a 1 week history of shortness of breath on exertion.  She has been turning her oxygen up from 2 L to 3 L due to increased shortness of breath.    PT Comments  Pt was pleasant and motivated to participate during the session and put forth good effort throughout. Pt completed transfers with Mod I for slight extra time, without difficulty. Pt performed gait training with Superv throughout, initially using RW with steady, safe gait pattern. Then transitioned to no AD, and pt exhibited decreased speed with mild unsteadiness but remained safe without assist. Pt's vitals were monitored during session and remained WNL, SpO2 > 92% throughout on 3L O2, pt reporting no adverse symptoms other than soreness in R knee. Pt will benefit from continued PT services upon discharge to safely address deficits listed in patient problem list for decreased caregiver assistance and eventual return to PLOF.     If plan is discharge home, recommend the following: A lot of help with bathing/dressing/bathroom;Assistance with cooking/housework;Assist for transportation   Can travel by private vehicle        Equipment Recommendations  Rolling walker (2 wheels)    Recommendations for Other Services       Precautions / Restrictions Precautions Precautions: Fall Restrictions Weight Bearing Restrictions: No     Mobility  Bed Mobility               General bed mobility comments: n/a pt seated EOB    Transfers Overall transfer level: Modified independent Equipment used: Rolling walker (2 wheels)                General transfer comment: capable BLE strength to complete from low bed, no real unsteadiness, confident throughout, taking slight extra time    Ambulation/Gait Ambulation/Gait assistance: Supervision Gait Distance (Feet): 130 Feet Assistive device: Rolling walker (2 wheels), None Gait Pattern/deviations: Step-through pattern, Decreased step length - right, Decreased step length - left, Decreased stride length Gait velocity: decreased with RW, very decreased with no AD     General Gait Details: ~80 ft with RW, very stable with consistent cadence, mildly decreased bilat step lengths, light BUE use, cuing for posture and RW management; ~50 ft with no AD, notable for greater decrease in bilat step lengths barely forming step-through pattern, increase in med/lat swaying with mild unsteadiness though no LOB; reporting near baseline   Stairs             Wheelchair Mobility     Tilt Bed    Modified Rankin (Stroke Patients Only)       Balance   Sitting-balance support: Feet supported, No upper extremity supported Sitting balance-Leahy Scale: Normal       Standing balance-Leahy Scale: Good Standing balance comment: steady in static stand with/without AD; during dynamic activities, very safe/steady with RW, some unsteadiness with no AD though able to manage without assist                            Cognition Arousal: Alert Behavior During Therapy: [facility] for tasks assessed/performed Overall Cognitive  Status: Within Functional Limits for tasks assessed                                          Exercises      General Comments        Pertinent Vitals/Pain Pain Assessment Pain Assessment: 0-10 Pain Score: 7  Pain Location: R knee Pain Descriptors / Indicators: Discomfort, Sore Pain Intervention(s): Monitored during session    Home Living                          Prior Function            PT Goals (current  goals can now be found in the care plan section) Acute Rehab PT Goals Patient Stated Goal: go home tomorrow PT Goal Formulation: With patient Time For Goal Achievement: 04/06/23 Potential to Achieve Goals: Good Progress towards PT goals: Progressing toward goals    Frequency    Min 1X/week      PT Plan      Co-evaluation              AM-PAC PT "6 Clicks" Mobility   Outcome Measure  Help needed turning from your back to your side while in a flat bed without using bedrails?: None Help needed moving from lying on your back to sitting on the side of a flat bed without using bedrails?: None Help needed moving to and from a bed to a chair (including a wheelchair)?: None Help needed standing up from a chair using your arms (e.g., wheelchair or bedside chair)?: None Help needed to walk in hospital room?: A Little Help needed climbing 3-5 steps with a railing? : A Little 6 Click Score: 22    End of Session Equipment Utilized During Treatment: Gait belt;Oxygen (3 L) Activity Tolerance: Patient limited by fatigue;Patient tolerated treatment well Patient left: in bed;with call bell/phone within reach Nurse Communication: Mobility status PT Visit Diagnosis: Muscle weakness (generalized) (M62.81);Difficulty in walking, not elsewhere classified (R26.2)     Time: 1610-9604 PT Time Calculation (min) (ACUTE ONLY): 16 min  Charges:                           Rosiland Oz SPT 03/26/23, 2:40 PM

## 2023-03-26 NOTE — Discharge Summary (Signed)
[facility] Discharge Summary   Patient: Michelle Jenkins ZOX:[id number]  PCP: Larae Grooms, NP  Date of admission: 03/22/2023   Date of discharge:  03/26/2023     Discharge Diagnoses:  Principal Problem:   Acute on chronic diastolic CHF (congestive heart failure) (HCC) Active Problems:   Acute on chronic respiratory failure with hypoxia (HCC)   Cardiomegaly vs pericardial effusion on CXR   COPD with acute exacerbation (HCC)   Obstructive apnea   Primary pulmonary hypertension (HCC)   Anemia   Atrial fibrillation (HCC)   Chronic anticoagulation   CAD in native artery   Hypertension   Obesity, Class III, BMI 40-49.9 (morbid obesity) (HCC)   History of CVA (cerebrovascular accident)   Diastolic CHF, acute on chronic (HCC)   Admitted From: Home Disposition:  Home Home health services  Recommendations for Outpatient Follow-up:  Follow with PCP in 1 week, continue fluid striction 1.5 L/day. Follow-up with cardiology in 1 to 2 weeks. Check body weight weekly and continue fluid striction 1.5 L/day Follow up LABS/TEST:     Follow-up Information     Custovic, Rozell Searing, DO. Go in 1 week(s).   Specialty: Cardiology Contact information: [address] [place] [address] [phone]         Larae Grooms, NP Follow up in 1 week(s).   Specialty: Nurse Practitioner Contact information: [address] [place] [address] [phone]                Diet recommendation: Cardiac diet  Activity: The patient is advised to gradually reintroduce usual activities, as tolerated  Discharge Condition: stable  Code Status: Full code   History of present illness: As per the H and P dictated on admission Hospital Course:  Michelle Jenkins is a 77 y.o. female with medical history significant for A-fib on Eliquis, diastolic CHF, CAD, h/o CVA, OSA on CPAP, morbid obesity, pulmonary artery hypertension followed by pulmonology on ambrisentanon home O2 at 2  L, HTN who presents to the ED with a 1 week history of shortness of breath on exertion.  She has been turning her oxygen up from 2 L to 3 L due to increased shortness of breath.  At the [facility] clinic O2 sats were 83 to 90% on 2 L.  She endorses a cough but denies fever or chills.  Also endorses lower extremity edema..  Denies chest pain and denies lower extremity pain.  She is compliant with her Eliquis. ED course and data review: Occasional tachypnea and satting to the high 90s on 3 L. Labs pertinent for troponin of 12 and BNP 474. Hemoglobin 9.5, down from 11 just 2 weeks prior Creatinine 1.06 which is about baseline WBC normal at 4700 and respiratory viral panel negative for COVID flu and RSV. EKG, personally viewed and interpreted showing A-fib at 79 with nonspecific ST-T wave changes Chest x-ray with enlarged cardiopericardial silhouette and pulmonary venous congestion as follows: IMPRESSION: Both views are technique degraded as detailed above. Marked enlargement of the cardiopericardial silhouette could represent cardiomegaly and/or pericardial effusion. This is relatively similar. Pulmonary venous congestion without overt congestive failure.   Patient was found to be wheezing in the ED and started on DuoNebs and methylprednisolone and was also administered a dose of IV Lasix Hospitalist consulted for admission.      Assessment and Plan: # Acute on chronic diastolic CHF (congestive heart failure)  Cardiomegaly versus pericardial effusion on chest x-ray Pulmonary vascular congestion on chest x-ray with BNP 474  and large cardiac silhouette on CXR S/p IV Lasix 40 mg every 12 hourly, lost IV access on 12/9 No recent echo.  No recent cardiology follow-up since 2022 TTE LVEF 6065%, grade 1 diastolic dysfunction, PFO detected.  Recommend to follow-up with cardiology as an outpatient for further recommendation regarding PFO Continue olmesartan, add metoprolol Cardiology consult, on 12/9  transition to oral Lasix 40 mg p.o. daily 12/9 s/p Torsemide 20 mg x1 dose at night given due to significant LE edema R>L Patient was cleared by cardiology to discharge on Lasix 40 mg p.o. daily, continue Toprol-XL 12.5 mg p.o. daily, olmesartan 40 mg p.o. daily.  Jardiance 10 mg p.o. daily   # Acute on chronic respiratory failure with hypoxia Likely multifactorial related to exacerbation of CHF and COPD with underlying OSA and PAH as well as concern for enlarged cardiac silhouette (cardiomegaly versus pericardial effusion) Continue supplemental oxygen, currently patient is at baseline CT chest: . Right lower lobe consolidation. Follow up to confirm resolution recommended. 2. Cardiomegaly. Small pericardial effusion. 3. Aortic and coronary artery atherosclerosis Clinically no signs of pneumonia, monitor off antibiotics for now   # COPD with acute exacerbation S/p DuoNeb, and S/p IV steroids 40 every 12 x 2 doses, followed by prednisone 40 mg p.o. daily for 4 days. S/p antitussives prn. Flutter valve Respiratory viral panel was negative.  Resumed albuterol inhaler. # Anemia of chronic disease.  Hb 10.7, remained stable.  Patient denies any GI bleeding. Most recent colonoscopy seen was in 2000 # Atrial fibrillation, CVR, Chronic anticoagulation Eliquis 5 mg p.o. twice daily. S/p Lopressor 12.5 mg p.o. BID.  Transition to Toprol-XL 12.5 mg p.o. daily # Hypertension: Continue telmisartan.  Started Toprol-XL # CAD in native artery and History of CVA: Continue atorvastatin, beta-blocker, Eliquis # Primary pulmonary hypertension: Continue Ambrisentan # Obstructive sleep apnea: Continue CPAP nightly. Patient follows with pulmonology last seen in May 2024 # Morbid obesity Body mass index is 50.26 kg/m.  Interventions: Calorie restricted diet and daily exercise advised to lose body weight.  Lifestyle modification discussed.  Body mass index is 50.26 kg/m.  Nutrition Interventions:  - Patient was  instructed, not to drive, operate heavy machinery, perform activities at heights, swimming or participation in water activities or provide baby sitting services while on Pain, Sleep and Anxiety Medications; until her outpatient Physician has advised to do so again.  - Also recommended to not to take more than prescribed Pain, Sleep and Anxiety Medications.  Patient was seen by physical therapy, who recommended Home health, which was arranged. On the day of the discharge the patient's vitals were stable, and no other acute medical condition were reported by patient. the patient was felt safe to be discharge at Home with Home health.  Consultants: Cardiology Procedures: None  Discharge Exam: General: Appear in no distress, Oral Mucosa Clear, moist. Cardiovascular: S1 and S2 Present, no Murmur, Respiratory: normal respiratory effort, Bilateral Air entry present and no Crackles, no wheezes Abdomen: Bowel Sound present, Soft and no tenderness. Extremities: no Pedal edema, no calf tenderness Neurology: alert and oriented to time, place, and person affect appropriate.  Filed Weights   03/22/23 1934 03/22/23 2041 03/24/23 0454  Weight: 129 kg (!) 139.9 kg (!) 137 kg   Vitals:   03/25/23 1720 03/26/23 0915  BP: 121/71 (!) 158/70  Pulse: 92 62  Resp: 16 17  Temp: 98.6 F (37 C) 98 F (36.7 C)  SpO2: 100% 97%    DISCHARGE MEDICATION: Allergies as of 03/26/2023  Reactions   Ace Inhibitors Cough   Nyquil Multi-symptom [pseudoeph-doxylamine-dm-apap] Itching   Tylenol [acetaminophen] Itching        Medication List     STOP taking these medications    labetalol 200 MG tablet Commonly known as: NORMODYNE   ondansetron 4 MG disintegrating tablet Commonly known as: Zofran ODT       TAKE these medications    ambrisentan 10 MG tablet Commonly known as: LETAIRIS Take 10 mg by mouth daily.   apixaban 5 MG Tabs tablet Commonly known as: Eliquis Take 1 tablet (5 mg  total) by mouth 2 (two) times daily.   atorvastatin 20 MG tablet Commonly known as: LIPITOR Take 1 tablet daily   empagliflozin 10 MG Tabs tablet Commonly known as: JARDIANCE Take 1 tablet (10 mg total) by mouth daily. Start taking on: March 27, 2023   ezetimibe 10 MG tablet Commonly known as: ZETIA Take by mouth daily   furosemide 40 MG tablet Commonly known as: LASIX Take 1 tablet (40 mg total) by mouth daily. TAKE 1 TABLET(20 MG) BY MOUTH TWICE DAILY AS NEEDED What changed:  medication strength how much to take when to take this   gabapentin 300 MG capsule Commonly known as: NEURONTIN Take 2 capsules at bedtime.   METAMUCIL FIBER PO Take 1 Dose by mouth. Patient uses Metamucil Powder in the AM   metoprolol succinate 25 MG 24 hr tablet Commonly known as: TOPROL-XL Take 0.5 tablets (12.5 mg total) by mouth daily. Start taking on: March 27, 2023   olmesartan 20 MG tablet Commonly known as: BENICAR Take 1 tablet (20 mg total) by mouth daily.   pantoprazole 40 MG tablet Commonly known as: PROTONIX TAKE 1 TABLET BY MOUTH EVERY DAY   Ventolin HFA 108 (90 Base) MCG/ACT inhaler Generic drug: albuterol       Allergies  Allergen Reactions   Ace Inhibitors Cough   Nyquil Multi-Symptom [Pseudoeph-Doxylamine-Dm-Apap] Itching   Tylenol [Acetaminophen] Itching   Discharge Instructions     Call MD for:   Complete by: As directed    Worsening of lower extremity edema or weight gain   Call MD for:  difficulty breathing, headache or visual disturbances   Complete by: As directed    Call MD for:  extreme fatigue   Complete by: As directed    Call MD for:  persistant dizziness or light-headedness   Complete by: As directed    Call MD for:  persistant nausea and vomiting   Complete by: As directed    Call MD for:  severe uncontrolled pain   Complete by: As directed    Call MD for:  temperature >100.4   Complete by: As directed    Diet - low sodium heart  healthy   Complete by: As directed    Fluid restriction 1.5 L/day   Discharge instructions   Complete by: As directed    Follow with PCP in 1 week, continue fluid striction 1.5 L/day. Follow-up with cardiology in 1 to 2 weeks. Check body weight weekly and continue fluid striction 1.5 L/day   Increase activity slowly   Complete by: As directed        The results of significant diagnostics from this hospitalization (including imaging, microbiology, ancillary and laboratory) are listed below for reference.    Significant Diagnostic Studies: ECHOCARDIOGRAM COMPLETE  Result Date: 03/25/2023    ECHOCARDIOGRAM REPORT   Patient Name:   Michelle Jenkins Date of Exam: 03/23/2023 Medical Rec #:  [id number]  Height:       65.0 in Accession #:    [id number]      Weight:       308.4 lb Date of Birth:  December 15, 1945       BSA:          2.378 m Patient Age:    77 years        BP:           142/72 mmHg Patient Gender: F               HR:           81 bpm. Exam Location:  [facility] Procedure: 2D Echo, Cardiac Doppler and Color Doppler Indications:     CHF-Acute Diastolic I50.31  History:         Patient has no prior history of Echocardiogram examinations.                  CHF; CAD.  Sonographer:     Neysa Bonito Roar Referring Phys:  [id number] Andris Baumann Diagnosing Phys: Alwyn Pea MD IMPRESSIONS  1. Left ventricular ejection fraction, by estimation, is 60 to 65%. The left ventricle has normal function. The left ventricle has no regional wall motion abnormalities. The left ventricular internal cavity size was mildly dilated. There is mild concentric left ventricular hypertrophy. Left ventricular diastolic parameters are consistent with Grade I diastolic dysfunction (impaired relaxation).  2. Right ventricular systolic function is normal. The right ventricular size is normal.  3. Left atrial size was moderately dilated.  4. Right atrial size was mildly dilated.  5. The mitral valve is normal in structure. Mild to  moderate mitral valve regurgitation.  6. The aortic valve is normal in structure. Aortic valve regurgitation is trivial.  7. Evidence of atrial level shunting detected by color flow Doppler. FINDINGS  Left Ventricle: Left ventricular ejection fraction, by estimation, is 60 to 65%. The left ventricle has normal function. The left ventricle has no regional wall motion abnormalities. The left ventricular internal cavity size was mildly dilated. There is  mild concentric left ventricular hypertrophy. Left ventricular diastolic parameters are consistent with Grade I diastolic dysfunction (impaired relaxation). Right Ventricle: The right ventricular size is normal. No increase in right ventricular wall thickness. Right ventricular systolic function is normal. Left Atrium: Left atrial size was moderately dilated. Right Atrium: Right atrial size was mildly dilated. Pericardium: There is no evidence of pericardial effusion. Mitral Valve: The mitral valve is normal in structure. Mild to moderate mitral valve regurgitation. MV peak gradient, 21.9 mmHg. The mean mitral valve gradient is 8.0 mmHg. Tricuspid Valve: The tricuspid valve is normal in structure. Tricuspid valve regurgitation is mild. Aortic Valve: The aortic valve is normal in structure. Aortic valve regurgitation is trivial. Aortic regurgitation PHT measures 418 msec. Aortic valve mean gradient measures 10.5 mmHg. Aortic valve peak gradient measures 21.3 mmHg. Aortic valve area, by VTI measures 1.90 cm. Pulmonic Valve: The pulmonic valve was normal in structure. Pulmonic valve regurgitation is trivial. Aorta: The ascending aorta was not well visualized. IAS/Shunts: Evidence of atrial level shunting detected by color flow Doppler.  LEFT VENTRICLE PLAX 2D LVIDd:         5.30 cm   Diastology LVIDs:         3.50 cm   LV e' medial:    7.18 cm/s LV PW:         1.20 cm   LV E/e' medial:  23.8 LV IVS:  1.20 cm   LV e' lateral:   12.40 cm/s LVOT diam:     1.80 cm   LV  E/e' lateral: 13.8 LV SV:         83 LV SV Index:   35 LVOT Area:     2.54 cm  RIGHT VENTRICLE RV Basal diam:  3.60 cm RV Mid diam:    3.30 cm RV S prime:     15.20 cm/s TAPSE (M-mode): 3.0 cm LEFT ATRIUM              Index        RIGHT ATRIUM           Index LA diam:        5.70 cm  2.40 cm/m   RA Area:     24.40 cm LA Vol (A2C):   194.0 ml 81.58 ml/m  RA Volume:   68.60 ml  28.85 ml/m LA Vol (A4C):   175.0 ml 73.59 ml/m LA Biplane Vol: 187.0 ml 78.64 ml/m  AORTIC VALVE                     PULMONIC VALVE AV Area (Vmax):    1.79 cm      PV Vmax:          1.17 m/s AV Area (Vmean):   2.04 cm      PV Peak grad:     5.5 mmHg AV Area (VTI):     1.90 cm      PR End Diast Vel: 7.84 msec AV Vmax:           230.50 cm/s   RVOT Peak grad:   3 mmHg AV Vmean:          145.000 cm/s AV VTI:            0.436 m AV Peak Grad:      21.3 mmHg AV Mean Grad:      10.5 mmHg LVOT Vmax:         162.00 cm/s LVOT Vmean:        116.000 cm/s LVOT VTI:          0.325 m LVOT/AV VTI ratio: 0.75 AI PHT:            418 msec  AORTA Ao Root diam: 2.80 cm Ao Asc diam:  3.20 cm MITRAL VALVE                TRICUSPID VALVE MV Area (PHT): 4.46 cm     TR Peak grad:   43.6 mmHg MV Area VTI:   1.89 cm     TR Vmax:        330.00 cm/s MV Peak grad:  21.9 mmHg MV Mean grad:  8.0 mmHg     SHUNTS MV Vmax:       2.34 m/s     Systemic VTI:  0.32 m MV Vmean:      124.0 cm/s   Systemic Diam: 1.80 cm MV Decel Time: 170 msec MV E velocity: 171.00 cm/s Alwyn Pea MD Electronically signed by Alwyn Pea MD Signature Date/Time: 03/25/2023/8:09:53 AM    Final    CT CHEST WO CONTRAST  Result Date: 03/23/2023 CLINICAL DATA:  Dyspnea. EXAM: CT CHEST WITHOUT CONTRAST TECHNIQUE: Multidetector CT imaging of the chest was performed following the standard protocol without IV contrast. RADIATION DOSE REDUCTION: This exam was performed according to the departmental dose-optimization program which includes automated exposure control, adjustment of  the mA  and/or kV according to patient size and/or use of iterative reconstruction technique. COMPARISON:  None Available. FINDINGS: Cardiovascular: Cardiomegaly. Small pericardial effusion. Atheromatous calcifications of the aorta and coronary arteries. Mediastinum/Nodes: Postop changes consistent with right thyroidectomy. No suspicious mediastinal, hilar or axillary adenopathy identified. Right paratracheal 9 mm node seen. Lungs/Pleura: Dependent subsegmental atelectasis at the lung bases. Alveolar consolidation right lower lobe could represent a pneumonia. Follow up to confirm resolution recommended. Upper Abdomen: No acute abnormality. Musculoskeletal: No chest wall mass or suspicious bone lesions identified. IMPRESSION: 1. Right lower lobe consolidation. Follow up to confirm resolution recommended. 2. Cardiomegaly. Small pericardial effusion. 3. Aortic and coronary artery atherosclerosis (ICD10-I70.0). Electronically Signed   By: Layla Maw M.D.   On: 03/23/2023 10:41   DG Chest 2 View  Result Date: 03/22/2023 CLINICAL DATA:  Chest pain.  Shortness of breath. EXAM: CHEST - 2 VIEW COMPARISON:  03/31/2022 FINDINGS: Lateral view degraded by patient arm position. Frontal view degraded by patient body habitus. Apical lordotic positioning. AP technique. Midline trachea. Marked enlargement of the cardiac/pericardial shadow. No gross pleural effusion. No pneumothorax. Pulmonary venous congestion. No overt congestive failure. No lobar consolidation. IMPRESSION: Both views are technique degraded as detailed above. Marked enlargement of the cardiopericardial silhouette could represent cardiomegaly and/or pericardial effusion. This is relatively similar. Pulmonary venous congestion without overt congestive failure. Electronically Signed   By: Jeronimo Greaves M.D.   On: 03/22/2023 14:48    Microbiology: Recent Results (from the past 240 hour(s))  Resp panel by RT-PCR (RSV, Flu A&B, Covid) Anterior Nasal Swab     Status:  None   Collection Time: 03/22/23 12:30 PM   Specimen: Anterior Nasal Swab  Result Value Ref Range Status   SARS Coronavirus 2 by RT PCR NEGATIVE NEGATIVE Final    Comment: (NOTE) SARS-CoV-2 target nucleic acids are NOT DETECTED.  The SARS-CoV-2 RNA is generally detectable in upper respiratory specimens during the acute phase of infection. The lowest concentration of SARS-CoV-2 viral copies this assay can detect is 138 copies/mL. A negative result does not preclude SARS-Cov-2 infection and should not be used as the sole basis for treatment or other patient management decisions. A negative result may occur with  improper specimen collection/handling, submission of specimen other than nasopharyngeal swab, presence of viral mutation(s) within the areas targeted by this assay, and inadequate number of viral copies(<138 copies/mL). A negative result must be combined with clinical observations, patient history, and epidemiological information. The expected result is Negative.  Fact Sheet for Patients:  BloggerCourse.com  Fact Sheet for Healthcare Providers:  SeriousBroker.it  This test is no t yet approved or cleared by the [place] FDA and  has been authorized for detection and/or diagnosis of SARS-CoV-2 by FDA under an Emergency Use Authorization (EUA). This EUA will remain  in effect (meaning this test can be used) for the duration of the COVID-19 declaration under Section 564(b)(1) of the Act, 21 U.S.C.section 360bbb-3(b)(1), unless the authorization is terminated  or revoked sooner.       Influenza A by PCR NEGATIVE NEGATIVE Final   Influenza B by PCR NEGATIVE NEGATIVE Final    Comment: (NOTE) The Xpert Xpress SARS-CoV-2/FLU/RSV plus assay is intended as an aid in the diagnosis of influenza from Nasopharyngeal swab specimens and should not be used as a sole basis for treatment. Nasal washings and aspirates are unacceptable  for Xpert Xpress SARS-CoV-2/FLU/RSV testing.  Fact Sheet for Patients: BloggerCourse.com  Fact Sheet for Healthcare Providers: SeriousBroker.it  This  test is not yet approved or cleared by the [place] and has been authorized for detection and/or diagnosis of SARS-CoV-2 by FDA under an Emergency Use Authorization (EUA). This EUA will remain in effect (meaning this test can be used) for the duration of the COVID-19 declaration under Section 564(b)(1) of the Act, 21 U.S.C. section 360bbb-3(b)(1), unless the authorization is terminated or revoked.     Resp Syncytial Virus by PCR NEGATIVE NEGATIVE Final    Comment: (NOTE) Fact Sheet for Patients: BloggerCourse.com  Fact Sheet for Healthcare Providers: SeriousBroker.it  This test is not yet approved or cleared by the [place] FDA and has been authorized for detection and/or diagnosis of SARS-CoV-2 by FDA under an Emergency Use Authorization (EUA). This EUA will remain in effect (meaning this test can be used) for the duration of the COVID-19 declaration under Section 564(b)(1) of the Act, 21 U.S.C. section 360bbb-3(b)(1), unless the authorization is terminated or revoked.  Performed at [facility], [address] [place], [place] [address]      Labs: CBC: Recent Labs  Lab 03/22/23 1230 03/23/23 0527 03/24/23 0636 03/25/23 0613 03/26/23 0415  WBC 4.7 5.1 8.4 6.8 7.6  HGB 9.5* 10.1* 10.0* 9.9* 10.7*  HCT 30.0* 31.0* 29.4* 30.5* 32.4*  MCV 101.0* 99.0 96.4 97.8 96.7  PLT 150 164 183 170 187   Basic Metabolic Panel: Recent Labs  Lab 03/22/23 1230 03/23/23 0527 03/24/23 0636 03/25/23 0613 03/26/23 0415  NA 144 142 141 141 141  K 3.8 3.6 3.7 3.3* 3.6  CL 102 98 96* 97* 93*  CO2 33* 32 34* 31 37*  GLUCOSE 105* 141* 127* 100* 122*  BUN 14 16 22  24* 28*  CREATININE 1.06* 0.98 1.04* 1.04*  1.05*  CALCIUM 8.9 8.7* 8.9 8.5* 8.6*  MG  --  2.1 2.3 2.2 2.3  PHOS  --  4.0 3.9 4.1 3.9   Liver Function Tests: Recent Labs  Lab 03/22/23 2117  AST 29  ALT 31  ALKPHOS 59  BILITOT 1.0  PROT 7.3  ALBUMIN 4.1   No results for input(s): "LIPASE", "AMYLASE" in the last 168 hours. No results for input(s): "AMMONIA" in the last 168 hours. Cardiac Enzymes: No results for input(s): "CKTOTAL", "CKMB", "CKMBINDEX", "TROPONINI" in the last 168 hours. BNP (last 3 results) Recent Labs    03/22/23 1230  BNP 474.5*   CBG: No results for input(s): "GLUCAP" in the last 168 hours.  Time spent: 35 minutes  Signed:  Gillis Santa  [facility] 03/26/2023 1:09 PM

## 2023-03-26 NOTE — Progress Notes (Signed)
Reviewed AVS with patient. All questions answered.

## 2023-03-26 NOTE — TOC Progression Note (Addendum)
Transition of Care [facility]) - Progression Note    Patient Details  Name: Michelle Jenkins MRN: [id number] Date of Birth: 1946-03-18  [facility] CM/SW Contact  Truddie Hidden, RN Phone Number: 03/26/2023, 11:29 AM  Clinical Narrative:    Spoke with patient regarding therapy's recommendation for [facility] PT/ OT. Patient is agreeable to [facility] PT and does not have a choice of an agency. Patient advised the accepting agency will contact her directly to scheduled SOC within 48 post discharge.  Referral sent and accepted by Adelina Mings from [facility].   Patient's daughter will transport her home and is aware to brin g her home oxygen tank.  Request for RW sent to Jon From [employer].    TOC signing off         Expected Discharge Plan and Services                                               Social Determinants of Health (SDOH) Interventions SDOH Screenings   Food Insecurity: No Food Insecurity (03/22/2023)  Housing: Patient Declined (03/26/2023)  Transportation Needs: No Transportation Needs (03/26/2023)  Utilities: Patient Declined (03/22/2023)  Alcohol Screen: Low Risk  (02/22/2021)  Depression (PHQ2-9): Medium Risk (03/01/2022)  Financial Resource Strain: Low Risk  (03/26/2023)  Physical Activity: Inactive (02/22/2021)  Social Connections: Unknown (02/22/2021)  Stress: No Stress Concern Present (02/22/2021)  Tobacco Use: Medium Risk (03/26/2023)    Readmission Risk Interventions     No data to display

## 2023-03-26 NOTE — Plan of Care (Signed)
  Problem: Education: Goal: Knowledge of General Education information will improve Description: Including pain rating scale, medication(s)/side effects and non-pharmacologic comfort measures Outcome: Progressing   Problem: Health Behavior/Discharge Planning: Goal: Ability to manage health-related needs will improve Outcome: Progressing   Problem: Clinical Measurements: Goal: Ability to maintain clinical measurements within normal limits will improve Outcome: Progressing Goal: Will remain free from infection Outcome: Progressing Goal: Diagnostic test results will improve Outcome: Progressing Goal: Respiratory complications will improve Outcome: Progressing Goal: Cardiovascular complication will be avoided Outcome: Progressing   Problem: Activity: Goal: Risk for activity intolerance will decrease Outcome: Progressing   Problem: Nutrition: Goal: Adequate nutrition will be maintained Outcome: Progressing   Problem: Coping: Goal: Level of anxiety will decrease Outcome: Progressing   Problem: Elimination: Goal: Will not experience complications related to bowel motility Outcome: Progressing Goal: Will not experience complications related to urinary retention Outcome: Progressing   Problem: Pain Management: Goal: General experience of comfort will improve Outcome: Progressing   Problem: Safety: Goal: Ability to remain free from injury will improve Outcome: Progressing   Problem: Skin Integrity: Goal: Risk for impaired skin integrity will decrease Outcome: Progressing   Problem: Education: Goal: Ability to demonstrate management of disease process will improve Outcome: Progressing Goal: Ability to verbalize understanding of medication therapies will improve Outcome: Progressing Goal: Individualized Educational Video(s) Outcome: Progressing   Problem: Activity: Goal: Capacity to carry out activities will improve Outcome: Progressing   Problem: Cardiac: Goal:  Ability to achieve and maintain adequate cardiopulmonary perfusion will improve Outcome: Progressing   Problem: Education: Goal: Knowledge of disease or condition will improve Outcome: Progressing Goal: Knowledge of the prescribed therapeutic regimen will improve Outcome: Progressing Goal: Individualized Educational Video(s) Outcome: Progressing   Problem: Activity: Goal: Ability to tolerate increased activity will improve Outcome: Progressing Goal: Will verbalize the importance of balancing activity with adequate rest periods Outcome: Progressing   Problem: Respiratory: Goal: Ability to maintain a clear airway will improve Outcome: Progressing Goal: Levels of oxygenation will improve Outcome: Progressing Goal: Ability to maintain adequate ventilation will improve Outcome: Progressing

## 2023-03-26 NOTE — Progress Notes (Signed)
[facility] CLINIC CARDIOLOGY PROGRESS NOTE       Patient ID: Michelle Jenkins MRN: [id number] DOB/AGE: [birth year] 77 y.o.  Admit date: 03/22/2023 Referring Physician Dr Lucianne Muss Primary Physician Larae Grooms, NP Primary Cardiologist kc Reason for Consultation AECHF  HPI: Michelle Jenkins is a 77 y.o. female  with a past medical history of A-fib on Eliquis, HFpE, CAD, h/o CVA, OSA on CPAP, PAH on ambrisentanon and home o2 at 2 L  who presented to the ED on 03/22/2023 for DOE. Patient had been having increasing oxygen demands with hypoxia on pulse ox. Additionally, she is complaining of LE edema, orthopnea, and PND. Patient is compliant with her Eliquis and has not missed any doses.   Interval history: -Patient seen and examined this AM, states she feels well overall. -Denies any SOB, on baseline O2 requirement. Laying flat in bed without orthopnea. -BP and HR controlled. Denies any chest pain, palpitations, dizziness.  -LE edema improved overall. Has chronic LLE edema since knee surgery.  Review of systems complete and found to be negative unless listed above     Past Medical History:  Diagnosis Date   Anginal pain (HCC)    Asthma    Bronchitis    CAD (coronary artery disease)    CHF (congestive heart failure) (HCC)    CVA (cerebral infarction)    Dysrhythmia    GERD (gastroesophageal reflux disease)    History of chronic atrial fibrillation    Hypercholesteremia    Hypertension    Idiopathic pulmonary hypertension (HCC)    Myocardial infarction (HCC)    Obesity    OSA (obstructive sleep apnea)    Pre-diabetes    Pulmonary artery hypertension (HCC)    Shortness of breath dyspnea    Sleep apnea    Stroke [facility])     Past Surgical History:  Procedure Laterality Date   BREAST BIOPSY Right 11/15/2015   stereo, COMPLEX SCLEROSING LESION WITH INTRADUCTAL PAPILLOMA COMPONENT AND    BREAST EXCISIONAL BIOPSY Right 02/27/2016   NEG   BREAST LUMPECTOMY WITH NEEDLE LOCALIZATION  Right 02/27/2016   Procedure: BREAST LUMPECTOMY WITH NEEDLE LOCALIZATION;  Surgeon: Kieth Brightly, MD;  Location: [facility] ORS;  Service: General;  Laterality: Right;   CARDIAC CATHETERIZATION     CARDIAC CATHETERIZATION Right 12/07/2014   Procedure: Right and Left Heart Cath with Coronary Angiography;  Surgeon: Dalia Heading, MD;  Location: [facility] INVASIVE CV LAB;  Service: Cardiovascular;  Laterality: Right;   CARPAL TUNNEL RELEASE  1998  and 1999   CATARACT EXTRACTION W/PHACO Right 09/24/2019   Procedure: CATARACT EXTRACTION PHACO AND INTRAOCULAR LENS PLACEMENT (IOC) RIGHT;  Surgeon: Galen Manila, MD;  Location: [facility] ORS;  Service: Ophthalmology;  Laterality: Right;  [id number]:44.8 us6.96 [id number]   COLONOSCOPY  10/31/1998   DILATION AND CURETTAGE OF UTERUS     FOOT SURGERY Right    HYSTEROSCOPY     JOINT REPLACEMENT Left    KNEE ARTHROSCOPY AND ARTHROTOMY Right    lt knee replacement     TONSILLECTOMY     UVULOPALATOPHARYNGOPLASTY      Medications Prior to Admission  Medication Sig Dispense Refill Last Dose   ambrisentan (LETAIRIS) 10 MG tablet Take 10 mg by mouth daily.   Past Week   apixaban (ELIQUIS) 5 MG TABS tablet Take 1 tablet (5 mg total) by mouth 2 (two) times daily. 180 tablet 1 Past Week   atorvastatin (LIPITOR) 20 MG tablet Take 1 tablet daily 90 tablet 1 Past Week  ezetimibe (ZETIA) 10 MG tablet Take by mouth daily 90 tablet 1 Past Week   furosemide (LASIX) 20 MG tablet Take 1 tablet (20 mg total) by mouth 2 (two) times daily. TAKE 1 TABLET(20 MG) BY MOUTH TWICE DAILY AS NEEDED 180 tablet 1 Past Week   gabapentin (NEURONTIN) 300 MG capsule Take 2 capsules at bedtime. 180 capsule 3 Past Week   labetalol (NORMODYNE) 200 MG tablet Take 1 tablet by mouth twice daily 180 tablet 1 Past Week   olmesartan (BENICAR) 20 MG tablet Take 1 tablet (20 mg total) by mouth daily. 90 tablet 1 Past Week   pantoprazole (PROTONIX) 40 MG tablet TAKE 1 TABLET BY MOUTH EVERY DAY 180  tablet 1 Past Week   ondansetron (ZOFRAN ODT) 4 MG disintegrating tablet Take 1 tablet (4 mg total) by mouth every 8 (eight) hours as needed for nausea or vomiting. (Patient not taking: Reported on 03/25/2023) 20 tablet 0 Not Taking   Psyllium (METAMUCIL FIBER PO) Take 1 Dose by mouth. Patient uses Metamucil Powder in the AM   prn   VENTOLIN HFA 108 (90 Base) MCG/ACT inhaler    prn   Social History   Socioeconomic History   Marital status: Single    Spouse name: Not on file   Number of children: 4   Years of education: Not on file   Highest education level: 8th grade  Occupational History   Occupation: retired  Tobacco Use   Smoking status: Former    Current packs/day: 0.00    Types: Cigarettes    Quit date: 06/15/2014    Years since quitting: 8.7   Smokeless tobacco: Never   Tobacco comments:    4-5 cigarettes a day when she smoked  Vaping Use   Vaping status: Never Used  Substance and Sexual Activity   Alcohol use: Yes    Alcohol/week: 0.0 - 1.0 standard drinks of alcohol    Comment: occasionally   Drug use: No   Sexual activity: Not Currently  Other Topics Concern   Not on file  Social History Narrative   Not on file   Social Determinants of Health   Financial Resource Strain: Low Risk  (03/26/2023)   Overall Financial Resource Strain (CARDIA)    Difficulty of Paying Living Expenses: Not hard at all  Food Insecurity: No Food Insecurity (03/22/2023)   Hunger Vital Sign    Worried About Running Out of Food in the Last Year: Never true    Ran Out of Food in the Last Year: Never true  Transportation Needs: No Transportation Needs (03/26/2023)   PRAPARE - Administrator, Civil Service (Medical): No    Lack of Transportation (Non-Medical): No  Physical Activity: Inactive (02/22/2021)   Exercise Vital Sign    Days of Exercise per Week: 0 days    Minutes of Exercise per Session: 0 min  Stress: No Stress Concern Present (02/22/2021)   [employer] of  Occupational Health - Occupational Stress Questionnaire    Feeling of Stress : Not at all  Social Connections: Unknown (02/22/2021)   Social Connection and Isolation Panel [NHANES]    Frequency of Communication with Friends and Family: Three times a week    Frequency of Social Gatherings with Friends and Family: Three times a week    Attends Religious Services: 1 to 4 times per year    Active Member of Clubs or Organizations: No    Attends Banker Meetings: Never    Marital Status:  Not on file  Intimate Partner Violence: Not At Risk (03/22/2023)   Humiliation, Afraid, Rape, and Kick questionnaire    Fear of Current or Ex-Partner: No    Emotionally Abused: No    Physically Abused: No    Sexually Abused: No    Family History  Problem Relation Age of Onset   Diabetes Mother    Alzheimer's disease Mother    Throat cancer Brother    Diabetes Brother    Cancer Brother        lung   Hypertension Brother    Cancer Daughter 1       metastatic uterine PECOMA to the liver and lungs   Rheumatic fever Daughter    Diabetes Sister    Hypertension Sister    COPD Neg Hx    Heart disease Neg Hx    Stroke Neg Hx    Breast cancer Neg Hx      Vitals:   03/25/23 0734 03/25/23 1159 03/25/23 1720 03/26/23 0915  BP: 123/64 138/85 121/71 (!) 158/70  Pulse: 64 (!) 53 92 62  Resp: 20 17 16 17   Temp: 98.2 F (36.8 C) 97.7 F (36.5 C) 98.6 F (37 C) 98 F (36.7 C)  TempSrc: Oral     SpO2: 100% 96% 100% 97%  Weight:      Height:        PHYSICAL EXAM General: awake, well nourished, in no acute distress laying nearly flat in hospital bed. HEENT: Normocephalic and atraumatic. Neck: No JVD.  Lungs: Normal respiratory effort on baseline O2 requirement. Clear bilaterally to auscultation. No wheezes, crackles, rhonchi.  Heart: HRRR. Normal S1 and S2 without gallops or murmurs.  Abdomen: Non-distended appearing.  Msk: Normal strength and tone for age. Extremities: Warm and well  perfused. No clubbing, cyanosis. 1+ edema LLE, trace edema RLE.  Neuro: Alert and oriented X 3. Psych: Answers questions appropriately.   Labs: Basic Metabolic Panel: Recent Labs    03/25/23 0613 03/26/23 0415  NA 141 141  K 3.3* 3.6  CL 97* 93*  CO2 31 37*  GLUCOSE 100* 122*  BUN 24* 28*  CREATININE 1.04* 1.05*  CALCIUM 8.5* 8.6*  MG 2.2 2.3  PHOS 4.1 3.9   Liver Function Tests: No results for input(s): "AST", "ALT", "ALKPHOS", "BILITOT", "PROT", "ALBUMIN" in the last 72 hours.  No results for input(s): "LIPASE", "AMYLASE" in the last 72 hours. CBC: Recent Labs    03/25/23 0613 03/26/23 0415  WBC 6.8 7.6  HGB 9.9* 10.7*  HCT 30.5* 32.4*  MCV 97.8 96.7  PLT 170 187   Cardiac Enzymes: No results for input(s): "CKTOTAL", "CKMB", "CKMBINDEX", "TROPONINIHS" in the last 72 hours.  BNP: No results for input(s): "BNP" in the last 72 hours.  D-Dimer: No results for input(s): "DDIMER" in the last 72 hours. Hemoglobin A1C: No results for input(s): "HGBA1C" in the last 72 hours. Fasting Lipid Panel: No results for input(s): "CHOL", "HDL", "LDLCALC", "TRIG", "CHOLHDL", "LDLDIRECT" in the last 72 hours. Thyroid Function Tests: No results for input(s): "TSH", "T4TOTAL", "T3FREE", "THYROIDAB" in the last 72 hours.  Invalid input(s): "FREET3" Anemia Panel: No results for input(s): "VITAMINB12", "FOLATE", "FERRITIN", "TIBC", "IRON", "RETICCTPCT" in the last 72 hours.    Radiology: ECHOCARDIOGRAM COMPLETE  Result Date: 03/25/2023    ECHOCARDIOGRAM REPORT   Patient Name:   Michelle Jenkins Date of Exam: 03/23/2023 Medical Rec #:  [id number]       Height:       65.0 in Accession #:  [id number]      Weight:       308.4 lb Date of Birth:  [birth year]       BSA:          2.378 m Patient Age:    77 years        BP:           142/72 mmHg Patient Gender: F               HR:           81 bpm. Exam Location:  [facility] Procedure: 2D Echo, Cardiac Doppler and Color Doppler Indications:      CHF-Acute Diastolic I50.31  History:         Patient has no prior history of Echocardiogram examinations.                  CHF; CAD.  Sonographer:     Neysa Bonito Roar Referring Phys:  [id number] Andris Baumann Diagnosing Phys: Alwyn Pea MD IMPRESSIONS  1. Left ventricular ejection fraction, by estimation, is 60 to 65%. The left ventricle has normal function. The left ventricle has no regional wall motion abnormalities. The left ventricular internal cavity size was mildly dilated. There is mild concentric left ventricular hypertrophy. Left ventricular diastolic parameters are consistent with Grade I diastolic dysfunction (impaired relaxation).  2. Right ventricular systolic function is normal. The right ventricular size is normal.  3. Left atrial size was moderately dilated.  4. Right atrial size was mildly dilated.  5. The mitral valve is normal in structure. Mild to moderate mitral valve regurgitation.  6. The aortic valve is normal in structure. Aortic valve regurgitation is trivial.  7. Evidence of atrial level shunting detected by color flow Doppler. FINDINGS  Left Ventricle: Left ventricular ejection fraction, by estimation, is 60 to 65%. The left ventricle has normal function. The left ventricle has no regional wall motion abnormalities. The left ventricular internal cavity size was mildly dilated. There is  mild concentric left ventricular hypertrophy. Left ventricular diastolic parameters are consistent with Grade I diastolic dysfunction (impaired relaxation). Right Ventricle: The right ventricular size is normal. No increase in right ventricular wall thickness. Right ventricular systolic function is normal. Left Atrium: Left atrial size was moderately dilated. Right Atrium: Right atrial size was mildly dilated. Pericardium: There is no evidence of pericardial effusion. Mitral Valve: The mitral valve is normal in structure. Mild to moderate mitral valve regurgitation. MV peak gradient, 21.9 mmHg. The mean  mitral valve gradient is 8.0 mmHg. Tricuspid Valve: The tricuspid valve is normal in structure. Tricuspid valve regurgitation is mild. Aortic Valve: The aortic valve is normal in structure. Aortic valve regurgitation is trivial. Aortic regurgitation PHT measures 418 msec. Aortic valve mean gradient measures 10.5 mmHg. Aortic valve peak gradient measures 21.3 mmHg. Aortic valve area, by VTI measures 1.90 cm. Pulmonic Valve: The pulmonic valve was normal in structure. Pulmonic valve regurgitation is trivial. Aorta: The ascending aorta was not well visualized. IAS/Shunts: Evidence of atrial level shunting detected by color flow Doppler.  LEFT VENTRICLE PLAX 2D LVIDd:         5.30 cm   Diastology LVIDs:         3.50 cm   LV e' medial:    7.18 cm/s LV PW:         1.20 cm   LV E/e' medial:  23.8 LV IVS:        1.20 cm   LV e' lateral:  12.40 cm/s LVOT diam:     1.80 cm   LV E/e' lateral: 13.8 LV SV:         83 LV SV Index:   35 LVOT Area:     2.54 cm  RIGHT VENTRICLE RV Basal diam:  3.60 cm RV Mid diam:    3.30 cm RV S prime:     15.20 cm/s TAPSE (M-mode): 3.0 cm LEFT ATRIUM              Index        RIGHT ATRIUM           Index LA diam:        5.70 cm  2.40 cm/m   RA Area:     24.40 cm LA Vol (A2C):   194.0 ml 81.58 ml/m  RA Volume:   68.60 ml  28.85 ml/m LA Vol (A4C):   175.0 ml 73.59 ml/m LA Biplane Vol: 187.0 ml 78.64 ml/m  AORTIC VALVE                     PULMONIC VALVE AV Area (Vmax):    1.79 cm      PV Vmax:          1.17 m/s AV Area (Vmean):   2.04 cm      PV Peak grad:     5.5 mmHg AV Area (VTI):     1.90 cm      PR End Diast Vel: 7.84 msec AV Vmax:           230.50 cm/s   RVOT Peak grad:   3 mmHg AV Vmean:          145.000 cm/s AV VTI:            0.436 m AV Peak Grad:      21.3 mmHg AV Mean Grad:      10.5 mmHg LVOT Vmax:         162.00 cm/s LVOT Vmean:        116.000 cm/s LVOT VTI:          0.325 m LVOT/AV VTI ratio: 0.75 AI PHT:            418 msec  AORTA Ao Root diam: 2.80 cm Ao Asc diam:  3.20 cm  MITRAL VALVE                TRICUSPID VALVE MV Area (PHT): 4.46 cm     TR Peak grad:   43.6 mmHg MV Area VTI:   1.89 cm     TR Vmax:        330.00 cm/s MV Peak grad:  21.9 mmHg MV Mean grad:  8.0 mmHg     SHUNTS MV Vmax:       2.34 m/s     Systemic VTI:  0.32 m MV Vmean:      124.0 cm/s   Systemic Diam: 1.80 cm MV Decel Time: 170 msec MV E velocity: 171.00 cm/s Alwyn Pea MD Electronically signed by Alwyn Pea MD Signature Date/Time: 03/25/2023/8:09:53 AM    Final    CT CHEST WO CONTRAST  Result Date: 03/23/2023 CLINICAL DATA:  Dyspnea. EXAM: CT CHEST WITHOUT CONTRAST TECHNIQUE: Multidetector CT imaging of the chest was performed following the standard protocol without IV contrast. RADIATION DOSE REDUCTION: This exam was performed according to the departmental dose-optimization program which includes automated exposure control, adjustment of the mA and/or kV according to patient size and/or  use of iterative reconstruction technique. COMPARISON:  None Available. FINDINGS: Cardiovascular: Cardiomegaly. Small pericardial effusion. Atheromatous calcifications of the aorta and coronary arteries. Mediastinum/Nodes: Postop changes consistent with right thyroidectomy. No suspicious mediastinal, hilar or axillary adenopathy identified. Right paratracheal 9 mm node seen. Lungs/Pleura: Dependent subsegmental atelectasis at the lung bases. Alveolar consolidation right lower lobe could represent a pneumonia. Follow up to confirm resolution recommended. Upper Abdomen: No acute abnormality. Musculoskeletal: No chest wall mass or suspicious bone lesions identified. IMPRESSION: 1. Right lower lobe consolidation. Follow up to confirm resolution recommended. 2. Cardiomegaly. Small pericardial effusion. 3. Aortic and coronary artery atherosclerosis (ICD10-I70.0). Electronically Signed   By: Layla Maw M.D.   On: 03/23/2023 10:41   DG Chest 2 View  Result Date: 03/22/2023 CLINICAL DATA:  Chest pain.   Shortness of breath. EXAM: CHEST - 2 VIEW COMPARISON:  03/31/2022 FINDINGS: Lateral view degraded by patient arm position. Frontal view degraded by patient body habitus. Apical lordotic positioning. AP technique. Midline trachea. Marked enlargement of the cardiac/pericardial shadow. No gross pleural effusion. No pneumothorax. Pulmonary venous congestion. No overt congestive failure. No lobar consolidation. IMPRESSION: Both views are technique degraded as detailed above. Marked enlargement of the cardiopericardial silhouette could represent cardiomegaly and/or pericardial effusion. This is relatively similar. Pulmonary venous congestion without overt congestive failure. Electronically Signed   By: Jeronimo Greaves M.D.   On: 03/22/2023 14:48    ECHO as above  TELEMETRY reviewed by me [facility]) 03/26/2023 : atrial fibrillation rate 60s  EKG reviewed by me: Afib, rate controlled at 79 bpm. Non-specific ST & T wave abnl  Data reviewed by me [facility]) 03/26/2023: last 24h vitals tele labs imaging I/O provider notes  Principal Problem:   Acute on chronic diastolic CHF (congestive heart failure) (HCC) Active Problems:   Obstructive apnea   Primary pulmonary hypertension (HCC)   CAD in native artery   Hypertension   Atrial fibrillation (HCC)   Cardiomegaly vs pericardial effusion on CXR   COPD with acute exacerbation (HCC)   Acute on chronic respiratory failure with hypoxia (HCC)   Obesity, Class III, BMI 40-49.9 (morbid obesity) (HCC)   Chronic anticoagulation   Anemia   History of CVA (cerebrovascular accident)   Diastolic CHF, acute on chronic (HCC)    ASSESSMENT AND PLAN:   # Acute on chronic HFpEF Patient with worsening SOB, LE edema prior to admission. BNP elevated at 474. On chronic supplemental O2 but was noted to by hypoxic. Echo this admission with EF 60-65%, mild concentric LVH, grade I diastolic dysfunction, mild to moderate MR. -Continue lasix 40 mg daily. -Continue jardiance 10 mg daily,  irbesartan 75 mg daily, metoprolol as below.  -Optimize lytes, K>4 and Mag>2. -Low sodium diet, strict I&Os. -Trend daily weights and fluid intake.  # Chronic atrial fibrillation Patient with hx of AF, rate better controlled with reduced dose of metoprolol. -Continue metoprolol succinate 12.5 once daily.  -Continue eliquis 5 mg twice daily for stroke risk reduction. -Monitor on telemetry  # Hypertension # CAD # H/o CVA No evidence of ACS at this time -Continue atorvastatin 40 mg daily.   # A/C hypoxic respiratory failure likely multifactorial Patient has PAH, COPD and OSA in addition to AECHF at this time. -See plan for AoCHF above.   Ok for discharge today from a cardiac perspective. Will arrange for follow up in clinic with Dr. Melton Alar in 1-2 weeks.    This patient's plan of care was discussed and created with Dr. Juliann Pares and he is  in agreement.    Signed: Gale Journey, PA-C 03/26/2023, 10:30 AM [facility]

## 2023-03-26 NOTE — Progress Notes (Signed)
Education Assessment and Provision:  Per Fermin Schwab, PA-C patient would benefit from Heart Failure Education. Patient will [facility] with [facility].  Detailed education and instructions provided on heart failure disease management including the following:  Signs and symptoms of Heart Failure When to call the physician Importance of daily weights Low sodium diet Fluid restriction Medication management Anticipated future follow-up appointments  Patient education given on each of the above topics.  Patient acknowledges understanding via teach back method and acceptance of all instructions.  Education Materials:  "Living Better With Heart Failure" Booklet, HF zone tool, & Daily Weight Tracker Tool.  Patient has scale at home: Yes Patient has pill box at home: Yes    Navigator will sign off at this time.  Roxy Horseman, RN, BSN [facility] Heart Failure Navigator Secure Chat Only

## 2023-03-28 ENCOUNTER — Telehealth: Payer: Self-pay

## 2023-03-28 NOTE — Transitions of Care (Post Inpatient/ED Visit) (Signed)
   03/28/2023  Name: Michelle Jenkins MRN: [id number] DOB: [birth year]  Today's TOC FU Call Status: Today's TOC FU Call Status:: Unsuccessful Call (1st Attempt) Unsuccessful Call (1st Attempt) Date: 03/28/23  Attempted to reach the patient regarding the most recent Inpatient/ED visit.Spoke with patient who requested a call back and to speak with daughter who was NA at time   Follow Up Plan: Additional outreach attempts will be made to reach the patient to complete the Transitions of Care (Post Inpatient/ED visit) call.   Susa Loffler , BSN, RN Care Management Coordinator [place]   [facility] [email or web address] Direct Dial: [phone]

## 2023-03-29 ENCOUNTER — Telehealth: Payer: Self-pay | Admitting: Nurse Practitioner

## 2023-03-29 ENCOUNTER — Telehealth: Payer: Self-pay

## 2023-03-29 DIAGNOSIS — I5033 Acute on chronic diastolic (congestive) heart failure: Secondary | ICD-10-CM

## 2023-03-29 DIAGNOSIS — J441 Chronic obstructive pulmonary disease with (acute) exacerbation: Secondary | ICD-10-CM

## 2023-03-29 NOTE — Telephone Encounter (Signed)
Neysa Bonito, case manager with [facility], has called and states that she has spoke with patient and that the patient did not get home health when she left the hospital, but Neysa Bonito states she would benefit from it. Patient does have to use a walker around the house. Neysa Bonito is requesting a referral for nursing for disease & medication management and referral for PT & OT for the patient. Neysa Bonito states patient also needs a shower chair, that hopefully home health could assist with as well as other home health equipment.  Patient has a HFU scheduled for 04/04/2023, patient was originally scheduled for 04/23/2023 but changed patient's appointment per Christy's request.  Per Marisue Ivan is the preferred Home Health for patient. [facility] Phone # [phone] Eileen Stanford is the clinical [facility])  Neysa Bonito Case [facility] Callback # [phone]

## 2023-03-29 NOTE — Telephone Encounter (Signed)
Referral placed for home health

## 2023-03-29 NOTE — Transitions of Care (Post Inpatient/ED Visit) (Signed)
03/29/2023  Name: Michelle Jenkins MRN: [id number] DOB: October 16, 1945  Today's TOC FU Call Status:   Patient's Name and Date of Birth confirmed.  Transition Care Management Follow-up Telephone Call    Items Reviewed: Did you receive and understand the discharge instructions provided?: Yes Medications obtained,verified, and reconciled?: Yes (Medications Reviewed) Any new allergies since your discharge?: No Dietary orders reviewed?: Yes Type of Diet Ordered:: Heart Healthy Renal 1.5 l Fluid restriction Do you have support at home?: No (lives alone, currently daughter assisting until 12/22)  Medications Reviewed Today: Medications Reviewed Today     Reviewed by Johnnette Barrios, RN (Registered Nurse) on 03/29/23 at 1045  Med List Status: <None>   Medication Order Taking? Sig Documenting Provider Last Dose Status Informant  ambrisentan (LETAIRIS) 10 MG tablet [id number] Yes Take 10 mg by mouth daily. [provider] Taking Active   apixaban (ELIQUIS) 5 MG TABS tablet [id number] Yes Take 1 tablet (5 mg total) by mouth 2 (two) times daily. Larae Grooms, NP Taking Active   atorvastatin (LIPITOR) 20 MG tablet [id number] Yes Take 1 tablet daily Larae Grooms, NP Taking Active   empagliflozin (JARDIANCE) 10 MG TABS tablet [id number] Yes Take 1 tablet (10 mg total) by mouth daily. Gillis Santa, MD Taking Active   ezetimibe (ZETIA) 10 MG tablet [id number] Yes Take by mouth daily Larae Grooms, NP Taking Active   furosemide (LASIX) 40 MG tablet [id number] Yes Take 1 tablet (40 mg total) by mouth daily. Gillis Santa, MD Taking Active   gabapentin (NEURONTIN) 300 MG capsule [id number] Yes Take 2 capsules at bedtime. Larae Grooms, NP Taking Active   metoprolol succinate (TOPROL-XL) 25 MG 24 hr tablet [id number] Yes Take 0.5 tablets (12.5 mg total) by mouth daily. Gillis Santa, MD Taking Active   olmesartan [facility]) 20 MG tablet [id number] Yes Take 1 tablet (20 mg total) by mouth  daily. Larae Grooms, NP Taking Active   pantoprazole (PROTONIX) 40 MG tablet [id number] Yes TAKE 1 TABLET BY MOUTH EVERY DAY Larae Grooms, NP Taking Active   Psyllium (METAMUCIL FIBER PO) [id number] Yes Take 1 Dose by mouth. Patient uses Metamucil Powder in the AM [provider] Taking Active   VENTOLIN HFA 108 (90 Base) MCG/ACT inhaler [id number] Yes  [provider] Taking Active             Home Care and Equipment/Supplies: Were Home Health Services Ordered?: No (Called PCP office to request referral for Nursing PT and OT) Any new equipment or medical supplies ordered?: No (reports she will need shower chair)  Functional Questionnaire: Do you need assistance with bathing/showering or dressing?: Yes (unable ro get nito tub/ shower) Do you need assistance with meal preparation?: Yes (occ gets SOB during meal prep) Do you need assistance with eating?: No Do you have difficulty maintaining continence: No Do you need assistance with getting out of bed/getting out of a chair/moving?: Yes (uses walker or cane at times, reports pain right  knee making teansfers difficult) Do you have difficulty managing or taking your medications?: Yes (daughter set up pill boxes due to recent changes, concerns r/t patient being able to manage all meds per schedule Recc Blister packs/ Bingo cards)  Follow up appointments reviewed: PCP Follow-up appointment confirmed?: Yes Date of PCP follow-up appointment?: 04/04/23 Follow-up Provider: Larae Grooms, per [facility] 04/23/23 , they are working on appointment verification Specialist Hospital Follow-up appointment confirmed?: Yes Date of Specialist follow-up appointment?: 04/23/23 Follow-Up Specialty Provider:: Cardiology Daughter is Verifying Do you need  transportation to your follow-up appointment?: Yes Transportation Need Intervention Addressed By:: Other: (Daughter will transport to appt , otherwise she uses [employer]) Do you  understand care options if your condition(s) worsen?: Yes-patient verbalized understanding  SDOH Interventions Today    Flowsheet Row Most Recent Value  SDOH Interventions   Food Insecurity Interventions Intervention Not Indicated  Housing Interventions Intervention Not Indicated  Transportation Interventions Intervention Not Indicated  Utilities Interventions Intervention Not Indicated       Goals Addressed             This Visit's Progress    TOC Care Plan       Current Barriers:  Care Coordination needs related to Limited social support, Level of care concerns, ADL IADL limitations, Inability to perform ADL's independently, and Inability to perform IADL's independently Chronic Disease Management support and education needs related to CHF   RNCM Clinical Goal(s):  Patient will work with the Care Management team over the next 30 days to address Transition of Care Barriers: Medication Management Support at home Provider appointments Home Health services take all medications exactly as prescribed and will call provider for medication related questions as evidenced by no missed medications no unmanaged side effects  attend all scheduled medical appointments: with PCP, Cardiology and Home Health  as evidenced by no missed appointments  work with Child psychotherapist to address  related to the management of Limited social support, Transportation, Level of care concerns, and ADL IADL limitations related to the management of CHF as evidenced by review of EMR and patient or Child psychotherapist report through collaboration with Medical illustrator, provider, and care team.   Interventions: Evaluation of current treatment plan related to  self management and patient's adherence to plan as established by provider  Transitions of Care:  New goal. Doctor Visits  - discussed the importance of doctor visits Contacted Health RN/OT/PT Frances Furbish [phone]-requested a referral thru PCP office  Post discharge  activity limitations prescribed by provider reviewed  Heart Failure Interventions:  (Status:  New goal.) Short Term Goal Basic overview and discussion of pathophysiology of Heart Failure reviewed Provided education on low sodium diet Assessed need for readable accurate scales in home Provided education about placing scale on hard, flat surface Discussed importance of daily weight and advised patient to weigh and record daily Reviewed role of diuretics in prevention of fluid overload and management of heart failure; Discussed the importance of keeping all appointments with provider Provided patient with education about the role of exercise in the management of heart failure  Patient Goals/Self-Care Activities: Participate in Transition of Care Program/Attend TOC scheduled calls Take all medications as prescribed Attend all scheduled provider appointments Call pharmacy for medication refills 3-7 days in advance of running out of medications Perform all self care activities independently  Perform IADL's (shopping, preparing meals, housekeeping, managing finances) independently Call provider office for new concerns or questions   Follow Up Plan:  Telephone follow up appointment with care management team member scheduled for:  04/02/23 10:30am The patient has been provided with contact information for the care management team and has been advised to call with any health related questions or concerns.  Follow up with provider re: follow-up appointments , Home Health and DME          Patient is at high risk for readmission and/or has history of  high utilization  Discussed [facility]  TOC program and weekly calls to patient to assess condition/status, medication management  and provide  support/education as indicated . Patient/ Caregiver voiced understanding and is  agreeable to 30 day program    Routine follow-up and on-going assessment evaluation and education of disease processes, recommended  interventions for both chronic and acute medical conditions , will occur during each weekly visit along with ongoing review of symptoms ,medication reviews and reconciliation. Any updates , inconsistencies, discrepancies or acute care concerns will be addressed and routed to the correct Practitioner if indicated   Based on current information and Insurance plan -Reviewed benefits available to patient, including details about eligibility options for care if any area of needs were identified.  Reviewed patients ability to access and / or navigating the benefits system..Amb Referral made if indicted , refer to orders section of note for details  Call placed to PCP office verified appt for 12/19 11am, also request for [facility], PT/ OT   Please refer to Care Plan for goals and interventions -Effectiveness of interventions, symptom management and outcomes will be evaluated  weekly during [facility] 30-day Program Outreach calls  . Any necessary  changes and updates to Care Plan will be completed episodically    Reviewed goals for care Patient verbalizes understanding of instructions and care plan provided. Patient was encouraged to make informed decisions about their care, actively participate in managing their health condition, and implement lifestyle changes as needed to promote independence and self-management of health care    The patient has been provided with contact information for the care management team and has been advised to call with any health-related questions or concerns.    Susa Loffler , BSN, RN Care Management Coordinator [place]   [facility] [email or web address] Direct Dial: [phone]

## 2023-03-29 NOTE — Patient Instructions (Signed)
Visit Information  Thank you for taking time to visit with me today. Please don't hesitate to contact me if I can be of assistance to you before our next scheduled telephone appointment.  Our next appointment is by telephone on 11/17 at 10:30am   Following is a copy of your care plan:   Goals Addressed             This Visit's Progress    TOC Care Plan       Current Barriers:  Care Coordination needs related to Limited social support, Level of care concerns, ADL IADL limitations, Inability to perform ADL's independently, and Inability to perform IADL's independently Chronic Disease Management support and education needs related to CHF   RNCM Clinical Goal(s):  Patient will work with the Care Management team over the next 30 days to address Transition of Care Barriers: Medication Management Support at home Provider appointments Home Health services take all medications exactly as prescribed and will call provider for medication related questions as evidenced by no missed medications no unmanaged side effects  attend all scheduled medical appointments: with PCP, Cardiology and Home Health  as evidenced by no missed appointments  work with Child psychotherapist to address  related to the management of Limited social support, Transportation, Level of care concerns, and ADL IADL limitations related to the management of CHF as evidenced by review of EMR and patient or Child psychotherapist report through collaboration with Medical illustrator, provider, and care team.   Interventions: Evaluation of current treatment plan related to  self management and patient's adherence to plan as established by provider  Transitions of Care:  New goal. Doctor Visits  - discussed the importance of doctor visits Contacted Health RN/OT/PT Frances Furbish [phone]-requested a referral thru PCP office  Post discharge activity limitations prescribed by provider reviewed  Heart Failure Interventions:  (Status:  New goal.) Short  Term Goal Basic overview and discussion of pathophysiology of Heart Failure reviewed Provided education on low sodium diet Assessed need for readable accurate scales in home Provided education about placing scale on hard, flat surface Discussed importance of daily weight and advised patient to weigh and record daily Reviewed role of diuretics in prevention of fluid overload and management of heart failure; Discussed the importance of keeping all appointments with provider Provided patient with education about the role of exercise in the management of heart failure  Patient Goals/Self-Care Activities: Participate in Transition of Care Program/Attend TOC scheduled calls Take all medications as prescribed Attend all scheduled provider appointments Call pharmacy for medication refills 3-7 days in advance of running out of medications Perform all self care activities independently  Perform IADL's (shopping, preparing meals, housekeeping, managing finances) independently Call provider office for new concerns or questions   Follow Up Plan:  Telephone follow up appointment with care management team member scheduled for:  04/02/23 10:30am The patient has been provided with contact information for the care management team and has been advised to call with any health related questions or concerns.  Follow up with provider re: follow-up appointments , Home Health and DME          Reviewed goals for care Patient verbalizes understanding of instructions and care plan provided. Patient was encouraged to make informed decisions about their care, actively participate in managing their health condition, and implement lifestyle changes as needed to promote independence and self-management of health care   Routine follow-up and on-going assessment evaluation and education of disease processes, recommended interventions for  both chronic and acute medical conditions , will occur during each weekly visit  along with ongoing review of symptoms ,medication reviews and reconciliation. Any updates , inconsistencies, discrepancies or acute care concerns will be addressed and routed to the correct Practitioner if indicated    Please call the care guide team at [phone] if you need to cancel or reschedule your appointment.   Please call the [place] National Suicide Prevention Lifeline: 732-831-6764 or TTY: 940-657-9640 TTY (938)804-7793) to talk to a trained counselor if you are experiencing a Mental Health or Behavioral Health Crisis or need someone to talk to.  Susa Loffler , BSN, RN Care Management Coordinator [place]   [facility] [email or web address] Direct Dial: [phone]

## 2023-04-02 ENCOUNTER — Other Ambulatory Visit: Payer: 59

## 2023-04-02 NOTE — Patient Outreach (Signed)
Care Management  Transitions of Care Program Transitions of Care Post-discharge week 2   04/02/2023 Name: Michelle Jenkins MRN: [id number] DOB: 09/26/1945  Subjective: Michelle Jenkins is a 77 y.o. year old female who is a primary care patient of Larae Grooms, NP. The Care Management team Engaged with patient Engaged with patient by telephone to assess and address transitions of care needs.   Consent to Services:  Patient was given information about care management services, agreed to services, and gave verbal consent to participate.   Assessment:   Patient voices no new complaints Patient has not developed/ reported any new Medical issues / Dx or acute changes.- since last follow-up call for most recent  Hospital stay    12/6-12/10/ 2024  She is being seen by [facility] today for [facility]. Overall doing better, See by Pulmonology 12/13 See updated recommendations Uses O2 @ 3 l/m PRN has portable tanks. Enc to wear CPAP at night Daughter remains with patient but will be returning home 12/22. Patient reports ongoing knee pain and plans to ha ve surgery scheduled per Orthopedics she needs to lose 30 lbs for procedure. Weight today 285, which is up from last week. She does report not following diet recommendations well. Recc they request Nutrition Consult when visiting PCP 12/19  Patient / Caregiver educated on red flag s/s to watch for and was encouraged to report, any changes in baseline or  medication regimen,  changes in health status  /  well-being, safety concerns  or any new unmanaged side effects or symptoms not relieved with interventions  to PCP and / or the  [facility] Case Management team  . Daughter requested she have on-going case management services past the 30 dsys TOC Program if available.         SDOH Interventions    Flowsheet Row Telephone from 03/29/2023 in [place]PART[facility] ED to Hosp-Admission (Discharged) from 03/22/2023 in [facility] PCU  Clinical Support from 02/22/2021 in [facility] Office Visit from 11/03/2020 in [facility] Office Visit from 01/05/2019 in [facility]  SDOH Interventions       Food Insecurity Interventions Intervention Not Indicated -- Intervention Not Indicated -- --  Housing Interventions Intervention Not Indicated Intervention Not Indicated Intervention Not Indicated -- --  Transportation Interventions Intervention Not Indicated Intervention Not Indicated Intervention Not Indicated -- --  Utilities Interventions Intervention Not Indicated -- -- -- --  Depression Interventions/Treatment  -- -- -- Medication Patient refuses Treatment  Financial Strain Interventions -- Intervention Not Indicated Intervention Not Indicated -- --  Stress Interventions -- -- Intervention Not Indicated -- --  Social Connections Interventions -- -- Intervention Not Indicated -- --        Goals Addressed             This Visit's Progress    TOC Care Plan       Current Barriers:  Care Coordination needs related to Limited social support, Level of care concerns, ADL IADL limitations, Inability to perform ADL's independently, and Inability to perform IADL's independently Chronic Disease Management support and education needs related to CHF   RNCM Clinical Goal(s):  Patient will work with the Care Management team over the next 30 days to address Transition of Care Barriers: Medication Management Support at home Provider appointments Home Health services take all medications exactly as prescribed and will call provider for medication related questions as evidenced by no  missed medications no unmanaged side effects  attend all scheduled medical appointments: with PCP, Cardiology and Home Health  as evidenced by no missed appointments  work with Child psychotherapist to address  related to the management of Limited social support, Transportation, Level of care  concerns, and ADL IADL limitations related to the management of CHF as evidenced by review of EMR and patient or Child psychotherapist report through collaboration with Medical illustrator, provider, and care team.   Interventions: Evaluation of current treatment plan related to  self management and patient's adherence to plan as established by provider-Pulmonology f/u 12/13, continue CPAP @ night, O2 3 L/M PRN, enc weight  loss   Transitions of Care:  Goal on track:  Yes. Doctor Visits  - discussed the importance of doctor visits Contacted Health RN/OT/PT Frances Furbish [phone] visit scheduled for today   Post discharge activity limitations prescribed by provider reviewed  Heart Failure Interventions:  (Status: On Track  ) Short Term Goal Basic overview and discussion of pathophysiology of Heart Failure reviewed Provided education on low sodium diet Assessed need for readable accurate scales in home Provided education about placing scale on hard, flat surface Discussed importance of daily weight and advised patient to weigh and record daily Reviewed role of diuretics in prevention of fluid overload and management of heart failure; Discussed the importance of keeping all appointments with provider Provided patient with education about the role of exercise in the management of heart failure  Patient Goals/Self-Care Activities: Participate in Transition of Care Program/Attend TOC scheduled calls Take all medications as prescribed Attend all scheduled provider appointments Call pharmacy for medication refills 3-7 days in advance of running out of medications Perform all self care activities independently  Perform IADL's (shopping, preparing meals, housekeeping, managing finances) independently Call provider office for new concerns or questions   Follow Up Plan:  Telephone follow up appointment with care management team member scheduled for:  04/09/23 11:00am The patient has been provided with  contact information for the care management team and has been advised to call with any health related questions or concerns.  Follow up with provider re: follow-up appointments , Home Health and DME, visit today           Plan:  Routine follow-up and on-going assessment evaluation and education of disease processes, recommended interventions for both chronic and acute medical conditions , will occur during each weekly visit along with ongoing review of symptoms ,medication reviews and reconciliation. Any updates , inconsistencies, discrepancies or acute care concerns will be addressed and routed to the correct Practitioner if indicated   Based on current information and Insurance plan -Reviewed benefits available to patient, including details about eligibility options for care if any area of needs were identified.  Reviewed patients ability to access and / or navigating the benefits system..Amb Referral made if indicted , refer to orders section of note for details   Please refer to Care Plan for goals and interventions -Effectiveness of interventions, symptom management and outcomes will be evaluated  weekly during [facility] 30-day Program Outreach calls  . Any necessary  changes and updates to Care Plan will be completed episodically    Reviewed goals for care Patient verbalizes understanding of instructions and care plan provided. Patient was encouraged to make informed decisions about their care, actively participate in managing their health condition, and implement lifestyle changes as needed to promote independence and self-management of health care    The patient has been provided with contact  information for the care management team and has been advised to call with any health-related questions or concerns.   The patient has been provided with contact information for the care management team and has been advised to call with any health related questions or concerns.   Susa Loffler , BSN,  RN Care Management Coordinator    [facility] [email or web address] Direct Dial: [phone]

## 2023-04-02 NOTE — Patient Instructions (Signed)
Visit Information  Thank you for taking time to visit with me today. Please don't hesitate to contact me if I can be of assistance to you before our next scheduled telephone appointment.  Our next appointment is by telephone on 12/24 at 11:00am   Following is a copy of your care plan:   Goals Addressed             This Visit's Progress    TOC Care Plan       Current Barriers:  Care Coordination needs related to Limited social support, Level of care concerns, ADL IADL limitations, Inability to perform ADL's independently, and Inability to perform IADL's independently Chronic Disease Management support and education needs related to CHF   RNCM Clinical Goal(s):  Patient will work with the Care Management team over the next 30 days to address Transition of Care Barriers: Medication Management Support at home Provider appointments Home Health services take all medications exactly as prescribed and will call provider for medication related questions as evidenced by no missed medications no unmanaged side effects  attend all scheduled medical appointments: with PCP, Cardiology and Home Health  as evidenced by no missed appointments  work with Child psychotherapist to address  related to the management of Limited social support, Transportation, Level of care concerns, and ADL IADL limitations related to the management of CHF as evidenced by review of EMR and patient or Child psychotherapist report through collaboration with Medical illustrator, provider, and care team.   Interventions: Evaluation of current treatment plan related to  self management and patient's adherence to plan as established by provider-Pulmonology f/u 12/13, continue CPAP @ night, O2 3 L/M PRN, enc weight  loss   Transitions of Care:  Goal on track:  Yes. Doctor Visits  - discussed the importance of doctor visits Contacted Health RN/OT/PT Frances Furbish [phone] visit scheduled for today   Post discharge activity limitations  prescribed by provider reviewed  Heart Failure Interventions:  (Status: On Track  ) Short Term Goal Basic overview and discussion of pathophysiology of Heart Failure reviewed Provided education on low sodium diet Assessed need for readable accurate scales in home Provided education about placing scale on hard, flat surface Discussed importance of daily weight and advised patient to weigh and record daily Reviewed role of diuretics in prevention of fluid overload and management of heart failure; Discussed the importance of keeping all appointments with provider Provided patient with education about the role of exercise in the management of heart failure  Patient Goals/Self-Care Activities: Participate in Transition of Care Program/Attend TOC scheduled calls Take all medications as prescribed Attend all scheduled provider appointments Call pharmacy for medication refills 3-7 days in advance of running out of medications Perform all self care activities independently  Perform IADL's (shopping, preparing meals, housekeeping, managing finances) independently Call provider office for new concerns or questions   Follow Up Plan:  Telephone follow up appointment with care management team member scheduled for:  04/09/23 11:00am The patient has been provided with contact information for the care management team and has been advised to call with any health related questions or concerns.  Follow up with provider re: follow-up appointments , Home Health and DME, visit today           Reviewed goals for care Patient/ Caregiver  verbalizes understanding of instructions and care plan provided. Patient / Caregiver was encouraged to make informed decisions about care, actively participate in managing health conditions, and implement lifestyle changes as needed  to promote independence and self-management of health care   [facility] Case Management Nurse will provide follow-up and on-going assessment evaluation  and education of disease processes, recommended interventions for both chronic and acute medical conditions ,  along with ongoing review of symptoms ,medication reviews and reconciliation during each weekly  call .  Any updates , inconsistencies, discrepancies or acute care concerns will be addressed and routed to the correct Practitioner if indicated    Please call the care guide team at [phone] , or call me directly at the number below if you need to cancel or reschedule your appointment. Three attempts will be made to reach patient if the scheduled call is missed. If we are unable to reach the patient after 3 attempts the case will be closed and no additional outreach attempts will be made.   If you are experiencing a medical emergency, please call 911 or report to your local emergency department or urgent care.    If you have a non-emergency medical problem during routine business hours, please contact your provider's office and ask to speak with a nurse.    If you are experiencing a Mental Health or Behavioral Health Crisis or need someone to talk to Please call the Suicide and Crisis Lifeline: 25 You may also call the [place] National Suicide Prevention Lifeline: 6197274716 or TTY: (619)057-2698 TTY (236) 076-1993) to talk to a trained counselor.  Additionally you may call the Behavioral Health Crisis Line at 770-695-3299, at any time, 24 hours a day, 7 days a week- however If you are in danger or need immediate medical attention, call 911.   If you would like help to quit smoking, call 1-800-QUIT-NOW ( 646-481-1220) OR Espaol: 1-855-Djelo-Ya (6-433-295-1884) o para ms informacin haga clic aqu or Text READY to 166-063 to register via text.  Susa Loffler , BSN, RN Care Management Coordinator [place]   [facility] [email or web address] Direct Dial: [phone]

## 2023-04-03 ENCOUNTER — Telehealth: Payer: Self-pay | Admitting: Nurse Practitioner

## 2023-04-03 NOTE — Telephone Encounter (Signed)
Okay for verbal orders. 

## 2023-04-03 NOTE — Telephone Encounter (Unsigned)
Copied from CRM [id number]. Topic: Referral - Request for Referral >> Apr 03, 2023 12:32 PM Priscille Loveless wrote: Reason for CRM: Bonita Quin with [facility] is calling in regards to pt and needing the following Verbal orders/ Nursing, PT, OT and social worker.  Her phone is [phone] and fax is [phone].

## 2023-04-04 ENCOUNTER — Ambulatory Visit (INDEPENDENT_AMBULATORY_CARE_PROVIDER_SITE_OTHER): Payer: 59 | Admitting: Nurse Practitioner

## 2023-04-04 ENCOUNTER — Encounter: Payer: Self-pay | Admitting: Nurse Practitioner

## 2023-04-04 VITALS — BP 117/69 | HR 81 | Temp 97.1°F | Ht 65.0 in | Wt 287.4 lb

## 2023-04-04 DIAGNOSIS — Z09 Encounter for follow-up examination after completed treatment for conditions other than malignant neoplasm: Secondary | ICD-10-CM

## 2023-04-04 DIAGNOSIS — J441 Chronic obstructive pulmonary disease with (acute) exacerbation: Secondary | ICD-10-CM | POA: Diagnosis not present

## 2023-04-04 DIAGNOSIS — J9621 Acute and chronic respiratory failure with hypoxia: Secondary | ICD-10-CM | POA: Diagnosis not present

## 2023-04-04 DIAGNOSIS — I5033 Acute on chronic diastolic (congestive) heart failure: Secondary | ICD-10-CM | POA: Diagnosis not present

## 2023-04-04 MED ORDER — FUROSEMIDE 40 MG PO TABS: 40.0000 mg | ORAL_TABLET | Freq: Every day | ORAL | 1 refills | Status: DC

## 2023-04-04 MED ORDER — METOPROLOL SUCCINATE ER 25 MG PO TB24: 12.5000 mg | ORAL_TABLET | Freq: Every day | ORAL | 1 refills | Status: DC

## 2023-04-04 MED ORDER — EMPAGLIFLOZIN 10 MG PO TABS: 10.0000 mg | ORAL_TABLET | Freq: Every day | ORAL | 1 refills | Status: DC

## 2023-04-04 MED ORDER — ALBUTEROL SULFATE HFA 108 (90 BASE) MCG/ACT IN AERS: 1.0000 | INHALATION_SPRAY | Freq: Four times a day (QID) | RESPIRATORY_TRACT | 1 refills | Status: DC | PRN

## 2023-04-04 NOTE — Assessment & Plan Note (Signed)
Improving.  Saw Pulmonology on 12/13. Symptoms continue to resolve.  Only using 3L with activity not using O2 at home.  Encouraged to get an Pulse Ox at home to monitor levels.  Continue to follow up with Pulmonology.

## 2023-04-04 NOTE — Telephone Encounter (Signed)
Left detailed message for Bonita Quin from [facility] providing verbal OK orders per Larae Grooms, NP. Advised to call back if she has any questions regarding voice message.

## 2023-04-04 NOTE — Progress Notes (Signed)
BP 117/69 (BP Location: Left Arm, Patient Position: Sitting, Cuff Size: Large)   Pulse 81   Temp (!) 97.1 F (36.2 C) (Oral)   Ht 5\' 5"  (1.651 m)   Wt 287 lb 6.4 oz (130.4 kg)   SpO2 95%   BMI 47.83 kg/m    Subjective:    Patient ID: Michelle Jenkins, female    DOB: [birth year], 77 y.o.   MRN: [id number]  HPI: Michelle Jenkins is a 77 y.o. female  Chief Complaint  Patient presents with   Hospitalization Follow-up    No concerns today   Transition of Care Hospital Follow up.   Hospital/Facility: [facility] D/C Physician: Dr. Lucianne Muss D/C Date: 03/26/23  Records Requested: NA Records Received: yes Records Reviewed: Yes  Diagnoses on Discharge:   # Acute on chronic diastolic CHF (congestive heart failure)  Cardiomegaly versus pericardial effusion on chest x-ray Weight down to 287 lbs in office today.  This is down from 302 lb on 03/24/23  She is weighing at home.  Today she was 284lb.  Has follow up with Cardiology scheduled today.   Continue Lasix 40 mg p.o. daily, continue Toprol-XL 12.5 mg p.o. daily, olmesartan 40 mg p.o. daily.  Jardiance 10 mg p.o. daily- refills provided today.   # Acute on chronic respiratory failure with hypoxia Chest xray ordered to ensure resolution of LLL consolidation.   On 2L at rest and 3L with activity. Continue with Lasix 20mg  BID and Potassium 10 Meq   # COPD with acute exacerbation Improved- LLL consolidation resolved.  Xray done by Pulmonology on 12/13.  # Anemia of chronic disease.    # Atrial fibrillation, CVR,   # Hypertension: Continue telmisartan.  Started Toprol-XL  # CAD in native artery and History of CVA: Continue atorvastatin, beta-blocker, Eliquis  # Primary pulmonary hypertension: Continue Ambrisentan  # Obstructive sleep apnea: Continue CPAP nightly. Patient follows with pulmonology last seen in May 2024 # Morbid obesity   Denies HA, CP, SOB, dizziness, palpitations, visual changes, and lower extremity  swelling.   Date of interactive Contact within 48 hours of discharge:  Contact was through: phone  Date of 7 day or 14 day face-to-face visit:    within 7 days  Outpatient Encounter Medications as of 04/04/2023  Medication Sig   ambrisentan (LETAIRIS) 10 MG tablet Take 10 mg by mouth daily.   apixaban (ELIQUIS) 5 MG TABS tablet Take 1 tablet (5 mg total) by mouth 2 (two) times daily.   atorvastatin (LIPITOR) 20 MG tablet Take 1 tablet daily   ezetimibe (ZETIA) 10 MG tablet Take by mouth daily   gabapentin (NEURONTIN) 300 MG capsule Take 2 capsules at bedtime.   olmesartan (BENICAR) 20 MG tablet Take 1 tablet (20 mg total) by mouth daily.   pantoprazole (PROTONIX) 40 MG tablet TAKE 1 TABLET BY MOUTH EVERY DAY   Psyllium (METAMUCIL FIBER PO) Take 1 Dose by mouth as needed. Patient uses Metamucil Powder in the AM   [DISCONTINUED] empagliflozin (JARDIANCE) 10 MG TABS tablet Take 1 tablet (10 mg total) by mouth daily.   [DISCONTINUED] furosemide (LASIX) 40 MG tablet Take 1 tablet (40 mg total) by mouth daily.   [DISCONTINUED] metoprolol succinate (TOPROL-XL) 25 MG 24 hr tablet Take 0.5 tablets (12.5 mg total) by mouth daily.   [DISCONTINUED] VENTOLIN HFA 108 (90 Base) MCG/ACT inhaler as needed.   albuterol (VENTOLIN HFA) 108 (90 Base) MCG/ACT inhaler Inhale 1-2 puffs into the lungs every 6 (six) hours as needed.  empagliflozin (JARDIANCE) 10 MG TABS tablet Take 1 tablet (10 mg total) by mouth daily.   furosemide (LASIX) 40 MG tablet Take 1 tablet (40 mg total) by mouth daily.   metoprolol succinate (TOPROL-XL) 25 MG 24 hr tablet Take 0.5 tablets (12.5 mg total) by mouth daily.   No facility-administered encounter medications on file as of 04/04/2023.    Diagnostic Tests Reviewed/Disposition: Reviewed  Consults: Pulmonology and Cardiology  Discharge Instructions: Reviewed  Disease/illness Education: Provided  Home Health/Community Services Discussions/Referrals: receiving home health  care  Establishment or re-establishment of referral orders for community resources: NA  Discussion with other health care providers: None  Assessment and Support of treatment regimen adherence: Provided  Appointments Coordinated with: Patient  Education for self-management, independent living, and ADLs: Discussed at visit today     Relevant past medical, surgical, family and social history reviewed and updated as indicated. Interim medical history since our last visit reviewed. Allergies and medications reviewed and updated.  Review of Systems  Eyes:  Negative for visual disturbance.  Respiratory:  Positive for shortness of breath. Negative for cough and chest tightness.   Cardiovascular:  Negative for chest pain, palpitations and leg swelling.  Neurological:  Negative for dizziness and headaches.    Per HPI unless specifically indicated above     Objective:    BP 117/69 (BP Location: Left Arm, Patient Position: Sitting, Cuff Size: Large)   Pulse 81   Temp (!) 97.1 F (36.2 C) (Oral)   Ht 5\' 5"  (1.651 m)   Wt 287 lb 6.4 oz (130.4 kg)   SpO2 95%   BMI 47.83 kg/m   Wt Readings from Last 3 Encounters:  04/04/23 287 lb 6.4 oz (130.4 kg)  03/24/23 (!) 302 lb 0.5 oz (137 kg)  03/04/23 283 lb 12.8 oz (128.7 kg)    Physical Exam Vitals and nursing note reviewed.  Constitutional:      General: She is not in acute distress.    Appearance: Normal appearance. She is obese. She is not ill-appearing, toxic-appearing or diaphoretic.  HENT:     Head: Normocephalic.     Right Ear: External ear normal.     Left Ear: External ear normal.     Nose: Nose normal.     Mouth/Throat:     Mouth: Mucous membranes are moist.     Pharynx: Oropharynx is clear.  Eyes:     General:        Right eye: No discharge.        Left eye: No discharge.     Extraocular Movements: Extraocular movements intact.     Conjunctiva/sclera: Conjunctivae normal.     Pupils: Pupils are equal, round, and  reactive to light.  Cardiovascular:     Rate and Rhythm: Normal rate. Rhythm irregular.     Heart sounds: No murmur heard. Pulmonary:     Effort: Pulmonary effort is normal. No respiratory distress.     Breath sounds: Normal breath sounds. No wheezing or rales.  Musculoskeletal:     Cervical back: Normal range of motion and neck supple.  Skin:    General: Skin is warm and dry.     Capillary Refill: Capillary refill takes less than 2 seconds.  Neurological:     General: No focal deficit present.     Mental Status: She is alert and oriented to person, place, and time. Mental status is at baseline.  Psychiatric:        Mood and Affect: Mood normal.  Behavior: Behavior normal.        Thought Content: Thought content normal.        Judgment: Judgment normal.     Results for orders placed or performed during the hospital encounter of 03/22/23  Resp panel by RT-PCR (RSV, Flu A&B, Covid) Anterior Nasal Swab   Collection Time: 03/22/23 12:30 PM   Specimen: Anterior Nasal Swab  Result Value Ref Range   SARS Coronavirus 2 by RT PCR NEGATIVE NEGATIVE   Influenza A by PCR NEGATIVE NEGATIVE   Influenza B by PCR NEGATIVE NEGATIVE   Resp Syncytial Virus by PCR NEGATIVE NEGATIVE  Brain natriuretic peptide   Collection Time: 03/22/23 12:30 PM  Result Value Ref Range   B Natriuretic Peptide 474.5 (H) 0.0 - 100.0 pg/mL  Basic metabolic panel   Collection Time: 03/22/23 12:30 PM  Result Value Ref Range   Sodium 144 135 - 145 mmol/L   Potassium 3.8 3.5 - 5.1 mmol/L   Chloride 102 98 - 111 mmol/L   CO2 33 (H) 22 - 32 mmol/L   Glucose, Bld 105 (H) 70 - 99 mg/dL   BUN 14 8 - 23 mg/dL   Creatinine, Ser 1.61 (H) 0.44 - 1.00 mg/dL   Calcium 8.9 8.9 - 09.6 mg/dL   GFR, Estimated 53 (L) >60 mL/min   Anion gap 9 5 - 15  CBC   Collection Time: 03/22/23 12:30 PM  Result Value Ref Range   WBC 4.7 4.0 - 10.5 K/uL   RBC 2.97 (L) 3.87 - 5.11 MIL/uL   Hemoglobin 9.5 (L) 12.0 - 15.0 g/dL   HCT  04.5 (L) 40.9 - 46.0 %   MCV 101.0 (H) 80.0 - 100.0 fL   MCH 32.0 26.0 - 34.0 pg   MCHC 31.7 30.0 - 36.0 g/dL   RDW 81.1 91.4 - 78.2 %   Platelets 150 150 - 400 K/uL   nRBC 0.0 0.0 - 0.2 %  Troponin I (High Sensitivity)   Collection Time: 03/22/23 12:30 PM  Result Value Ref Range   Troponin I (High Sensitivity) 12 <18 ng/L  Troponin I (High Sensitivity)   Collection Time: 03/22/23  4:32 PM  Result Value Ref Range   Troponin I (High Sensitivity) 12 <18 ng/L  Vitamin B12   Collection Time: 03/22/23  9:17 PM  Result Value Ref Range   Vitamin B-12 407 180 - 914 pg/mL  Folate   Collection Time: 03/22/23  9:17 PM  Result Value Ref Range   Folate 15.4 >5.9 ng/mL  Iron and TIBC   Collection Time: 03/22/23  9:17 PM  Result Value Ref Range   Iron 43 28 - 170 ug/dL   TIBC 956 213 - 086 ug/dL   Saturation Ratios 15 10.4 - 31.8 %   UIBC 254 ug/dL  Ferritin   Collection Time: 03/22/23  9:17 PM  Result Value Ref Range   Ferritin 74 11 - 307 ng/mL  Reticulocytes   Collection Time: 03/22/23  9:17 PM  Result Value Ref Range   Retic Ct Pct 2.1 0.4 - 3.1 %   RBC. 3.26 (L) 3.87 - 5.11 MIL/uL   Retic Count, Absolute 67.5 19.0 - 186.0 K/uL   Immature Retic Fract 25.1 (H) 2.3 - 15.9 %  Hepatic function panel   Collection Time: 03/22/23  9:17 PM  Result Value Ref Range   Total Protein 7.3 6.5 - 8.1 g/dL   Albumin 4.1 3.5 - 5.0 g/dL   AST 29 15 - 41 U/L  ALT 31 0 - 44 U/L   Alkaline Phosphatase 59 38 - 126 U/L   Total Bilirubin 1.0 <1.2 mg/dL   Bilirubin, Direct 0.2 0.0 - 0.2 mg/dL   Indirect Bilirubin 0.8 0.3 - 0.9 mg/dL  CBC   Collection Time: 03/23/23  5:27 AM  Result Value Ref Range   WBC 5.1 4.0 - 10.5 K/uL   RBC 3.13 (L) 3.87 - 5.11 MIL/uL   Hemoglobin 10.1 (L) 12.0 - 15.0 g/dL   HCT 16.1 (L) 09.6 - 04.5 %   MCV 99.0 80.0 - 100.0 fL   MCH 32.3 26.0 - 34.0 pg   MCHC 32.6 30.0 - 36.0 g/dL   RDW 40.9 81.1 - 91.4 %   Platelets 164 150 - 400 K/uL   nRBC 0.0 0.0 - 0.2 %  Basic  metabolic panel   Collection Time: 03/23/23  5:27 AM  Result Value Ref Range   Sodium 142 135 - 145 mmol/L   Potassium 3.6 3.5 - 5.1 mmol/L   Chloride 98 98 - 111 mmol/L   CO2 32 22 - 32 mmol/L   Glucose, Bld 141 (H) 70 - 99 mg/dL   BUN 16 8 - 23 mg/dL   Creatinine, Ser 7.82 0.44 - 1.00 mg/dL   Calcium 8.7 (L) 8.9 - 10.3 mg/dL   GFR, Estimated 59 (L) >60 mL/min   Anion gap 12 5 - 15  Magnesium   Collection Time: 03/23/23  5:27 AM  Result Value Ref Range   Magnesium 2.1 1.7 - 2.4 mg/dL  Phosphorus   Collection Time: 03/23/23  5:27 AM  Result Value Ref Range   Phosphorus 4.0 2.5 - 4.6 mg/dL  ECHOCARDIOGRAM COMPLETE   Collection Time: 03/23/23  8:43 PM  Result Value Ref Range   Weight 4,934.78 oz   Height 65 in   BP 142/72 mmHg   Ao pk vel 2.31 m/s   AV Area VTI 1.90 cm2   AR max vel 1.79 cm2   AV Mean grad 10.5 mmHg   AV Peak grad 21.3 mmHg   S' Lateral 3.50 cm   AV Area mean vel 2.04 cm2   Area-P 1/2 4.46 cm2   P 1/2 time 418 msec   MV VTI 1.89 cm2   Est EF 60 - 65%   Basic metabolic panel   Collection Time: 03/24/23  6:36 AM  Result Value Ref Range   Sodium 141 135 - 145 mmol/L   Potassium 3.7 3.5 - 5.1 mmol/L   Chloride 96 (L) 98 - 111 mmol/L   CO2 34 (H) 22 - 32 mmol/L   Glucose, Bld 127 (H) 70 - 99 mg/dL   BUN 22 8 - 23 mg/dL   Creatinine, Ser 9.56 (H) 0.44 - 1.00 mg/dL   Calcium 8.9 8.9 - 21.3 mg/dL   GFR, Estimated 55 (L) >60 mL/min   Anion gap 11 5 - 15  CBC   Collection Time: 03/24/23  6:36 AM  Result Value Ref Range   WBC 8.4 4.0 - 10.5 K/uL   RBC 3.05 (L) 3.87 - 5.11 MIL/uL   Hemoglobin 10.0 (L) 12.0 - 15.0 g/dL   HCT 08.6 (L) 57.8 - 46.9 %   MCV 96.4 80.0 - 100.0 fL   MCH 32.8 26.0 - 34.0 pg   MCHC 34.0 30.0 - 36.0 g/dL   RDW 62.9 52.8 - 41.3 %   Platelets 183 150 - 400 K/uL   nRBC 0.0 0.0 - 0.2 %  Magnesium   Collection Time:  03/24/23  6:36 AM  Result Value Ref Range   Magnesium 2.3 1.7 - 2.4 mg/dL  Phosphorus   Collection Time: 03/24/23   6:36 AM  Result Value Ref Range   Phosphorus 3.9 2.5 - 4.6 mg/dL  Basic metabolic panel   Collection Time: 03/25/23  6:13 AM  Result Value Ref Range   Sodium 141 135 - 145 mmol/L   Potassium 3.3 (L) 3.5 - 5.1 mmol/L   Chloride 97 (L) 98 - 111 mmol/L   CO2 31 22 - 32 mmol/L   Glucose, Bld 100 (H) 70 - 99 mg/dL   BUN 24 (H) 8 - 23 mg/dL   Creatinine, Ser 1.61 (H) 0.44 - 1.00 mg/dL   Calcium 8.5 (L) 8.9 - 10.3 mg/dL   GFR, Estimated 55 (L) >60 mL/min   Anion gap 13 5 - 15  CBC   Collection Time: 03/25/23  6:13 AM  Result Value Ref Range   WBC 6.8 4.0 - 10.5 K/uL   RBC 3.12 (L) 3.87 - 5.11 MIL/uL   Hemoglobin 9.9 (L) 12.0 - 15.0 g/dL   HCT 09.6 (L) 04.5 - 40.9 %   MCV 97.8 80.0 - 100.0 fL   MCH 31.7 26.0 - 34.0 pg   MCHC 32.5 30.0 - 36.0 g/dL   RDW 81.1 91.4 - 78.2 %   Platelets 170 150 - 400 K/uL   nRBC 0.0 0.0 - 0.2 %  Magnesium   Collection Time: 03/25/23  6:13 AM  Result Value Ref Range   Magnesium 2.2 1.7 - 2.4 mg/dL  Phosphorus   Collection Time: 03/25/23  6:13 AM  Result Value Ref Range   Phosphorus 4.1 2.5 - 4.6 mg/dL  Basic metabolic panel   Collection Time: 03/26/23  4:15 AM  Result Value Ref Range   Sodium 141 135 - 145 mmol/L   Potassium 3.6 3.5 - 5.1 mmol/L   Chloride 93 (L) 98 - 111 mmol/L   CO2 37 (H) 22 - 32 mmol/L   Glucose, Bld 122 (H) 70 - 99 mg/dL   BUN 28 (H) 8 - 23 mg/dL   Creatinine, Ser 9.56 (H) 0.44 - 1.00 mg/dL   Calcium 8.6 (L) 8.9 - 10.3 mg/dL   GFR, Estimated 54 (L) >60 mL/min   Anion gap 11 5 - 15  CBC   Collection Time: 03/26/23  4:15 AM  Result Value Ref Range   WBC 7.6 4.0 - 10.5 K/uL   RBC 3.35 (L) 3.87 - 5.11 MIL/uL   Hemoglobin 10.7 (L) 12.0 - 15.0 g/dL   HCT 21.3 (L) 08.6 - 57.8 %   MCV 96.7 80.0 - 100.0 fL   MCH 31.9 26.0 - 34.0 pg   MCHC 33.0 30.0 - 36.0 g/dL   RDW 46.9 62.9 - 52.8 %   Platelets 187 150 - 400 K/uL   nRBC 0.0 0.0 - 0.2 %  Magnesium   Collection Time: 03/26/23  4:15 AM  Result Value Ref Range    Magnesium 2.3 1.7 - 2.4 mg/dL  Phosphorus   Collection Time: 03/26/23  4:15 AM  Result Value Ref Range   Phosphorus 3.9 2.5 - 4.6 mg/dL      Assessment & Plan:   Problem List Items Addressed This Visit       Cardiovascular and Mediastinum   Acute on chronic diastolic CHF (congestive heart failure) (HCC)   Chronic. Improving symptoms.  Weight is down almost 20lbs since hospitalization.  Continue with Lasix 40mg  daily.  CMP checked at visit  today.  Refilled medications.  Continue with Jardiance.  Has an appt with Cardiology today.  - Reminded to call for an overnight weight gain of >2 pounds or a weekly weight gain of >5 pounds - not adding salt to food and read food labels. Reviewed the importance of keeping daily sodium intake to 2000mg  daily. - Avoid Ibuprofen products.       Relevant Medications   furosemide (LASIX) 40 MG tablet   metoprolol succinate (TOPROL-XL) 25 MG 24 hr tablet     Respiratory   COPD with acute exacerbation (HCC)   Improving.  Saw Pulmonology on 12/13. Symptoms continue to resolve.  Only using 3L with activity not using O2 at home.  Encouraged to get an Pulse Ox at home to monitor levels.  Continue to follow up with Pulmonology.       Relevant Medications   albuterol (VENTOLIN HFA) 108 (90 Base) MCG/ACT inhaler   Acute on chronic respiratory failure with hypoxia (HCC)   Improving.  Saw Pulmonology on 12/13. Symptoms continue to resolve.  Only using 3L with activity not using O2 at home.  Encouraged to get an Pulse Ox at home to monitor levels.  Continue to follow up with Pulmonology.       Other Visit Diagnoses       Hospital discharge follow-up    -  Primary   Symptoms are resolving. Chest xray repeated by Pulm with no evidence of LLL consulidation. Weight is down almost 20lbs since hospitalization. CMP checked today.   Relevant Orders   Comp Met (CMET)        Follow up plan: Return in about 1 month (around 05/05/2023) for HTN, HLD, DM2  FU.

## 2023-04-04 NOTE — Assessment & Plan Note (Signed)
Chronic. Improving symptoms.  Weight is down almost 20lbs since hospitalization.  Continue with Lasix 40mg  daily.  CMP checked at visit today.  Refilled medications.  Continue with Jardiance.  Has an appt with Cardiology today.  - Reminded to call for an overnight weight gain of >2 pounds or a weekly weight gain of >5 pounds - not adding salt to food and read food labels. Reviewed the importance of keeping daily sodium intake to 2000mg  daily. - Avoid Ibuprofen products.

## 2023-04-05 ENCOUNTER — Encounter: Payer: Self-pay | Admitting: Nurse Practitioner

## 2023-04-05 LAB — COMPREHENSIVE METABOLIC PANEL
ALT: 13 [IU]/L (ref 0–32)
AST: 19 [IU]/L (ref 0–40)
Albumin: 4.4 g/dL (ref 3.8–4.8)
Alkaline Phosphatase: 76 [IU]/L (ref 44–121)
BUN/Creatinine Ratio: 12 (ref 12–28)
BUN: 14 mg/dL (ref 8–27)
Bilirubin Total: 0.6 mg/dL (ref 0.0–1.2)
CO2: 25 mmol/L (ref 20–29)
Calcium: 9.2 mg/dL (ref 8.7–10.3)
Chloride: 105 mmol/L (ref 96–106)
Creatinine, Ser: 1.13 mg/dL — ABNORMAL HIGH (ref 0.57–1.00)
Globulin, Total: 2.5 g/dL (ref 1.5–4.5)
Glucose: 96 mg/dL (ref 70–99)
Potassium: 4 mmol/L (ref 3.5–5.2)
Sodium: 145 mmol/L — ABNORMAL HIGH (ref 134–144)
Total Protein: 6.9 g/dL (ref 6.0–8.5)
eGFR: 49 mL/min/{1.73_m2} — ABNORMAL LOW (ref >59)

## 2023-04-05 MED ORDER — METOPROLOL SUCCINATE ER 25 MG PO TB24: 25.0000 mg | ORAL_TABLET | Freq: Every day | ORAL | Status: DC

## 2023-04-09 ENCOUNTER — Other Ambulatory Visit: Payer: Self-pay

## 2023-04-09 NOTE — Patient Outreach (Signed)
  Care Management  Transitions of Care Program Transitions of Care Post-discharge week 2  04/09/2023 Name: Michelle Jenkins MRN: [id number] DOB: Aug 29, 1945  Subjective: Michelle Jenkins is a 77 y.o. year old female who is a primary care patient of Larae Grooms, NP. The Care Management team spoke with patient by telephone to assess and address transitions of care needs. OT was currently in home seeing patient  Attempted to reschedule for later today, she stated she has an aide coming to help her get shower and ready for holiday. Will call her next week.04/15/23 11am  She stated she was doing fine had no current questions or concerns   Plan: Additional outreach attempts will be made to reach the patient enrolled in the [facility] Program (Post Inpatient/ED Visit).  Susa Loffler , BSN, RN Care Management Coordinator [place]   [facility] [email or web address] Direct Dial: [phone]

## 2023-04-15 ENCOUNTER — Other Ambulatory Visit: Payer: Self-pay | Admitting: Nurse Practitioner

## 2023-04-15 ENCOUNTER — Other Ambulatory Visit: Payer: Self-pay | Admitting: Internal Medicine

## 2023-04-15 ENCOUNTER — Other Ambulatory Visit: Payer: Self-pay

## 2023-04-15 ENCOUNTER — Encounter: Payer: Self-pay | Admitting: Nurse Practitioner

## 2023-04-15 DIAGNOSIS — Z1231 Encounter for screening mammogram for malignant neoplasm of breast: Secondary | ICD-10-CM

## 2023-04-15 DIAGNOSIS — I509 Heart failure, unspecified: Secondary | ICD-10-CM

## 2023-04-15 DIAGNOSIS — Z8679 Personal history of other diseases of the circulatory system: Secondary | ICD-10-CM

## 2023-04-15 DIAGNOSIS — R0602 Shortness of breath: Secondary | ICD-10-CM

## 2023-04-15 NOTE — Patient Instructions (Signed)
Visit Information  Thank you for taking time to visit with me today. Please don't hesitate to contact me if I can be of assistance to you before our next scheduled telephone appointment.  Our next appointment is by telephone on 04/23/23 at 11:30am  Following is a copy of your care plan:   Goals Addressed             This Visit's Progress    TOC Care Plan       Current Barriers:  Care Coordination needs related to Limited social support, Level of care concerns, ADL IADL limitations, Inability to perform ADL's independently, and Inability to perform IADL's independently Chronic Disease Management support and education needs related to CHF   RNCM Clinical Goal(s):  Patient will work with the Care Management team over the next 30 days to address Transition of Care Barriers: Medication Management Support at home Provider appointments Home Health services take all medications exactly as prescribed and will call provider for medication related questions as evidenced by no missed medications no unmanaged side effects  attend all scheduled medical appointments: with PCP, Cardiology and Home Health  as evidenced by no missed appointments  work with Child psychotherapist to address  related to the management of Limited social support, Transportation, Level of care concerns, and ADL IADL limitations related to the management of CHF as evidenced by review of EMR and patient or Child psychotherapist report through collaboration with Medical illustrator, provider, and care team.   Interventions: Evaluation of current treatment plan related to  self management and patient's adherence to plan as established by provider-Pulmonology f/u 12/13, continue CPAP @ night, O2 3 L/M PRN, enc weight  loss   Transitions of Care:  Goal on track:  Yes. Doctor Visits  - discussed the importance of doctor visits Contacted Health RN/OT/PT Frances Furbish [phone] visit scheduled for today   Post discharge activity limitations  prescribed by provider reviewed  Heart Failure Interventions:  (Status: On Track  ) Short Term Goal Basic overview and discussion of pathophysiology of Heart Failure reviewed Provided education on low sodium diet Assessed need for readable accurate scales in home Provided education about placing scale on hard, flat surface Discussed importance of daily weight and advised patient to weigh and record daily Reviewed role of diuretics in prevention of fluid overload and management of heart failure; Discussed the importance of keeping all appointments with provider Provided patient with education about the role of exercise in the management of heart failure  Patient Goals/Self-Care Activities: Participate in Transition of Care Program/Attend TOC scheduled calls Take all medications as prescribed Attend all scheduled provider appointments Call pharmacy for medication refills 3-7 days in advance of running out of medications Perform all self care activities independently  Perform IADL's (shopping, preparing meals, housekeeping, managing finances) independently Call provider office for new concerns or questions   Follow Up Plan:  Telephone follow up appointment with care management team member scheduled for:  1/ 7/25 11:30am The patient has been provided with contact information for the care management team and has been advised to call with any health related questions or concerns.  Follow up with provider re: follow-up appointments , Home Health and DME, visit today - doing exercises           Reviewed goals for care Patient/ Caregiver  verbalizes understanding of instructions and care plan provided. Patient / Caregiver was encouraged to make informed decisions about care, actively participate in managing health conditions, and implement lifestyle  changes as needed to promote independence and self-management of their medical care   VBCI Case Management Nurse will provide follow-up and  on-going assessment ,evaluation and education of disease processes, recommended interventions for both chronic and acute medical conditions ,  along with ongoing review of symptoms ,medication reviews / reconciliation during each weekly call . Any updates , inconsistencies, discrepancies or acute care concerns will be addressed and routed to the correct Practitioner if indicated    Please call the care guide team at [phone]  if you need to cancel or reschedule your appointment . For scheduled calls -Three attempts will be made to reach you if the scheduled call is missed or  we are unable to reach the you  after 3 attempts the case will be closed and no additional outreach attempts will be made.   If you need to speak to a Nurse you may  call me directly at the number below or if I am unavailable,and  your need is urgent  please call the main VBCI number at [phone] and ask to speak with one of the [facility] ( Transition of Care )  Nurses  .     Additionally, If you experience worsening of your symptoms, develop shortness of breath, If you are experiencing a medical emergency,  develop suicidal or homicidal thoughts you must seek medical attention immediately by calling 911 or report to your local emergency department or urgent care.   If you have a non-emergency medical problem during routine business hours, please contact your provider's office and ask to speak with a nurse.       Please take the time to read instructions/literature along with the possible adverse reactions/side effects for all the Medicines that have been prescribed to you. Only take newly prescribed  Medications after you have completely understood and accept all the possible adverse reactions/side effects.   Do not take more than prescribed Medications for  Pain, Sleep and Anxiety. Do not drive when taking Pain medications or sleep aid/ insomnia  medications It is not advisable to combine anxiety, sleep and pain medications  without talking with your primary care practitioner    If you are experiencing a Mental Health or Behavioral Health Crisis or need someone to talk to Please call the Suicide and Crisis Lifeline: 988 You may also call the [place] National Suicide Prevention Lifeline: 216-841-3291 or TTY: 7857832547 TTY 347-010-9052) to talk to a trained counselor.  You may call the Behavioral Health Crisis Line at 670 631 5876, at any time, 24 hours a day, 7 days a week- however If you are in danger or need immediate medical attention, call 911.   If you would like help to quit smoking, call 1-800-QUIT-NOW ( 289 502 5885) OR Espaol: 1-855-Djelo-Ya (0-932-355-7322) o para ms informacin haga clic aqu or Text READY to 025-427 to register via text.   Susa Loffler , BSN, RN Care Management Coordinator [place]   [facility] [email or web address] Direct Dial: [phone]

## 2023-04-15 NOTE — Patient Outreach (Signed)
Care Management  Transitions of Care Program Transitions of Care Post-discharge week 3   04/15/2023 Name: Michelle Jenkins MRN: [id number] DOB: October 03, 1945  Subjective: Michelle Jenkins is a 77 y.o. year old female who is a primary care patient of Larae Grooms, NP. The Care Management team Engaged with patient Engaged with patient by telephone to assess and address transitions of care needs.   Consent to Services:  Patient was given information about care management services, agreed to services, and gave verbal consent to participate.   Assessment:   Patient voices no new complaints Patient has not developed/ reported any new Medical issues / Dx or acute changes.- since last follow-up call for most recent  Hospital stay 12/6-12/10/ 2024  She is doing well. Continues with therapy, No falls completing exercised as directed. She is expecting a call from Dr Zerita Boers office today regarding follow-up  She denies SOB, DOE, she checks her weights and maintains fluid restrictions as directed . Daughter is very supportive and assists She is [place]  Patient educated on red flag s/s to watch for and was encouraged to report, any changes in baseline or  medication regimen,  changes in health status  /  well-being, safety concerns  or any new unmanaged side effects or symptoms not relieved with interventions  to PCP and / or the  VBCI Case Management team          SDOH Interventions    Flowsheet Row Patient Outreach from 04/15/2023 in [facility]PART[facility] Telephone from 03/29/2023 in [facility] ED to Hosp-Admission (Discharged) from 03/22/2023 in [facility] PCU Clinical Support from 02/22/2021 in [facility] Office Visit from 11/03/2020 in [facility] Office Visit from 01/05/2019 in [facility]  SDOH Interventions        Food Insecurity Interventions --  Intervention Not Indicated -- Intervention Not Indicated -- --  Housing Interventions -- Intervention Not Indicated Intervention Not Indicated Intervention Not Indicated -- --  Transportation Interventions -- Intervention Not Indicated Intervention Not Indicated Intervention Not Indicated -- --  Utilities Interventions -- Intervention Not Indicated -- -- -- --  Depression Interventions/Treatment  -- -- -- -- Medication Patient refuses Treatment  Financial Strain Interventions -- -- Intervention Not Indicated Intervention Not Indicated -- --  Stress Interventions -- -- -- Intervention Not Indicated -- --  Social Connections Interventions Intervention Not Indicated, Other (Comment)  [Watches TV, has support] -- -- Intervention Not Indicated -- --        Goals Addressed             This Visit's Progress    TOC Care Plan       Current Barriers:  Care Coordination needs related to Limited social support, Level of care concerns, ADL IADL limitations, Inability to perform ADL's independently, and Inability to perform IADL's independently Chronic Disease Management support and education needs related to CHF   RNCM Clinical Goal(s):  Patient will work with the Care Management team over the next 30 days to address Transition of Care Barriers: Medication Management Support at home Provider appointments Home Health services take all medications exactly as prescribed and will call provider for medication related questions as evidenced by no missed medications no unmanaged side effects  attend all scheduled medical appointments: with PCP, Cardiology and Home Health  as evidenced by no missed appointments  work with Child psychotherapist to address  related to  the management of Limited social support, Transportation, Level of care concerns, and ADL IADL limitations related to the management of CHF as evidenced by review of EMR and patient or social worker report through collaboration with Medical illustrator,  provider, and care team.   Interventions: Evaluation of current treatment plan related to  self management and patient's adherence to plan as established by provider-Pulmonology f/u 12/13, continue CPAP @ night, O2 3 L/M PRN, enc weight  loss   Transitions of Care:  Goal on track:  Yes. Doctor Visits  - discussed the importance of doctor visits Contacted Health RN/OT/PT Frances Furbish [phone] visit scheduled for today   Post discharge activity limitations prescribed by provider reviewed  Heart Failure Interventions:  (Status: On Track  ) Short Term Goal Basic overview and discussion of pathophysiology of Heart Failure reviewed Provided education on low sodium diet Assessed need for readable accurate scales in home Provided education about placing scale on hard, flat surface Discussed importance of daily weight and advised patient to weigh and record daily Reviewed role of diuretics in prevention of fluid overload and management of heart failure; Discussed the importance of keeping all appointments with provider Provided patient with education about the role of exercise in the management of heart failure  Patient Goals/Self-Care Activities: Participate in Transition of Care Program/Attend TOC scheduled calls Take all medications as prescribed Attend all scheduled provider appointments Call pharmacy for medication refills 3-7 days in advance of running out of medications Perform all self care activities independently  Perform IADL's (shopping, preparing meals, housekeeping, managing finances) independently Call provider office for new concerns or questions   Follow Up Plan:  Telephone follow up appointment with care management team member scheduled for:  1/ 7/25 11:30am The patient has been provided with contact information for the care management team and has been advised to call with any health related questions or concerns.  Follow up with provider re: follow-up appointments ,  Home Health and DME, visit today - doing exercises           Plan:  Routine follow-up and on-going assessment evaluation and education of disease processes, recommended interventions for both chronic and acute medical conditions , will occur during each weekly visit along with ongoing review of symptoms ,medication reviews and reconciliation. Any updates , inconsistencies, discrepancies or acute care concerns will be addressed and routed to the correct Practitioner if indicated   Based on current information and Insurance plan -Reviewed benefits available to patient, including details about eligibility options for care if any area of needs were identified.  Reviewed patients ability to access and / or navigating the benefits system..Amb Referral made if indicted , refer to orders section of note for details   Please refer to Care Plan for goals and interventions -Effectiveness of interventions, symptom management and outcomes will be evaluated  weekly during [facility] 30-day Program Outreach calls  . Any necessary  changes and updates to Care Plan will be completed episodically    Reviewed goals for care Patient verbalizes understanding of instructions and care plan provided. Patient was encouraged to make informed decisions about their care, actively participate in managing their health condition, and implement lifestyle changes as needed to promote independence and self-management of health care    The patient has been provided with contact information for the care management team and has been advised to call with any health-related questions or concerns.    Face to Face appointment with care management team member scheduled  for: 04/23/23 @ 11:30am   Susa Loffler , BSN, RN Care Management Coordinator [place]   [facility] [email or web address] Direct Dial: [phone]

## 2023-04-18 DIAGNOSIS — Z7901 Long term (current) use of anticoagulants: Secondary | ICD-10-CM

## 2023-04-18 DIAGNOSIS — I2510 Atherosclerotic heart disease of native coronary artery without angina pectoris: Secondary | ICD-10-CM

## 2023-04-18 DIAGNOSIS — J441 Chronic obstructive pulmonary disease with (acute) exacerbation: Secondary | ICD-10-CM | POA: Diagnosis not present

## 2023-04-18 DIAGNOSIS — Z6841 Body Mass Index (BMI) 40.0 and over, adult: Secondary | ICD-10-CM

## 2023-04-18 DIAGNOSIS — I4891 Unspecified atrial fibrillation: Secondary | ICD-10-CM

## 2023-04-18 DIAGNOSIS — I5033 Acute on chronic diastolic (congestive) heart failure: Secondary | ICD-10-CM | POA: Diagnosis not present

## 2023-04-18 DIAGNOSIS — J9621 Acute and chronic respiratory failure with hypoxia: Secondary | ICD-10-CM | POA: Diagnosis not present

## 2023-04-18 DIAGNOSIS — G4733 Obstructive sleep apnea (adult) (pediatric): Secondary | ICD-10-CM

## 2023-04-18 DIAGNOSIS — I110 Hypertensive heart disease with heart failure: Secondary | ICD-10-CM | POA: Diagnosis not present

## 2023-04-18 DIAGNOSIS — D649 Anemia, unspecified: Secondary | ICD-10-CM

## 2023-04-23 ENCOUNTER — Other Ambulatory Visit: Payer: Self-pay

## 2023-04-23 ENCOUNTER — Inpatient Hospital Stay: Payer: 59 | Admitting: Nurse Practitioner

## 2023-04-23 NOTE — Patient Outreach (Signed)
  Care Management  Transitions of Care Program Transitions of Care Post-discharge week 3  04/23/2023 Name: Michelle Jenkins MRN: [id number] DOB: [birth year]  Subjective: Michelle Jenkins is a 78 y.o. year old female who is a primary care patient of Melvin Pao, NP. The Care Management team was unable to reach the patient by phone to assess and address transitions of care needs.   Plan: Additional outreach attempts will be made to reach the patient enrolled in the [facility] Program (Post Inpatient/ED Visit).  Bari Mayans , BSN, RN Care Management Coordinator [place]   [facility] [email or web address] Direct Dial: [phone]

## 2023-04-24 ENCOUNTER — Telehealth: Payer: Self-pay

## 2023-04-24 NOTE — Patient Instructions (Signed)
 Visit Information  Thank you for taking time to visit with me today. Please don't hesitate to contact me if I can be of assistance to you before our next scheduled telephone appointment.  Our next appointment is by telephone on 05/02/23 at 10:00am   Following is a copy of your care plan:   Goals Addressed             This Visit's Progress    TOC Care Plan       Current Barriers:  Care Coordination needs related to Limited social support, Level of care concerns, ADL IADL limitations, Inability to perform ADL's independently, and Inability to perform IADL's independently Chronic Disease Management support and education needs related to CHF   RNCM Clinical Goal(s):  Patient will work with the Care Management team over the next 30 days to address Transition of Care Barriers: Medication Management Support at home Provider appointments Home Health services take all medications exactly as prescribed and will call provider for medication related questions as evidenced by no missed medications no unmanaged side effects  attend all scheduled medical appointments: with PCP, Cardiology and Home Health  as evidenced by no missed appointments  work with child psychotherapist to address  related to the management of Limited social support, Transportation, Level of care concerns, and ADL IADL limitations related to the management of CHF as evidenced by review of EMR and patient or child psychotherapist report through collaboration with Medical Illustrator, provider, and care team.   Interventions: Evaluation of current treatment plan related to  self management and patient's adherence to plan as established by provider-Pulmonology f/u 12/13, continue CPAP @ night, O2 3 L/M PRN, enc weight  loss   Transitions of Care:  Goal on track:  Yes. Doctor Visits  - discussed the importance of doctor visits Contacted Health RN/OT/PT [facility] [phone] visits scheduled completing exercises    Post discharge activity  limitations prescribed by provider reviewed  Heart Failure Interventions:  (Status: On Track  ) Short Term Goal Basic overview and discussion of pathophysiology of Heart Failure reviewed Provided education on low sodium diet Assessed need for readable accurate scales in home Provided education about placing scale on hard, flat surface Discussed importance of daily weight and advised patient to weigh and record daily Reviewed role of diuretics in prevention of fluid overload and management of heart failure; Discussed the importance of keeping all appointments with provider Provided patient with education about the role of exercise in the management of heart failure  Patient Goals/Self-Care Activities: Participate in Transition of Care Program/Attend TOC scheduled calls Take all medications as prescribed Attend all scheduled provider appointments Call pharmacy for medication refills 3-7 days in advance of running out of medications Perform all self care activities independently  Perform IADL's (shopping, preparing meals, housekeeping, managing finances) independently Call provider office for new concerns or questions   Follow Up Plan:  Telephone follow up appointment with care management team member scheduled for:  1/ 16/25 10:00 am The patient has been provided with contact information for the care management team and has been advised to call with any health related questions or concerns.  Follow up with provider re: follow-up appointments , Home Health and DME, visit today - doing exercises            Reviewed goals for care Patient/ Caregiver verbalizes understanding of instructions with the plan of care . The  Patient / Caregiver was encouraged to make informed decisions about care, actively participate  in managing health conditions, and implement lifestyle changes as needed to promote independence and self-management of healthcare. SDOH screenings have been completed and addressed  if indicted There are no reported barriers to care.    Follow-up Plan VBCI Case Management Nurse will provide follow-up and on-going assessment ,evaluation and education of disease processes, recommended interventions for both chronic and acute medical conditions ,  along with ongoing review of symptoms ,medication reviews / reconciliation during each weekly call . Any updates , inconsistencies, discrepancies or acute care concerns will be addressed and routed to the correct Practitioner if indicated   [facility]  Please call the care guide team at [phone]  if you need to cancel or reschedule your appointment . For scheduled calls -Three attempts will be made to reach you -if the scheduled call is missed or  we are unable to reach the you after 3 attempts no additional outreach attempts will be made and the TOC follow-up will be closed .   If you need to speak to a Nurse you may  call me directly at the number below or if I am unavailable,and  your need is urgent  please call the main VBCI number at [phone] and ask to speak with one of the [facility] ( Transition of Care )  Nurses  .                                                                               Additionally, If you experience worsening of your symptoms, develop shortness of breath, If you are experiencing a medical emergency,  develop suicidal or homicidal thoughts you must seek medical attention immediately by calling 911 or report to your local emergency department or urgent care.   If you have a non-emergency medical problem during routine business hours, please contact your provider's office and ask to speak with a nurse.       Please take the time to read instructions/literature along with the possible adverse reactions/side effects for all the Medicines that have been prescribed to you. Only take newly prescribed  Medications after you have completely understood and accept all the possible adverse  reactions/side effects.   Do not take more than prescribed Medications for  Pain, Sleep and Anxiety. Do not drive when taking Pain medications or sleep aid/ insomnia  medications It is not advisable to combine anxiety, sleep and pain medications without talking with your primary care practitioner    If you are experiencing a Mental Health or Behavioral Health Crisis or need someone to talk to Please call the Suicide and Crisis Lifeline: 55 You may also call the [place]  National Suicide Prevention Lifeline: 812-067-5214 or TTY: 984 347 1188 TTY 229 693 8484) to talk to a trained counselor.  You may call the Behavioral Health Crisis Line at 289-812-1794, at any time, 24 hours a day, 7 days a week- however If you are in danger or need immediate medical attention, call 911.   If you would like help to quit smoking, call 1-800-QUIT-NOW ( 408-845-5236) OR Espaol: 1-855-Djelo-Ya (8-144-664-6430) o para ms informacin haga clic aqu or Text READY to 799-599 to register via text.   Bari Mayans , BSN, RN Care Management Coordinator  [facility]   [facility] [email or web address] Direct Dial: [phone]

## 2023-04-24 NOTE — Patient Outreach (Signed)
 Care Management  Transitions of Care Program Transitions of Care Post-discharge week 3   04/24/2023 Name: CLARABELLE OSCARSON MRN: [id number] DOB: Oct 16, 1945  Subjective: Leightyn KEHLANI VANCAMP is a 78 y.o. year old female who is a primary care patient of Melvin Pao, NP. The Care Management team Engaged with patient Engaged with patient by telephone to assess and address transitions of care needs.   Consent to Services:  Patient was given information about care management services, agreed to services, and gave verbal consent to participate.   Assessment:   Patient voices no new complaints Patient has not developed/ reported any new Medical issues / Dx or acute changes.- since last follow-up call for most recent  Hospital stay    12/6-12/10 / 2024  She is doing well Pleasant and talkative. She continues with [facility] and states she is performing exercises and often does them in bed . She needs to lose another 20 + pounds in order to have Knee surgery . She reports chronic pain and bone to bone peer recent testing She declined offer of Nutritionist for diet plans . She has appt with PCP 1/22 She is scheduled to see Cardiology 1/25 and 1/28.  She us  having a Mammogram 04/30/23 .She denies increased swelling in LE or changes in breathing pattern  . Medication reconciliation / review completed based on most recent discharge summary and EHR medication list. Confirmed patient is taking all newly prescribed medications as instructed and is aware of any changes to and / or dosage adjustments to medication regimen. Patient denies questions at this time  and reports no barriers to medication adherence  Patient educated on red flag s/s to watch for and was encouraged to report, any changes in baseline or  medication regimen,  changes in health status  /  well-being, safety concerns  or any new unmanaged side effects or symptoms not relieved with interventions  to PCP and / or the  VBCI Case Management team            SDOH Interventions    Flowsheet Row Patient Outreach from 04/15/2023 in [facility] POPULATION HEALTH DEPARTMENT Telephone from 03/29/2023 in  POPULATION HEALTH DEPARTMENT ED to Hosp-Admission (Discharged) from 03/22/2023 in [facility] REGIONAL CARDIAC MED PCU Clinical Support from 02/22/2021 in [facility] Office Visit from 11/03/2020 in [facility] Office Visit from 01/05/2019 in [facility]  SDOH Interventions        Food Insecurity Interventions -- Intervention Not Indicated -- Intervention Not Indicated -- --  Housing Interventions -- Intervention Not Indicated Intervention Not Indicated Intervention Not Indicated -- --  Transportation Interventions -- Intervention Not Indicated Intervention Not Indicated Intervention Not Indicated -- --  Utilities Interventions -- Intervention Not Indicated -- -- -- --  Depression Interventions/Treatment  -- -- -- -- Medication Patient refuses Treatment  Financial Strain Interventions -- -- Intervention Not Indicated Intervention Not Indicated -- --  Stress Interventions -- -- -- Intervention Not Indicated -- --  Social Connections Interventions Intervention Not Indicated, Other (Comment)  [Watches TV, has support] -- -- Intervention Not Indicated -- --        Goals Addressed             This Visit's Progress    TOC Care Plan       Current Barriers:  Care Coordination needs related to Limited social support, Level of care concerns, ADL IADL limitations, Inability to perform ADL's independently, and Inability  to perform IADL's independently Chronic Disease Management support and education needs related to CHF   RNCM Clinical Goal(s):  Patient will work with the Care Management team over the next 30 days to address Transition of Care Barriers: Medication Management Support at home Provider appointments Home Health services take all medications exactly as  prescribed and will call provider for medication related questions as evidenced by no missed medications no unmanaged side effects  attend all scheduled medical appointments: with PCP, Cardiology and Home Health  as evidenced by no missed appointments  work with child psychotherapist to address  related to the management of Limited social support, Transportation, Level of care concerns, and ADL IADL limitations related to the management of CHF as evidenced by review of EMR and patient or child psychotherapist report through collaboration with Medical Illustrator, provider, and care team.   Interventions: Evaluation of current treatment plan related to  self management and patient's adherence to plan as established by provider-Pulmonology f/u 12/13, continue CPAP @ night, O2 3 L/M PRN, enc weight  loss   Transitions of Care:  Goal on track:  Yes. Doctor Visits  - discussed the importance of doctor visits Contacted Health RN/OT/PT [place] Cella [phone] visits scheduled completing exercises    Post discharge activity limitations prescribed by provider reviewed  Heart Failure Interventions:  (Status: On Track  ) Short Term Goal Basic overview and discussion of pathophysiology of Heart Failure reviewed Provided education on low sodium diet Assessed need for readable accurate scales in home Provided education about placing scale on hard, flat surface Discussed importance of daily weight and advised patient to weigh and record daily Reviewed role of diuretics in prevention of fluid overload and management of heart failure; Discussed the importance of keeping all appointments with provider Provided patient with education about the role of exercise in the management of heart failure  Patient Goals/Self-Care Activities: Participate in Transition of Care Program/Attend TOC scheduled calls Take all medications as prescribed Attend all scheduled provider appointments Call pharmacy for medication refills 3-7 days in  advance of running out of medications Perform all self care activities independently  Perform IADL's (shopping, preparing meals, housekeeping, managing finances) independently Call provider office for new concerns or questions   Follow Up Plan:  Telephone follow up appointment with care management team member scheduled for:  1/ 16/25 10:00 am The patient has been provided with contact information for the care management team and has been advised to call with any health related questions or concerns.  Follow up with provider re: follow-up appointments , Home Health and DME, visit today - doing exercises           Plan:  Routine follow-up and on-going assessment evaluation and education of disease processes, recommended interventions for both chronic and acute medical conditions , will occur during each weekly visit along with ongoing review of symptoms ,medication reviews and reconciliation. Any updates , inconsistencies, discrepancies or acute care concerns will be addressed and routed to the correct Practitioner if indicated   Based on current information and Insurance plan -Reviewed benefits available to patient, including details about eligibility options for care if any area of needs were identified.  Reviewed patients ability to access and / or navigating the benefits system..Amb Referral made if indicted , refer to orders section of note for details   Please refer to Care Plan for goals and interventions -Effectiveness of interventions, symptom management and outcomes will be evaluated  weekly during [facility] 30-day Program Outreach  calls  . Any necessary  changes and updates to Care Plan will be completed episodically    Reviewed goals for care Patient verbalizes understanding of instructions and care plan provided. Patient was encouraged to make informed decisions about their care, actively participate in managing their health condition, and implement lifestyle changes as needed to promote  independence and self-management of health care    The patient has been provided with contact information for the care management team and has been advised to call with any health-related questions or concerns.   The patient has been provided with contact information for the care management team and has been advised to call with any health related questions or concerns.   Bari Mayans , BSN, RN Care Management Coordinator    [facility] [email or web address] Direct Dial: [phone]

## 2023-04-30 ENCOUNTER — Ambulatory Visit
Admission: RE | Admit: 2023-04-30 | Discharge: 2023-04-30 | Disposition: A | Discharge status: Home / Self Care | Payer: 59 | Source: Ambulatory Visit | Attending: Nurse Practitioner | Admitting: Nurse Practitioner

## 2023-04-30 DIAGNOSIS — Z1231 Encounter for screening mammogram for malignant neoplasm of breast: Secondary | ICD-10-CM | POA: Insufficient documentation

## 2023-05-02 ENCOUNTER — Other Ambulatory Visit: Payer: Self-pay

## 2023-05-02 NOTE — Patient Outreach (Signed)
Care Management  Transitions of Care Program Transitions of Care Post-discharge week 4   05/02/2023 Name: Michelle Jenkins MRN: [id number] DOB: May 29, 1945  Subjective: Michelle Jenkins is a 78 y.o. year old female who is a primary care patient of Larae Grooms, NP. The Care Management team Engaged with patient Engaged with patient by telephone to assess and address transitions of care needs.   Consent to Services:  Patient was given information about care management services, agreed to services, and gave verbal consent to participate.   Assessment:   Patient/ Caregiver  voices no new complaints or concerns  and has not developed/ reported any new medical issues / Dx or acute changes. - since last follow-up call for most recent Hospital stay     12/6-12/10/ / 2024 he is followed by [facility], and PT/OT. She has PCP 05/08/23, Cardiologist 1/28. She had a mammogram 1/14 results pending She si still tryng to lose weight in order to have knee surgery( weight today 276). She reports she is completing exercises as directed .She reports LROM right arm but adds this is not new PT is aware and provided some interventions. She plans on following up with PCP on a new referral for vascular to address ongoing neuropathy  . She's maintaining fluid restriction 1.5 L/ day  Medication reconciliation / review completed based on EHR medication list. Confirmed patient is taking all newly prescribed medications as instructed (any discrepancies are noted in review section)   Patient / Caregiver is aware of any changes to and / or  any dosage adjustments to medication regimen. Patient/ Caregiver denies questions at this time and reports no barriers to medication adherence  Patient / Caregiver educated on red flag s/s to watch for and was encouraged to report, any changes in baseline or  medication regimen,  changes in health status  /  well-being, safety concerns  or any new unmanaged side effects or  symptoms not relieved with interventions  to PCP and / or the  VBCI Case Management team           SDOH Interventions    Flowsheet Row Patient Outreach from 04/15/2023 in [place] POPULATION HEALTH DEPARTMENT Telephone from 03/29/2023 in [place] POPULATION HEALTH DEPARTMENT ED to Hosp-Admission (Discharged) from 03/22/2023 in [facility] REGIONAL CARDIAC MED PCU Clinical Support from 02/22/2021 in [facility] Office Visit from 11/03/2020 in [facility] Office Visit from 01/05/2019 in [facility]  SDOH Interventions        Food Insecurity Interventions -- Intervention Not Indicated -- Intervention Not Indicated -- --  Housing Interventions -- Intervention Not Indicated Intervention Not Indicated Intervention Not Indicated -- --  Transportation Interventions -- Intervention Not Indicated Intervention Not Indicated Intervention Not Indicated -- --  Utilities Interventions -- Intervention Not Indicated -- -- -- --  Depression Interventions/Treatment  -- -- -- -- Medication Patient refuses Treatment  Financial Strain Interventions -- -- Intervention Not Indicated Intervention Not Indicated -- --  Stress Interventions -- -- -- Intervention Not Indicated -- --  Social Connections Interventions Intervention Not Indicated, Other (Comment)  [Watches TV, has support] -- -- Intervention Not Indicated -- --        Goals Addressed             This Visit's Progress    COMPLETED: TOC Care Plan       Current Barriers:  Care Coordination needs related to Limited social support, Level  of care concerns, ADL IADL limitations, Inability to perform ADL's independently, and Inability to perform IADL's independently Chronic Disease Management support and education needs related to CHF   RNCM Clinical Goal(s):  Patient will work with the Care Management team over the next 30 days to address Transition of Care Barriers: Medication  Management Support at home Provider appointments Home Health services take all medications exactly as prescribed and will call provider for medication related questions as evidenced by no missed medications no unmanaged side effects  attend all scheduled medical appointments: with PCP, Cardiology and Home Health  as evidenced by no missed appointments  work with Child psychotherapist to address  related to the management of Limited social support, Transportation, Level of care concerns, and ADL IADL limitations related to the management of CHF as evidenced by review of EMR and patient or Child psychotherapist report through collaboration with Medical illustrator, provider, and care team.   Interventions: Evaluation of current treatment plan related to  self management and patient's adherence to plan as established by provider-Pulmonology f/u 12/13, continue CPAP @ night, O2 3 L/M PRN, enc weight  loss   Transitions of Care:  Goal Met. Doctor Visits  - discussed the importance of doctor visits Contacted Health RN/OT/PT [place] [phone] visits scheduled completing exercises    Post discharge activity limitations prescribed by provider reviewed Reviewed Signs and symptoms of infection  Heart Failure Interventions:  (Status: On Track  ) Short Term Goal Basic overview and discussion of pathophysiology of Heart Failure reviewed Provided education on low sodium diet Assessed need for readable accurate scales in home Provided education about placing scale on hard, flat surface Discussed importance of daily weight and advised patient to weigh and record daily Reviewed role of diuretics in prevention of fluid overload and management of heart failure; Discussed the importance of keeping all appointments with provider Provided patient with education about the role of exercise in the management of heart failure  Patient Goals/Self-Care Activities: Participate in Transition of Care Program/Attend TOC scheduled  calls Take all medications as prescribed Attend all scheduled provider appointments Call pharmacy for medication refills 3-7 days in advance of running out of medications Perform all self care activities independently  Perform IADL's (shopping, preparing meals, housekeeping, managing finances) independently Call provider office for new concerns or questions   Follow Up Plan:  The patient has been provided with contact information for the care management team and has been advised to call with any health related questions or concerns.  Follow up with provider re: follow-up appointments right arm pain and LROM  , Home Health and DME, visit today - doing exercises           Plan:  The patient has completed the 30-day TOC Program. Condition is stable No further acute needs identified at this time. Chronic conditions and ongoing care is  managed thru collaboration with  PCP,  Specialists , VBCI CCM Nurse and additional Healthcare Providers if indicated . Patient verbalized understanding of ongoing plan of care.Appt scheduled for next Tuesday @ 2PM   SDOH needs have been screened and interventions provided if identified.   Reviewed current home medications -- provided education as needed.  Previously discussed rationale of use, how/when to take medications. Patient is aware of potential side effects, and was encouraged to notify PCP for any changes in condition or signs / symptoms not relieved  with interventions.   Patient will call 911 for Medical Emergencies or Life -Threatening or report  to a local emergency department or urgent care.   Patient was encouraged to Contact PCP  with any questions or concerns regarding ongoing  medical care, any  difficulty obtaining or picking up  prescriptions, any  changes or  worsening in  condition including signs / symptoms not relieved  with interventions  Patient had no additional questions or concerns at this time. Current needs addressed.    The patient  has been provided with contact information for the care management team and has been advised to call with any health related questions or concerns. She did have trouble keeping numbers straight and got mixed up with multiple providers scheduling calls and visits   Susa Loffler , BSN, RN [facility]   [facility] Health RN Care Manager Direct Dial [phone] Fax [phone] Website: [email or web address]

## 2023-05-07 ENCOUNTER — Ambulatory Visit: Payer: Self-pay | Admitting: *Deleted

## 2023-05-07 NOTE — Patient Instructions (Signed)
Visit Information  Thank you for taking time to visit with me today. Please don't hesitate to contact me if I can be of assistance to you before our next scheduled telephone appointment.  Following are the goals we discussed today:  Read attached heart failure action plan.  Weigh daily, notify MD if weight is greater than 3 pounds overnight or greater than 5 pounds in a week.   Our next appointment is by telephone on 2/19  Please call the care guide team at [phone] if you need to cancel or reschedule your appointment.   Please call the Suicide and Crisis Lifeline: 988 call the [place] National Suicide Prevention Lifeline: (413) 084-5308 or TTY: 539-035-5716 TTY (331)291-1412) to talk to a trained counselor call 1-800-273-TALK (toll free, 24 hour hotline) call 911 if you are experiencing a Mental Health or Behavioral Health Crisis or need someone to talk to.  Patient verbalizes understanding of instructions and care plan provided today and agrees to view in MyChart. Active MyChart status and patient understanding of how to access instructions and care plan via MyChart confirmed with patient.     The patient has been provided with contact information for the care management team and has been advised to call with any health related questions or concerns.   Rodney Langton, RN, MSN, CCM [facility], [facility] Health RN Care Coordinator Direct Dial: [phone] / Main [phone] Fax [phone] Email: [email or web address] Website: [email or web address]

## 2023-05-07 NOTE — Patient Outreach (Signed)
Care Coordination   Initial Visit Note   05/07/2023 Name: Michelle Jenkins MRN: [id number] DOB: 03-07-1946  Michelle Jenkins is a 78 y.o. year old female who sees Larae Grooms, NP for primary care. I spoke with  Lowella Grip by phone today.  What matters to the patients health and wellness today?  Patient admitted to hospital 12/6-12/10 for CHF.  She does not fully understand HF zones, will continue to education on proper management.    Goals Addressed             This Visit's Progress    Management of chronic medical conditions       Interventions Today    Flowsheet Row Most Recent Value  Chronic Disease   Chronic disease during today's visit Chronic Obstructive Pulmonary Disease (COPD), Congestive Heart Failure (CHF), Other  [OSA]  General Interventions   General Interventions Discussed/Reviewed General Interventions Reviewed, Doctor Visits, Durable Medical Equipment (DME), Community Resources  [Working with [facility] through [facility]]  Doctor Visits Discussed/Reviewed Doctor Visits Reviewed, PCP, Specialist  [Reviewed upcoming: PCP 1/22, Cardiology 1/28, MRI on 2/5]  Durable Medical Equipment (DME) Oxygen, Other  [CPAP, wears oxygen when out of the home]  PCP/Specialist Visits Compliance with follow-up visit  [Attended folow up appointments post hospital discharge: pulmonary 12/13, PCP 12/19 and cardiology 12/19]  Exercise Interventions   Exercise Discussed/Reviewed Physical Activity, Weight Managment  Physical Activity Discussed/Reviewed Physical Activity Reviewed  [Actively working with PT, encouraged to use provided exercises on non-therapy days]  Weight Management Weight maintenance, Weight loss  [Working on weight loss to help get approval for knee surgery. She is currently 275 pounds, goal of 260]  Education Interventions   Education Provided Provided Education  Provided Verbal Education On Nutrition, Medication, Exercise, When to see the doctor  [Reviewed HF management  with patient, HF zones reviewed and sent to pt. Medications reviewed, educated on proper use and assessed for need for medication assistance]              SDOH assessments and interventions completed:  No  SDOH Interventions    Flowsheet Row Patient Outreach from 04/15/2023 in [place] POPULATION HEALTH DEPARTMENT Telephone from 03/29/2023 in [place] POPULATION HEALTH DEPARTMENT ED to Hosp-Admission (Discharged) from 03/22/2023 in [facility] REGIONAL CARDIAC MED PCU Clinical Support from 02/22/2021 in [facility] Office Visit from 11/03/2020 in [facility] Office Visit from 01/05/2019 in [facility]  SDOH Interventions        Food Insecurity Interventions -- Intervention Not Indicated -- Intervention Not Indicated -- --  Housing Interventions -- Intervention Not Indicated Intervention Not Indicated Intervention Not Indicated -- --  Transportation Interventions -- Intervention Not Indicated Intervention Not Indicated Intervention Not Indicated -- --  Utilities Interventions -- Intervention Not Indicated -- -- -- --  Depression Interventions/Treatment  -- -- -- -- Medication Patient refuses Treatment  Financial Strain Interventions -- -- Intervention Not Indicated Intervention Not Indicated -- --  Stress Interventions -- -- -- Intervention Not Indicated -- --  Social Connections Interventions Intervention Not Indicated, Other (Comment)  [Watches TV, has support] -- -- Intervention Not Indicated -- --          Care Coordination Interventions:  Yes, provided   Follow up plan: Follow up call scheduled for 2/19    Encounter Outcome:  Patient Visit Completed   Rodney Langton, RN, MSN, CCM [place]  [facility] Health RN Care Coordinator  Direct Dial: [phone] / Main [phone] Fax [phone] Email: [email or web address] Website: [email or web address]

## 2023-05-08 ENCOUNTER — Ambulatory Visit: Payer: 59 | Admitting: Nurse Practitioner

## 2023-05-08 NOTE — Progress Notes (Deleted)
There were no vitals taken for this visit.   Subjective:    Patient ID: Michelle Jenkins, female    DOB: 1945-12-23, 78 y.o.   MRN: [id number]  HPI: Michelle Jenkins is a 78 y.o. female  No chief complaint on file.  HYPERTENSION / HYPERLIPIDEMIA Satisfied with current treatment? yes Duration of hypertension: years BP monitoring frequency: not checking BP range:  BP medication side effects: no Past BP meds: olmesartan-HCTZ, Lasix Duration of hyperlipidemia: years Cholesterol medication side effects: no Cholesterol supplements: none Past cholesterol medications: atorvastain (lipitor) Medication compliance: excellent compliance Aspirin: no Recent stressors: no Recurrent headaches: no Visual changes: no Palpitations: no Dyspnea: yes Chest pain: no Lower extremity edema: no Dizzy/lightheaded: no  COPD See's pulmonology on May 21. COPD status: controlled Satisfied with current treatment?: yes Oxygen use: yes (3L) Dyspnea frequency: SOB Cough frequency: Sometimes Rescue inhaler frequency:   Limitation of activity: yes Productive cough: yes Last Spirometry:  Pneumovax: Up to Date Influenza: Up to Date  Impaired Fasting Glucose HbA1C:  Lab Results  Component Value Date   HGBA1C 6.0 (H) 03/04/2023   Duration of elevated blood sugar:  Polydipsia: {Blank single:19197::"yes","no"} Polyuria: {Blank single:19197::"yes","no"} Weight change: {Blank single:19197::"yes","no"} Visual disturbance: {Blank single:19197::"yes","no"} Glucose Monitoring: {Blank single:19197::"yes","no"}    Accucheck frequency: {Blank single:19197::"Not Checking","Daily","BID","TID"}    Fasting glucose:     Post prandial:  Diabetic Education: {Blank single:19197::"Completed","Not Completed"} Family history of diabetes: {Blank single:19197::"yes","no"}  Relevant past medical, surgical, family and social history reviewed and updated as indicated. Interim medical history since our last visit  reviewed. Allergies and medications reviewed and updated.  Review of Systems  Per HPI unless specifically indicated above     Objective:    There were no vitals taken for this visit.  Wt Readings from Last 3 Encounters:  04/04/23 287 lb 6.4 oz (130.4 kg)  03/24/23 (!) 302 lb 0.5 oz (137 kg)  03/04/23 283 lb 12.8 oz (128.7 kg)    Physical Exam  Results for orders placed or performed in visit on 04/04/23  Comp Met (CMET)   Collection Time: 04/04/23 11:29 AM  Result Value Ref Range   Glucose 96 70 - 99 mg/dL   BUN 14 8 - 27 mg/dL   Creatinine, Ser 5.62 (H) 0.57 - 1.00 mg/dL   eGFR 49 (L) >13 YQ/MVH/8.46   BUN/Creatinine Ratio 12 12 - 28   Sodium 145 (H) 134 - 144 mmol/L   Potassium 4.0 3.5 - 5.2 mmol/L   Chloride 105 96 - 106 mmol/L   CO2 25 20 - 29 mmol/L   Calcium 9.2 8.7 - 10.3 mg/dL   Total Protein 6.9 6.0 - 8.5 g/dL   Albumin 4.4 3.8 - 4.8 g/dL   Globulin, Total 2.5 1.5 - 4.5 g/dL   Bilirubin Total 0.6 0.0 - 1.2 mg/dL   Alkaline Phosphatase 76 44 - 121 IU/L   AST 19 0 - 40 IU/L   ALT 13 0 - 32 IU/L      Assessment & Plan:   Problem List Items Addressed This Visit       Cardiovascular and Mediastinum   Primary pulmonary hypertension (HCC)   Hypertension   Pulmonary artery hypertension (HCC)   Atrial fibrillation (HCC)     Respiratory   Obstructive apnea - Primary     Endocrine   IFG (impaired fasting glucose)     Other   Hypercholesteremia     Follow up plan: No follow-ups on file.

## 2023-05-20 ENCOUNTER — Encounter (HOSPITAL_COMMUNITY): Payer: Self-pay

## 2023-05-22 ENCOUNTER — Ambulatory Visit
Admission: RE | Admit: 2023-05-22 | Discharge: 2023-05-22 | Disposition: A | Discharge status: Home / Self Care | Payer: 59 | Source: Ambulatory Visit | Attending: Internal Medicine | Admitting: Internal Medicine

## 2023-05-22 ENCOUNTER — Other Ambulatory Visit: Payer: Self-pay | Admitting: Internal Medicine

## 2023-05-22 DIAGNOSIS — Z8679 Personal history of other diseases of the circulatory system: Secondary | ICD-10-CM

## 2023-05-22 DIAGNOSIS — I509 Heart failure, unspecified: Secondary | ICD-10-CM | POA: Insufficient documentation

## 2023-05-22 DIAGNOSIS — R0602 Shortness of breath: Secondary | ICD-10-CM | POA: Insufficient documentation

## 2023-05-22 MED ORDER — GADOBUTROL 1 MMOL/ML IV SOLN
16.0000 mL | Freq: Once | INTRAVENOUS | Status: AC | PRN
  Administered 2023-05-22: 16 mL via INTRAVENOUS

## 2023-05-23 ENCOUNTER — Encounter: Payer: Self-pay | Admitting: Nurse Practitioner

## 2023-05-23 ENCOUNTER — Ambulatory Visit: Payer: 59 | Admitting: Nurse Practitioner

## 2023-05-23 VITALS — BP 109/71 | HR 90 | Ht 65.0 in | Wt 283.2 lb

## 2023-05-23 DIAGNOSIS — I10 Essential (primary) hypertension: Secondary | ICD-10-CM | POA: Diagnosis not present

## 2023-05-23 DIAGNOSIS — I270 Primary pulmonary hypertension: Secondary | ICD-10-CM

## 2023-05-23 DIAGNOSIS — I5033 Acute on chronic diastolic (congestive) heart failure: Secondary | ICD-10-CM | POA: Diagnosis not present

## 2023-05-23 NOTE — Progress Notes (Signed)
 BP 109/71 (BP Location: Left Arm, Patient Position: Sitting, Cuff Size: Large)   Pulse 90   Ht 5' 5 (1.651 m)   Wt 283 lb 3.2 oz (128.5 kg)   SpO2 98%   BMI 47.13 kg/m    Subjective:    Patient ID: Michelle Jenkins, female    DOB: Aug 03, 1945, 78 y.o.   MRN: [id number]  HPI: Michelle Jenkins is a 78 y.o. female  Chief Complaint  Patient presents with   Hypertension   Hyperlipidemia   HYPERTENSION / HYPERLIPIDEMIA Had a Cardiac MRI yesterday. No results yet.  Doing well with Lasix  and  Satisfied with current treatment? yes Duration of hypertension: years BP monitoring frequency: not checking BP range:  BP medication side effects: no Past BP meds: olmesartan -HCTZ, Lasix  Duration of hyperlipidemia: years Cholesterol medication side effects: no Cholesterol supplements: none Past cholesterol medications: atorvastain (lipitor) Medication compliance: excellent compliance Aspirin: no Recent stressors: no Recurrent headaches: no Visual changes: no Palpitations: no Dyspnea: yes Chest pain: no Lower extremity edema: no Dizzy/lightheaded: no  COPD See's pulmonology regularly.  Doing well on her 3L.   COPD status: controlled Satisfied with current treatment?: yes Oxygen  use: yes (3L) Dyspnea frequency: SOB Cough frequency: Sometimes Rescue inhaler frequency:   Limitation of activity: yes Productive cough: yes Last Spirometry:  Pneumovax: Up to Date Influenza: Up to Date  Impaired Fasting Glucose Patient is on Jardiance .  HbA1C:  Lab Results  Component Value Date   HGBA1C 6.0 (H) 03/04/2023   Duration of elevated blood sugar:  Polydipsia: no Polyuria: no Weight change: no Visual disturbance: no Glucose Monitoring: no    Accucheck frequency: no    Fasting glucose:     Post prandial:  Diabetic Education: Not Completed Family history of diabetes: no  Relevant past medical, surgical, family and social history reviewed and updated as indicated. Interim medical  history since our last visit reviewed. Allergies and medications reviewed and updated.  Review of Systems  Eyes:  Negative for visual disturbance.  Respiratory:  Positive for shortness of breath. Negative for cough and chest tightness.   Cardiovascular:  Negative for chest pain, palpitations and leg swelling.  Endocrine: Negative for polydipsia and polyuria.  Neurological:  Negative for dizziness and headaches.    Per HPI unless specifically indicated above     Objective:    BP 109/71 (BP Location: Left Arm, Patient Position: Sitting, Cuff Size: Large)   Pulse 90   Ht 5' 5 (1.651 m)   Wt 283 lb 3.2 oz (128.5 kg)   SpO2 98%   BMI 47.13 kg/m   Wt Readings from Last 3 Encounters:  05/23/23 283 lb 3.2 oz (128.5 kg)  04/04/23 287 lb 6.4 oz (130.4 kg)  03/24/23 (!) 302 lb 0.5 oz (137 kg)    Physical Exam Vitals and nursing note reviewed.  Constitutional:      General: She is not in acute distress.    Appearance: Normal appearance. She is obese. She is not ill-appearing, toxic-appearing or diaphoretic.  HENT:     Head: Normocephalic.     Right Ear: External ear normal.     Left Ear: External ear normal.     Nose: Nose normal.     Mouth/Throat:     Mouth: Mucous membranes are moist.     Pharynx: Oropharynx is clear.  Eyes:     General:        Right eye: No discharge.        Left eye:  No discharge.     Extraocular Movements: Extraocular movements intact.     Conjunctiva/sclera: Conjunctivae normal.     Pupils: Pupils are equal, round, and reactive to light.  Cardiovascular:     Rate and Rhythm: Normal rate and regular rhythm.     Heart sounds: No murmur heard. Pulmonary:     Effort: Pulmonary effort is normal. No respiratory distress.     Breath sounds: Normal breath sounds. No wheezing or rales.     Comments: On 3L O2 Musculoskeletal:     Cervical back: Normal range of motion and neck supple.  Skin:    General: Skin is warm and dry.     Capillary Refill: Capillary  refill takes less than 2 seconds.  Neurological:     General: No focal deficit present.     Mental Status: She is alert and oriented to person, place, and time. Mental status is at baseline.  Psychiatric:        Mood and Affect: Mood normal.        Behavior: Behavior normal.        Thought Content: Thought content normal.        Judgment: Judgment normal.     Results for orders placed or performed in visit on 04/04/23  Comp Met (CMET)   Collection Time: 04/04/23 11:29 AM  Result Value Ref Range   Glucose 96 70 - 99 mg/dL   BUN 14 8 - 27 mg/dL   Creatinine, Ser 8.86 (H) 0.57 - 1.00 mg/dL   eGFR 49 (L) >40 fO/fpw/8.26   BUN/Creatinine Ratio 12 12 - 28   Sodium 145 (H) 134 - 144 mmol/L   Potassium 4.0 3.5 - 5.2 mmol/L   Chloride 105 96 - 106 mmol/L   CO2 25 20 - 29 mmol/L   Calcium  9.2 8.7 - 10.3 mg/dL   Total Protein 6.9 6.0 - 8.5 g/dL   Albumin 4.4 3.8 - 4.8 g/dL   Globulin, Total 2.5 1.5 - 4.5 g/dL   Bilirubin Total 0.6 0.0 - 1.2 mg/dL   Alkaline Phosphatase 76 44 - 121 IU/L   AST 19 0 - 40 IU/L   ALT 13 0 - 32 IU/L      Assessment & Plan:   Problem List Items Addressed This Visit       Cardiovascular and Mediastinum   Primary pulmonary hypertension (HCC) - Primary   Stable at 3L.  Seeing pulmonary.  Continue to follow up with specialist.       Hypertension   Chronic.  Controlled.  Continue with current medication regimen.   Return to clinic in 3 months for reevaluation.  Call sooner if concerns arise.        Diastolic CHF, acute on chronic (HCC)   Chronic.  Doing well with current regimen.  Had Cardiac MRI yesterday. Follows up with Cardiology next month.  Endorses weighing daily.   - Reminded to call for an overnight weight gain of >2 pounds or a weekly weight gain of >5 pounds - not adding salt to food and read food labels. Reviewed the importance of keeping daily sodium intake to 2000mg  daily. - Avoid Ibuprofen products.          Follow up  plan: Return in about 3 months (around 08/20/2023) for HTN, HLD, DM2 FU.

## 2023-05-23 NOTE — Assessment & Plan Note (Signed)
 Stable at 3L.  Seeing pulmonary.  Continue to follow up with specialist.

## 2023-05-23 NOTE — Assessment & Plan Note (Signed)
 Chronic.  Doing well with current regimen.  Had Cardiac MRI yesterday. Follows up with Cardiology next month.  Endorses weighing daily.   - Reminded to call for an overnight weight gain of >2 pounds or a weekly weight gain of >5 pounds - not adding salt to food and read food labels. Reviewed the importance of keeping daily sodium intake to 2000mg  daily. - Avoid Ibuprofen products.

## 2023-05-23 NOTE — Assessment & Plan Note (Signed)
Chronic.  Controlled.  Continue with current medication regimen.  Return to clinic in 3 months for reevaluation.  Call sooner if concerns arise.

## 2023-05-24 ENCOUNTER — Other Ambulatory Visit: Payer: Self-pay | Admitting: Nurse Practitioner

## 2023-05-27 NOTE — Telephone Encounter (Signed)
 Requested Prescriptions  Pending Prescriptions Disp Refills   albuterol  (VENTOLIN  HFA) 108 (90 Base) MCG/ACT inhaler [Pharmacy Med Name: ALBUTEROL  HFA INH (200 PUFFS) 8.5GM] 8.5 g 0    Sig: INHALE 1 TO 2 PUFFS INTO THE LUNGS EVERY 6 HOURS AS NEEDED     Pulmonology:  Beta Agonists 2 Passed - 05/27/2023 10:56 AM      Passed - Last BP in normal range    BP Readings from Last 1 Encounters:  05/23/23 109/71         Passed - Last Heart Rate in normal range    Pulse Readings from Last 1 Encounters:  05/23/23 90         Passed - Valid encounter within last 12 months    Recent Outpatient Visits           1 month ago Hospital discharge follow-up   [facility] Aileen Alexanders, NP   2 months ago Encounter for annual wellness exam in Medicare patient   [facility] Aileen Alexanders, NP   5 months ago Acute pain of right knee   [facility] Maybell, Megan P, DO   6 months ago Acute pain of right knee   [place] [facility] [place], Megan P, DO   9 months ago Annual physical exam   [facility] Aileen Alexanders, NP       Future Appointments             In 2 months Aileen Alexanders, NP [facility]   In 3 months Aileen Alexanders, NP [facility], [facility]

## 2023-05-29 ENCOUNTER — Ambulatory Visit: Payer: Self-pay | Admitting: *Deleted

## 2023-05-29 NOTE — Telephone Encounter (Signed)
  Chief Complaint: Having a problem getting her spirolactone 25 mg refilled.  It was prescribed by Dr. Allena Katz, cardiology with [facility] not Larae Grooms, NP.   This medication is not on her med list.   She says she is to take a whole pill now not a half a pill so she is running out early and her insurance will not pay for it.    (Order by Dr. Allena Katz is for 1/2 pill daily). Symptoms: N/A Frequency: N/A Pertinent Negatives: Patient denies N/A Disposition: [] ED /[] Urgent Care (no appt availability in office) / [] Appointment(In office/virtual)/ []  [place] [facility]/ [] Home Care/ [] Refused Recommended Disposition /[] [place] [facility]/ []  Follow-up with PCP Additional Notes: I let her know she needed to contact Dr. Allena Katz and gave her the number from his notes in the chart.   She hung up after I gave her the information.

## 2023-05-29 NOTE — Telephone Encounter (Signed)
Reason for Disposition  [1] Caller has URGENT medicine question about med that PCP or specialist prescribed AND [2] triager unable to answer question    Gave her the phone number for Dr. Eliane Decree practice with [facility].  Cardiology  Answer Assessment - Initial Assessment Questions 1. NAME of MEDICINE: "What medicine(s) are you calling about?"    Spironolatone 25 mg 2. QUESTION: "What is your question?" (e.g., double dose of medicine, side effect)     I've been trying to get this refilled for 2 weeks.   The doctor never changed my dose from a half a pill to a whole pill.   I'm taking a whole pill and I run out too soon.  The insurance thinks I'm taking a 1/2 pill but it was changed to a whole pill.  They won't pay for it.     The home health nurse knows about this.   She wrote it in her chart when she was here at my house.   She came after I was in the hospital. (Her chart shows Dr. Allena Katz ordered this and it is ordered as 1/2 pill).   Pt says this is not correct it was changed to a whole pill and now she is out. I let her know to contact Dr. Allena Katz at the [facility] and I gave her the phone number because he is the one that prescribes this.   It's not listed on her medication list.    Larae Grooms, NP is not the provider that prescribes this. 3. PRESCRIBER: "Who prescribed the medicine?" Reason: if prescribed by specialist, call should be referred to that group.    Dr. Allena Katz with [facility].   4. SYMPTOMS: "Do you have any symptoms?" If Yes, ask: "What symptoms are you having?"  "How bad are the symptoms (e.g., mild, moderate, severe)     /A 5. PREGNANCY:  "Is there any chance that you are pregnant?" "When was your last menstrual period?"  Protocols used: Medication Question Call-A-AH

## 2023-06-05 ENCOUNTER — Ambulatory Visit: Payer: Self-pay | Admitting: *Deleted

## 2023-06-05 NOTE — Patient Outreach (Signed)
  Care Coordination   Follow Up Visit Note   06/05/2023 Name: MATHA MASSE MRN: [id number] DOB: 20-Mar-1946  Petronella GWENDOLYNE WELFORD is a 78 y.o. year old female who sees Larae Grooms, NP for primary care. I spoke with  Lowella Grip by phone today.  What matters to the patients health and wellness today?  Report she is doing well, understanding more about how to take care for her conditions.  Denies any urgent concerns, encouraged to contact this care manager with questions.      Goals Addressed             This Visit's Progress    Management of chronic medical conditions   On track    Interventions Today    Flowsheet Row Most Recent Value  Chronic Disease   Chronic disease during today's visit Hypertension (HTN), Congestive Heart Failure (CHF), Chronic Obstructive Pulmonary Disease (COPD)  General Interventions   General Interventions Discussed/Reviewed General Interventions Reviewed, Doctor Visits  Doctor Visits Discussed/Reviewed Doctor Visits Reviewed, PCP, Specialist  [Upcoming with cardiology on 3/25]  PCP/Specialist Visits Compliance with follow-up visit  Exercise Interventions   Exercise Discussed/Reviewed Physical Activity, Weight Managment  Physical Activity Discussed/Reviewed Physical Activity Reviewed  Jorje Guild with [facility]]  Weight Management Weight maintenance  [Report weight down to 275 pounds]  Education Interventions   Education Provided Provided Education  Provided Verbal Education On Nutrition, Medication, When to see the doctor  [Medications reviewed, confirms she is taking as directed.  Denies any questions about doses and use. Report nurse from insurance company will visit the home on 2/27]  Nutrition Interventions   Nutrition Discussed/Reviewed Nutrition Reviewed, Decreasing salt, Adding fruits and vegetables  [Changing to low salt diet, also adhering to fluid restriction]              SDOH assessments and interventions completed:  No      Care Coordination Interventions:  Yes, provided   Follow up plan: Follow up call scheduled for 3/5    Encounter Outcome:  Patient Visit Completed   Rodney Langton, RN, MSN, CCM [place]  [facility] RN Care Coordinator Direct Dial: [phone] / Main [phone] Fax [phone] Email: [email or web address]: [email or web address]

## 2023-06-05 NOTE — Patient Instructions (Signed)
Visit Information  Thank you for taking time to visit with me today. Please don't hesitate to contact me if I can be of assistance to you before our next scheduled telephone appointment.  Following are the goals we discussed today:  Monitor weights and blood pressure/heart rate daily. Notify this RNCM if you have any questions about medications.  Do not have more than 2051ml/day and decrease salt intake.   Our next appointment is by telephone on 3/5  Please call the care guide team at [phone] if you need to cancel or reschedule your appointment.   Please call the Suicide and Crisis Lifeline: 988 call the [place] National Suicide Prevention Lifeline: (414)877-8405 or TTY: (780)515-1979 TTY 647-222-2549) to talk to a trained counselor call 1-800-273-TALK (toll free, 24 hour hotline) call 911 if you are experiencing a Mental Health or Behavioral Health Crisis or need someone to talk to.  Patient verbalizes understanding of instructions and care plan provided today and agrees to view in MyChart. Active MyChart status and patient understanding of how to access instructions and care plan via MyChart confirmed with patient.     The patient has been provided with contact information for the care management team and has been advised to call with any health related questions or concerns.   Rodney Langton, RN, MSN, CCM [facility], [facility] RN Care Coordinator Direct Dial: [phone] / Main [phone] Fax [phone] Email: [email or web address] Website: [email or web address]

## 2023-06-17 ENCOUNTER — Ambulatory Visit: Payer: Self-pay | Admitting: Nurse Practitioner

## 2023-06-17 ENCOUNTER — Emergency Department

## 2023-06-17 ENCOUNTER — Other Ambulatory Visit: Payer: Self-pay

## 2023-06-17 ENCOUNTER — Emergency Department
Admission: EM | Admit: 2023-06-17 | Discharge: 2023-06-17 | Disposition: A | Discharge status: Home / Self Care | Attending: Emergency Medicine | Admitting: Emergency Medicine

## 2023-06-17 DIAGNOSIS — R0600 Dyspnea, unspecified: Secondary | ICD-10-CM | POA: Diagnosis not present

## 2023-06-17 DIAGNOSIS — I110 Hypertensive heart disease with heart failure: Secondary | ICD-10-CM | POA: Insufficient documentation

## 2023-06-17 DIAGNOSIS — R079 Chest pain, unspecified: Secondary | ICD-10-CM | POA: Insufficient documentation

## 2023-06-17 DIAGNOSIS — Z7901 Long term (current) use of anticoagulants: Secondary | ICD-10-CM | POA: Insufficient documentation

## 2023-06-17 DIAGNOSIS — I5030 Unspecified diastolic (congestive) heart failure: Secondary | ICD-10-CM | POA: Diagnosis not present

## 2023-06-17 LAB — CBC
HCT: 36.9 % (ref 36.0–46.0)
Hemoglobin: 12.2 g/dL (ref 12.0–15.0)
MCH: 32 pg (ref 26.0–34.0)
MCHC: 33.1 g/dL (ref 30.0–36.0)
MCV: 96.9 fL (ref 80.0–100.0)
Platelets: 170 10*3/uL (ref 150–400)
RBC: 3.81 MIL/uL — ABNORMAL LOW (ref 3.87–5.11)
RDW: 14.9 % (ref 11.5–15.5)
WBC: 6.6 10*3/uL (ref 4.0–10.5)
nRBC: 0 % (ref 0.0–0.2)

## 2023-06-17 LAB — BASIC METABOLIC PANEL
Anion gap: 8 (ref 5–15)
BUN: 32 mg/dL — ABNORMAL HIGH (ref 8–23)
CO2: 29 mmol/L (ref 22–32)
Calcium: 9 mg/dL (ref 8.9–10.3)
Chloride: 105 mmol/L (ref 98–111)
Creatinine, Ser: 1.32 mg/dL — ABNORMAL HIGH (ref 0.44–1.00)
GFR, Estimated: 41 mL/min — ABNORMAL LOW (ref >60)
Glucose, Bld: 93 mg/dL (ref 70–99)
Potassium: 3.9 mmol/L (ref 3.5–5.1)
Sodium: 142 mmol/L (ref 135–145)

## 2023-06-17 LAB — RESP PANEL BY RT-PCR (RSV, FLU A&B, COVID)  RVPGX2
Influenza A by PCR: NEGATIVE
Influenza B by PCR: NEGATIVE
Resp Syncytial Virus by PCR: NEGATIVE
SARS Coronavirus 2 by RT PCR: NEGATIVE

## 2023-06-17 LAB — TROPONIN I (HIGH SENSITIVITY)
Troponin I (High Sensitivity): 11 ng/L (ref <18)
Troponin I (High Sensitivity): 11 ng/L (ref <18)

## 2023-06-17 MED ORDER — KETOROLAC TROMETHAMINE 15 MG/ML IJ SOLN: 15.0000 mg | Freq: Once | INTRAMUSCULAR | Status: DC

## 2023-06-17 MED ORDER — MORPHINE SULFATE (PF) 4 MG/ML IV SOLN: 4.0000 mg | Freq: Once | INTRAVENOUS | Status: DC

## 2023-06-17 MED ORDER — LIDOCAINE 5 % EX PTCH
2.0000 | MEDICATED_PATCH | Freq: Once | CUTANEOUS | Status: DC
  Administered 2023-06-17: 2 via TRANSDERMAL
  Filled 2023-06-17: qty 2

## 2023-06-17 NOTE — Telephone Encounter (Signed)
 Chief Complaint: Chest and neck pain Symptoms: Radiation of pain to both shoulder Frequency: Intermittent (happens when turning neck) Pertinent Negatives: Patient denies dizziness, nausea, vomiting, sweating, fever Disposition: [x] ED /[] Urgent Care (no appt availability in office) / [] Appointment(In office/virtual)/ []  [place] [facility]/ [] Home Care/ [] Refused Recommended Disposition /[] [place] [facility]/ []  Follow-up with PCP Additional Notes: Pt states she has had chest and neck pain since yesterday. Pt states the pain is a 9/10 when she turns her neck. The pain radiates to both shoulders depending on what side she turns her head. Pt states she has never had this happen before. Pt advised to go to ED but does not have someone available to take her. This RN called EMS and an ambulance is on the way. Pt notified that an ambulance will be there shortly. Pt states understanding and hung up.     Copied from CRM [id number]. Topic: Clinical - Red Word Triage >> Jun 17, 2023 12:45 PM Everette C wrote: [facility] that prompted transfer to Nurse Triage: The patient has been experiencing chest and neck pain since yesterday 06/16/23 Reason for Disposition  SEVERE chest pain  Answer Assessment - Initial Assessment Questions 1. LOCATION: "Where does it hurt?"       Chest and neck pain 2. RADIATION: "Does the pain go anywhere else?" (e.g., into neck, jaw, arms, back)     Radiates to both shoulders 3. ONSET: "When did the chest pain begin?" (Minutes, hours or days)      Yesterday 4. PATTERN: "Does the pain come and go, or has it been constant since it started?"  "Does it get worse with exertion?"      Intermittent 5. SEVERITY: "How bad is the pain?"  (e.g., Scale 1-10; mild, moderate, or severe)    - MILD (1-3): doesn't interfere with normal activities     - MODERATE (4-7): interferes with normal activities or awakens from sleep    - SEVERE (8-10): excruciating pain, unable to do any normal  activities       9/10 when turn neck 6. CAUSE: "What do you think is causing the chest pain?"     Not sure, it has never happened before 10. OTHER SYMPTOMS: "Do you have any other symptoms?" (e.g., dizziness, nausea, vomiting, sweating, fever, difficulty breathing, cough)       Pt states she has COPD so coughs  Protocols used: Chest Pain-A-AH

## 2023-06-17 NOTE — ED Provider Triage Note (Signed)
 Emergency Medicine Provider Triage Evaluation Note  Michelle Jenkins , a 78 y.o. female  was evaluated in triage.  Pt complains of left-sided chest pain, pressure radiating to left neck worse with movement.  Does admit to history of hypertension hyperlipidemia and COPD.  No cough congestion fevers or chills.  Anticoagulated on Eliquis.  Review of Systems  Positive:  Negative:   Physical Exam  BP 129/81   Pulse 68   Temp 98 F (36.7 C)   Resp 18   Ht 5\' 5"  (1.651 m)   Wt 127.9 kg   SpO2 99%   BMI 46.93 kg/m  Gen:   Awake, no distress   Resp:  Normal effort  MSK:   Moves extremities without difficulty  Other:    Medical Decision Making  Medically screening exam initiated at 4:28 PM.  Appropriate orders placed.  Michelle Jenkins was informed that the remainder of the evaluation will be completed by another provider, this initial triage assessment does not replace that evaluation, and the importance of remaining in the ED until their evaluation is complete.  Cardiac workup, doubt PE   Michelle Jenkins, [place] 06/17/23 1629

## 2023-06-17 NOTE — Telephone Encounter (Signed)
 Noted.

## 2023-06-17 NOTE — ED Provider Notes (Signed)
 [facility] ([place]) Provider Note    Event Date/Time   First MD Initiated Contact with Patient 06/17/23 1702     (approximate)   History   Chest Pain   HPI  Michelle Jenkins is a 78 y.o. female past medical history significant for HFpEF, hypertension, pulmonary hypertension, who presents to the emergency department with chest pain.  Patient states that she started having chest pain yesterday.  States that the pain is a sharp stinging pain that occurs whenever she moves her neck.  States that it only occurs whenever she looks to 1 side or another at the beginning of the movement.  Complaining of sharp stabbing pain that is not present at rest.  Denies any shortness of breath or pain with deep inspiration.  Called her primary care physician was told to come to the emergency department for evaluation.  Denies any falls or trauma.  Denies nausea, vomiting.  Denies abdominal pain.  Denies any history of DVT or PE.  Currently on anticoagulation with Eliquis and states that she has been compliant with this medication.  Does endorse some mild sore throat and cough.     Physical Exam   Triage Vital Signs: ED Triage Vitals  Encounter Vitals Group     BP 06/17/23 1341 (!) 130/92     Systolic BP Percentile --      Diastolic BP Percentile --      Pulse Rate 06/17/23 1339 (!) 108     Resp 06/17/23 1339 20     Temp 06/17/23 1339 98 F (36.7 C)     Temp Source 06/17/23 1339 Oral     SpO2 06/17/23 1339 95 %     Weight 06/17/23 1338 282 lb (127.9 kg)     Height 06/17/23 1338 5\' 5"  (1.651 m)     Head Circumference --      Peak Flow --      Pain Score 06/17/23 1338 9     Pain Loc --      Pain Education --      Exclude from Growth Chart --     Most recent vital signs: Vitals:   06/17/23 1830 06/17/23 1900  BP: (!) 140/89 95/79  Pulse: (!) 58 (!) 54  Resp: 15 17  Temp:  99.3 F (37.4 C)  SpO2: 97% 100%    Physical Exam Constitutional:      Appearance: She is  well-developed.  HENT:     Head: Atraumatic.  Eyes:     Conjunctiva/sclera: Conjunctivae normal.  Neck:     Comments: No midline cervical spine tenderness to palpation.  Does have tenderness to palpation with palpation of paraspinal muscles and left trapezius muscle.  Complaining of pain whenever she looks to her left and right.  No pain with flexion and extension. Cardiovascular:     Rate and Rhythm: Regular rhythm.     Pulses:          Radial pulses are 2+ on the right side and 2+ on the left side.       Dorsalis pedis pulses are 2+ on the right side and 2+ on the left side.       Posterior tibial pulses are 2+ on the right side and 2+ on the left side.     Heart sounds: Normal heart sounds.  Pulmonary:     Effort: No respiratory distress.  Abdominal:     General: There is no distension.     Palpations: Abdomen is  soft.     Tenderness: There is no abdominal tenderness.  Musculoskeletal:        General: Normal range of motion.     Cervical back: Normal range of motion.     Right lower leg: No tenderness. No edema.     Left lower leg: No tenderness. No edema.  Skin:    General: Skin is warm.     Capillary Refill: Capillary refill takes less than 2 seconds.  Neurological:     General: No focal deficit present.     Mental Status: She is alert. Mental status is at baseline.     IMPRESSION / MDM / ASSESSMENT AND PLAN / ED COURSE  I reviewed the triage vital signs and the nursing notes.  Differential diagnosis including cervical strain, musculoskeletal strain, ACS, pneumonia, viral illness covid/influenza.  Have a low suspicion for dissection, no tearing pain, no pain at rest, pulses are equal and symmetric.  EKG  I, Corena Herter, the attending physician, personally viewed and interpreted this ECG. Atrial fibrillation with significant artifact.  Nonspecific changes.  No significant ST elevation or depression.  No findings of acute ischemia or dysrhythmia.  No significant change  when compared to prior EKG   RADIOLOGY Chest x-ray with no acute findings.  Read a similar cardiomegaly.  No pulmonary edema no widened mediastinum noted.  LABS (all labs ordered are listed, but only abnormal results are displayed) Labs interpreted as -    Labs Reviewed  BASIC METABOLIC PANEL - Abnormal; Notable for the following components:      Result Value   BUN 32 (*)    Creatinine, Ser 1.32 (*)    GFR, Estimated 41 (*)    All other components within normal limits  CBC - Abnormal; Notable for the following components:   RBC 3.81 (*)    All other components within normal limits  RESP PANEL BY RT-PCR (RSV, FLU A&B, COVID)  RVPGX2  TROPONIN I (HIGH SENSITIVITY)  TROPONIN I (HIGH SENSITIVITY)     MDM  COVID influenza testing are negative.  Serial troponins are negative.  Low suspicion for ACS.  On reevaluation states that she only has chest pain whenever she moves her head and is not having any pain at this time.  Able to range her neck without any issues.  Denies any chest pain or shortness of breath.  Creatinine appears to be close to her baseline.  No significant electrolyte abnormality.  Have a low suspicion for pulmonary embolism, low risk Wells criteria, patient takes and is compliant with Eliquis and is not having any pleuritic chest pain or shortness of breath.  Low suspicion for dissection.  Possible cervical strain/musculoskeletal strain.  Provided a Lidoderm patch in the emergency department given multiple allergies and restriction to pain medication given her Eliquis.  Patient states she is feeling much better and wants to go home.  Discussed close follow-up with her primary care physician and calling tomorrow to schedule close appointment.  Discussed return to the emergency department if she had any ongoing or return of symptoms.     PROCEDURES:  Critical Care performed: No  Procedures  Patient's presentation is most consistent with acute presentation with  potential threat to life or bodily function.   MEDICATIONS ORDERED IN ED: Medications  lidocaine (LIDODERM) 5 % 2 patch (2 patches Transdermal Patch Applied 06/17/23 1932)    FINAL CLINICAL IMPRESSION(S) / ED DIAGNOSES   Final diagnoses:  Chest pain, unspecified type     Rx /  DC Orders   ED Discharge Orders     None        Note:  This document was prepared using Dragon voice recognition software and may include unintentional dictation errors.   Corena Herter, MD 06/17/23 [phone]

## 2023-06-17 NOTE — Discharge Instructions (Signed)
 You are seen in the emergency department for chest pain that was worse whenever you moved your head/neck.  You had a chest x-ray done that did not show any signs of pneumonia.  Your lab work was overall normal.  Your EKG showed A-fib.  You had 2 heart enzymes that were normal, do not believe you are having a heart attack today.  You can use over-the-counter Lidoderm patches that are 4% and applied to bilateral neck for pain control.  You can alternate ice and heat.  Call and follow-up closely with your primary care physician.  Return to the emergency department if you have any worsening symptoms.  You had a COVID and influenza testing done today, I will call you if these results are positive.  If your influenza test is positive I will call you in a prescription to Tamiflu to your pharmacy  Thank you for choosing [place] for your health care, it was my pleasure to care for you today!  Corena Herter, MD

## 2023-06-17 NOTE — ED Triage Notes (Signed)
 Patient states upper chest pain only when moving head or neck; started yesterday.

## 2023-06-19 ENCOUNTER — Telehealth: Payer: Self-pay

## 2023-06-19 ENCOUNTER — Ambulatory Visit: Payer: Self-pay | Admitting: *Deleted

## 2023-06-19 NOTE — Patient Outreach (Signed)
 Care Coordination   Follow Up Visit Note   06/19/2023 Name: Michelle Jenkins MRN: [id number] DOB: [birth year]  Michelle Jenkins is a 78 y.o. year old female who sees Larae Grooms, NP for primary care. I spoke with  Lowella Grip by phone today.  What matters to the patients health and wellness today?  Patient state doing better since seen in the ED earlier this week.  State she was having some aches and soreness in the chest area, called MD and was told to go to the hospital.  Denies any urgent concerns, encouraged to contact this care manager with questions.     Goals Addressed             This Visit's Progress    Management of chronic medical conditions   On track    Interventions Today    Flowsheet Row Most Recent Value  Chronic Disease   Chronic disease during today's visit Congestive Heart Failure (CHF), Chronic Obstructive Pulmonary Disease (COPD)  General Interventions   General Interventions Discussed/Reviewed General Interventions Reviewed, Doctor Visits, Durable Medical Equipment (DME)  [Seen in ED on Monday with chest discomfort, per notes, likely muscloskeletal, has not had any further discomfort]  Doctor Visits Discussed/Reviewed Doctor Visits Reviewed, PCP, Specialist  [Upcoming cardiology on 3/25, PCP on 5/9. Does not feel sooner appointment with PCP is needed, but she will call if she has questions]  Durable Medical Equipment (DME) BP Cuff, Other  [scale]  PCP/Specialist Visits Compliance with follow-up visit  Exercise Interventions   Exercise Discussed/Reviewed Weight Managment  Weight Management Weight loss, Weight maintenance  [Report still working on weight loss, was as high as 320s last year, report as low as 275 pounds this year, today 280, yesterday 282]  Education Interventions   Education Provided Provided Education  Provided Verbal Education On When to see the doctor, Medication  [Meds reviewed, taking as instructed. Encouraged to monitor blood pressure  more often and notify MD with changes.]              SDOH assessments and interventions completed:  No     Care Coordination Interventions:  Yes, provided   Follow up plan: Follow up call scheduled for 4/8 with Wynona Neat, RNCM    Encounter Outcome:  Patient Visit Completed   Rodney Langton, RN, MSN, CCM [facility], [facility] Health RN Care Coordinator Direct Dial: [phone] / Main [phone] Fax [phone] Email: [email or web address]: [email or web address]

## 2023-06-19 NOTE — Telephone Encounter (Signed)
Called and notified patient of Karen's message.  

## 2023-06-19 NOTE — Telephone Encounter (Signed)
   Copied from CRM [id number]. Topic: Clinical - Lab/Test Results >> Jun 18, 2023  3:45 PM Carlatta H wrote: Reason for CRM: Please call patient back lab results from hospital//

## 2023-06-19 NOTE — Telephone Encounter (Signed)
 While she was in the ER they checked to see if she was possibly having a heart attack which is appeared through the blood work was negative.  They ruled out a blood clot.  They think the pain was musculoskeletal related.  Wanted her to use the lidocaine patches to help with the pain.

## 2023-06-19 NOTE — Telephone Encounter (Signed)
 Called and spoke to patient. She states she went to the ER like we advised her to but wasn't told anything as to why she was having the pains. States she was told they would contact her but never did. Patient asking if Clydie Braun can look in her chart and see if she sees anything. Patient also states that she has an appointment with cardiology later this month.    Copied from CRM [id number]. Topic: Clinical - Lab/Test Results >> Jun 18, 2023  3:45 PM Carlatta H wrote: Reason for CRM: Please call patient back lab results from hospital//

## 2023-07-19 NOTE — Progress Notes (Signed)
 Dyspnea, viral illness of possible COVID/influenza

## 2023-07-23 ENCOUNTER — Other Ambulatory Visit: Payer: Self-pay

## 2023-07-26 ENCOUNTER — Telehealth: Payer: Self-pay | Admitting: Nurse Practitioner

## 2023-07-26 NOTE — Telephone Encounter (Signed)
 Copied from CRM [id number]. Topic: General - Other >> Jul 26, 2023  2:13 PM Tiffany S wrote: Reason for CRM: Patient needs portable oxygen tank for her trip patient is going out of town 4/24-4/29 Please follow up with patient

## 2023-07-26 NOTE — Telephone Encounter (Signed)
 Order for portable oxygen placed via [employer]. Order sent to providers email for signing

## 2023-07-29 NOTE — Telephone Encounter (Signed)
 Order has been signed.

## 2023-08-15 ENCOUNTER — Other Ambulatory Visit: Payer: Self-pay

## 2023-08-15 NOTE — Patient Instructions (Addendum)
 Thank you for allowing the Complex Care Management team to participate in your care.   Reminders: Please attend your PCP appointment as scheduled on Aug 23, 2023. Our next outreach will be via telephone on September 20, 2023 at 1 pm.  Please do not hesitate to contact me if you require assistance prior to our next outreach.    Roxie Cord [facility] Health Population Health RN Care Manager Direct Dial: [phone]  Fax: [phone] Website: [email or web address]

## 2023-08-15 NOTE — Patient Outreach (Signed)
 Complex Care Management   Visit Note  08/15/2023  Name:  Michelle Jenkins MRN: [id number] DOB: [birth year]  Situation: Referral received for Complex Care Management related to Heart Failure and COPD I obtained verbal consent from Patient.  Visit completed with Michelle Jenkins via telephone.  Background:   Past Medical History:  Diagnosis Date   Anginal pain (HCC)    Asthma    Bronchitis    CAD (coronary artery disease)    CHF (congestive heart failure) (HCC)    CVA (cerebral infarction)    Dysrhythmia    GERD (gastroesophageal reflux disease)    History of chronic atrial fibrillation    Hypercholesteremia    Hypertension    Idiopathic pulmonary hypertension (HCC)    Myocardial infarction (HCC)    Obesity    OSA (obstructive sleep apnea)    Pre-diabetes    Pulmonary artery hypertension (HCC)    Shortness of breath dyspnea    Sleep apnea    Stroke [facility])      Assessment: Patient Reported Symptoms: Cognitive Cognitive Status: Alert and oriented to person, place, and time, Normal speech and language skills Cognitive/Intellectual Conditions Management [RPT]: None reported or documented in medical history or problem list   Health Maintenance Behaviors: Annual physical exam Healing Pattern: Unsure Health Facilitated by: Pain control  Neurological Neurological Review of Symptoms: No symptoms reported Neurological Conditions:  (History of Stroke, Peripheral Neuropathy) Neurological Management Strategies: Coping strategies, Medication therapy, Routine screening, Medical device Neurological Self-Management Outcome: 4 (good)  HEENT HEENT Symptoms Reported:  (Lost vision in her left eye following stroke) HEENT Conditions: Vision problem(s) Vision Problems: blindness/vision loss (Blindness to left eye) HEENT Management Strategies: Coping strategies, Medical device, Routine screening HEENT Self-Management Outcome: 4 (good) Vision problem(s)  Cardiovascular Cardiovascular Symptoms  Reported: No symptoms reported Does patient have uncontrolled Hypertension?: No Cardiovascular Conditions: Coronary artery disease, Dysrhythmia, Heart failure, High blood cholesterol, Hypertension Cardiovascular Management Strategies: Medical device, Medication therapy, Routine screening, Diet modification Weight: 280 lb (127 kg) Cardiovascular Self-Management Outcome: 3 (uncertain)  Respiratory Respiratory Symptoms Reported: No symptoms reported Respiratory Conditions: COPD, Sleep disordered breathing Respiratory Self-Management Outcome: 4 (good)  Endocrine Patient reports the following symptoms related to hypoglycemia or hyperglycemia : No symptoms reported Is patient diabetic?: No Endocrine Management Strategies: Routine screening Endocrine Comment: No Symptoms Reported  Gastrointestinal Gastrointestinal Symptoms Reported: No symptoms reported, Obesity Gastrointestinal Conditions: Reflux/heartburn Gastrointestinal Management Strategies: Diet modification, Medication therapy Gastrointestinal Self-Management Outcome: 3 (uncertain) Nutrition Risk Screen (CP): No indicators present  Genitourinary Genitourinary Symptoms Reported: No symptoms reported Genitourinary Comment: No Symptoms Reported  Integumentary Integumentary Symptoms Reported: No symptoms reported Skin Management Strategies: Routine screening Skin Comment: No Symptoms Reported  Musculoskeletal Musculoskelatal Symptoms Reviewed: Unsteady gait Additional Musculoskeletal Details: Reports unsteady gait since being evaluated for a stroke. Currently using a walker with all ambulation. Musculoskeletal Conditions: Joint pain, Unsteady gait, Mobility limited (Reports chronic joint and neck pain) Musculoskeletal Management Strategies: Medical device, Medication therapy, Routine screening Musculoskeletal Self-Management Outcome: 3 (uncertain) Falls in the past year?: No Number of falls in past year: 1 or less Was there an injury with  Fall?: No Fall Risk Category Calculator: 0 Patient Fall Risk Level: Low Fall Risk Patient at Risk for Falls Due to: Impaired balance/gait, Impaired vision, Medication side effect Fall risk Follow up: Falls prevention discussed  Psychosocial Psychosocial Symptoms Reported: No symptoms reported Behavioral Health Comment: No Symptoms Reported Major Change/Loss/Stressor/Fears (CP): Denies Quality of Family Relationships: supportive Do you feel physically threatened by others?: No  08/15/2023   11:15 AM  Depression screen PHQ 2/9  Decreased Interest 0  Down, Depressed, Hopeless 0  PHQ - 2 Score 0    There were no vitals filed for this visit.  Medications Reviewed Today     Reviewed by Roxie Cord, RN (Registered Nurse) on 08/15/23 at 1133  Med List Status: <None>   Medication Order Taking? Sig Documenting Provider Last Dose Status Informant  albuterol  (VENTOLIN  HFA) 108 (90 Base) MCG/ACT inhaler [id number] Yes INHALE 1 TO 2 PUFFS INTO THE LUNGS EVERY 6 HOURS AS NEEDED Aileen Alexanders, NP Taking Active   ambrisentan  (LETAIRIS ) 10 MG tablet [id number] Yes Take 10 mg by mouth daily. [provider] Taking Active   apixaban  (ELIQUIS ) 5 MG TABS tablet [id number] Yes Take 1 tablet (5 mg total) by mouth 2 (two) times daily. Aileen Alexanders, NP Taking Active   atorvastatin  (LIPITOR) 20 MG tablet [id number] Yes Take 1 tablet daily Aileen Alexanders, NP Taking Active   empagliflozin  (JARDIANCE ) 10 MG TABS tablet [id number] Yes Take 1 tablet (10 mg total) by mouth daily. Aileen Alexanders, NP Taking Active   ezetimibe  (ZETIA ) 10 MG tablet [id number]  Take by mouth daily Aileen Alexanders, NP  Active   furosemide  (LASIX ) 40 MG tablet [id number]  Take 1 tablet (40 mg total) by mouth daily. Aileen Alexanders, NP  Expired 07/03/23 2359   gabapentin  (NEURONTIN ) 300 MG capsule [id number] Yes Take 2 capsules at bedtime. Aileen Alexanders, NP Taking Active   metoprolol  succinate (TOPROL -XL) 25  MG 24 hr tablet [id number]  Take 1 tablet (25 mg total) by mouth daily. Aileen Alexanders, NP  Expired 07/04/23 2359   olmesartan  (BENICAR ) 20 MG tablet [id number]  Take 1 tablet (20 mg total) by mouth daily. Aileen Alexanders, NP  Active   pantoprazole  (PROTONIX ) 40 MG tablet [id number] Yes TAKE 1 TABLET BY MOUTH EVERY DAY Aileen Alexanders, NP Taking Active   Psyllium (METAMUCIL FIBER PO) [id number]  Take 1 Dose by mouth as needed. Patient uses Metamucil Powder in the AM [provider]  Active             Recommendation:   PCP Follow-up Aug 23, 2023  Follow Up Plan:   Telephone follow-up with Nurse Case Manager on September 20, 2023 at 1 pm   Roxie Cord [facility] Health RN Care Manager Direct Dial: [phone]  Fax: [phone] Website: [email or web address]

## 2023-08-19 ENCOUNTER — Telehealth: Payer: Self-pay | Admitting: Nurse Practitioner

## 2023-08-19 NOTE — Telephone Encounter (Signed)
 Copied from CRM [id number]. Topic: Appointments - Scheduling Inquiry for Clinic >> Aug 15, 2023 11:10 AM Baldemar Lev wrote: Reason for CRM: Pt called to get connected with Roxie Cord, says she missed her appt for 11 am because Felecia called before 11 am. Aretta Beery a message on teams.

## 2023-08-23 ENCOUNTER — Ambulatory Visit (INDEPENDENT_AMBULATORY_CARE_PROVIDER_SITE_OTHER): Payer: 59 | Admitting: Nurse Practitioner

## 2023-08-23 ENCOUNTER — Encounter: Payer: Self-pay | Admitting: Nurse Practitioner

## 2023-08-23 VITALS — BP 95/63 | HR 74 | Temp 97.8°F | Ht 65.0 in | Wt 280.0 lb

## 2023-08-23 DIAGNOSIS — I10 Essential (primary) hypertension: Secondary | ICD-10-CM

## 2023-08-23 DIAGNOSIS — I4891 Unspecified atrial fibrillation: Secondary | ICD-10-CM

## 2023-08-23 DIAGNOSIS — N1831 Chronic kidney disease, stage 3a: Secondary | ICD-10-CM | POA: Diagnosis not present

## 2023-08-23 DIAGNOSIS — I270 Primary pulmonary hypertension: Secondary | ICD-10-CM | POA: Diagnosis not present

## 2023-08-23 DIAGNOSIS — M13861 Other specified arthritis, right knee: Secondary | ICD-10-CM | POA: Diagnosis not present

## 2023-08-23 DIAGNOSIS — M1711 Unilateral primary osteoarthritis, right knee: Secondary | ICD-10-CM | POA: Insufficient documentation

## 2023-08-23 DIAGNOSIS — R7301 Impaired fasting glucose: Secondary | ICD-10-CM

## 2023-08-23 DIAGNOSIS — E7800 Pure hypercholesterolemia, unspecified: Secondary | ICD-10-CM | POA: Diagnosis not present

## 2023-08-23 MED ORDER — LABETALOL HCL 200 MG PO TABS: 200.0000 mg | ORAL_TABLET | Freq: Two times a day (BID) | ORAL | Status: DC

## 2023-08-23 NOTE — Assessment & Plan Note (Signed)
See procedure note below.

## 2023-08-23 NOTE — Assessment & Plan Note (Signed)
 Chronic. Followed by Pulmonology.  Now on Letairis .  Note reviewed from Pulmonology.  Continue to follow up with specialist. On 3L O2.  Continue with current medication regimen.

## 2023-08-23 NOTE — Assessment & Plan Note (Signed)
 Chronic.  Controlled.  Continue with current medication regimen.  Continue with Eliquis .  Has not followed up with Cardiology since December 2023.  Labs ordered today.  Return to clinic in 6 months for reevaluation.  Call sooner if concerns arise.

## 2023-08-23 NOTE — Assessment & Plan Note (Signed)
Chronic.  Controlled.  Continue with current medication regimen.  Return to clinic in 3 months for reevaluation.  Call sooner if concerns arise.

## 2023-08-23 NOTE — Assessment & Plan Note (Signed)
Chronic.  Controlled.  Continue with current medication regimen of Atorvastatin 20mg  daily.  Labs ordered today.  Return to clinic in 6 months for reevaluation.  Call sooner if concerns arise.

## 2023-08-23 NOTE — Assessment & Plan Note (Signed)
 Labs ordered at visit today.  Will make recommendations based on lab results.

## 2023-08-23 NOTE — Progress Notes (Addendum)
 BP 95/63 (BP Location: Right Arm, Patient Position: Sitting, Cuff Size: Large)   Pulse 74   Temp 97.8 F (36.6 C) (Oral)   Ht 5' 5 (1.651 m)   Wt 280 lb (127 kg)   SpO2 98%   BMI 46.59 kg/m    Subjective:    Patient ID: Michelle Jenkins, female    DOB: May 29, 1945, 78 y.o.   MRN: [id number]  HPI: Michelle Jenkins is a 78 y.o. female  Chief Complaint  Patient presents with  . Hypertension  . Diabetes  . Medication Review     Pt has concerns that some of the medication that she is taking is not on her med list and one is on there that she is not taking. She would like to go over medications to make sure she is taking the correct meds.   . Knee Pain    Right knee. Would like knee injection today.   HYPERTENSION / HYPERLIPIDEMIA Satisfied with current treatment? yes Duration of hypertension: years BP monitoring frequency: not checking BP range:  BP medication side effects: no Past BP meds: olmesartan -HCTZ, Lasix  Duration of hyperlipidemia: years Cholesterol medication side effects: no Cholesterol supplements: none Past cholesterol medications: atorvastain (lipitor) Medication compliance: excellent compliance Aspirin: no Recent stressors: no Recurrent headaches: no Visual changes: no Palpitations: no Dyspnea: yes Chest pain: no Lower extremity edema: no Dizzy/lightheaded: no  COPD See's pulmonology on May 21.  Does have Pulmonary HTN.  COPD status: controlled Satisfied with current treatment?: yes Oxygen  use: yes (3L) Dyspnea frequency: SOB Cough frequency: Sometimes Rescue inhaler frequency:   Limitation of activity: yes Productive cough: yes Last Spirometry:  Pneumovax: Up to Date Influenza: Up to Date  KNEE PAIN Duration: months Involved knee: right Mechanism of injury: unknown Location:diffuse Onset: gradual Severity: 7/10  Quality:  throbbing Frequency: constant Radiation: no Aggravating factors: weight bearing and walking  Alleviating factors:  ice  Status: stable Treatments attempted: rest and ice  Relief with NSAIDs?:  no Weakness with weight bearing or walking: no Sensation of giving way: no Locking: no Popping: no Bruising: no Swelling: yes Redness: no Paresthesias/decreased sensation: no Fevers: no    Relevant past medical, surgical, family and social history reviewed and updated as indicated. Interim medical history since our last visit reviewed. Allergies and medications reviewed and updated.  Review of Systems  Eyes:  Negative for visual disturbance.  Respiratory:  Negative for cough, chest tightness and shortness of breath.   Cardiovascular:  Negative for chest pain, palpitations and leg swelling.  Musculoskeletal:        Right knee pain  Neurological:  Negative for dizziness and headaches.     Per HPI unless specifically indicated above     Objective:     BP 95/63 (BP Location: Right Arm, Patient Position: Sitting, Cuff Size: Large)   Pulse 74   Temp 97.8 F (36.6 C) (Oral)   Ht 5' 5 (1.651 m)   Wt 280 lb (127 kg)   SpO2 98%   BMI 46.59 kg/m   Wt Readings from Last 3 Encounters:  01/24/24 293 lb (132.9 kg)  01/10/24 287 lb (130.2 kg)  12/26/23 285 lb (129.3 kg)    Physical Exam Vitals and nursing note reviewed.  Constitutional:      General: She is not in acute distress.    Appearance: Normal appearance. She is normal weight. She is not ill-appearing, toxic-appearing or diaphoretic.  HENT:     Head: Normocephalic.  Right Ear: External ear normal.     Left Ear: External ear normal.     Nose: Nose normal.     Mouth/Throat:     Mouth: Mucous membranes are moist.     Pharynx: Oropharynx is clear.  Eyes:     General:        Right eye: No discharge.        Left eye: No discharge.     Extraocular Movements: Extraocular movements intact.     Conjunctiva/sclera: Conjunctivae normal.     Pupils: Pupils are equal, round, and reactive to light.  Cardiovascular:     Rate and Rhythm:  Normal rate and regular rhythm.     Heart sounds: No murmur heard. Pulmonary:     Effort: Pulmonary effort is normal. No respiratory distress.     Breath sounds: Normal breath sounds. No wheezing or rales.  Musculoskeletal:        General: Swelling and tenderness present.     Cervical back: Normal range of motion and neck supple.  Skin:    General: Skin is warm and dry.     Capillary Refill: Capillary refill takes less than 2 seconds.  Neurological:     General: No focal deficit present.     Mental Status: She is alert and oriented to person, place, and time. Mental status is at baseline.  Psychiatric:        Mood and Affect: Mood normal.        Behavior: Behavior normal.        Thought Content: Thought content normal.        Judgment: Judgment normal.     Results for orders placed or performed in visit on 08/23/23  Comprehensive metabolic panel with GFR   Collection Time: 08/23/23 10:44 AM  Result Value Ref Range   Glucose 96 70 - 99 mg/dL   BUN 41 (H) 8 - 27 mg/dL   Creatinine, Ser 8.44 (H) 0.57 - 1.00 mg/dL   eGFR 34 (L) >40 fO/fpw/8.26   BUN/Creatinine Ratio 26 12 - 28   Sodium 140 134 - 144 mmol/L   Potassium 4.5 3.5 - 5.2 mmol/L   Chloride 101 96 - 106 mmol/L   CO2 25 20 - 29 mmol/L   Calcium  9.3 8.7 - 10.3 mg/dL   Total Protein 6.7 6.0 - 8.5 g/dL   Albumin 4.3 3.8 - 4.8 g/dL   Globulin, Total 2.4 1.5 - 4.5 g/dL   Bilirubin Total 0.6 0.0 - 1.2 mg/dL   Alkaline Phosphatase 84 44 - 121 IU/L   AST 20 0 - 40 IU/L   ALT 10 0 - 32 IU/L  Hemoglobin A1c   Collection Time: 08/23/23 10:44 AM  Result Value Ref Range   Hgb A1c MFr Bld 6.4 (H) 4.8 - 5.6 %   Est. average glucose Bld gHb Est-mCnc 137 mg/dL  Lipid panel   Collection Time: 08/23/23 10:44 AM  Result Value Ref Range   Cholesterol, Total 165 100 - 199 mg/dL   Triglycerides 36 0 - 149 mg/dL   HDL 90 >60 mg/dL   VLDL Cholesterol Cal 8 5 - 40 mg/dL   LDL Chol Calc (NIH) 67 0 - 99 mg/dL   Chol/HDL Ratio 1.8 0.0  - 4.4 ratio      Assessment & Plan:   Problem List Items Addressed This Visit       Cardiovascular and Mediastinum   Primary pulmonary hypertension (HCC) - Primary   Chronic. Followed by Pulmonology.  Now on Letairis .  Note reviewed from Pulmonology.  Continue to follow up with specialist. On 3L O2.  Continue with current medication regimen.       Relevant Medications   labetalol  (NORMODYNE ) 200 MG tablet   Other Relevant Orders   Comprehensive metabolic panel with GFR (Completed)   Hypertension   Chronic.  Controlled.  Continue with current medication regimen.   Return to clinic in 3 months for reevaluation.  Call sooner if concerns arise.       Relevant Medications   labetalol  (NORMODYNE ) 200 MG tablet   Other Relevant Orders   Comprehensive metabolic panel with GFR (Completed)   Atrial fibrillation (HCC)   Chronic.  Controlled.  Continue with current medication regimen.  Continue with Eliquis .  Has not followed up with Cardiology since December 2023.  Labs ordered today.  Return to clinic in 6 months for reevaluation.  Call sooner if concerns arise.       Relevant Medications   labetalol  (NORMODYNE ) 200 MG tablet     Endocrine   IFG (impaired fasting glucose)   Labs ordered at visit today.  Will make recommendations based on lab results.        Relevant Orders   Hemoglobin A1c (Completed)     Musculoskeletal and Integument   Arthritis of right knee   See procedure note below.        Genitourinary   CKD stage 3a, GFR 45-59 ml/min (HCC)   Chronic.  Labs ordered.  Continue to keep blood pressure well controlled.  Continue with ACE.  Labs ordered today.  Follow up in 3 months.  Calls sooner if concerns arise.         Other   Hypercholesteremia   Chronic.  Controlled.  Continue with current medication regimen of Atorvastatin  20mg  daily.   Labs ordered today.  Return to clinic in 6 months for reevaluation.  Call sooner if concerns arise.       Relevant  Medications   labetalol  (NORMODYNE ) 200 MG tablet   Other Relevant Orders   Lipid panel (Completed)    Procedure: Right  Knee Intraarticular Steroid Injection        Diagnosis:   ICD-10-CM   1. Primary pulmonary hypertension (HCC)  I27.0 Comprehensive metabolic panel with GFR    2. Atrial fibrillation, unspecified type (HCC)  I48.91     3. CKD stage 3a, GFR 45-59 ml/min (HCC)  N18.31     4. Hypercholesteremia  E78.00 Lipid panel    5. Primary hypertension  I10 Comprehensive metabolic panel with GFR    6. IFG (impaired fasting glucose)  R73.01 Hemoglobin A1c    7. Arthritis of right knee  M17.11       Provider: Darice Petty, NP Consent:  Risks, benefits, and alternative treatments discussed and all questions were answered.  Patient elected to proceed and verbal consent obtained.  Description: Area prepped and draped using  semi-sterile technique.  Using a anterior/lateral approach, a mixture of 4 cc of  1% lidocaine  & 1 cc of Kenalog  40 was injected into knee joint.  A bandage was then placed over the injection site. Complications: none Post Procedure Instructions: Wound care instructions discussed and patient was instructed to keep area clean and dry.  Signs and symptoms of infection discussed, patient agrees to contact the office ASAP should they occur.  Follow Up: Return in about 3 months (around 11/23/2023) for HTN, HLD, DM2 FU.  Follow up plan: Return in about 3 months (around  11/23/2023) for HTN, HLD, DM2 FU.

## 2023-08-23 NOTE — Assessment & Plan Note (Signed)
 Chronic.  Labs ordered.  Continue to keep blood pressure well controlled.  Continue with ACE.  Labs ordered today.  Follow up in 3 months.  Calls sooner if concerns arise.

## 2023-08-24 LAB — LIPID PANEL
Chol/HDL Ratio: 1.8 ratio (ref 0.0–4.4)
Cholesterol, Total: 165 mg/dL (ref 100–199)
HDL: 90 mg/dL (ref >39)
LDL Chol Calc (NIH): 67 mg/dL (ref 0–99)
Triglycerides: 36 mg/dL (ref 0–149)
VLDL Cholesterol Cal: 8 mg/dL (ref 5–40)

## 2023-08-24 LAB — COMPREHENSIVE METABOLIC PANEL WITH GFR
ALT: 10 IU/L (ref 0–32)
AST: 20 IU/L (ref 0–40)
Albumin: 4.3 g/dL (ref 3.8–4.8)
Alkaline Phosphatase: 84 IU/L (ref 44–121)
BUN/Creatinine Ratio: 26 (ref 12–28)
BUN: 41 mg/dL — ABNORMAL HIGH (ref 8–27)
Bilirubin Total: 0.6 mg/dL (ref 0.0–1.2)
CO2: 25 mmol/L (ref 20–29)
Calcium: 9.3 mg/dL (ref 8.7–10.3)
Chloride: 101 mmol/L (ref 96–106)
Creatinine, Ser: 1.55 mg/dL — ABNORMAL HIGH (ref 0.57–1.00)
Globulin, Total: 2.4 g/dL (ref 1.5–4.5)
Glucose: 96 mg/dL (ref 70–99)
Potassium: 4.5 mmol/L (ref 3.5–5.2)
Sodium: 140 mmol/L (ref 134–144)
Total Protein: 6.7 g/dL (ref 6.0–8.5)
eGFR: 34 mL/min/{1.73_m2} — ABNORMAL LOW (ref >59)

## 2023-08-24 LAB — HEMOGLOBIN A1C
Est. average glucose Bld gHb Est-mCnc: 137 mg/dL
Hgb A1c MFr Bld: 6.4 % — ABNORMAL HIGH (ref 4.8–5.6)

## 2023-08-26 ENCOUNTER — Encounter: Payer: Self-pay | Admitting: Nurse Practitioner

## 2023-08-27 ENCOUNTER — Ambulatory Visit: Payer: Self-pay

## 2023-09-02 ENCOUNTER — Ambulatory Visit: Payer: Self-pay | Admitting: Nurse Practitioner

## 2023-09-04 ENCOUNTER — Ambulatory Visit (INDEPENDENT_AMBULATORY_CARE_PROVIDER_SITE_OTHER): Admitting: Nurse Practitioner

## 2023-09-04 ENCOUNTER — Encounter: Payer: Self-pay | Admitting: Nurse Practitioner

## 2023-09-04 VITALS — BP 106/68 | HR 97 | Temp 99.1°F | Resp 17 | Ht 65.0 in | Wt 282.0 lb

## 2023-09-04 DIAGNOSIS — M62838 Other muscle spasm: Secondary | ICD-10-CM

## 2023-09-04 MED ORDER — CYCLOBENZAPRINE HCL 10 MG PO TABS: 10.0000 mg | ORAL_TABLET | Freq: Three times a day (TID) | ORAL | 0 refills | Status: AC | PRN

## 2023-09-04 MED ORDER — LIDOCAINE 4 % EX PTCH: 1.0000 | MEDICATED_PATCH | CUTANEOUS | 1 refills | Status: AC

## 2023-09-04 NOTE — Progress Notes (Signed)
 BP 106/68 (BP Location: Right Arm, Patient Position: Sitting, Cuff Size: Large)   Pulse 97   Temp 99.1 F (37.3 C) (Oral)   Resp 17   Ht 5\' 5"  (1.651 m)   Wt 282 lb (127.9 kg)   SpO2 97%   BMI 46.93 kg/m    Subjective:    Patient ID: Michelle Jenkins, female    DOB: June 07, 1945, 78 y.o.   MRN: [id number]  HPI: Michelle Jenkins is a 78 y.o. female  Chief Complaint  Patient presents with   Neck Pain    Started about 8 days ago.    Vitamin D level    Daughter would like her to be checked.    NECK PAIN  Started about 8 days ago Status: worse Treatments attempted: none  Compliant with recommended treatment: yes Relief with NSAIDs?:  No NSAIDs Taken Location:Right Duration:days Severity: 10/10 Quality: sharp Frequency: constant Radiation: none Aggravating factors: movement Alleviating factors: nothing Weakness:  no Paresthesias / decreased sensation:  no  Fevers:  no     Relevant past medical, surgical, family and social history reviewed and updated as indicated. Interim medical history since our last visit reviewed. Allergies and medications reviewed and updated.  Review of Systems  Musculoskeletal:  Positive for neck pain and neck stiffness.    Per HPI unless specifically indicated above     Objective:     BP 106/68 (BP Location: Right Arm, Patient Position: Sitting, Cuff Size: Large)   Pulse 97   Temp 99.1 F (37.3 C) (Oral)   Resp 17   Ht 5\' 5"  (1.651 m)   Wt 282 lb (127.9 kg)   SpO2 97%   BMI 46.93 kg/m   Wt Readings from Last 3 Encounters:  09/04/23 282 lb (127.9 kg)  08/23/23 280 lb (127 kg)  08/15/23 280 lb (127 kg)    Physical Exam Vitals and nursing note reviewed.  Constitutional:      General: She is not in acute distress.    Appearance: Normal appearance. She is normal weight. She is not ill-appearing, toxic-appearing or diaphoretic.  HENT:     Head: Normocephalic.     Right Ear: External ear normal.     Left Ear: External ear  normal.     Nose: Nose normal.     Mouth/Throat:     Mouth: Mucous membranes are moist.     Pharynx: Oropharynx is clear.  Eyes:     General:        Right eye: No discharge.        Left eye: No discharge.     Extraocular Movements: Extraocular movements intact.     Conjunctiva/sclera: Conjunctivae normal.     Pupils: Pupils are equal, round, and reactive to light.  Cardiovascular:     Rate and Rhythm: Normal rate and regular rhythm.     Heart sounds: No murmur heard. Pulmonary:     Effort: Pulmonary effort is normal. No respiratory distress.     Breath sounds: Normal breath sounds. No wheezing or rales.  Musculoskeletal:     Cervical back: Neck supple. Torticollis present. Pain with movement and muscular tenderness present. Decreased range of motion.  Skin:    General: Skin is warm and dry.     Capillary Refill: Capillary refill takes less than 2 seconds.  Neurological:     General: No focal deficit present.     Mental Status: She is alert and oriented to person, place, and time. Mental status is  at baseline.  Psychiatric:        Mood and Affect: Mood normal.        Behavior: Behavior normal.        Thought Content: Thought content normal.        Judgment: Judgment normal.     Results for orders placed or performed in visit on 08/23/23  Comprehensive metabolic panel with GFR   Collection Time: 08/23/23 10:44 AM  Result Value Ref Range   Glucose 96 70 - 99 mg/dL   BUN 41 (H) 8 - 27 mg/dL   Creatinine, Ser 1.61 (H) 0.57 - 1.00 mg/dL   eGFR 34 (L) >09 UE/AVW/0.98   BUN/Creatinine Ratio 26 12 - 28   Sodium 140 134 - 144 mmol/L   Potassium 4.5 3.5 - 5.2 mmol/L   Chloride 101 96 - 106 mmol/L   CO2 25 20 - 29 mmol/L   Calcium  9.3 8.7 - 10.3 mg/dL   Total Protein 6.7 6.0 - 8.5 g/dL   Albumin 4.3 3.8 - 4.8 g/dL   Globulin, Total 2.4 1.5 - 4.5 g/dL   Bilirubin Total 0.6 0.0 - 1.2 mg/dL   Alkaline Phosphatase 84 44 - 121 IU/L   AST 20 0 - 40 IU/L   ALT 10 0 - 32 IU/L   Hemoglobin A1c   Collection Time: 08/23/23 10:44 AM  Result Value Ref Range   Hgb A1c MFr Bld 6.4 (H) 4.8 - 5.6 %   Est. average glucose Bld gHb Est-mCnc 137 mg/dL  Lipid panel   Collection Time: 08/23/23 10:44 AM  Result Value Ref Range   Cholesterol, Total 165 100 - 199 mg/dL   Triglycerides 36 0 - 149 mg/dL   HDL 90 >11 mg/dL   VLDL Cholesterol Cal 8 5 - 40 mg/dL   LDL Chol Calc (NIH) 67 0 - 99 mg/dL   Chol/HDL Ratio 1.8 0.0 - 4.4 ratio      Assessment & Plan:   Problem List Items Addressed This Visit   None Visit Diagnoses       Muscle spasm    -  Primary   Will treat with cyclobenzaprine and lidocaine  patches.   Recommend using a heating pad to help with symptoms.  FU if not improved.        Follow up plan: No follow-ups on file.

## 2023-09-20 ENCOUNTER — Other Ambulatory Visit: Payer: Self-pay

## 2023-09-20 NOTE — Patient Outreach (Signed)
 Complex Care Management   Visit Note  09/20/2023  Name:  Michelle Jenkins MRN: [id number] DOB: September 30, 1945  Situation: Referral received for Complex Care Management related to Heart Failure and COPD I obtained verbal consent from Patient.  Visit completed with Michelle Jenkins via telephone.  Background:   Past Medical History:  Diagnosis Date   Anginal pain (HCC)    Asthma    Bronchitis    CAD (coronary artery disease)    CHF (congestive heart failure) (HCC)    CVA (cerebral infarction)    Dysrhythmia    GERD (gastroesophageal reflux disease)    History of chronic atrial fibrillation    Hypercholesteremia    Hypertension    Idiopathic pulmonary hypertension (HCC)    Myocardial infarction (HCC)    Obesity    OSA (obstructive sleep apnea)    Pre-diabetes    Pulmonary artery hypertension (HCC)    Shortness of breath dyspnea    Sleep apnea    Stroke [facility])     Assessment: Patient Reported Symptoms: Cognitive Cognitive Status: Alert and oriented to person, place, and time, Normal speech and language skills Cognitive/Intellectual Conditions Management [RPT]: None reported or documented in medical history or problem list Health Maintenance Behaviors: Annual physical exam Healing Pattern: Unsure Health Facilitated by: Pain control  Neurological Neurological Review of Symptoms: No symptoms reported  HEENT HEENT Symptoms Reported: Other: (Loss of vision in left eye due to stroke)  Cardiovascular Cardiovascular Symptoms Reported: No symptoms reported Weight: 280 lb (127 kg)  Respiratory Respiratory Symptoms Reported: No symptoms reported  Endocrine Patient reports the following symptoms related to hypoglycemia or hyperglycemia : No symptoms reported  Gastrointestinal Gastrointestinal Symptoms Reported: No symptoms reported, Obesity Additional Gastrointestinal Details: Obesity. Reports attempting to manage portions and monitor intake.  Genitourinary Genitourinary Symptoms Reported: No  symptoms reported  Integumentary Integumentary Symptoms Reported: No symptoms reported  Musculoskeletal Musculoskelatal Symptoms Reviewed: Unsteady gait Additional Musculoskeletal Details: Reports unsteady gait due to stroke. Currently using walker with ambulation  Psychosocial Psychosocial Symptoms Reported: No symptoms reported Quality of Family Relationships: supportive Do you feel physically threatened by others?: No      09/20/2023    1:25 PM  Depression screen PHQ 2/9  Decreased Interest 0  Down, Depressed, Hopeless 0  PHQ - 2 Score 0    There were no vitals filed for this visit.  Medications Reviewed Today     Reviewed by Roxie Cord, RN (Registered Nurse) on 09/20/23 at 1310  Med List Status: <None>   Medication Order Taking? Sig Documenting Provider Last Dose Status Informant  albuterol  (VENTOLIN  HFA) 108 (90 Base) MCG/ACT inhaler [id number] No INHALE 1 TO 2 PUFFS INTO THE LUNGS EVERY 6 HOURS AS NEEDED Aileen Alexanders, NP Taking Active   ambrisentan  (LETAIRIS ) 10 MG tablet [id number] No Take 10 mg by mouth daily. [provider] Taking Active   apixaban  (ELIQUIS ) 5 MG TABS tablet [id number] No Take 1 tablet (5 mg total) by mouth 2 (two) times daily. Aileen Alexanders, NP Taking Active   atorvastatin  (LIPITOR) 20 MG tablet [id number] No Take 1 tablet daily Aileen Alexanders, NP Taking Active   cyclobenzaprine  (FLEXERIL ) 10 MG tablet [id number]  Take 1 tablet (10 mg total) by mouth 3 (three) times daily as needed for muscle spasms. Aileen Alexanders, NP  Active   empagliflozin  (JARDIANCE ) 10 MG TABS tablet [id number] No Take 1 tablet (10 mg total) by mouth daily. Aileen Alexanders, NP Taking Active   ezetimibe  (ZETIA ) 10 MG tablet [id number] No Take  by mouth daily Aileen Alexanders, NP Taking Active   furosemide  (LASIX ) 40 MG tablet [id number] No Take 1 tablet (40 mg total) by mouth daily. Aileen Alexanders, NP Taking Expired 08/23/23 2359   gabapentin  (NEURONTIN ) 300 MG  capsule [id number] No Take 2 capsules at bedtime. Aileen Alexanders, NP Taking Active   labetalol  (NORMODYNE ) 200 MG tablet [id number] No Take 1 tablet (200 mg total) by mouth 2 (two) times daily. Aileen Alexanders, NP Taking Active   lidocaine  (HM LIDOCAINE  PATCH) 4 % [id number]  Place 1 patch onto the skin daily. Aileen Alexanders, NP  Active   olmesartan  (BENICAR ) 20 MG tablet [id number] No Take 1 tablet (20 mg total) by mouth daily. Aileen Alexanders, NP Taking Active   OXYGEN  [id number] No Inhale into the lungs daily. 3mL continuously daily. [provider] Taking Active   pantoprazole  (PROTONIX ) 40 MG tablet [id number] No TAKE 1 TABLET BY MOUTH EVERY DAY Aileen Alexanders, NP Taking Active   Psyllium (METAMUCIL FIBER PO) [id number] No Take 1 Dose by mouth as needed. Patient uses Metamucil Powder in the AM [provider] Taking Active             Recommendation:   PCP Follow-up on December 02, 2023 Continue Current Plan of Care  Follow Up Plan:   Telephone follow up appointment with Nurse Case Manager on October 28, 2023    Roxie Cord [facility] RN Care Manager Direct Dial: [phone]  Fax: [phone] Website: [email or web address]

## 2023-09-21 NOTE — Patient Instructions (Addendum)
 Thank you for allowing the Complex Care Management team to participate in your care. It was great speaking with you!  We will follow up via telephone on October 28, 2023 at 2 pm. Please feel free to contact me if you require assistance prior to our next outreach.    Roxie Cord [facility] RN Care Manager Direct Dial: [phone]  Fax: [phone] Website: [email or web address]

## 2023-10-02 ENCOUNTER — Other Ambulatory Visit: Payer: Self-pay | Admitting: Nurse Practitioner

## 2023-10-03 ENCOUNTER — Other Ambulatory Visit: Payer: Self-pay | Admitting: Nurse Practitioner

## 2023-10-03 NOTE — Telephone Encounter (Unsigned)
 Copied from CRM [id number]. Topic: Clinical - Medication Refill >> Oct 03, 2023  8:58 AM Janeecia G wrote: Medication: apixaben  Has the patient contacted their pharmacy? Yes (Agent: If no, request that the patient contact the pharmacy for the refill. If patient does not wish to contact the pharmacy document the reason why and proceed with request.) (Agent: If yes, when and what did the pharmacy advise?)  This is the patient's preferred pharmacy:  [id number] [place],  - [address] AT [facility] [address] [place] [address] [place] [address] Phone: [phone] Fax: [phone]   Is this the correct pharmacy for this prescription? Yes If no, delete pharmacy and type the correct one.   Has the prescription been filled recently? No  Is the patient out of the medication? Yes  Has the patient been seen for an appointment in the last year OR does the patient have an upcoming appointment? Yes  Can we respond through MyChart? No  Agent: Please be advised that Rx refills may take up to 3 business days. We ask that you follow-up with your pharmacy.

## 2023-10-04 NOTE — Telephone Encounter (Signed)
 Requested Prescriptions  Pending Prescriptions Disp Refills   ELIQUIS  5 MG TABS tablet [Pharmacy Med Name: ELIQUIS  5MG  TABLETS] 180 tablet 1    Sig: TAKE 1 TABLET(5 MG) BY MOUTH TWICE DAILY     Hematology:  Anticoagulants - apixaban  Failed - 10/04/2023  3:16 PM      Failed - Cr in normal range and within 360 days    Creatinine, Ser  Date Value Ref Range Status  08/23/2023 1.55 (H) 0.57 - 1.00 mg/dL Final         Passed - PLT in normal range and within 360 days    Platelets  Date Value Ref Range Status  06/17/2023 170 150 - 400 K/uL Final  03/04/2023 146 (L) 150 - 450 x10E3/uL Final         Passed - HGB in normal range and within 360 days    Hemoglobin  Date Value Ref Range Status  06/17/2023 12.2 12.0 - 15.0 g/dL Final  66/44/0347 42.5 11.1 - 15.9 g/dL Final         Passed - HCT in normal range and within 360 days    HCT  Date Value Ref Range Status  06/17/2023 36.9 36.0 - 46.0 % Final   Hematocrit  Date Value Ref Range Status  03/04/2023 34.4 34.0 - 46.6 % Final         Passed - AST in normal range and within 360 days    AST  Date Value Ref Range Status  08/23/2023 20 0 - 40 IU/L Final         Passed - ALT in normal range and within 360 days    ALT  Date Value Ref Range Status  08/23/2023 10 0 - 32 IU/L Final         Passed - Valid encounter within last 12 months    Recent Outpatient Visits           1 month ago Muscle spasm   [place] [facility] Aileen Alexanders, NP   1 month ago Primary pulmonary hypertension [facility])   [place] [facility] Aileen Alexanders, NP   4 months ago Primary pulmonary hypertension [facility])   [place] [facility] Aileen Alexanders, NP

## 2023-10-07 ENCOUNTER — Other Ambulatory Visit: Payer: Self-pay | Admitting: Nurse Practitioner

## 2023-10-07 NOTE — Telephone Encounter (Unsigned)
 Copied from CRM [id number]. Topic: Clinical - Medication Refill >> Oct 07, 2023 11:06 AM Carlatta H wrote: Medication: atorvastatin  (LIPITOR) 20 MG tablet [[id number]]  Has the patient contacted their pharmacy? Yes (Agent: If no, request that the patient contact the pharmacy for the refill. If patient does not wish to contact the pharmacy document the reason why and proceed with request.) (Agent: If yes, when and what did the pharmacy advise?)  This is the patient's preferred pharmacy:  [facility] #[id number] [place], [place] - [address] AT [facility] OF [address] [place] [address] [place] [address] Phone: [phone] Fax: [phone]    Is this the correct pharmacy for this prescription? Yes If no, delete pharmacy and type the correct one.   Has the prescription been filled recently? No  Is the patient out of the medication? Yes  Has the patient been seen for an appointment in the last year OR does the patient have an upcoming appointment? Yes  Can we respond through MyChart? No  Agent: Please be advised that Rx refills may take up to 3 business days. We ask that you follow-up with your pharmacy.

## 2023-10-07 NOTE — Telephone Encounter (Signed)
 Requested Prescriptions  Pending Prescriptions Disp Refills   atorvastatin  (LIPITOR) 20 MG tablet [Pharmacy Med Name: ATORVASTATIN  20MG  TABLETS] 100 tablet 2    Sig: TAKE 1 TABLET(20 MG) BY MOUTH DAILY     Cardiovascular:  Antilipid - Statins Failed - 10/07/2023  2:46 PM      Failed - Lipid Panel in normal range within the last 12 months    Cholesterol, Total  Date Value Ref Range Status  08/23/2023 165 100 - 199 mg/dL Final   Cholesterol Piccolo, Waived  Date Value Ref Range Status  09/05/2016 154 <200 mg/dL Final    Comment:                            Desirable                <200                         Borderline High      200- 239                         High                     >239    LDL Chol Calc (NIH)  Date Value Ref Range Status  08/23/2023 67 0 - 99 mg/dL Final   HDL  Date Value Ref Range Status  08/23/2023 90 >39 mg/dL Final   Triglycerides  Date Value Ref Range Status  08/23/2023 36 0 - 149 mg/dL Final   Triglycerides Piccolo,Waived  Date Value Ref Range Status  09/05/2016 46 <150 mg/dL Final    Comment:                            Normal                   <150                         Borderline High     150 - 199                         High                200 - 499                         Very High                >499          Passed - Patient is not pregnant      Passed - Valid encounter within last 12 months    Recent Outpatient Visits           1 month ago Muscle spasm   [place] [facility] Melvin Pao, NP   1 month ago Primary pulmonary hypertension [facility])   [place] [facility] Melvin Pao, NP   4 months ago Primary pulmonary hypertension [facility])   [place] [facility] Melvin Pao, NP

## 2023-10-08 NOTE — Telephone Encounter (Signed)
 Requested Prescriptions  Refused Prescriptions Disp Refills   atorvastatin  (LIPITOR) 20 MG tablet 90 tablet 1    Sig: Take 1 tablet daily     Cardiovascular:  Antilipid - Statins Failed - 10/08/2023  5:53 PM      Failed - Lipid Panel in normal range within the last 12 months    Cholesterol, Total  Date Value Ref Range Status  08/23/2023 165 100 - 199 mg/dL Final   Cholesterol Piccolo, Waived  Date Value Ref Range Status  09/05/2016 154 <200 mg/dL Final    Comment:                            Desirable                <200                         Borderline High      200- 239                         High                     >239    LDL Chol Calc (NIH)  Date Value Ref Range Status  08/23/2023 67 0 - 99 mg/dL Final   HDL  Date Value Ref Range Status  08/23/2023 90 >39 mg/dL Final   Triglycerides  Date Value Ref Range Status  08/23/2023 36 0 - 149 mg/dL Final   Triglycerides Piccolo,Waived  Date Value Ref Range Status  09/05/2016 46 <150 mg/dL Final    Comment:                            Normal                   <150                         Borderline High     150 - 199                         High                200 - 499                         Very High                >499          Passed - Patient is not pregnant      Passed - Valid encounter within last 12 months    Recent Outpatient Visits           1 month ago Muscle spasm   [place] [facility] Melvin Pao, NP   1 month ago Primary pulmonary hypertension [facility])   [place] [facility] Melvin Pao, NP   4 months ago Primary pulmonary hypertension [facility])   [place] [facility] Melvin Pao, NP

## 2023-10-21 ENCOUNTER — Other Ambulatory Visit: Payer: Self-pay | Admitting: Nurse Practitioner

## 2023-10-23 NOTE — Telephone Encounter (Signed)
 Requested medication (s) are due for refill today: Yes  Requested medication (s) are on the active medication list: Yes  Last refill:     Future visit scheduled: Yes  Notes to clinic:  Prescription has expired.    Requested Prescriptions  Pending Prescriptions Disp Refills   furosemide  (LASIX ) 40 MG tablet [Pharmacy Med Name: FUROSEMIDE  40MG  TABLETS] 90 tablet 1    Sig: TAKE 1 TABLET(40 MG) BY MOUTH DAILY     Cardiovascular:  Diuretics - Loop Failed - 10/23/2023 11:20 AM      Failed - Cr in normal range and within 180 days    Creatinine, Ser  Date Value Ref Range Status  08/23/2023 1.55 (H) 0.57 - 1.00 mg/dL Final         Failed - Mg Level in normal range and within 180 days    Magnesium  Date Value Ref Range Status  03/26/2023 2.3 1.7 - 2.4 mg/dL Final    Comment:    Performed at [facility], [address] [place], [place] [address]         Passed - K in normal range and within 180 days    Potassium  Date Value Ref Range Status  08/23/2023 4.5 3.5 - 5.2 mmol/L Final         Passed - Ca in normal range and within 180 days    Calcium   Date Value Ref Range Status  08/23/2023 9.3 8.7 - 10.3 mg/dL Final         Passed - Na in normal range and within 180 days    Sodium  Date Value Ref Range Status  08/23/2023 140 134 - 144 mmol/L Final         Passed - Cl in normal range and within 180 days    Chloride  Date Value Ref Range Status  08/23/2023 101 96 - 106 mmol/L Final         Passed - Last BP in normal range    BP Readings from Last 1 Encounters:  09/04/23 106/68         Passed - Valid encounter within last 6 months    Recent Outpatient Visits           1 month ago Muscle spasm   [place] [facility] Melvin Pao, NP   2 months ago Primary pulmonary hypertension (HCC)   [facility], NP   5 months ago Primary pulmonary hypertension (HCC)   [place] [facility]  Melvin Pao, NP               JARDIANCE  10 MG TABS tablet [Pharmacy Med Name: JARDIANCE  10MG  TABLETS] 90 tablet 1    Sig: TAKE 1 TABLET(10 MG) BY MOUTH DAILY     Endocrinology:  Diabetes - SGLT2 Inhibitors Failed - 10/23/2023 11:20 AM      Failed - Cr in normal range and within 360 days    Creatinine, Ser  Date Value Ref Range Status  08/23/2023 1.55 (H) 0.57 - 1.00 mg/dL Final         Failed - eGFR in normal range and within 360 days    GFR calc Af Amer  Date Value Ref Range Status  01/28/2020 53 (L) >59 mL/min/1.73 Final    Comment:    **In accordance with recommendations from the NKF-ASN Task force,**   [facility] is in the process of updating its eGFR calculation to the   2021 CKD-EPI creatinine  equation that estimates kidney function   without a race variable.    GFR, Estimated  Date Value Ref Range Status  06/17/2023 41 (L) >60 mL/min Final    Comment:    (NOTE) Calculated using the CKD-EPI Creatinine Equation (2021)    eGFR  Date Value Ref Range Status  08/23/2023 34 (L) >59 mL/min/1.73 Final         Passed - HBA1C is between 0 and 7.9 and within 180 days    Hgb A1c MFr Bld  Date Value Ref Range Status  08/23/2023 6.4 (H) 4.8 - 5.6 % Final    Comment:             Prediabetes: 5.7 - 6.4          Diabetes: >6.4          Glycemic control for adults with diabetes: <7.0          Passed - Valid encounter within last 6 months    Recent Outpatient Visits           1 month ago Muscle spasm   [place] [facility] Melvin Pao, NP   2 months ago Primary pulmonary hypertension [facility])   [place] [facility] Mel[facility] Pa[facility], NP   5 months ago Primary pulmonary hypertension [facility])    [facility] Melvin Pao, NP

## 2023-10-28 ENCOUNTER — Other Ambulatory Visit: Payer: Self-pay

## 2023-10-28 NOTE — Patient Outreach (Unsigned)
 Complex Care Management   Visit Note  10/28/2023  Name:  Michelle Jenkins MRN: [id number] DOB: Nov 02, 1945  Situation: Referral received for Complex Care Management related to {Criteria:32550} I obtained verbal consent from {CHL AMB Patient/Caregiver:28184}.  Visit completed with ***  {VISIT LOCATION:32553}  Background:   Past Medical History:  Diagnosis Date   Anginal pain (HCC)    Asthma    Bronchitis    CAD (coronary artery disease)    CHF (congestive heart failure) (HCC)    CVA (cerebral infarction)    Dysrhythmia    GERD (gastroesophageal reflux disease)    History of chronic atrial fibrillation    Hypercholesteremia    Hypertension    Idiopathic pulmonary hypertension (HCC)    Myocardial infarction (HCC)    Obesity    OSA (obstructive sleep apnea)    Pre-diabetes    Pulmonary artery hypertension (HCC)    Shortness of breath dyspnea    Sleep apnea    Stroke [facility])     Assessment: Patient Reported Symptoms:  Cognitive        Neurological      HEENT        Cardiovascular   Weight: 291 lb (132 kg)  Respiratory      Endocrine      Gastrointestinal        Genitourinary      Integumentary      Musculoskeletal          Psychosocial       Quality of Family Relationships: supportive, helpful, involved Do you feel physically threatened by others?: No      09/20/2023    1:25 PM  Depression screen PHQ 2/9  Decreased Interest 0  Down, Depressed, Hopeless 0  PHQ - 2 Score 0    There were no vitals filed for this visit.  Medications Reviewed Today     Reviewed by Karoline Lima, RN (Registered Nurse) on 10/28/23 at 1405  Med List Status: <None>   Medication Order Taking? Sig Documenting Provider Last Dose Status Informant  albuterol  (VENTOLIN  HFA) 108 (90 Base) MCG/ACT inhaler [id number] No INHALE 1 TO 2 PUFFS INTO THE LUNGS EVERY 6 HOURS AS NEEDED Melvin Pao, NP Taking Active   ambrisentan  (LETAIRIS ) 10 MG tablet [id number] No Take 10 mg by  mouth daily. [provider] Taking Active   atorvastatin  (LIPITOR) 20 MG tablet [id number]  TAKE 1 TABLET(20 MG) BY MOUTH DAILY Melvin Pao, NP  Active   cyclobenzaprine  (FLEXERIL ) 10 MG tablet [id number]  Take 1 tablet (10 mg total) by mouth 3 (three) times daily as needed for muscle spasms. Melvin Pao, NP  Active   ELIQUIS  5 MG TABS tablet [id number]  TAKE 1 TABLET(5 MG) BY MOUTH TWICE DAILY Melvin Pao, NP  Active   ezetimibe  (ZETIA ) 10 MG tablet [id number] No Take by mouth daily Melvin Pao, NP Taking Active   furosemide  (LASIX ) 40 MG tablet [id number]  TAKE 1 TABLET(40 MG) BY MOUTH DAILY Melvin Pao, NP  Active   gabapentin  (NEURONTIN ) 300 MG capsule [id number] No Take 2 capsules at bedtime. Melvin Pao, NP Taking Active   JARDIANCE  10 MG TABS tablet [id number]  TAKE 1 TABLET(10 MG) BY MOUTH DAILY Melvin Pao, NP  Active   labetalol  (NORMODYNE ) 200 MG tablet [id number] No Take 1 tablet (200 mg total) by mouth 2 (two) times daily. Melvin Pao, NP Taking Active   lidocaine  (HM LIDOCAINE  PATCH) 4 % [id number]  Place 1 patch onto the skin daily. Melvin Pao, NP  Active   olmesartan  (BENICAR ) 20 MG tablet [id number] No Take 1 tablet (20 mg total) by mouth daily. Melvin Pao, NP Taking Active   OXYGEN  [id number] No Inhale into the lungs daily. 3mL continuously daily. [provider] Taking Active   pantoprazole  (PROTONIX ) 40 MG tablet [id number] No TAKE 1 TABLET BY MOUTH EVERY DAY Melvin Pao, NP Taking Active   Psyllium (METAMUCIL FIBER PO) [id number] No Take 1 Dose by mouth as needed. Patient uses Metamucil Powder in the AM [provider] Taking Active             Recommendation:   {RECOMMENDATONS:32554}  Follow Up Plan:   {FOLLOWUP:32559}  SIG ***

## 2023-10-28 NOTE — Patient Instructions (Signed)
 Thank you for allowing the Complex Care Management team to participate in your care. It was great speaking with you!  We will follow up on November 28, 2023 at 2 pm. Please do not hesitate to contact me if you require assistance prior to our next outreach.    Jackson Acron [facility] Population Health RN Care Manager Direct Dial: [phone]  Fax: [phone] Website: [email or web address]

## 2023-11-07 ENCOUNTER — Other Ambulatory Visit: Payer: Self-pay | Admitting: Nurse Practitioner

## 2023-11-08 NOTE — Telephone Encounter (Signed)
 Refused Lasix  20 mg because the dose was increased to 40 mg on 10/23/2023, #90, 1 refill given.   Requested Prescriptions  Pending Prescriptions Disp Refills   furosemide  (LASIX ) 20 MG tablet [Pharmacy Med Name: FUROSEMIDE  20MG  TABLETS] 180 tablet 1    Sig: TAKE 1 TABLET(20 MG) BY MOUTH TWICE DAILY AS NEEDED     Cardiovascular:  Diuretics - Loop Failed - 11/08/2023  1:18 PM      Failed - Cr in normal range and within 180 days    Creatinine, Ser  Date Value Ref Range Status  08/23/2023 1.55 (H) 0.57 - 1.00 mg/dL Final         Failed - Mg Level in normal range and within 180 days    Magnesium  Date Value Ref Range Status  03/26/2023 2.3 1.7 - 2.4 mg/dL Final    Comment:    Performed at [facility], [address] [place], [place] [address]         Passed - K in normal range and within 180 days    Potassium  Date Value Ref Range Status  08/23/2023 4.5 3.5 - 5.2 mmol/L Final         Passed - Ca in normal range and within 180 days    Calcium   Date Value Ref Range Status  08/23/2023 9.3 8.7 - 10.3 mg/dL Final         Passed - Na in normal range and within 180 days    Sodium  Date Value Ref Range Status  08/23/2023 140 134 - 144 mmol/L Final         Passed - Cl in normal range and within 180 days    Chloride  Date Value Ref Range Status  08/23/2023 101 96 - 106 mmol/L Final         Passed - Last BP in normal range    BP Readings from Last 1 Encounters:  09/04/23 106/68         Passed - Valid encounter within last 6 months    Recent Outpatient Visits           2 months ago Muscle spasm   [place] [facility]o, NP   2 months ago Primary pulmonary hypertension [facility])   [place] [facility] Melvin Pao, NP   5 months ago Primary pulmonary hypertension [facility])   [place] [facility] Melvin Pao, NP

## 2023-11-28 ENCOUNTER — Telehealth: Payer: Self-pay

## 2023-12-02 ENCOUNTER — Ambulatory Visit: Admitting: Nurse Practitioner

## 2023-12-02 ENCOUNTER — Other Ambulatory Visit: Payer: Self-pay | Admitting: Nurse Practitioner

## 2023-12-02 NOTE — Progress Notes (Deleted)
 There were no vitals taken for this visit.   Subjective:    Patient ID: Michelle Jenkins, female    DOB: [birth year], 78 y.o.   MRN: [id number]  HPI: Michelle Jenkins is a 78 y.o. female  No chief complaint on file.  HYPERTENSION / HYPERLIPIDEMIA Satisfied with current treatment? yes Duration of hypertension: years BP monitoring frequency: not checking BP range:  BP medication side effects: no Past BP meds: olmesartan -HCTZ, Lasix  Duration of hyperlipidemia: years Cholesterol medication side effects: no Cholesterol supplements: none Past cholesterol medications: atorvastain (lipitor) Medication compliance: excellent compliance Aspirin: no Recent stressors: no Recurrent headaches: no Visual changes: no Palpitations: no Dyspnea: yes Chest pain: no Lower extremity edema: no Dizzy/lightheaded: no  COPD See's pulmonology on May 21.  Does have Pulmonary HTN.  COPD status: controlled Satisfied with current treatment?: yes Oxygen  use: yes (3L) Dyspnea frequency: SOB Cough frequency: Sometimes Rescue inhaler frequency:   Limitation of activity: yes Productive cough: yes Last Spirometry:  Pneumovax: Up to Date Influenza: Up to Date     Relevant past medical, surgical, family and social history reviewed and updated as indicated. Interim medical history since our last visit reviewed. Allergies and medications reviewed and updated.  Review of Systems  Eyes:  Negative for visual disturbance.  Respiratory:  Negative for cough, chest tightness and shortness of breath.   Cardiovascular:  Negative for chest pain, palpitations and leg swelling.  Musculoskeletal:        Right knee pain  Neurological:  Negative for dizziness and headaches.    Per HPI unless specifically indicated above     Objective:     There were no vitals taken for this visit.  Wt Readings from Last 3 Encounters:  10/28/23 291 lb (132 kg)  09/20/23 280 lb (127 kg)  09/04/23 282 lb (127.9 kg)     Physical Exam Vitals and nursing note reviewed.  Constitutional:      General: She is not in acute distress.    Appearance: Normal appearance. She is normal weight. She is not ill-appearing, toxic-appearing or diaphoretic.  HENT:     Head: Normocephalic.     Right Ear: External ear normal.     Left Ear: External ear normal.     Nose: Nose normal.     Mouth/Throat:     Mouth: Mucous membranes are moist.     Pharynx: Oropharynx is clear.  Eyes:     General:        Right eye: No discharge.        Left eye: No discharge.     Extraocular Movements: Extraocular movements intact.     Conjunctiva/sclera: Conjunctivae normal.     Pupils: Pupils are equal, round, and reactive to light.  Cardiovascular:     Rate and Rhythm: Normal rate and regular rhythm.     Heart sounds: No murmur heard. Pulmonary:     Effort: Pulmonary effort is normal. No respiratory distress.     Breath sounds: Normal breath sounds. No wheezing or rales.  Musculoskeletal:        General: Swelling and tenderness present.     Cervical back: Normal range of motion and neck supple.  Skin:    General: Skin is warm and dry.     Capillary Refill: Capillary refill takes less than 2 seconds.  Neurological:     General: No focal deficit present.     Mental Status: She is alert and oriented to person, place, and time. Mental status is  at baseline.  Psychiatric:        Mood and Affect: Mood normal.        Behavior: Behavior normal.        Thought Content: Thought content normal.        Judgment: Judgment normal.     Results for orders placed or performed in visit on 08/23/23  Comprehensive metabolic panel with GFR   Collection Time: 08/23/23 10:44 AM  Result Value Ref Range   Glucose 96 70 - 99 mg/dL   BUN 41 (H) 8 - 27 mg/dL   Creatinine, Ser 8.44 (H) 0.57 - 1.00 mg/dL   eGFR 34 (L) >40 fO/fpw/8.26   BUN/Creatinine Ratio 26 12 - 28   Sodium 140 134 - 144 mmol/L   Potassium 4.5 3.5 - 5.2 mmol/L   Chloride 101 96  - 106 mmol/L   CO2 25 20 - 29 mmol/L   Calcium  9.3 8.7 - 10.3 mg/dL   Total Protein 6.7 6.0 - 8.5 g/dL   Albumin 4.3 3.8 - 4.8 g/dL   Globulin, Total 2.4 1.5 - 4.5 g/dL   Bilirubin Total 0.6 0.0 - 1.2 mg/dL   Alkaline Phosphatase 84 44 - 121 IU/L   AST 20 0 - 40 IU/L   ALT 10 0 - 32 IU/L  Hemoglobin A1c   Collection Time: 08/23/23 10:44 AM  Result Value Ref Range   Hgb A1c MFr Bld 6.4 (H) 4.8 - 5.6 %   Est. average glucose Bld gHb Est-mCnc 137 mg/dL  Lipid panel   Collection Time: 08/23/23 10:44 AM  Result Value Ref Range   Cholesterol, Total 165 100 - 199 mg/dL   Triglycerides 36 0 - 149 mg/dL   HDL 90 >60 mg/dL   VLDL Cholesterol Cal 8 5 - 40 mg/dL   LDL Chol Calc (NIH) 67 0 - 99 mg/dL   Chol/HDL Ratio 1.8 0.0 - 4.4 ratio      Assessment & Plan:   Problem List Items Addressed This Visit       Cardiovascular and Mediastinum   Primary pulmonary hypertension (HCC) - Primary   Hypertension   Atrial fibrillation (HCC)   Diastolic CHF, acute on chronic (HCC)     Endocrine   IFG (impaired fasting glucose)     Genitourinary   CKD stage 3a, GFR 45-59 ml/min (HCC)     Other   Hypercholesteremia    Procedure: Right  Knee Intraarticular Steroid Injection        Diagnosis:   ICD-10-CM   1. Primary pulmonary hypertension (HCC)  I27.0     2. IFG (impaired fasting glucose)  R73.01     3. Primary hypertension  I10     4. Hypercholesteremia  E78.00     5. Diastolic CHF, acute on chronic (HCC)  I50.33     6. CKD stage 3a, GFR 45-59 ml/min (HCC)  N18.31     7. Atrial fibrillation, unspecified type [facility])  I48.91       Provider: Darice Petty, NP Consent:  Risks, benefits, and alternative treatments discussed and all questions were answered.  Patient elected to proceed and verbal consent obtained.  Description: Area prepped and draped using  semi-sterile technique.  Using a anterior/lateral approach, a mixture of 4 cc of  1% lidocaine  & 1 cc of Kenalog  40 was  injected into knee joint.  A bandage was then placed over the injection site. Complications: none Post Procedure Instructions: Wound care instructions discussed and patient was instructed to keep  area clean and dry.  Signs and symptoms of infection discussed, patient agrees to contact the office ASAP should they occur.  Follow Up: No follow-ups on file.  Follow up plan: No follow-ups on file.

## 2023-12-04 ENCOUNTER — Other Ambulatory Visit: Payer: Self-pay | Admitting: Nurse Practitioner

## 2023-12-04 NOTE — Telephone Encounter (Signed)
 Requested Prescriptions  Pending Prescriptions Disp Refills   ezetimibe  (ZETIA ) 10 MG tablet [Pharmacy Med Name: EZETIMIBE  10MG  TABLETS] 90 tablet 0    Sig: TAKE 1 TABLET(10 MG) BY MOUTH DAILY     Cardiovascular:  Antilipid - Sterol Transport Inhibitors Failed - 12/04/2023  9:54 AM      Failed - Lipid Panel in normal range within the last 12 months    Cholesterol, Total  Date Value Ref Range Status  08/23/2023 165 100 - 199 mg/dL Final   Cholesterol Piccolo, Waived  Date Value Ref Range Status  09/05/2016 154 <200 mg/dL Final    Comment:                            Desirable                <200                         Borderline High      200- 239                         High                     >239    LDL Chol Calc (NIH)  Date Value Ref Range Status  08/23/2023 67 0 - 99 mg/dL Final   HDL  Date Value Ref Range Status  08/23/2023 90 >39 mg/dL Final   Triglycerides  Date Value Ref Range Status  08/23/2023 36 0 - 149 mg/dL Final   Triglycerides Piccolo,Waived  Date Value Ref Range Status  09/05/2016 46 <150 mg/dL Final    Comment:                            Normal                   <150                         Borderline High     150 - 199                         High                200 - 499                         Very High                >499          Passed - AST in normal range and within 360 days    AST  Date Value Ref Range Status  08/23/2023 20 0 - 40 IU/L Final         Passed - ALT in normal range and within 360 days    ALT  Date Value Ref Range Status  08/23/2023 10 0 - 32 IU/L Final         Passed - Patient is not pregnant      Passed - Valid encounter within last 12 months    Recent Outpatient Visits           3 months ago Muscle spasm   [place] [facility] Melvin Pao, NP  3 months ago Primary pulmonary hypertension [facility])   [place] [facility] Melvin Pao, NP   6 months ago Primary pulmonary  hypertension [facility])   [place] [facility] Melvin Pao, NP

## 2023-12-04 NOTE — Telephone Encounter (Signed)
 Duplicate request, refilled 12/04/23.  Requested Prescriptions  Pending Prescriptions Disp Refills   ezetimibe  (ZETIA ) 10 MG tablet 90 tablet 1    Sig: Take by mouth daily     Cardiovascular:  Antilipid - Sterol Transport Inhibitors Failed - 12/04/2023  9:55 AM      Failed - Lipid Panel in normal range within the last 12 months    Cholesterol, Total  Date Value Ref Range Status  08/23/2023 165 100 - 199 mg/dL Final   Cholesterol Piccolo, Waived  Date Value Ref Range Status  09/05/2016 154 <200 mg/dL Final    Comment:                            Desirable                <200                         Borderline High      200- 239                         High                     >239    LDL Chol Calc (NIH)  Date Value Ref Range Status  08/23/2023 67 0 - 99 mg/dL Final   HDL  Date Value Ref Range Status  08/23/2023 90 >39 mg/dL Final   Triglycerides  Date Value Ref Range Status  08/23/2023 36 0 - 149 mg/dL Final   Triglycerides Piccolo,Waived  Date Value Ref Range Status  09/05/2016 46 <150 mg/dL Final    Comment:                            Normal                   <150                         Borderline High     150 - 199                         High                200 - 499                         Very High                >499          Passed - AST in normal range and within 360 days    AST  Date Value Ref Range Status  08/23/2023 20 0 - 40 IU/L Final         Passed - ALT in normal range and within 360 days    ALT  Date Value Ref Range Status  08/23/2023 10 0 - 32 IU/L Final         Passed - Patient is not pregnant      Passed - Valid encounter within last 12 months    Recent Outpatient Visits           3 months ago Muscle spasm   [facility] Melvin Pao, NP   3 months  ago Primary pulmonary hypertension [facility])   [place] [facility] Melvin Pao, NP   6 months ago Primary pulmonary hypertension [facility])    [place] [facility] Melvin Pao, NP

## 2023-12-04 NOTE — Telephone Encounter (Signed)
 Copied from CRM #[id number]. Topic: Clinical - Medication Refill >> Dec 04, 2023  9:40 AM Zebedee SAUNDERS wrote: Medication: ezetimibe  (ZETIA ) 10 MG tablet  Has the patient contacted their pharmacy? Yes (Agent: If no, request that the patient contact the pharmacy for the refill. If patient does not wish to contact the pharmacy document the reason why and proceed with request.) (Agent: If yes, when and what did the pharmacy advise?)Pharmacy need script and PCP approval.   This is the patient's preferred pharmacy:  [id number] [place], [place] - [address] AT [facility] [address] [place] [address] [place] [address] Phone: [phone] Fax: [phone]  Is this the correct pharmacy for this prescription? Yes If no, delete pharmacy and type the correct one.   Has the prescription been filled recently? Yes  Is the patient out of the medication? Yes  Has the patient been seen for an appointment in the last year OR does the patient have an upcoming appointment? Yes  Can we respond through MyChart? Yes  Agent: Please be advised that Rx refills may take up to 3 business days. We ask that you follow-up with your pharmacy.

## 2023-12-24 ENCOUNTER — Encounter: Payer: Self-pay | Admitting: Nurse Practitioner

## 2023-12-24 ENCOUNTER — Ambulatory Visit: Admitting: Nurse Practitioner

## 2023-12-24 VITALS — BP 110/65 | HR 79 | Temp 98.9°F | Ht 65.0 in | Wt 288.4 lb

## 2023-12-24 DIAGNOSIS — I4891 Unspecified atrial fibrillation: Secondary | ICD-10-CM

## 2023-12-24 DIAGNOSIS — E66813 Obesity, class 3: Secondary | ICD-10-CM

## 2023-12-24 DIAGNOSIS — N1831 Chronic kidney disease, stage 3a: Secondary | ICD-10-CM | POA: Diagnosis not present

## 2023-12-24 DIAGNOSIS — Z6841 Body Mass Index (BMI) 40.0 and over, adult: Secondary | ICD-10-CM

## 2023-12-24 DIAGNOSIS — I10 Essential (primary) hypertension: Secondary | ICD-10-CM | POA: Diagnosis not present

## 2023-12-24 DIAGNOSIS — E7800 Pure hypercholesterolemia, unspecified: Secondary | ICD-10-CM

## 2023-12-24 DIAGNOSIS — R7301 Impaired fasting glucose: Secondary | ICD-10-CM | POA: Diagnosis not present

## 2023-12-24 DIAGNOSIS — Z23 Encounter for immunization: Secondary | ICD-10-CM | POA: Diagnosis not present

## 2023-12-24 DIAGNOSIS — I270 Primary pulmonary hypertension: Secondary | ICD-10-CM

## 2023-12-24 NOTE — Assessment & Plan Note (Signed)
 Chronic. Last A1c was 6.4%. Labs ordered at visit today.  Will make recommendations based on lab results.

## 2023-12-24 NOTE — Assessment & Plan Note (Signed)
 Chronic. Followed by Pulmonology.  Now on Letairis .  Continue to follow up with specialist. On 3L O2.  Only using Oxygen  when she is up and about.  Otherwise, does not have SOB.  Continue with current medication regimen.

## 2023-12-24 NOTE — Assessment & Plan Note (Signed)
Chronic.  Controlled.  Continue with current medication regimen.  Return to clinic in 3 months for reevaluation.  Call sooner if concerns arise.

## 2023-12-24 NOTE — Assessment & Plan Note (Signed)
Chronic.  Controlled.  Continue with current medication regimen of Atorvastatin 20mg  daily.  Labs ordered today.  Return to clinic in 6 months for reevaluation.  Call sooner if concerns arise.

## 2023-12-24 NOTE — Assessment & Plan Note (Signed)
 Chronic.  Controlled.  Continue with current medication regimen.  Continue with Eliquis .   Labs ordered today.  Return to clinic in 6 months for reevaluation.  Call sooner if concerns arise.

## 2023-12-24 NOTE — Progress Notes (Signed)
 BP 110/65   Pulse 79   Temp 98.9 F (37.2 C) (Oral)   Ht 5' 5 (1.651 m)   Wt 288 lb 6.4 oz (130.8 kg)   SpO2 92%   BMI 47.99 kg/m    Subjective:    Patient ID: Michelle Jenkins, female    DOB: 1945/04/22, 78 y.o.   MRN: [id number]  HPI: Michelle Jenkins is a 78 y.o. female  Chief Complaint  Patient presents with   Chronic Kidney Disease   Hyperlipidemia   Hypertension   HYPERTENSION / HYPERLIPIDEMIA Satisfied with current treatment? yes Duration of hypertension: years BP monitoring frequency: not checking BP range:  BP medication side effects: no Past BP meds: olmesartan -HCTZ, Lasix  Duration of hyperlipidemia: years Cholesterol medication side effects: no Cholesterol supplements: none Past cholesterol medications: atorvastain (lipitor) Medication compliance: excellent compliance Aspirin: no Recent stressors: no Recurrent headaches: no Visual changes: no Palpitations: no Dyspnea: yes Chest pain: no Lower extremity edema: no Dizzy/lightheaded: no  COPD See's pulmonology.  Does have Pulmonary HTN. She only uses the Oxygen  when she is up and walking around.  COPD status: controlled Satisfied with current treatment?: yes Oxygen  use: yes (3L) Dyspnea frequency: SOB Cough frequency: Sometimes Rescue inhaler frequency:   Limitation of activity: yes Productive cough: yes Last Spirometry:  Pneumovax: Up to Date Influenza: Up to Date     Relevant past medical, surgical, family and social history reviewed and updated as indicated. Interim medical history since our last visit reviewed. Allergies and medications reviewed and updated.  Review of Systems  Eyes:  Negative for visual disturbance.  Respiratory:  Negative for cough, chest tightness and shortness of breath.   Cardiovascular:  Negative for chest pain, palpitations and leg swelling.  Musculoskeletal:        Right knee pain  Neurological:  Negative for dizziness and headaches.    Per HPI unless  specifically indicated above     Objective:     BP 110/65   Pulse 79   Temp 98.9 F (37.2 C) (Oral)   Ht 5' 5 (1.651 m)   Wt 288 lb 6.4 oz (130.8 kg)   SpO2 92%   BMI 47.99 kg/m   Wt Readings from Last 3 Encounters:  12/24/23 288 lb 6.4 oz (130.8 kg)  10/28/23 291 lb (132 kg)  09/20/23 280 lb (127 kg)    Physical Exam Vitals and nursing note reviewed.  Constitutional:      General: She is not in acute distress.    Appearance: Normal appearance. She is normal weight. She is not ill-appearing, toxic-appearing or diaphoretic.  HENT:     Head: Normocephalic.     Right Ear: External ear normal.     Left Ear: External ear normal.     Nose: Nose normal.     Mouth/Throat:     Mouth: Mucous membranes are moist.     Pharynx: Oropharynx is clear.  Eyes:     General:        Right eye: No discharge.        Left eye: No discharge.     Extraocular Movements: Extraocular movements intact.     Conjunctiva/sclera: Conjunctivae normal.     Pupils: Pupils are equal, round, and reactive to light.  Cardiovascular:     Rate and Rhythm: Normal rate and regular rhythm.     Heart sounds: No murmur heard. Pulmonary:     Effort: Pulmonary effort is normal. No respiratory distress.     Breath sounds:  Normal breath sounds. No wheezing or rales.  Musculoskeletal:     Cervical back: Normal range of motion and neck supple.  Skin:    General: Skin is warm and dry.     Capillary Refill: Capillary refill takes less than 2 seconds.  Neurological:     General: No focal deficit present.     Mental Status: She is alert and oriented to person, place, and time. Mental status is at baseline.  Psychiatric:        Mood and Affect: Mood normal.        Behavior: Behavior normal.        Thought Content: Thought content normal.        Judgment: Judgment normal.     Results for orders placed or performed in visit on 08/23/23  Comprehensive metabolic panel with GFR   Collection Time: 08/23/23 10:44 AM   Result Value Ref Range   Glucose 96 70 - 99 mg/dL   BUN 41 (H) 8 - 27 mg/dL   Creatinine, Ser 8.44 (H) 0.57 - 1.00 mg/dL   eGFR 34 (L) >40 fO/fpw/8.26   BUN/Creatinine Ratio 26 12 - 28   Sodium 140 134 - 144 mmol/L   Potassium 4.5 3.5 - 5.2 mmol/L   Chloride 101 96 - 106 mmol/L   CO2 25 20 - 29 mmol/L   Calcium  9.3 8.7 - 10.3 mg/dL   Total Protein 6.7 6.0 - 8.5 g/dL   Albumin 4.3 3.8 - 4.8 g/dL   Globulin, Total 2.4 1.5 - 4.5 g/dL   Bilirubin Total 0.6 0.0 - 1.2 mg/dL   Alkaline Phosphatase 84 44 - 121 IU/L   AST 20 0 - 40 IU/L   ALT 10 0 - 32 IU/L  Hemoglobin A1c   Collection Time: 08/23/23 10:44 AM  Result Value Ref Range   Hgb A1c MFr Bld 6.4 (H) 4.8 - 5.6 %   Est. average glucose Bld gHb Est-mCnc 137 mg/dL  Lipid panel   Collection Time: 08/23/23 10:44 AM  Result Value Ref Range   Cholesterol, Total 165 100 - 199 mg/dL   Triglycerides 36 0 - 149 mg/dL   HDL 90 >60 mg/dL   VLDL Cholesterol Cal 8 5 - 40 mg/dL   LDL Chol Calc (NIH) 67 0 - 99 mg/dL   Chol/HDL Ratio 1.8 0.0 - 4.4 ratio      Assessment & Plan:   Problem List Items Addressed This Visit       Cardiovascular and Mediastinum   Primary pulmonary hypertension (HCC) - Primary   Chronic. Followed by Pulmonology.  Now on Letairis .  Continue to follow up with specialist. On 3L O2.  Only using Oxygen  when she is up and about.  Otherwise, does not have SOB.  Continue with current medication regimen.       Relevant Medications   spironolactone (ALDACTONE) 25 MG tablet   Hypertension   Chronic.  Controlled.  Continue with current medication regimen.   Return to clinic in 3 months for reevaluation.  Call sooner if concerns arise.       Relevant Medications   spironolactone (ALDACTONE) 25 MG tablet   Atrial fibrillation (HCC)   Chronic.  Controlled.  Continue with current medication regimen.  Continue with Eliquis .   Labs ordered today.  Return to clinic in 6 months for reevaluation.  Call sooner if concerns  arise.       Relevant Medications   spironolactone (ALDACTONE) 25 MG tablet     Endocrine  IFG (impaired fasting glucose)   Chronic. Last A1c was 6.4%. Labs ordered at visit today.  Will make recommendations based on lab results.        Relevant Orders   Hemoglobin A1c     Genitourinary   CKD stage 3a, GFR 45-59 ml/min (HCC)   Chronic.  Labs ordered.  Continue to keep blood pressure well controlled.  Continue with ACE.  Labs ordered today.  Follow up in 6 months.  Calls sooner if concerns arise.       Relevant Orders   Comprehensive metabolic panel with GFR     Other   Hypercholesteremia   Chronic.  Controlled.  Continue with current medication regimen of Atorvastatin  20mg  daily.   Labs ordered today.  Return to clinic in 6 months for reevaluation.  Call sooner if concerns arise.       Relevant Medications   spironolactone (ALDACTONE) 25 MG tablet   Other Relevant Orders   Lipid panel   Obesity, Class III, BMI 40-49.9 (morbid obesity)   Recommended eating smaller high protein, low fat meals more frequently and exercising 30 mins a day 5 times a week with a goal of 10-15lb weight loss in the next 3 months.       Other Visit Diagnoses       Need for influenza vaccination       Relevant Orders   Flu vaccine HIGH DOSE PF(Fluzone Trivalent)      Follow up plan: Return in about 6 months (around 06/22/2024) for HTN, HLD, DM2 FU.

## 2023-12-24 NOTE — Assessment & Plan Note (Signed)
 Recommended eating smaller high protein, low fat meals more frequently and exercising 30 mins a day 5 times a week with a goal of 10-15lb weight loss in the next 3 months.

## 2023-12-24 NOTE — Assessment & Plan Note (Addendum)
 Chronic.  Labs ordered.  Continue to keep blood pressure well controlled.  Continue with ACE.  Labs ordered today.  Follow up in 6 months.  Calls sooner if concerns arise.

## 2023-12-25 ENCOUNTER — Ambulatory Visit: Payer: Self-pay | Admitting: Nurse Practitioner

## 2023-12-25 LAB — COMPREHENSIVE METABOLIC PANEL WITH GFR
ALT: 7 IU/L (ref 0–32)
AST: 19 IU/L (ref 0–40)
Albumin: 4.4 g/dL (ref 3.8–4.8)
Alkaline Phosphatase: 73 IU/L (ref 44–121)
BUN/Creatinine Ratio: 21 (ref 12–28)
BUN: 26 mg/dL (ref 8–27)
Bilirubin Total: 0.7 mg/dL (ref 0.0–1.2)
CO2: 25 mmol/L (ref 20–29)
Calcium: 9.5 mg/dL (ref 8.7–10.3)
Chloride: 104 mmol/L (ref 96–106)
Creatinine, Ser: 1.26 mg/dL — ABNORMAL HIGH (ref 0.57–1.00)
Globulin, Total: 2.3 g/dL (ref 1.5–4.5)
Glucose: 97 mg/dL (ref 70–99)
Potassium: 3.8 mmol/L (ref 3.5–5.2)
Sodium: 141 mmol/L (ref 134–144)
Total Protein: 6.7 g/dL (ref 6.0–8.5)
eGFR: 43 mL/min/1.73 — ABNORMAL LOW (ref >59)

## 2023-12-25 LAB — LIPID PANEL
Chol/HDL Ratio: 2.1 ratio (ref 0.0–4.4)
Cholesterol, Total: 168 mg/dL (ref 100–199)
HDL: 81 mg/dL (ref >39)
LDL Chol Calc (NIH): 76 mg/dL (ref 0–99)
Triglycerides: 52 mg/dL (ref 0–149)
VLDL Cholesterol Cal: 11 mg/dL (ref 5–40)

## 2023-12-25 LAB — HEMOGLOBIN A1C
Est. average glucose Bld gHb Est-mCnc: 120 mg/dL
Hgb A1c MFr Bld: 5.8 % — ABNORMAL HIGH (ref 4.8–5.6)

## 2023-12-26 ENCOUNTER — Other Ambulatory Visit: Payer: Self-pay

## 2023-12-30 NOTE — Patient Outreach (Signed)
 Complex Care Management   Visit Note    Name:  Michelle Jenkins MRN: [id number] DOB: [birth year]  Situation: Referral received for Complex Care Management related to Heart Failure and COPD I obtained verbal consent from Patient.  Visit completed with Michelle Jenkins via telephone.  Background:   Past Medical History:  Diagnosis Date   Anginal pain (HCC)    Asthma    Bronchitis    CAD (coronary artery disease)    CHF (congestive heart failure) (HCC)    CVA (cerebral infarction)    Dysrhythmia    GERD (gastroesophageal reflux disease)    History of chronic atrial fibrillation    Hypercholesteremia    Hypertension    Idiopathic pulmonary hypertension (HCC)    Myocardial infarction (HCC)    Obesity    OSA (obstructive sleep apnea)    Pre-diabetes    Pulmonary artery hypertension (HCC)    Shortness of breath dyspnea    Sleep apnea    Stroke [facility])     Assessment: Patient Reported Symptoms: Cognitive Cognitive Status: Alert and oriented to person, place, and time, Normal speech and language skills Cognitive/Intellectual Conditions Management [RPT]: None reported or documented in medical history or problem list Health Maintenance Behaviors: Annual physical exam Healing Pattern: Average Health Facilitated by: Pain control, Rest  Neurological Neurological Review of Symptoms: No symptoms reported Neurological Management Strategies: Coping strategies, Medication therapy, Routine screening, Medical device Neurological Self-Management Outcome: 4 (good)  HEENT HEENT Symptoms Reported: Other: (Loss of vision in left eye due to stroke)  Cardiovascular Cardiovascular Symptoms Reported: No symptoms reported Does patient have uncontrolled Hypertension?: No Weight: 285 lb (129.3 kg)  Respiratory Respiratory Symptoms Reported: No symptoms reported  Endocrine Endocrine Symptoms Reported: No symptoms reported Is patient diabetic?: No  Gastrointestinal Gastrointestinal Symptoms Reported: No  symptoms reported  Genitourinary Genitourinary Symptoms Reported: No symptoms reported  Integumentary Integumentary Symptoms Reported: No symptoms reported  Musculoskeletal Musculoskelatal Symptoms Reviewed: Unsteady gait Additional Musculoskeletal Details: Reports unsteady gait due to stroke. Reports currenlty using walker with ambulation.  Psychosocial Psychosocial Symptoms Reported: No symptoms reported Quality of Family Relationships: supportive, helpful, involved Do you feel physically threatened by others?: No   There were no vitals filed for this visit.  Medications Reviewed Today     Reviewed by Karoline Lima, RN (Registered Nurse) on 12/26/23 at 1424  Med List Status: <None>   Medication Order Taking? Sig Documenting Provider Last Dose Status Informant  albuterol  (VENTOLIN  HFA) 108 (90 Base) MCG/ACT inhaler [id number]  INHALE 1 TO 2 PUFFS INTO THE LUNGS EVERY 6 HOURS AS NEEDED Melvin Pao, NP  Active   ambrisentan  (LETAIRIS ) 10 MG tablet [id number]  Take 10 mg by mouth daily. [provider]  Active   atorvastatin  (LIPITOR) 20 MG tablet [id number]  TAKE 1 TABLET(20 MG) BY MOUTH DAILY Melvin Pao, NP  Active   cyclobenzaprine  (FLEXERIL ) 10 MG tablet [id number]  Take 1 tablet (10 mg total) by mouth 3 (three) times daily as needed for muscle spasms. Melvin Pao, NP  Active   ELIQUIS  5 MG TABS tablet [id number]  TAKE 1 TABLET(5 MG) BY MOUTH TWICE DAILY Melvin Pao, NP  Active   ezetimibe  (ZETIA ) 10 MG tablet [id number]  TAKE 1 TABLET(10 MG) BY MOUTH DAILY Melvin Pao, NP  Active   furosemide  (LASIX ) 40 MG tablet [id number]  TAKE 1 TABLET(40 MG) BY MOUTH DAILY Melvin Pao, NP  Active   gabapentin  (NEURONTIN ) 300 MG capsule [id number]  Take 2 capsules at bedtime. Melvin Pao, NP  Active   JARDIANCE  10 MG TABS tablet [id number]  TAKE 1 TABLET(10 MG) BY MOUTH DAILY Melvin Pao, NP  Active   labetalol  (NORMODYNE ) 200 MG tablet [id number]  Take  1 tablet (200 mg total) by mouth 2 (two) times daily. Melvin Pao, NP  Active   lidocaine  (HM LIDOCAINE  PATCH) 4 % [id number]  Place 1 patch onto the skin daily. Melvin Pao, NP  Active   olmesartan  (BENICAR ) 20 MG tablet [id number]  Take 1 tablet (20 mg total) by mouth daily. Melvin Pao, NP  Active   OXYGEN  [id number]  Inhale into the lungs daily. 3mL continuously daily. [provider]  Active   pantoprazole  (PROTONIX ) 40 MG tablet [id number]  TAKE 1 TABLET BY MOUTH EVERY DAY Melvin Pao, NP  Active   Psyllium (METAMUCIL FIBER PO) [id number]  Take 1 Dose by mouth as needed. Patient uses Metamucil Powder in the AM [provider]  Active   spironolactone (ALDACTONE) 25 MG tablet [id number]  Take 25 mg by mouth daily. [provider]  Active             Recommendation:   Continue Current Plan of Care  Follow Up Plan:   Patient has met all care management goals. Care Management case will be closed. Patient has been provided contact information should new needs arise.    Jackson Acron [facility] Population Health RN Care Manager Direct Dial: [phone]  Fax: [phone] Website: [email or web address]

## 2023-12-30 NOTE — Patient Instructions (Signed)
 Thank you for allowing the Complex Care Management team to participate in your care. It was great speaking with you.  Congratulations on meeting your care management goals! Please do not hesitate to reach out if your health needs change and you require additional assistance.    Jackson Acron [facility] Population Health RN Care Manager Direct Dial: [phone]  Fax: [phone] Website: [email or web address]

## 2024-01-08 ENCOUNTER — Telehealth: Payer: Self-pay | Admitting: Nurse Practitioner

## 2024-01-08 ENCOUNTER — Ambulatory Visit: Payer: Self-pay

## 2024-01-08 NOTE — Telephone Encounter (Signed)
 Copied from CRM #[id number]. Topic: General - Other >> Jan 08, 2024  3:53 PM Turkey B wrote: Reason for CRM: Lupe from [facility] called just to inform that patient's insurance will only cover up to 100 days for patients maintenance meds

## 2024-01-08 NOTE — Telephone Encounter (Signed)
 FYI

## 2024-01-08 NOTE — Telephone Encounter (Signed)
 FYI Only or Action Required?: Action required by provider: request for appointment.  Patient was last seen in primary care on 12/24/2023 by Melvin Pao, NP.  Called Nurse Triage reporting Neck Pain.  Symptoms began several days ago.  Interventions attempted: Nothing.  Symptoms are: gradually worsening. Started 3 weeks ago.  Triage Disposition: See PCP When Office is Open (Within 3 Days)  Patient/caregiver understands and will follow disposition?:     Copied from CRM #[id number]. Topic: Clinical - Red Word Triage >> Jan 08, 2024  9:14 AM Timindy P wrote: [facility] that prompted transfer to Nurse Triage: Severe pain in neck and shoulder   ----------------------------------------------------------------------- From previous Reason for Contact - Other: Reason for CRM: Reason for Disposition  [1] MODERATE neck pain (e.g., interferes with normal activities) AND [2] present > 3 days  Answer Assessment - Initial Assessment Questions 1. ONSET: When did the pain begin?      3 weeks ago 2. LOCATION: Where does it hurt?      Neck and right shoulder 3. PATTERN Does the pain come and go, or has it been constant since it started?      Comes and goes 4. SEVERITY: How bad is the pain?  (Scale 0-10; or none or slight stiffness, mild, moderate, severe)     10 5. RADIATION: Does the pain go anywhere else, shoot into your arms?     shoulder 6. CORD SYMPTOMS: Any weakness or numbness of the arms or legs?     no 7. CAUSE: What do you think is causing the neck pain?     unsure 8. NECK OVERUSE: Any recent activities that involved turning or twisting the neck?     no 9. OTHER SYMPTOMS: Do you have any other symptoms? (e.g., headache, fever, chest pain, difficulty breathing, neck swelling)     no 10. PREGNANCY: Is there any chance you are pregnant? When was your last menstrual period?       no  Protocols used: Neck Pain or Stiffness-A-AH

## 2024-01-10 ENCOUNTER — Encounter: Payer: Self-pay | Admitting: Pediatrics

## 2024-01-10 ENCOUNTER — Ambulatory Visit (INDEPENDENT_AMBULATORY_CARE_PROVIDER_SITE_OTHER): Admitting: Pediatrics

## 2024-01-10 VITALS — BP 96/61 | HR 57 | Temp 97.3°F | Wt 287.0 lb

## 2024-01-10 DIAGNOSIS — K5900 Constipation, unspecified: Secondary | ICD-10-CM | POA: Diagnosis not present

## 2024-01-10 DIAGNOSIS — M542 Cervicalgia: Secondary | ICD-10-CM | POA: Diagnosis not present

## 2024-01-10 MED ORDER — SENNOSIDES 8.6 MG PO TABS: 1.0000 | ORAL_TABLET | Freq: Every day | ORAL | 0 refills | Status: AC

## 2024-01-10 MED ORDER — MELOXICAM 15 MG PO TABS: 15.0000 mg | ORAL_TABLET | Freq: Every day | ORAL | 0 refills | Status: DC

## 2024-01-10 MED ORDER — POLYETHYLENE GLYCOL 3350 17 GM/SCOOP PO POWD: 17.0000 g | Freq: Two times a day (BID) | ORAL | 1 refills | Status: AC | PRN

## 2024-01-10 NOTE — Progress Notes (Signed)
 Office Visit  BP 96/61   Pulse (!) 57   Temp (!) 97.3 F (36.3 C) (Oral)   Wt 287 lb (130.2 kg)   SpO2 96%   BMI 47.76 kg/m    Subjective:    Michelle Jenkins ID: Michelle Jenkins, female    DOB: 1945-08-16, 78 y.o.   MRN: [id number]  HPI: Michelle Jenkins is a 78 y.o. female  Chief Complaint  Michelle Jenkins presents with   Neck Pain    Ongoing for about 3 weeks     Discussed the use of AI scribe software for clinical note transcription with the Michelle Jenkins, who gave verbal consent to proceed.  History of Present Illness   Michelle Jenkins is a 78 year old female who presents with migrating pain and constipation.  Michelle Jenkins experiences severe migrating pain that starts in one area, moves under Michelle Jenkins legs, and sometimes radiates to different locations. The pain is described as severe, to the point where Michelle Jenkins feels 'about to die' and is scared to breathe. It is intermittent with varying intensity and is described as muscular, starting in the lower area and moving up under Michelle Jenkins shoulder blade. Michelle Jenkins recalls a possible incident a month ago where Michelle Jenkins might have pulled something while rearranging furniture, but Michelle Jenkins did not feel pain at that time. No recent falls.  Michelle Jenkins has not had regular bowel movements and requests medication to aid with this. Previous medications were ineffective. Michelle Jenkins has not had a bowel movement recently and feels this may be contributing to Michelle Jenkins symptoms.  Michelle Jenkins takes all Michelle Jenkins medications in the morning and at night, including Flexeril , but reports that it does not alleviate Michelle Jenkins current symptoms. Michelle Jenkins is allergic to Tylenol  but believes Michelle Jenkins can take ibuprofen. Michelle Jenkins is currently on blood thinners and Protonix . Michelle Jenkins mentions a recent visit to a cardiologist where Michelle Jenkins discussed Michelle Jenkins symptoms but did not receive a conclusive answer. Michelle Jenkins also notes that Michelle Jenkins knee doctor advised weight loss before any knee surgery due to Michelle Jenkins weight. Michelle Jenkins experiences occasional ankle swelling, which Michelle Jenkins attributes to salt intake.       Relevant past medical, surgical, family and social history reviewed and updated as indicated. Interim medical history since our last visit reviewed. Allergies and medications reviewed and updated.  ROS per HPI unless specifically indicated above     Objective:    BP 96/61   Pulse (!) 57   Temp (!) 97.3 F (36.3 C) (Oral)   Wt 287 lb (130.2 kg)   SpO2 96%   BMI 47.76 kg/m   Wt Readings from Last 3 Encounters:  01/10/24 287 lb (130.2 kg)  12/26/23 285 lb (129.3 kg)  12/24/23 288 lb 6.4 oz (130.8 kg)     Physical Exam Constitutional:      Appearance: Normal appearance.  Pulmonary:     Effort: Pulmonary effort is normal.  Musculoskeletal:        General: Normal range of motion.  Skin:    Comments: Normal skin color  Neurological:     General: No focal deficit present.     Mental Status: Michelle Jenkins is alert. Mental status is at baseline.  Psychiatric:        Mood and Affect: Mood normal.        Behavior: Behavior normal.        Thought Content: Thought content normal.         12/26/2023    2:27 PM 10/28/2023    2:57 PM 09/20/2023  1:25 PM 08/15/2023   11:15 AM 05/23/2023    1:07 PM  Depression screen PHQ 2/9  Decreased Interest 0 0 0 0 0  Down, Depressed, Hopeless 0 0 0 0 0  PHQ - 2 Score 0 0 0 0 0  Altered sleeping     0  Tired, decreased energy     0  Change in appetite     0  Feeling bad or failure about yourself      0  Trouble concentrating     0  Moving slowly or fidgety/restless     0  Suicidal thoughts     0  PHQ-9 Score     0       10/28/2023    2:57 PM 08/15/2023   11:15 AM 05/23/2023    1:07 PM 04/04/2023   11:06 AM  GAD 7 : Generalized Anxiety Score  Nervous, Anxious, on Edge 0 0 0 0  Control/stop worrying 0 0 0 0  Worry too much - different things 0 0 0 0  Trouble relaxing 0 0 0 0  Restless 0 0 0 0  Easily annoyed or irritable 0 0 0 0  Afraid - awful might happen 0 0 0 0  Total GAD 7 Score 0 0 0 0  Anxiety Difficulty Not difficult at all Not  difficult at all         Assessment & Plan:  Assessment & Plan   Neck pain Intermittent pain likely due to muscle strain from recent activity. No serious conditions suspected. Prefers conservative management. - Prescribed meloxicam  for two weeks. - Provided Voltaren  cream for topical use up to four times daily. - Reassess in two weeks, consider x-ray if no improvement. -     Meloxicam ; Take 1 tablet (15 mg total) by mouth daily.  Dispense: 30 tablet; Refill: 0  Constipation, unspecified constipation type Infrequent bowel movements, previous medications ineffective. May contribute to abdominal discomfort. - Prescribed bowel movement medication, checked insurance coverage. - Advised to contact if no bowel movement in a few days to adjust regimen.  -     Sennosides; Take 1 tablet (8.6 mg total) by mouth daily.  Dispense: 30 tablet; Refill: 0 -     Polyethylene Glycol 3350 ; Take 17 g by mouth 2 (two) times daily as needed. Dissolve 1 capful (17g) in 4-8 ounces of liquid and take by mouth daily.  Dispense: 3350 g; Refill: 1    Follow up plan: Return in about 2 weeks (around 01/24/2024).  Hadassah SHAUNNA Nett, MD  Approximately 30 minutes spent on Michelle Jenkins encounter today including assessment, counseling, diagnosing, treatment plan development, and charting.

## 2024-01-10 NOTE — Patient Instructions (Addendum)
 For constipation: Take miralax  daily and senna to help things move along  For inflammation: Start daily meloxicam  15mg  for 14 days  Voltaren  gel as needed throught the day

## 2024-01-14 ENCOUNTER — Telehealth: Payer: Self-pay | Admitting: Nurse Practitioner

## 2024-01-14 DIAGNOSIS — G8929 Other chronic pain: Secondary | ICD-10-CM

## 2024-01-14 NOTE — Telephone Encounter (Signed)
 She is scheduled for 10/10 already

## 2024-01-14 NOTE — Telephone Encounter (Signed)
 Copied from CRM [id number]. Topic: Referral - Question >> Jan 13, 2024  4:25 PM DeAngela L wrote: Reason for CRM: patient is calling to ask if she could have a referral to the same clinic in 2023 she attended before for shoulder pain and they gave her a shot in her right shoulder last time also   Patient says she has started the medication and she is still in really bad shoulder pains and she is going to give the medication a chance to but the pain as the day she came to the office, This is a pain the moves around in her shoulder on and off  Pt num [phone]

## 2024-01-14 NOTE — Telephone Encounter (Signed)
 I placed the referral to Ortho.

## 2024-01-24 ENCOUNTER — Ambulatory Visit (INDEPENDENT_AMBULATORY_CARE_PROVIDER_SITE_OTHER): Admitting: Nurse Practitioner

## 2024-01-24 ENCOUNTER — Encounter: Payer: Self-pay | Admitting: Nurse Practitioner

## 2024-01-24 VITALS — BP 134/82 | HR 75 | Temp 99.3°F | Ht 65.0 in | Wt 293.0 lb

## 2024-01-24 DIAGNOSIS — M25512 Pain in left shoulder: Secondary | ICD-10-CM | POA: Diagnosis not present

## 2024-01-24 DIAGNOSIS — G8929 Other chronic pain: Secondary | ICD-10-CM

## 2024-01-24 DIAGNOSIS — Z23 Encounter for immunization: Secondary | ICD-10-CM | POA: Diagnosis not present

## 2024-01-24 NOTE — Progress Notes (Signed)
 BP 134/82   Pulse 75   Temp 99.3 F (37.4 C) (Oral)   Ht 5' 5 (1.651 m)   Wt 293 lb (132.9 kg)   SpO2 95%   BMI 48.76 kg/m    Subjective:    Patient ID: Michelle Jenkins, female    DOB: 1946-01-28, 78 y.o.   MRN: [id number]  HPI: Michelle Jenkins is a 78 y.o. female  Chief Complaint  Patient presents with   Neck Pain    Patient states she is scheduled to see Ortho on Monday   Patient states her pain comes and goes.  She doesn't feel much relief from the meloxicam .  States it isn't hurting too bad right now but the pain comes on at times and thing relieves it.  She will be seeing Orthopedics on Monday.      Relevant past medical, surgical, family and social history reviewed and updated as indicated. Interim medical history since our last visit reviewed. Allergies and medications reviewed and updated.  Review of Systems  Skin:        Left shoulder pain    Per HPI unless specifically indicated above     Objective:    BP 134/82   Pulse 75   Temp 99.3 F (37.4 C) (Oral)   Ht 5' 5 (1.651 m)   Wt 293 lb (132.9 kg)   SpO2 95%   BMI 48.76 kg/m   Wt Readings from Last 3 Encounters:  01/24/24 293 lb (132.9 kg)  01/10/24 287 lb (130.2 kg)  12/26/23 285 lb (129.3 kg)    Physical Exam Vitals and nursing note reviewed.  Constitutional:      General: She is not in acute distress.    Appearance: Normal appearance. She is normal weight. She is not ill-appearing, toxic-appearing or diaphoretic.  HENT:     Head: Normocephalic.     Right Ear: External ear normal.     Left Ear: External ear normal.     Nose: Nose normal.     Mouth/Throat:     Mouth: Mucous membranes are moist.     Pharynx: Oropharynx is clear.  Eyes:     General:        Right eye: No discharge.        Left eye: No discharge.     Extraocular Movements: Extraocular movements intact.     Conjunctiva/sclera: Conjunctivae normal.     Pupils: Pupils are equal, round, and reactive to light.   Cardiovascular:     Rate and Rhythm: Normal rate and regular rhythm.     Heart sounds: No murmur heard. Pulmonary:     Effort: Pulmonary effort is normal. No respiratory distress.     Breath sounds: Normal breath sounds. No wheezing or rales.  Musculoskeletal:     Cervical back: Normal range of motion and neck supple.  Skin:    General: Skin is warm and dry.     Capillary Refill: Capillary refill takes less than 2 seconds.  Neurological:     General: No focal deficit present.     Mental Status: She is alert and oriented to person, place, and time. Mental status is at baseline.  Psychiatric:        Mood and Affect: Mood normal.        Behavior: Behavior normal.        Thought Content: Thought content normal.        Judgment: Judgment normal.     Results for orders placed or  performed in visit on 12/24/23  Comprehensive metabolic panel with GFR   Collection Time: 12/24/23  9:50 AM  Result Value Ref Range   Glucose 97 70 - 99 mg/dL   BUN 26 8 - 27 mg/dL   Creatinine, Ser 8.73 (H) 0.57 - 1.00 mg/dL   eGFR 43 (L) >40 fO/fpw/8.26   BUN/Creatinine Ratio 21 12 - 28   Sodium 141 134 - 144 mmol/L   Potassium 3.8 3.5 - 5.2 mmol/L   Chloride 104 96 - 106 mmol/L   CO2 25 20 - 29 mmol/L   Calcium  9.5 8.7 - 10.3 mg/dL   Total Protein 6.7 6.0 - 8.5 g/dL   Albumin 4.4 3.8 - 4.8 g/dL   Globulin, Total 2.3 1.5 - 4.5 g/dL   Bilirubin Total 0.7 0.0 - 1.2 mg/dL   Alkaline Phosphatase 73 44 - 121 IU/L   AST 19 0 - 40 IU/L   ALT 7 0 - 32 IU/L  Hemoglobin A1c   Collection Time: 12/24/23  9:50 AM  Result Value Ref Range   Hgb A1c MFr Bld 5.8 (H) 4.8 - 5.6 %   Est. average glucose Bld gHb Est-mCnc 120 mg/dL  Lipid panel   Collection Time: 12/24/23  9:50 AM  Result Value Ref Range   Cholesterol, Total 168 100 - 199 mg/dL   Triglycerides 52 0 - 149 mg/dL   HDL 81 >60 mg/dL   VLDL Cholesterol Cal 11 5 - 40 mg/dL   LDL Chol Calc (NIH) 76 0 - 99 mg/dL   Chol/HDL Ratio 2.1 0.0 - 4.4 ratio       Assessment & Plan:   Problem List Items Addressed This Visit   None Visit Diagnoses       Chronic left shoulder pain    -  Primary   Continue with Meloxicam  as needed.  Keep appointment with Ortho for Monday.     Need for COVID-19 vaccine       Relevant Orders   Pfizer Comirnaty Covid -19 Vaccine 79yrs and older (Completed)        Follow up plan: No follow-ups on file.

## 2024-01-27 ENCOUNTER — Ambulatory Visit

## 2024-02-05 ENCOUNTER — Telehealth: Payer: Self-pay

## 2024-02-05 ENCOUNTER — Ambulatory Visit

## 2024-02-05 NOTE — Telephone Encounter (Signed)
 Serenity from [facility] called to f/u on a script for a knee brace. She had the wrong fax# and wil refax today

## 2024-02-05 NOTE — Telephone Encounter (Signed)
 Copied from CRM [id number]. Topic: Clinical - Prescription Issue >> Feb 04, 2024  3:13 PM Everette C wrote: Reason for CRM: Serenity with [facility] [employer] has called to request a follow up on a previously submitted prescription request for a knee brace.   Please contact Serenity when possible at [phone]

## 2024-02-07 NOTE — Telephone Encounter (Signed)
 Serenity with [employer] called back in to check on the status of a form she faxed to the provider concerning a knee brace. I spoke with Cleatis and she said it is with the provider and she may get to it today if not it will be on Monday. I told Serenity and she will be on the lookout for it

## 2024-02-08 ENCOUNTER — Other Ambulatory Visit: Payer: Self-pay | Admitting: Nurse Practitioner

## 2024-02-09 ENCOUNTER — Other Ambulatory Visit: Payer: Self-pay | Admitting: Nurse Practitioner

## 2024-02-10 ENCOUNTER — Telehealth: Payer: Self-pay

## 2024-02-10 ENCOUNTER — Ambulatory Visit

## 2024-02-10 ENCOUNTER — Ambulatory Visit (INDEPENDENT_AMBULATORY_CARE_PROVIDER_SITE_OTHER)

## 2024-02-10 DIAGNOSIS — G8929 Other chronic pain: Secondary | ICD-10-CM | POA: Diagnosis not present

## 2024-02-10 DIAGNOSIS — M25511 Pain in right shoulder: Secondary | ICD-10-CM

## 2024-02-10 DIAGNOSIS — M542 Cervicalgia: Secondary | ICD-10-CM

## 2024-02-10 DIAGNOSIS — M4722 Other spondylosis with radiculopathy, cervical region: Secondary | ICD-10-CM

## 2024-02-10 DIAGNOSIS — M19011 Primary osteoarthritis, right shoulder: Secondary | ICD-10-CM | POA: Diagnosis not present

## 2024-02-10 NOTE — Patient Instructions (Signed)

## 2024-02-10 NOTE — Telephone Encounter (Signed)
 Copied from CRM [id number]. Topic: Clinical - Order For Equipment >> Feb 10, 2024 10:45 AM Myrick T wrote: Reason for CRM: Serenity [phone] from [employer] [employer] called to f/u on the request form for patients knee brace. Advised provider has the form but has not had an opportunity to completed but would send another message for her and someone would be following up. Ask for Serenity when calling

## 2024-02-10 NOTE — Progress Notes (Signed)
 Orthopaedic Surgery New Patient Visit   History of Present Illness: The patient is a 78 y.o. female seen in clinic for neck pain and right shoulder pain.  Patient reports symptoms of both neck and shoulder pain began about 3 or 4 months ago.  She denies any trauma or inciting injury.  She reports symptoms come and go.  Her right shoulder has no pain with range of motion but reports occasional deep pain in the joint.  Denies associated weakness or instability in the shoulder.  In regards to the neck pain, symptoms are brought on with rotation of the neck to the right as well as sidebending.  Pain will radiate into her shoulder blades.  She also has some subjective numbness in the fingertips of bilateral hands.  No other symptoms to report today per patient.  She does have an orthopedic history significant for left total knee arthroplasty.  She was also seen by outside orthopedic provider for her right knee osteoarthritis last year.  She has many medical comorbidities including CAD, CHF.  She presents today in a wheelchair as she is unable to ambulate for long periods of time.  However, she does live at home and states she is fairly independent around the house.  Patient is retired.     Past Medical, Social and Family History: Past Medical History:  Diagnosis Date   Anginal pain    Asthma    Bronchitis    CAD (coronary artery disease)    CHF (congestive heart failure) (HCC)    CVA (cerebral infarction)    Dysrhythmia    GERD (gastroesophageal reflux disease)    History of chronic atrial fibrillation    Hypercholesteremia    Hypertension    Idiopathic pulmonary hypertension (HCC)    Myocardial infarction (HCC)    Obesity    OSA (obstructive sleep apnea)    Pre-diabetes    Pulmonary artery hypertension (HCC)    Shortness of breath dyspnea    Sleep apnea    Stroke [facility])    Past Surgical History:  Procedure Laterality Date   BREAST BIOPSY Right 11/15/2015   stereo, COMPLEX  SCLEROSING LESION WITH INTRADUCTAL PAPILLOMA COMPONENT AND    BREAST EXCISIONAL BIOPSY Right 02/27/2016   NEG   BREAST LUMPECTOMY WITH NEEDLE LOCALIZATION Right 02/27/2016   Procedure: BREAST LUMPECTOMY WITH NEEDLE LOCALIZATION;  Surgeon: Louanne KANDICE Muse, MD;  Location: [facility] ORS;  Service: General;  Laterality: Right;   CARDIAC CATHETERIZATION     CARDIAC CATHETERIZATION Right 12/07/2014   Procedure: Right and Left Heart Cath with Coronary Angiography;  Surgeon: Vinie DELENA Jude, MD;  Location: [facility] INVASIVE CV LAB;  Service: Cardiovascular;  Laterality: Right;   CARPAL TUNNEL RELEASE  1998  and 1999   CATARACT EXTRACTION W/PHACO Right 09/24/2019   Procedure: CATARACT EXTRACTION PHACO AND INTRAOCULAR LENS PLACEMENT (IOC) RIGHT;  Surgeon: Jaye Fallow, MD;  Location: [facility] ORS;  Service: Ophthalmology;  Laterality: Right;  [id number]:44.8 us6.96 [id number] h   COLONOSCOPY  10/31/1998   DILATION AND CURETTAGE OF UTERUS     FOOT SURGERY Right    HYSTEROSCOPY     JOINT REPLACEMENT Left    KNEE ARTHROSCOPY AND ARTHROTOMY Right    lt knee replacement     TONSILLECTOMY     UVULOPALATOPHARYNGOPLASTY     Allergies  Allergen Reactions   Ace Inhibitors Cough   Nyquil Multi-Symptom [Pseudoeph-Doxylamine-Dm-Apap] Itching   Tylenol  [Acetaminophen ] Itching   Current Outpatient Medications on File Prior to Visit  Medication Sig Dispense Refill  albuterol  (VENTOLIN  HFA) 108 (90 Base) MCG/ACT inhaler INHALE 1 TO 2 PUFFS INTO THE LUNGS EVERY 6 HOURS AS NEEDED 8.5 g 0   ambrisentan  (LETAIRIS ) 10 MG tablet Take 10 mg by mouth daily.     atorvastatin  (LIPITOR) 20 MG tablet TAKE 1 TABLET(20 MG) BY MOUTH DAILY 100 tablet 2   cyclobenzaprine  (FLEXERIL ) 10 MG tablet Take 1 tablet (10 mg total) by mouth 3 (three) times daily as needed for muscle spasms. 30 tablet 0   ELIQUIS  5 MG TABS tablet TAKE 1 TABLET(5 MG) BY MOUTH TWICE DAILY 180 tablet 1   ezetimibe  (ZETIA ) 10 MG tablet TAKE 1 TABLET(10 MG) BY MOUTH  DAILY 90 tablet 0   furosemide  (LASIX ) 40 MG tablet TAKE 1 TABLET(40 MG) BY MOUTH DAILY 90 tablet 1   gabapentin  (NEURONTIN ) 300 MG capsule Take 2 capsules at bedtime. 180 capsule 3   JARDIANCE  10 MG TABS tablet TAKE 1 TABLET(10 MG) BY MOUTH DAILY 90 tablet 1   labetalol  (NORMODYNE ) 200 MG tablet Take 1 tablet (200 mg total) by mouth 2 (two) times daily.     lidocaine  (HM LIDOCAINE  PATCH) 4 % Place 1 patch onto the skin daily. 30 patch 1   meloxicam  (MOBIC ) 15 MG tablet Take 1 tablet (15 mg total) by mouth daily. 30 tablet 0   olmesartan  (BENICAR ) 20 MG tablet Take 1 tablet (20 mg total) by mouth daily. 90 tablet 1   OXYGEN  Inhale into the lungs daily. 3mL continuously daily.     pantoprazole  (PROTONIX ) 40 MG tablet TAKE 1 TABLET BY MOUTH EVERY DAY 180 tablet 1   polyethylene glycol powder (GLYCOLAX /MIRALAX ) 17 GM/SCOOP powder Take 17 g by mouth 2 (two) times daily as needed. Dissolve 1 capful (17g) in 4-8 ounces of liquid and take by mouth daily. 3350 g 1   Psyllium (METAMUCIL FIBER PO) Take 1 Dose by mouth as needed. Patient uses Metamucil Powder in the AM     senna (SENOKOT) 8.6 MG tablet Take 1 tablet (8.6 mg total) by mouth daily. 30 tablet 0   spironolactone (ALDACTONE) 25 MG tablet Take 25 mg by mouth daily.     No current facility-administered medications on file prior to visit.   Social History   Tobacco Use   Smoking status: Former    Current packs/day: 0.00    Types: Cigarettes    Quit date: 06/15/2014    Years since quitting: 9.6   Smokeless tobacco: Never   Tobacco comments:    4-5 cigarettes a day when she smoked  Vaping Use   Vaping status: Never Used  Substance Use Topics   Alcohol use: Yes    Alcohol/week: 0.0 - 1.0 standard drinks of alcohol    Comment: occasionally   Drug use: No      I have reviewed past medical, surgical, social and family history, medications and allergies as documented in the EMR.  Review of Systems - A ROS was performed including  pertinent positives and negatives as documented in the HPI.     Physical Exam:  General/Constitutional: NAD Vascular: No edema, swelling or tenderness, except as noted in detailed exam Integumentary: No impressive skin lesions present, except as noted in detailed exam Neuro/Psych: Normal mood and affect, oriented to person, place and time Musculoskeletal: Normal, except as noted in detailed exam and in HPI   Focused examination:  Neck focused exam: Palpation: Mild tender to palpation about the mid-line spine and paraspinal musculature bilaterally adjacent to the midline spine along the  course of the neck.  Tenderness about trapezial region on right and periscapular region. ROM: Limitations in flexion, extension, sidebending and rotation.  Pain worse with rotation and sidebending to the right. Spurling's negative   Shoulder focused exam:    RIGHT  Scapula Atrophy None   Winging None  Rotator cuff Supraspinatus 5/5   Infraspinatus 5/5   Subscapularis 5/5  AROM/PROM (degrees) FF 0-170 / 0-170   ER0 0-60 / 0-60  Palpation (pain): AC negative   Biceps negative   Coracoid negative  Special Tests: O'Briens negative   Speeds  negative   Jobe's negative   Neer negative   Hawkins negative   Belly Press negative   Lateral deltoid 5/5    Vascular/Lymphatic: Fingers warm and well perfused with 2+ radial pulse.    Neurologic: Sensation intact to the Median, Ulnar and Radial nerve distribution of the hand. Sensation intact to lateral deltoid (axillary nerve).      XR cervical spine imaging: X-rays of the cervical spine including AP and lateral views were obtained in office today and reviewed with patient.  Per my interpretation, there are no obvious fractures or dislocations.  Diffuse degenerative disc disease noted within the cervical spine with endplate changes and osteophyte formation.  Loss of vertebral body height most notable at C6. Facet arthrosis also noted  diffusely  X-ray right shoulder imaging: X-rays of the right shoulder including AP, Grashey, scapular Y, axillary were obtained in office today and reviewed with patient.  Per my interpretation, there are no obvious fractures or dislocations.  Moderate concentric glenohumeral arthritis with cyst formation in the humeral head.  Diastases noted at the acromioclavicular joint with degenerative changes.    Assessment:  Cervical spondylosis with radicular symptoms Right shoulder glenohumeral arthritis  Plan:  Patient was seen and examined in office today. We reviewed patient's history, examination, and imaging in detail. Based on information available for this encounter, patient symptomatic from cervical spondylosis as well as right shoulder glenohumeral arthritis.  Her examination demonstrates very good motor strength and range of motion in her shoulder with occasional pain emanating from the glenohumeral joint.  Her most provoking symptoms seem to be stemming from her cervical spine with radiographic evidence of diffuse degenerative changes as well as pain with range of motion and paraspinal/trapezial pain.  We discussed conservative treatment measures today including oral anti-inflammatory medication, consideration of steroid injections and formal physical therapy.  Her medical comorbidities prohibit regular use of oral anti-inflammatory medication.  She is not interested in steroid injections today as well.  She would like to start with formal physical therapy.  Based on her limited ambulation abilities as well as difficulties with transportation, we have suggested home health physical therapy to aid in working on her cervical spine and right shoulder.  Notified patient if she does not hear back from home health in several weeks to notify our office as we may consider outpatient physical therapy at that time.  Patient understands and is in agreement with plan.  We will plan on seeing her back in 8 weeks  after the completion of physical therapy or sooner if needed.   Patient education material was provided.  All questions, concerns and comments were addressed to the best of my ability.  Follow-up: 8 weeks   Arlyss GEANNIE Schneider, DO Orthopedic Surgery & Sports Medicine [place]   This document was dictated using Dragon voice recognition software. A reasonable attempt at proof reading has been made to minimize errors.

## 2024-02-11 NOTE — Telephone Encounter (Signed)
 Requested by interface surescripts. Future visit 06/23/23.  Requested Prescriptions  Pending Prescriptions Disp Refills   ELIQUIS  5 MG TABS tablet [Pharmacy Med Name: ELIQUIS  5MG  TABLETS] 180 tablet 1    Sig: TAKE 1 TABLET(5 MG) BY MOUTH TWICE DAILY     Hematology:  Anticoagulants - apixaban  Failed - 02/11/2024 11:05 AM      Failed - Cr in normal range and within 360 days    Creatinine, Ser  Date Value Ref Range Status  12/24/2023 1.26 (H) 0.57 - 1.00 mg/dL Final         Passed - PLT in normal range and within 360 days    Platelets  Date Value Ref Range Status  06/17/2023 170 150 - 400 K/uL Final  03/04/2023 146 (L) 150 - 450 x10E3/uL Final         Passed - HGB in normal range and within 360 days    Hemoglobin  Date Value Ref Range Status  06/17/2023 12.2 12.0 - 15.0 g/dL Final  88/81/7975 88.8 11.1 - 15.9 g/dL Final         Passed - HCT in normal range and within 360 days    HCT  Date Value Ref Range Status  06/17/2023 36.9 36.0 - 46.0 % Final   Hematocrit  Date Value Ref Range Status  03/04/2023 34.4 34.0 - 46.6 % Final         Passed - AST in normal range and within 360 days    AST  Date Value Ref Range Status  12/24/2023 19 0 - 40 IU/L Final         Passed - ALT in normal range and within 360 days    ALT  Date Value Ref Range Status  12/24/2023 7 0 - 32 IU/L Final         Passed - Valid encounter within last 12 months    Recent Outpatient Visits           2 weeks ago Chronic left shoulder pain   [place] [facility] Melvin Pao, NP   1 month ago Neck pain   [place] [facility] Herold Hadassah SQUIBB, MD   1 month ago Primary pulmonary hypertension [facility])   [place] [facility] Melvin Pao, NP   5 months ago Muscle spasm   [place] [facility]vin Pao, NP   5 months ago Primary pulmonary hypertension [facility])   [place] [facility] Melvin Pao, NP        Future Appointments             In 1 month Gust Molly, DO [facility]             pantoprazole  (PROTONIX ) 40 MG tablet [Pharmacy Med Name: PANTOPRAZOLE  40MG  TABLETS] 180 tablet 1    Sig: TAKE 1 TABLET BY MOUTH EVERY DAY     Gastroenterology: Proton Pump Inhibitors Passed - 02/11/2024 11:05 AM      Passed - Valid encounter within last 12 months    Recent Outpatient Visits           2 weeks ago Chronic left shoulder pain   [place] [facility] Melvin Pao, NP   1 month ago Neck pain   [place] [facility] Herold Hadassah SQUIBB, MD   1 month ago Primary pulmonary hypertension [facility])   [place] [facility] Melvin Pao, NP   5 months ago Muscle spasm  [place] [facility] Melvin Pao, NP   5 months ago Primary pulmonary hypertension [facility])   [place] [facility] Melvin Pao, NP       Future Appointments             In 1 month Gust Molly, DO [place] [facility]             olmesartan  (BENICAR ) 20 MG tablet [Pharmacy Med Name: OLMESARTAN  MEDOXOMIL 20MG  TABLETS] 90 tablet 0    Sig: TAKE 1 TABLET(20 MG) BY MOUTH DAILY     Cardiovascular:  Angiotensin Receptor Blockers Failed - 02/11/2024 11:05 AM      Failed - Cr in normal range and within 180 days    Creatinine, Ser  Date Value Ref Range Status  12/24/2023 1.26 (H) 0.57 - 1.00 mg/dL Final         Passed - K in normal range and within 180 days    Potassium  Date Value Ref Range Status  12/24/2023 3.8 3.5 - 5.2 mmol/L Final         Passed - Patient is not pregnant      Passed - Last BP in normal range    BP Readings from Last 1 Encounters:  01/24/24 134/82         Passed - Valid encounter within last 6 months    Recent Outpatient Visits           2 weeks ago Chronic left shoulder pain   [place] [facility] Melvin Pao, NP   1 month ago Neck pain    [place] [facility] Herold Hadassah SQUIBB, MD   1 month ago Primary pulmonary hypertension [facility])   [place] [facility] Melvin Pao, NP   5 months ago Muscle spasm   [place] [facility] Melvin Pao, NP   5 months ago Primary pulmonary hypertension [facility])   [place] [facility] Melvin Pao, NP       Future Appointments             In 1 month Gust Molly, DO [place] [facility]

## 2024-02-11 NOTE — Telephone Encounter (Signed)
 Too soon for refill.  Requested Prescriptions  Pending Prescriptions Disp Refills   atorvastatin  (LIPITOR) 20 MG tablet [Pharmacy Med Name: ATORVASTATIN  20MG  TABLETS] 90 tablet     Sig: TAKE 1 TABLET BY MOUTH DAILY     Cardiovascular:  Antilipid - Statins Failed - 02/11/2024  9:38 AM      Failed - Lipid Panel in normal range within the last 12 months    Cholesterol, Total  Date Value Ref Range Status  12/24/2023 168 100 - 199 mg/dL Final   Cholesterol Piccolo, Waived  Date Value Ref Range Status  09/05/2016 154 <200 mg/dL Final    Comment:                            Desirable                <200                         Borderline High      200- 239                         High                     >239    LDL Chol Calc (NIH)  Date Value Ref Range Status  12/24/2023 76 0 - 99 mg/dL Final   HDL  Date Value Ref Range Status  12/24/2023 81 >39 mg/dL Final   Triglycerides  Date Value Ref Range Status  12/24/2023 52 0 - 149 mg/dL Final   Triglycerides Piccolo,Waived  Date Value Ref Range Status  09/05/2016 46 <150 mg/dL Final    Comment:                            Normal                   <150                         Borderline High     150 - 199                         High                200 - 499                         Very High                >499          Passed - Patient is not pregnant      Passed - Valid encounter within last 12 months    Recent Outpatient Visits           2 weeks ago Chronic left shoulder pain   [place] [facility] Melvin Pao, NP   1 month ago Neck pain   [place] [facility] Herold Hadassah SQUIBB, MD   1 month ago Primary pulmonary hypertension [facility])   [place] [facility] Melvin Pao, NP   5 months ago Muscle spasm   [place] [facility] Melvin Pao, NP   5 months ago Primary pulmonary hypertension [facility])   [place] [facility] Melvin Pao, NP       Future Appointments             In 1 month Gust Molly, DO [place] [facility]

## 2024-02-13 ENCOUNTER — Telehealth: Payer: Self-pay | Admitting: Nurse Practitioner

## 2024-02-13 ENCOUNTER — Other Ambulatory Visit: Payer: Self-pay | Admitting: Pediatrics

## 2024-02-13 DIAGNOSIS — M542 Cervicalgia: Secondary | ICD-10-CM

## 2024-02-13 NOTE — Telephone Encounter (Unsigned)
 Copied from CRM [id number]. Topic: Clinical - Order For Equipment >> Feb 13, 2024 11:57 AM Amy B wrote: [employer] called again stating that they still have not received the paperwork for the patient's knee brace.  Please advise.

## 2024-02-14 ENCOUNTER — Telehealth: Payer: Self-pay

## 2024-02-14 ENCOUNTER — Telehealth: Payer: Self-pay | Admitting: Nurse Practitioner

## 2024-02-14 NOTE — Telephone Encounter (Signed)
Paperwork has been signed and faxed back  

## 2024-02-14 NOTE — Telephone Encounter (Signed)
 Requested Prescriptions  Pending Prescriptions Disp Refills   meloxicam  (MOBIC ) 15 MG tablet [Pharmacy Med Name: MELOXICAM  15MG  TABLETS] 90 tablet 0    Sig: TAKE 1 TABLET(15 MG) BY MOUTH DAILY     Analgesics:  COX2 Inhibitors Failed - 02/14/2024  3:59 PM      Failed - Manual Review: Labs are only required if the patient has taken medication for more than 8 weeks.      Failed - Cr in normal range and within 360 days    Creatinine, Ser  Date Value Ref Range Status  12/24/2023 1.26 (H) 0.57 - 1.00 mg/dL Final         Passed - HGB in normal range and within 360 days    Hemoglobin  Date Value Ref Range Status  06/17/2023 12.2 12.0 - 15.0 g/dL Final  88/81/7975 88.8 11.1 - 15.9 g/dL Final         Passed - HCT in normal range and within 360 days    HCT  Date Value Ref Range Status  06/17/2023 36.9 36.0 - 46.0 % Final   Hematocrit  Date Value Ref Range Status  03/04/2023 34.4 34.0 - 46.6 % Final         Passed - AST in normal range and within 360 days    AST  Date Value Ref Range Status  12/24/2023 19 0 - 40 IU/L Final         Passed - ALT in normal range and within 360 days    ALT  Date Value Ref Range Status  12/24/2023 7 0 - 32 IU/L Final         Passed - eGFR is 30 or above and within 360 days    GFR calc Af Amer  Date Value Ref Range Status  01/28/2020 53 (L) >59 mL/min/1.73 Final    Comment:    **In accordance with recommendations from the NKF-ASN Task force,**   [facility] is in the process of updating its eGFR calculation to the   2021 CKD-EPI creatinine equation that estimates kidney function   without a race variable.    GFR, Estimated  Date Value Ref Range Status  06/17/2023 41 (L) >60 mL/min Final    Comment:    (NOTE) Calculated using the CKD-EPI Creatinine Equation (2021)    eGFR  Date Value Ref Range Status  12/24/2023 43 (L) >59 mL/min/1.73 Final         Passed - Patient is not pregnant      Passed - Valid encounter within last 12 months     Recent Outpatient Visits           3 weeks ago Chronic left shoulder pain   [place] [facility] Melvin Pao, NP   1 month ago Neck pain   [place] [facility] Herold Hadassah SQUIBB, MD   1 month ago Primary pulmonary hypertension [facility])   [place] [facility] Melvin Pao, NP   5 months ago Muscle spasm   [place] [facility] Melvin Pao, NP   5 months ago Primary pulmonary hypertension [facility])   [place] [facility] Melvin Pao, NP       Future Appointments             In 1 month Gust Molly, DO [place] [facility]

## 2024-02-14 NOTE — Telephone Encounter (Unsigned)
 Copied from CRM [id number]. Topic: General - Other >> Feb 14, 2024 11:08 AM Donee H wrote: Reason for CRM: Serenity from [employer] called to check status on request fro knee brace for patient. She would like to know a timeframe off when to expect form to be completed and sent back over. She states this need to be done as soon as possible due to waiting for about 2 weeks now. Please follow up with her at [phone]

## 2024-02-14 NOTE — Telephone Encounter (Signed)
 FYI

## 2024-02-14 NOTE — Telephone Encounter (Signed)
 Jennifer from [facility] called and wants to let you know that they started the home health PT for 2 wks 2, 1 wk 4. [phone]

## 2024-02-17 ENCOUNTER — Encounter: Payer: Self-pay | Admitting: Radiology

## 2024-02-25 ENCOUNTER — Other Ambulatory Visit: Payer: Self-pay | Admitting: Nurse Practitioner

## 2024-02-27 NOTE — Telephone Encounter (Signed)
 Requested Prescriptions  Pending Prescriptions Disp Refills   ezetimibe  (ZETIA ) 10 MG tablet [Pharmacy Med Name: EZETIMIBE  10MG  TABLETS] 90 tablet 0    Sig: TAKE 1 TABLET(10 MG) BY MOUTH DAILY     Cardiovascular:  Antilipid - Sterol Transport Inhibitors Failed - 02/27/2024 12:15 PM      Failed - Lipid Panel in normal range within the last 12 months    Cholesterol, Total  Date Value Ref Range Status  12/24/2023 168 100 - 199 mg/dL Final   Cholesterol Piccolo, Waived  Date Value Ref Range Status  09/05/2016 154 <200 mg/dL Final    Comment:                            Desirable                <200                         Borderline High      200- 239                         High                     >239    LDL Chol Calc (NIH)  Date Value Ref Range Status  12/24/2023 76 0 - 99 mg/dL Final   HDL  Date Value Ref Range Status  12/24/2023 81 >39 mg/dL Final   Triglycerides  Date Value Ref Range Status  12/24/2023 52 0 - 149 mg/dL Final   Triglycerides Piccolo,Waived  Date Value Ref Range Status  09/05/2016 46 <150 mg/dL Final    Comment:                            Normal                   <150                         Borderline High     150 - 199                         High                200 - 499                         Very High                >499          Passed - AST in normal range and within 360 days    AST  Date Value Ref Range Status  12/24/2023 19 0 - 40 IU/L Final         Passed - ALT in normal range and within 360 days    ALT  Date Value Ref Range Status  12/24/2023 7 0 - 32 IU/L Final         Passed - Patient is not pregnant      Passed - Valid encounter within last 12 months    Recent Outpatient Visits           1 month ago Chronic left shoulder pain   [place] [facility] [place], Darice,  NP   1 month ago Neck pain   [place] [facility] Herold Hadassah SQUIBB, MD   2 months ago Primary pulmonary hypertension [facility])    [place] [facility] Melvin Pao, NP   5 months ago Muscle spasm   [place] [facility] Pao, NP   6 months ago Primary pulmonary hypertension [facility])   [place] [facility] Melvin Pao, NP       Future Appointments             In 1 month Gust Molly, DO [place] [facility]             labetalol  (NORMODYNE ) 200 MG tablet [Pharmacy Med Name: LABETALOL  200MG  TABLETS] 180 tablet 0    Sig: TAKE 1 TABLET BY MOUTH TWICE DAILY     Cardiovascular:  Beta Blockers Passed - 02/27/2024 12:15 PM      Passed - Last BP in normal range    BP Readings from Last 1 Encounters:  01/24/24 134/82         Passed - Last Heart Rate in normal range    Pulse Readings from Last 1 Encounters:  01/24/24 75         Passed - Valid encounter within last 6 months    Recent Outpatient Visits           1 month ago Chronic left shoulder pain   [place] [facility] Melvin Pao, NP   1 month ago Neck pain   [place] [facility] Herold Hadassah SQUIBB, MD   2 months ago Primary pulmonary hypertension [facility])   [place] [facility] Melvin Pao, NP   5 months ago Muscle spasm   [place] [facility] Melvin Pao, NP   6 months ago Primary pulmonary hypertension [facility])   [place] [facility] Melvin Pao, NP       Future Appointments             In 1 month Gust Molly, DO [place] [facility]

## 2024-02-27 NOTE — Telephone Encounter (Signed)
 Requested medications are due for refill today.  unsure  Requested medications are on the active medications list.  yes  Last refill. 08/23/2023  - unknown quantity  Future visit scheduled.   yes  Notes to clinic.  Please review for refill.    Requested Prescriptions  Pending Prescriptions Disp Refills   labetalol  (NORMODYNE ) 200 MG tablet [Pharmacy Med Name: LABETALOL  200MG  TABLETS] 180 tablet     Sig: TAKE 1 TABLET BY MOUTH TWICE DAILY     Cardiovascular:  Beta Blockers Passed - 02/27/2024 12:03 PM      Passed - Last BP in normal range    BP Readings from Last 1 Encounters:  01/24/24 134/82         Passed - Last Heart Rate in normal range    Pulse Readings from Last 1 Encounters:  01/24/24 75         Passed - Valid encounter within last 6 months    Recent Outpatient Visits           1 month ago Chronic left shoulder pain   [place] [facility] Melvin Pao, NP   1 month ago Neck pain   [place] [facility] Herold Hadassah SQUIBB, MD   2 months ago Primary pulmonary hypertension [facility])   [place] [facility] Melvin Pao, NP   5 months ago Muscle spasm   [place] [facility] Melvin Pao, NP   6 months ago Primary pulmonary hypertension [facility])   [place] [facility] Pao, NP       Future Appointments             In 1 month Gust Molly, DO [place] [facility]

## 2024-03-31 ENCOUNTER — Other Ambulatory Visit: Payer: Self-pay | Admitting: Nurse Practitioner

## 2024-03-31 DIAGNOSIS — Z1231 Encounter for screening mammogram for malignant neoplasm of breast: Secondary | ICD-10-CM

## 2024-04-06 ENCOUNTER — Ambulatory Visit

## 2024-04-17 ENCOUNTER — Encounter: Payer: Self-pay | Admitting: Nurse Practitioner

## 2024-04-17 ENCOUNTER — Ambulatory Visit (INDEPENDENT_AMBULATORY_CARE_PROVIDER_SITE_OTHER): Admitting: Nurse Practitioner

## 2024-04-17 VITALS — BP 146/85 | HR 91 | Temp 98.7°F | Ht 65.0 in | Wt 289.4 lb

## 2024-04-17 DIAGNOSIS — R051 Acute cough: Secondary | ICD-10-CM | POA: Diagnosis not present

## 2024-04-17 MED ORDER — AMOXICILLIN 500 MG PO CAPS: 500.0000 mg | ORAL_CAPSULE | Freq: Two times a day (BID) | ORAL | 0 refills | Status: AC

## 2024-04-17 NOTE — Progress Notes (Signed)
 "  BP (!) 146/85 (BP Location: Right Arm, Cuff Size: Large)   Pulse 91   Temp 98.7 F (37.1 C) (Oral)   Ht 5' 5 (1.651 m)   Wt 289 lb 6.4 oz (131.3 kg)   SpO2 92%   BMI 48.16 kg/m    Subjective:    Patient ID: Michelle Jenkins, female    DOB: [birth year], 79 y.o.   MRN: [id number]  HPI: Michelle Jenkins is a 78 y.o. female  Chief Complaint  Patient presents with   dry mouth    Issues with Cpap machine. Patient stated she's been having dry mouth, sneezing, and coughing for the past 3 weeks. She got a sleep apnea machine and she believes the machine irritated her because there was a weird smell coming out of it.    Patient states she has been having sneezing, coughing, congestion.  Her daughter came home from [place]  and after they left she started having symptoms.  States she has waited it out and trying to get it to go away but hasn't resolved.  No fevers, SOB, wheezing or fatigue.   Relevant past medical, surgical, family and social history reviewed and updated as indicated. Interim medical history since our last visit reviewed. Allergies and medications reviewed and updated.  Review of Systems  Constitutional:  Negative for fatigue and fever.  HENT:  Positive for congestion and sneezing.   Respiratory:  Positive for cough. Negative for shortness of breath and wheezing.     Per HPI unless specifically indicated above     Objective:    BP (!) 146/85 (BP Location: Right Arm, Cuff Size: Large)   Pulse 91   Temp 98.7 F (37.1 C) (Oral)   Ht 5' 5 (1.651 m)   Wt 289 lb 6.4 oz (131.3 kg)   SpO2 92%   BMI 48.16 kg/m   Wt Readings from Last 3 Encounters:  04/17/24 289 lb 6.4 oz (131.3 kg)  01/24/24 293 lb (132.9 kg)  01/10/24 287 lb (130.2 kg)    Physical Exam Vitals and nursing note reviewed.  Constitutional:      General: She is not in acute distress.    Appearance: Normal appearance. She is normal weight. She is not ill-appearing, toxic-appearing or diaphoretic.   HENT:     Head: Normocephalic.     Right Ear: External ear normal. A middle ear effusion is present.     Left Ear: External ear normal. A middle ear effusion is present.     Nose: Congestion and rhinorrhea present.     Mouth/Throat:     Mouth: Mucous membranes are moist.     Pharynx: Oropharynx is clear. Posterior oropharyngeal erythema present. No oropharyngeal exudate.  Eyes:     General:        Right eye: No discharge.        Left eye: No discharge.     Extraocular Movements: Extraocular movements intact.     Conjunctiva/sclera: Conjunctivae normal.     Pupils: Pupils are equal, round, and reactive to light.  Cardiovascular:     Rate and Rhythm: Normal rate and regular rhythm.     Heart sounds: No murmur heard. Pulmonary:     Effort: Pulmonary effort is normal. No respiratory distress.     Breath sounds: Normal breath sounds. No wheezing or rales.  Musculoskeletal:     Cervical back: Normal range of motion and neck supple.  Skin:    General: Skin is warm and dry.  Capillary Refill: Capillary refill takes less than 2 seconds.  Neurological:     General: No focal deficit present.     Mental Status: She is alert and oriented to person, place, and time. Mental status is at baseline.  Psychiatric:        Mood and Affect: Mood normal.        Behavior: Behavior normal.        Thought Content: Thought content normal.        Judgment: Judgment normal.     Results for orders placed or performed in visit on 12/24/23  Comprehensive metabolic panel with GFR   Collection Time: 12/24/23  9:50 AM  Result Value Ref Range   Glucose 97 70 - 99 mg/dL   BUN 26 8 - 27 mg/dL   Creatinine, Ser 8.73 (H) 0.57 - 1.00 mg/dL   eGFR 43 (L) >40 fO/fpw/8.26   BUN/Creatinine Ratio 21 12 - 28   Sodium 141 134 - 144 mmol/L   Potassium 3.8 3.5 - 5.2 mmol/L   Chloride 104 96 - 106 mmol/L   CO2 25 20 - 29 mmol/L   Calcium  9.5 8.7 - 10.3 mg/dL   Total Protein 6.7 6.0 - 8.5 g/dL   Albumin 4.4 3.8  - 4.8 g/dL   Globulin, Total 2.3 1.5 - 4.5 g/dL   Bilirubin Total 0.7 0.0 - 1.2 mg/dL   Alkaline Phosphatase 73 44 - 121 IU/L   AST 19 0 - 40 IU/L   ALT 7 0 - 32 IU/L  Hemoglobin A1c   Collection Time: 12/24/23  9:50 AM  Result Value Ref Range   Hgb A1c MFr Bld 5.8 (H) 4.8 - 5.6 %   Est. average glucose Bld gHb Est-mCnc 120 mg/dL  Lipid panel   Collection Time: 12/24/23  9:50 AM  Result Value Ref Range   Cholesterol, Total 168 100 - 199 mg/dL   Triglycerides 52 0 - 149 mg/dL   HDL 81 >60 mg/dL   VLDL Cholesterol Cal 11 5 - 40 mg/dL   LDL Chol Calc (NIH) 76 0 - 99 mg/dL   Chol/HDL Ratio 2.1 0.0 - 4.4 ratio      Assessment & Plan:   Problem List Items Addressed This Visit   None Visit Diagnoses       Acute cough    -  Primary   Will treat with amoxicillin due to length of symptoms.  Complete course of medication.  Okay to use OTC symptom management.  Follow up if not improved.        Follow up plan: No follow-ups on file.      "

## 2024-04-18 ENCOUNTER — Other Ambulatory Visit: Payer: Self-pay | Admitting: Nurse Practitioner

## 2024-04-20 NOTE — Telephone Encounter (Signed)
 Requested Prescriptions  Pending Prescriptions Disp Refills   JARDIANCE  10 MG TABS tablet [Pharmacy Med Name: JARDIANCE  10MG  TABLETS] 90 tablet 0    Sig: TAKE 1 TABLET(10 MG) BY MOUTH DAILY     Endocrinology:  Diabetes - SGLT2 Inhibitors Failed - 04/20/2024  4:02 PM      Failed - Cr in normal range and within 360 days    Creatinine, Ser  Date Value Ref Range Status  12/24/2023 1.26 (H) 0.57 - 1.00 mg/dL Final         Failed - eGFR in normal range and within 360 days    GFR calc Af Amer  Date Value Ref Range Status  01/28/2020 53 (L) >59 mL/min/1.73 Final    Comment:    **In accordance with recommendations from the NKF-ASN Task force,**   [facility] is in the process of updating its eGFR calculation to the   2021 CKD-EPI creatinine equation that estimates kidney function   without a race variable.    GFR, Estimated  Date Value Ref Range Status  06/17/2023 41 (L) >60 mL/min Final    Comment:    (NOTE) Calculated using the CKD-EPI Creatinine Equation (2021)    eGFR  Date Value Ref Range Status  12/24/2023 43 (L) >59 mL/min/1.73 Final         Passed - HBA1C is between 0 and 7.9 and within 180 days    Hgb A1c MFr Bld  Date Value Ref Range Status  12/24/2023 5.8 (H) 4.8 - 5.6 % Final    Comment:             Prediabetes: 5.7 - 6.4          Diabetes: >6.4          Glycemic control for adults with diabetes: <7.0          Passed - Valid encounter within last 6 months    Recent Outpatient Visits           3 days ago Acute cough   [place] [facility], [facility]   2 months ago Chronic left shoulder pain   [facility] [facility], NP   3 months ago Neck pain   [place] [facility] Herold Hadassah SQUIBB, MD   3 months ago Primary pulmonary hypertension [facility])   [place] [facility] Melvin Pao, NP   7 months ago Muscle spasm   [place] [facility] Melvin Pao, NP                furosemide  (LASIX ) 40 MG tablet [Pharmacy Med Name: FUROSEMIDE  40MG  TABLETS] 90 tablet 0    Sig: TAKE 1 TABLET(40 MG) BY MOUTH DAILY     Cardiovascular:  Diuretics - Loop Failed - 04/20/2024  4:02 PM      Failed - Cr in normal range and within 180 days    Creatinine, Ser  Date Value Ref Range Status  12/24/2023 1.26 (H) 0.57 - 1.00 mg/dL Final         Failed - Mg Level in normal range and within 180 days    Magnesium  Date Value Ref Range Status  03/26/2023 2.3 1.7 - 2.4 mg/dL Final    Comment:    Performed at [facility], [address] [place], [place] [address]         Failed - Last BP in normal range    BP Readings from Last 1 Encounters:  04/17/24 (!) 146/85         Passed - K in normal range and within 180 days    Potassium  Date Value Ref Range Status  12/24/2023 3.8 3.5 - 5.2 mmol/L Final         Passed - Ca in normal range and within 180 days    Calcium   Date Value Ref Range Status  12/24/2023 9.5 8.7 - 10.3 mg/dL Final         Passed - Na in normal range and within 180 days    Sodium  Date Value Ref Range Status  12/24/2023 141 134 - 144 mmol/L Final         Passed - Cl in normal range and within 180 days    Chloride  Date Value Ref Range Status  12/24/2023 104 96 - 106 mmol/L Final         Passed - Valid encounter within last 6 months    Recent Outpatient Visits           3 days ago Acute cough   [place] [facility] Melvin Pao, NP   2 months ago Chronic left shoulder pain   [place] [facility] Mel[facility] Pa[facility], NP   3 months ago Neck pain   [place] [facility] Herold Hadassah SQUIBB, MD   3 months ago Primary pulmonary hypertension [facility])   [place] [facility] Melvin Pao, NP   7 months ago Muscle spasm   [place] [facility] Melvin Pao, NP

## 2024-05-01 ENCOUNTER — Ambulatory Visit
Admission: RE | Admit: 2024-05-01 | Discharge: 2024-05-01 | Disposition: A | Discharge status: Home / Self Care | Source: Ambulatory Visit | Attending: Nurse Practitioner | Admitting: Nurse Practitioner

## 2024-05-01 DIAGNOSIS — Z1231 Encounter for screening mammogram for malignant neoplasm of breast: Secondary | ICD-10-CM | POA: Diagnosis present

## 2024-05-05 ENCOUNTER — Ambulatory Visit: Payer: Self-pay

## 2024-05-05 NOTE — Telephone Encounter (Signed)
 Scheduled for 11 am tomorrow by HIPOLITO.

## 2024-05-05 NOTE — Telephone Encounter (Signed)
" °  FYI Only or Action Required?: FYI only for provider: appointment scheduled on 05/06/24.  Patient was last seen in primary care on 04/17/2024 by Melvin Pao, NP.  Called Nurse Triage reporting URI.  Symptoms began 04/17/24.  Interventions attempted: Prescription medications: amoxicillin .  Symptoms are: gradually improving.  Triage Disposition: See PCP When Office is Open (Within 3 Days)  Patient/caregiver understands and will follow disposition?: Yes Reason for Disposition  [1] Finished taking antibiotics AND [2] symptoms are BETTER but [3] not completely gone  Answer Assessment - Initial Assessment Questions Pt states symptoms have not fully resolved after completing amoxicillin . Pt reports she continues to have mainly nasal congestion, but sneezing has resolved. Has not tried any OTC tx for congestion.   Note: pt was seen on 04/17/24 for acute cough and tx with Amoxicillin  Sig: Take 1 capsule (500 mg total) by mouth 2 (two) times daily for 10 days.  1. INFECTION: What infection is the antibiotic being given for?     Documented as acute cough 2. ANTIBIOTIC: What antibiotic are you taking How many times per day?     Amoxicillin  Sig: Take 1 capsule (500 mg total) by mouth 2 (two) times daily for 10 days 3. DURATION: When was the antibiotic started?     04/17/24 4. MAIN CONCERN OR SYMPTOM:  What is your main concern right now?     Continued nasal congestion 5. BETTER-SAME-WORSE: Are you getting better, staying the same, or getting worse compared to when you first started the antibiotics? If getting worse, ask: In what way?      Mild improvement 6. FEVER: Do you have a fever? If Yes, ask: What is your temperature, how was it measured, and when did it start?     Denies 7. SYMPTOMS: Are there any other symptoms you're concerned about? If Yes, ask: When did it start?     See above  Protocols used: Infection on Antibiotic Follow-up Call-A-AH  Copied from CRM  #[id number]. Topic: Clinical - Medical Advice >> May 05, 2024  1:48 PM Joesph B wrote: Reason for CRM: patent states she is still full of mucous after completing her antibiotics. She wants to know what she should do? Please fu with patient. "

## 2024-05-06 ENCOUNTER — Ambulatory Visit: Payer: Self-pay

## 2024-05-06 ENCOUNTER — Other Ambulatory Visit: Payer: Self-pay | Admitting: Nurse Practitioner

## 2024-05-06 ENCOUNTER — Ambulatory Visit: Admitting: Family Medicine

## 2024-05-06 ENCOUNTER — Encounter: Payer: Self-pay | Admitting: Family Medicine

## 2024-05-06 VITALS — BP 138/80 | HR 65 | Temp 97.6°F | Ht 65.0 in | Wt 288.0 lb

## 2024-05-06 DIAGNOSIS — J449 Chronic obstructive pulmonary disease, unspecified: Secondary | ICD-10-CM | POA: Diagnosis not present

## 2024-05-06 MED ORDER — ALBUTEROL SULFATE HFA 108 (90 BASE) MCG/ACT IN AERS: 1.0000 | INHALATION_SPRAY | Freq: Four times a day (QID) | RESPIRATORY_TRACT | 6 refills | Status: AC | PRN

## 2024-05-06 MED ORDER — SPACER/AERO-HOLDING CHAMBERS DEVI: 0 refills | Status: AC

## 2024-05-06 MED ORDER — BUDESONIDE-FORMOTEROL FUMARATE 160-4.5 MCG/ACT IN AERO: 2.0000 | INHALATION_SPRAY | Freq: Two times a day (BID) | RESPIRATORY_TRACT | 3 refills | Status: AC

## 2024-05-06 NOTE — Assessment & Plan Note (Signed)
 Lungs clear. Will start her on symbicort  with spacer. Due for follow up with PCP in 6 weeks- follow up with that then.

## 2024-05-06 NOTE — Telephone Encounter (Signed)
 Requested by interface surescripts. Future visit 06/22/24. Need new order for further refills.  Requested Prescriptions  Pending Prescriptions Disp Refills   gabapentin  (NEURONTIN ) 300 MG capsule [Pharmacy Med Name: GABAPENTIN  300MG  CAPSULES] 180 capsule 0    Sig: TAKE 2 CAPSULES BY MOUTH AT BEDTIME     Neurology: Anticonvulsants - gabapentin  Failed - 05/06/2024 12:28 PM      Failed - Cr in normal range and within 360 days    Creatinine, Ser  Date Value Ref Range Status  12/24/2023 1.26 (H) 0.57 - 1.00 mg/dL Final         Passed - Completed PHQ-2 or PHQ-9 in the last 360 days      Passed - Valid encounter within last 12 months    Recent Outpatient Visits           Today Chronic obstructive pulmonary disease, unspecified COPD type [facility])   [place] [facility], Megan P, DO   2 weeks ago Acute cough   [place] [facility] Melvin Pao, NP   3 months ago Chronic left shoulder pain   [place] [facility] Melvin Pao, NP   3 months ago Neck pain   [place] [facility] Herold Hadassah SQUIBB, MD   4 months ago Primary pulmonary hypertension [facility])   [place] [facility] Melvin Pao, NP

## 2024-05-06 NOTE — Progress Notes (Addendum)
 "  BP 138/80   Pulse 65   Temp 97.6 F (36.4 C) (Oral)   Ht 5' 5 (1.651 m)   Wt 288 lb (130.6 kg)   SpO2 93%   BMI 47.93 kg/m    Subjective:    Patient ID: Michelle Jenkins, female    DOB: 07-29-1945, 79 y.o.   MRN: [id number]  HPI: Michelle Jenkins is a 79 y.o. female  Chief Complaint  Patient presents with   Shortness of Breath    Onset 3 weeks ago. Feeling somewhat better. Cough gone however still having the nasal congestion. Patient is on 3L of O2   SHORTNESS OF BREATH Duration: 3 weeks- better after the antibiotics, but still feeling tight Onset: sudden Description of breathing discomfort: tight in her chest Severity: mild Episode duration: couple of seconds Related to exertion: no Cough: no Chest tightness: yes Wheezing: no Fevers: no Chest pain: no Palpitations: no  Nausea: no Diaphoresis: no Deconditioning: yes Status: better  Relevant past medical, surgical, family and social history reviewed and updated as indicated. Interim medical history since our last visit reviewed. Allergies and medications reviewed and updated.  Review of Systems  Constitutional: Negative.   HENT: Negative.    Respiratory:  Positive for shortness of breath. Negative for apnea, cough, choking, chest tightness, wheezing and stridor.   Cardiovascular: Negative.   Musculoskeletal: Negative.   Neurological: Negative.   Psychiatric/Behavioral: Negative.      Per HPI unless specifically indicated above     Objective:    BP 138/80   Pulse 65   Temp 97.6 F (36.4 C) (Oral)   Ht 5' 5 (1.651 m)   Wt 288 lb (130.6 kg)   SpO2 93%   BMI 47.93 kg/m   Wt Readings from Last 3 Encounters:  05/06/24 288 lb (130.6 kg)  04/17/24 289 lb 6.4 oz (131.3 kg)  01/24/24 293 lb (132.9 kg)    Physical Exam Vitals and nursing note reviewed.  Constitutional:      General: She is not in acute distress.    Appearance: Normal appearance. She is well-developed. She is obese. She is not  ill-appearing, toxic-appearing or diaphoretic.  HENT:     Head: Normocephalic and atraumatic.     Right Ear: External ear normal.     Left Ear: External ear normal.     Nose: Nose normal.     Mouth/Throat:     Mouth: Mucous membranes are moist.     Pharynx: Oropharynx is clear.  Eyes:     General: No scleral icterus.       Right eye: No discharge.        Left eye: No discharge.     Extraocular Movements: Extraocular movements intact.     Conjunctiva/sclera: Conjunctivae normal.     Pupils: Pupils are equal, round, and reactive to light.  Cardiovascular:     Rate and Rhythm: Normal rate and regular rhythm.     Pulses: Normal pulses.     Heart sounds: Normal heart sounds. No murmur heard.    No friction rub. No gallop.  Pulmonary:     Effort: Pulmonary effort is normal. No respiratory distress.     Breath sounds: Normal breath sounds. No stridor. No wheezing, rhonchi or rales.  Chest:     Chest wall: No tenderness.  Musculoskeletal:        General: Normal range of motion.     Cervical back: Normal range of motion and neck supple.  Skin:  General: Skin is warm and dry.     Capillary Refill: Capillary refill takes less than 2 seconds.     Coloration: Skin is not jaundiced or pale.     Findings: No bruising, erythema, lesion or rash.  Neurological:     General: No focal deficit present.     Mental Status: She is alert and oriented to person, place, and time. Mental status is at baseline.  Psychiatric:        Mood and Affect: Mood normal.        Behavior: Behavior normal.        Thought Content: Thought content normal.        Judgment: Judgment normal.     Results for orders placed or performed in visit on 12/24/23  Comprehensive metabolic panel with GFR   Collection Time: 12/24/23  9:50 AM  Result Value Ref Range   Glucose 97 70 - 99 mg/dL   BUN 26 8 - 27 mg/dL   Creatinine, Ser 8.73 (H) 0.57 - 1.00 mg/dL   eGFR 43 (L) >40 fO/fpw/8.26   BUN/Creatinine Ratio 21 12 -  28   Sodium 141 134 - 144 mmol/L   Potassium 3.8 3.5 - 5.2 mmol/L   Chloride 104 96 - 106 mmol/L   CO2 25 20 - 29 mmol/L   Calcium  9.5 8.7 - 10.3 mg/dL   Total Protein 6.7 6.0 - 8.5 g/dL   Albumin 4.4 3.8 - 4.8 g/dL   Globulin, Total 2.3 1.5 - 4.5 g/dL   Bilirubin Total 0.7 0.0 - 1.2 mg/dL   Alkaline Phosphatase 73 44 - 121 IU/L   AST 19 0 - 40 IU/L   ALT 7 0 - 32 IU/L  Hemoglobin A1c   Collection Time: 12/24/23  9:50 AM  Result Value Ref Range   Hgb A1c MFr Bld 5.8 (H) 4.8 - 5.6 %   Est. average glucose Bld gHb Est-mCnc 120 mg/dL  Lipid panel   Collection Time: 12/24/23  9:50 AM  Result Value Ref Range   Cholesterol, Total 168 100 - 199 mg/dL   Triglycerides 52 0 - 149 mg/dL   HDL 81 >60 mg/dL   VLDL Cholesterol Cal 11 5 - 40 mg/dL   LDL Chol Calc (NIH) 76 0 - 99 mg/dL   Chol/HDL Ratio 2.1 0.0 - 4.4 ratio      Assessment & Plan:   Problem List Items Addressed This Visit       Respiratory   COPD (chronic obstructive pulmonary disease) (HCC) - Primary   Lungs clear. Will start her on symbicort  with spacer. Due for follow up with PCP in 6 weeks- follow up with that then.       Relevant Medications   albuterol  (VENTOLIN  HFA) 108 (90 Base) MCG/ACT inhaler   budesonide -formoterol  (SYMBICORT ) 160-4.5 MCG/ACT inhaler     Follow up plan: Return for As scheduled.      "

## 2024-05-06 NOTE — Telephone Encounter (Signed)
 FYI Only or Action Required?: FYI only for provider: Info for appt today.  Patient was last seen in primary care on 04/17/2024 by Melvin Pao, NP.  Called Nurse Triage reporting Cough.  Symptoms began several days ago.  Interventions attempted: Rest, hydration, or home remedies.  Symptoms are: gradually improving.  Triage Disposition: See PCP When Office is Open (Within 3 Days)  Patient/caregiver understands and will follow disposition?: Yes       Reason for Disposition  [1] MODERATE longstanding difficulty breathing (e.g., speaks in phrases, SOB even at rest, pulse 100-120) AND [2] SAME as normal  Answer Assessment - Initial Assessment Questions Pt called to cancel appt since feeling better today. This RN could hear pt speaking in phrases, pt denies use of inhaler, advised pt use inhaler and come in for appt since pt states this is her usual state of SOB with her conditions and she did not know when to use the inhaler. This RN recommended pt come in for her previously scheduled appt this morning for exam and education about her inhaler. Pt states she will come to appt.     1. RESPIRATORY STATUS: Describe your breathing? (e.g., wheezing, shortness of breath, unable to speak, severe coughing)      Pt speaking in phrases, but states that she is having no more SOB than usual Was just sick with lots of coughing this past week but today feel clear-headed and not coughed at all this morning Breathing kind of tight but lung cancer, COPD Not using an inhaler since not sick or nothing  6. CARDIAC HISTORY: Do you have any history of heart disease? (e.g., heart attack, angina, bypass surgery, angioplasty)      Significant  7. LUNG HISTORY: Do you have any history of lung disease?  (e.g., pulmonary embolus, asthma, emphysema)     Significant  9. OTHER SYMPTOMS: Do you have any other symptoms? (e.g., chest pain, cough, dizziness, fever, runny nose)     Recent  illness  Denies: Worse SOB than usual Other symptoms  Protocols used: Breathing Difficulty-A-AH

## 2024-05-07 ENCOUNTER — Ambulatory Visit

## 2024-05-07 DIAGNOSIS — J449 Chronic obstructive pulmonary disease, unspecified: Secondary | ICD-10-CM

## 2024-05-07 NOTE — Progress Notes (Signed)
 Patient seen at the request of her PCP for aero chamber spacing device teaching. Patient brought her new aero chamber device and inhaler to appointment. Instructions provided to patient and she was able to repeat them back. Also allowed patient to demonstrate how to use the device on her own until she felt comfortable using.   With no further questions visit completed.

## 2024-05-14 ENCOUNTER — Other Ambulatory Visit: Payer: Self-pay | Admitting: Nurse Practitioner

## 2024-05-15 ENCOUNTER — Other Ambulatory Visit: Payer: Self-pay | Admitting: Nurse Practitioner

## 2024-05-15 DIAGNOSIS — M542 Cervicalgia: Secondary | ICD-10-CM

## 2024-05-15 NOTE — Telephone Encounter (Signed)
 Refilled 02/11/24 # 180 with 1 refill. Requested Prescriptions  Refused Prescriptions Disp Refills   pantoprazole  (PROTONIX ) 40 MG tablet [Pharmacy Med Name: PANTOPRAZOLE  40MG  TABLETS] 180 tablet 1    Sig: TAKE 1 TABLET BY MOUTH EVERY DAY     Gastroenterology: Proton Pump Inhibitors Passed - 05/15/2024  2:51 PM      Passed - Valid encounter within last 12 months    Recent Outpatient Visits           1 week ago Chronic obstructive pulmonary disease, unspecified COPD type (HCC)   [place] [facility] [place], Megan P, DO   4 weeks ago Acute cough   [place] [facility] Melvin Pao, NP   3 months ago Chronic left shoulder pain   [place] [facility] Melvin Pao, NP   4 months ago Neck pain   [place] [facility] Herold Hadassah SQUIBB, MD   4 months ago Primary pulmonary hypertension [facility])   [place] [facility] Melvin Pao, NP

## 2024-05-18 NOTE — Telephone Encounter (Signed)
 Requested by interface surescripts. Future visit 06/22/24. Requested Prescriptions  Pending Prescriptions Disp Refills   meloxicam  (MOBIC ) 15 MG tablet [Pharmacy Med Name: MELOXICAM  15MG  TABLETS] 90 tablet 0    Sig: TAKE 1 TABLET(15 MG) BY MOUTH DAILY     Analgesics:  COX2 Inhibitors Failed - 05/18/2024 10:42 AM      Failed - Manual Review: Labs are only required if the patient has taken medication for more than 8 weeks.      Failed - Cr in normal range and within 360 days    Creatinine, Ser  Date Value Ref Range Status  12/24/2023 1.26 (H) 0.57 - 1.00 mg/dL Final         Passed - HGB in normal range and within 360 days    Hemoglobin  Date Value Ref Range Status  06/17/2023 12.2 12.0 - 15.0 g/dL Final  88/81/7975 88.8 11.1 - 15.9 g/dL Final         Passed - HCT in normal range and within 360 days    HCT  Date Value Ref Range Status  06/17/2023 36.9 36.0 - 46.0 % Final   Hematocrit  Date Value Ref Range Status  03/04/2023 34.4 34.0 - 46.6 % Final         Passed - AST in normal range and within 360 days    AST  Date Value Ref Range Status  12/24/2023 19 0 - 40 IU/L Final         Passed - ALT in normal range and within 360 days    ALT  Date Value Ref Range Status  12/24/2023 7 0 - 32 IU/L Final         Passed - eGFR is 30 or above and within 360 days    GFR calc Af Amer  Date Value Ref Range Status  01/28/2020 53 (L) >59 mL/min/1.73 Final    Comment:    **In accordance with recommendations from the NKF-ASN Task force,**   [facility] is in the process of updating its eGFR calculation to the   2021 CKD-EPI creatinine equation that estimates kidney function   without a race variable.    GFR, Estimated  Date Value Ref Range Status  06/17/2023 41 (L) >60 mL/min Final    Comment:    (NOTE) Calculated using the CKD-EPI Creatinine Equation (2021)    eGFR  Date Value Ref Range Status  12/24/2023 43 (L) >59 mL/min/1.73 Final         Passed - Patient is not pregnant       Passed - Valid encounter within last 12 months    Recent Outpatient Visits           1 week ago Chronic obstructive pulmonary disease, unspecified COPD type (HCC)   [place] [facility] [place], Megan P, DO   1 month ago Acute cough   [place] [facility] Melvin Pao, NP   3 months ago Chronic left shoulder pain   [place] [facility] Melvin Pao, NP   4 months ago Neck pain   [place] [facility] Herold Hadassah SQUIBB, MD   4 months ago Primary pulmonary hypertension [facility])   [place] [facility] Melvin Pao, NP

## 2024-06-22 ENCOUNTER — Ambulatory Visit: Admitting: Nurse Practitioner
